# Patient Record
Sex: Female | Born: 1940
Health system: Southern US, Community
[De-identification: ages and names within clinical notes are randomized; demographics above are authoritative.]

## PROBLEM LIST (undated history)

## (undated) DIAGNOSIS — M545 Low back pain, unspecified: Secondary | ICD-10-CM

## (undated) DIAGNOSIS — Z5189 Encounter for other specified aftercare: Secondary | ICD-10-CM

## (undated) DIAGNOSIS — H269 Unspecified cataract: Secondary | ICD-10-CM

## (undated) DIAGNOSIS — T8859XA Other complications of anesthesia, initial encounter: Secondary | ICD-10-CM

## (undated) DIAGNOSIS — I493 Ventricular premature depolarization: Secondary | ICD-10-CM

## (undated) DIAGNOSIS — C801 Malignant (primary) neoplasm, unspecified: Secondary | ICD-10-CM

## (undated) DIAGNOSIS — H409 Unspecified glaucoma: Secondary | ICD-10-CM

## (undated) DIAGNOSIS — T4145XA Adverse effect of unspecified anesthetic, initial encounter: Secondary | ICD-10-CM

## (undated) DIAGNOSIS — K5730 Diverticulosis of large intestine without perforation or abscess without bleeding: Secondary | ICD-10-CM

## (undated) DIAGNOSIS — N816 Rectocele: Secondary | ICD-10-CM

## (undated) DIAGNOSIS — J189 Pneumonia, unspecified organism: Secondary | ICD-10-CM

## (undated) DIAGNOSIS — M8580 Other specified disorders of bone density and structure, unspecified site: Secondary | ICD-10-CM

## (undated) DIAGNOSIS — K5790 Diverticulosis of intestine, part unspecified, without perforation or abscess without bleeding: Secondary | ICD-10-CM

## (undated) DIAGNOSIS — M4306 Spondylolysis, lumbar region: Secondary | ICD-10-CM

## (undated) DIAGNOSIS — R51 Headache: Secondary | ICD-10-CM

## (undated) DIAGNOSIS — Z974 Presence of external hearing-aid: Secondary | ICD-10-CM

## (undated) DIAGNOSIS — F32A Depression, unspecified: Secondary | ICD-10-CM

## (undated) DIAGNOSIS — M1990 Unspecified osteoarthritis, unspecified site: Secondary | ICD-10-CM

## (undated) DIAGNOSIS — F329 Major depressive disorder, single episode, unspecified: Secondary | ICD-10-CM

## (undated) DIAGNOSIS — D126 Benign neoplasm of colon, unspecified: Secondary | ICD-10-CM

## (undated) DIAGNOSIS — R011 Cardiac murmur, unspecified: Secondary | ICD-10-CM

## (undated) DIAGNOSIS — T7840XA Allergy, unspecified, initial encounter: Secondary | ICD-10-CM

## (undated) DIAGNOSIS — G4730 Sleep apnea, unspecified: Secondary | ICD-10-CM

## (undated) DIAGNOSIS — K635 Polyp of colon: Secondary | ICD-10-CM

## (undated) DIAGNOSIS — I272 Pulmonary hypertension, unspecified: Secondary | ICD-10-CM

## (undated) DIAGNOSIS — R519 Headache, unspecified: Secondary | ICD-10-CM

## (undated) HISTORY — PX: TOTAL HIP ARTHROPLASTY: SHX124

## (undated) HISTORY — PX: TONSILLECTOMY: SUR1361

## (undated) HISTORY — DX: Encounter for other specified aftercare: Z51.89

## (undated) HISTORY — PX: CATARACT EXTRACTION, BILATERAL: SHX1313

## (undated) HISTORY — PX: ABDOMINAL HERNIA REPAIR: SHX539

## (undated) HISTORY — PX: JOINT REPLACEMENT: SHX530

## (undated) HISTORY — DX: Cardiac murmur, unspecified: R01.1

## (undated) HISTORY — DX: Sleep apnea, unspecified: G47.30

## (undated) HISTORY — PX: ABDOMINAL HYSTERECTOMY: SHX81

## (undated) HISTORY — DX: Unspecified osteoarthritis, unspecified site: M19.90

## (undated) HISTORY — DX: Unspecified cataract: H26.9

## (undated) HISTORY — DX: Benign neoplasm of colon, unspecified: D12.6

## (undated) HISTORY — DX: Diverticulosis of large intestine without perforation or abscess without bleeding: K57.30

## (undated) HISTORY — PX: PARTIAL KNEE ARTHROPLASTY: SHX2174

## (undated) HISTORY — DX: Spondylolysis, lumbar region: M43.06

## (undated) HISTORY — DX: Allergy, unspecified, initial encounter: T78.40XA

## (undated) HISTORY — DX: Major depressive disorder, single episode, unspecified: F32.9

## (undated) HISTORY — PX: COLONOSCOPY: SHX174

## (undated) HISTORY — DX: Rectocele: N81.6

## (undated) HISTORY — PX: LUMBAR FUSION: SHX111

## (undated) HISTORY — DX: Pulmonary hypertension, unspecified: I27.20

## (undated) HISTORY — DX: Other specified disorders of bone density and structure, unspecified site: M85.80

## (undated) HISTORY — PX: OTHER SURGICAL HISTORY: SHX169

## (undated) HISTORY — DX: Depression, unspecified: F32.A

## (undated) HISTORY — DX: Low back pain, unspecified: M54.50

## (undated) HISTORY — DX: Low back pain: M54.5

## (undated) HISTORY — PX: EYE MUSCLE SURGERY: SHX370

## (undated) HISTORY — DX: Unspecified glaucoma: H40.9

## (undated) HISTORY — DX: Polyp of colon: K63.5

## (undated) HISTORY — DX: Diverticulosis of intestine, part unspecified, without perforation or abscess without bleeding: K57.90

## (undated) HISTORY — PX: POLYPECTOMY: SHX149

---

## 1971-10-01 DIAGNOSIS — T7840XA Allergy, unspecified, initial encounter: Secondary | ICD-10-CM

## 1971-10-01 HISTORY — DX: Allergy, unspecified, initial encounter: T78.40XA

## 1974-09-30 HISTORY — PX: TUBAL LIGATION: SHX77

## 1996-09-30 DIAGNOSIS — H269 Unspecified cataract: Secondary | ICD-10-CM

## 1996-09-30 HISTORY — DX: Unspecified cataract: H26.9

## 1999-05-31 ENCOUNTER — Other Ambulatory Visit: Admission: RE | Admit: 1999-05-31 | Discharge: 1999-05-31 | Payer: Self-pay | Admitting: Gastroenterology

## 1999-05-31 ENCOUNTER — Encounter (INDEPENDENT_AMBULATORY_CARE_PROVIDER_SITE_OTHER): Payer: Self-pay

## 1999-09-11 ENCOUNTER — Other Ambulatory Visit: Admission: RE | Admit: 1999-09-11 | Discharge: 1999-09-11 | Payer: Self-pay | Admitting: *Deleted

## 2000-09-16 ENCOUNTER — Other Ambulatory Visit: Admission: RE | Admit: 2000-09-16 | Discharge: 2000-09-16 | Payer: Self-pay | Admitting: *Deleted

## 2000-10-28 ENCOUNTER — Encounter: Payer: Self-pay | Admitting: *Deleted

## 2000-10-28 ENCOUNTER — Encounter: Admission: RE | Admit: 2000-10-28 | Discharge: 2000-10-28 | Payer: Self-pay | Admitting: *Deleted

## 2000-11-18 ENCOUNTER — Other Ambulatory Visit: Admission: RE | Admit: 2000-11-18 | Discharge: 2000-11-18 | Payer: Self-pay | Admitting: *Deleted

## 2000-11-18 ENCOUNTER — Encounter (INDEPENDENT_AMBULATORY_CARE_PROVIDER_SITE_OTHER): Payer: Self-pay | Admitting: Specialist

## 2001-11-03 ENCOUNTER — Other Ambulatory Visit: Admission: RE | Admit: 2001-11-03 | Discharge: 2001-11-03 | Payer: Self-pay | Admitting: *Deleted

## 2001-12-23 ENCOUNTER — Encounter: Admission: RE | Admit: 2001-12-23 | Discharge: 2002-01-22 | Payer: Self-pay | Admitting: Internal Medicine

## 2002-11-10 ENCOUNTER — Other Ambulatory Visit: Admission: RE | Admit: 2002-11-10 | Discharge: 2002-11-10 | Payer: Self-pay | Admitting: *Deleted

## 2003-12-14 ENCOUNTER — Other Ambulatory Visit: Admission: RE | Admit: 2003-12-14 | Discharge: 2003-12-14 | Payer: Self-pay | Admitting: *Deleted

## 2004-01-24 ENCOUNTER — Encounter: Payer: Self-pay | Admitting: Internal Medicine

## 2004-10-02 ENCOUNTER — Ambulatory Visit: Payer: Self-pay | Admitting: Internal Medicine

## 2004-10-09 ENCOUNTER — Ambulatory Visit: Payer: Self-pay | Admitting: Internal Medicine

## 2005-01-28 ENCOUNTER — Ambulatory Visit: Payer: Self-pay | Admitting: Internal Medicine

## 2005-06-06 ENCOUNTER — Ambulatory Visit: Payer: Self-pay | Admitting: Internal Medicine

## 2005-07-04 ENCOUNTER — Ambulatory Visit: Payer: Self-pay | Admitting: Internal Medicine

## 2005-09-30 HISTORY — PX: COLON SURGERY: SHX602

## 2005-10-02 ENCOUNTER — Ambulatory Visit: Payer: Self-pay | Admitting: Internal Medicine

## 2005-10-07 ENCOUNTER — Ambulatory Visit: Payer: Self-pay | Admitting: Internal Medicine

## 2006-01-24 ENCOUNTER — Ambulatory Visit: Payer: Self-pay | Admitting: Internal Medicine

## 2006-01-28 ENCOUNTER — Encounter: Admission: RE | Admit: 2006-01-28 | Discharge: 2006-01-28 | Payer: Self-pay | Admitting: Internal Medicine

## 2006-03-26 ENCOUNTER — Ambulatory Visit: Payer: Self-pay | Admitting: Internal Medicine

## 2006-04-16 ENCOUNTER — Encounter: Admission: RE | Admit: 2006-04-16 | Discharge: 2006-04-16 | Payer: Self-pay | Admitting: Neurosurgery

## 2006-04-19 ENCOUNTER — Emergency Department (HOSPITAL_COMMUNITY): Admission: EM | Admit: 2006-04-19 | Discharge: 2006-04-19 | Payer: Self-pay | Admitting: Emergency Medicine

## 2006-04-22 ENCOUNTER — Encounter (INDEPENDENT_AMBULATORY_CARE_PROVIDER_SITE_OTHER): Payer: Self-pay | Admitting: *Deleted

## 2006-04-22 ENCOUNTER — Inpatient Hospital Stay (HOSPITAL_COMMUNITY): Admission: RE | Admit: 2006-04-22 | Discharge: 2006-05-03 | Payer: Self-pay | Admitting: *Deleted

## 2006-04-23 ENCOUNTER — Encounter (INDEPENDENT_AMBULATORY_CARE_PROVIDER_SITE_OTHER): Payer: Self-pay | Admitting: *Deleted

## 2006-05-08 ENCOUNTER — Ambulatory Visit: Payer: Self-pay | Admitting: Internal Medicine

## 2006-07-28 ENCOUNTER — Inpatient Hospital Stay (HOSPITAL_COMMUNITY): Admission: RE | Admit: 2006-07-28 | Discharge: 2006-08-04 | Payer: Self-pay | Admitting: *Deleted

## 2006-08-12 ENCOUNTER — Ambulatory Visit: Payer: Self-pay | Admitting: Internal Medicine

## 2006-09-01 ENCOUNTER — Ambulatory Visit: Payer: Self-pay | Admitting: Internal Medicine

## 2006-09-01 ENCOUNTER — Encounter: Admission: RE | Admit: 2006-09-01 | Discharge: 2006-09-01 | Payer: Self-pay | Admitting: Internal Medicine

## 2006-09-02 ENCOUNTER — Encounter: Admission: RE | Admit: 2006-09-02 | Discharge: 2006-09-02 | Payer: Self-pay | Admitting: Internal Medicine

## 2006-09-10 ENCOUNTER — Ambulatory Visit: Payer: Self-pay | Admitting: Internal Medicine

## 2006-09-16 ENCOUNTER — Ambulatory Visit: Payer: Self-pay | Admitting: Internal Medicine

## 2006-09-26 ENCOUNTER — Ambulatory Visit: Payer: Self-pay | Admitting: Internal Medicine

## 2006-10-02 ENCOUNTER — Ambulatory Visit: Payer: Self-pay | Admitting: Internal Medicine

## 2006-10-17 ENCOUNTER — Ambulatory Visit: Payer: Self-pay | Admitting: Internal Medicine

## 2006-11-20 ENCOUNTER — Ambulatory Visit: Payer: Self-pay | Admitting: Internal Medicine

## 2007-02-09 ENCOUNTER — Encounter
Admission: RE | Admit: 2007-02-09 | Discharge: 2007-05-10 | Payer: Self-pay | Admitting: Physical Medicine & Rehabilitation

## 2007-02-10 ENCOUNTER — Ambulatory Visit: Payer: Self-pay | Admitting: Physical Medicine & Rehabilitation

## 2007-02-11 ENCOUNTER — Ambulatory Visit: Payer: Self-pay | Admitting: Internal Medicine

## 2007-02-11 LAB — CONVERTED CEMR LAB
ALT: 22 units/L (ref 0–40)
AST: 20 units/L (ref 0–37)
Albumin: 3.9 g/dL (ref 3.5–5.2)
Alkaline Phosphatase: 74 units/L (ref 39–117)
Basophils Relative: 0.7 % (ref 0.0–1.0)
Bilirubin, Direct: 0.1 mg/dL (ref 0.0–0.3)
CO2: 32 meq/L (ref 19–32)
Calcium: 8.9 mg/dL (ref 8.4–10.5)
Eosinophils Relative: 5 % (ref 0.0–5.0)
GFR calc Af Amer: 107 mL/min
HCT: 38.3 % (ref 36.0–46.0)
LDL Cholesterol: 82 mg/dL (ref 0–99)
Lymphocytes Relative: 38.2 % (ref 12.0–46.0)
MCHC: 34.7 g/dL (ref 30.0–36.0)
Neutro Abs: 2.1 10*3/uL (ref 1.4–7.7)
Platelets: 153 10*3/uL (ref 150–400)
Potassium: 3.5 meq/L (ref 3.5–5.1)
TSH: 1.96 microintl units/mL (ref 0.35–5.50)
Total Bilirubin: 1 mg/dL (ref 0.3–1.2)
Total CHOL/HDL Ratio: 2.5

## 2007-02-26 DIAGNOSIS — M8580 Other specified disorders of bone density and structure, unspecified site: Secondary | ICD-10-CM | POA: Insufficient documentation

## 2007-03-02 ENCOUNTER — Ambulatory Visit: Payer: Self-pay | Admitting: Internal Medicine

## 2007-03-23 ENCOUNTER — Ambulatory Visit: Payer: Self-pay | Admitting: Physical Medicine & Rehabilitation

## 2007-04-16 ENCOUNTER — Encounter: Payer: Self-pay | Admitting: Internal Medicine

## 2007-05-08 ENCOUNTER — Encounter
Admission: RE | Admit: 2007-05-08 | Discharge: 2007-08-06 | Payer: Self-pay | Admitting: Physical Medicine & Rehabilitation

## 2007-05-11 ENCOUNTER — Ambulatory Visit: Payer: Self-pay | Admitting: Physical Medicine & Rehabilitation

## 2007-05-21 ENCOUNTER — Ambulatory Visit: Payer: Self-pay | Admitting: Internal Medicine

## 2007-05-21 DIAGNOSIS — J309 Allergic rhinitis, unspecified: Secondary | ICD-10-CM | POA: Insufficient documentation

## 2007-06-25 ENCOUNTER — Ambulatory Visit: Payer: Self-pay | Admitting: Physical Medicine & Rehabilitation

## 2007-07-22 ENCOUNTER — Ambulatory Visit: Payer: Self-pay | Admitting: Internal Medicine

## 2007-07-22 DIAGNOSIS — G4700 Insomnia, unspecified: Secondary | ICD-10-CM | POA: Insufficient documentation

## 2007-07-27 ENCOUNTER — Ambulatory Visit: Payer: Self-pay | Admitting: Physical Medicine & Rehabilitation

## 2007-09-01 ENCOUNTER — Telehealth: Payer: Self-pay | Admitting: Internal Medicine

## 2007-09-09 ENCOUNTER — Telehealth: Payer: Self-pay | Admitting: Internal Medicine

## 2007-10-01 HISTORY — PX: BACK SURGERY: SHX140

## 2007-10-08 ENCOUNTER — Ambulatory Visit: Payer: Self-pay | Admitting: Internal Medicine

## 2007-11-03 ENCOUNTER — Encounter: Payer: Self-pay | Admitting: Internal Medicine

## 2007-11-12 ENCOUNTER — Ambulatory Visit: Payer: Self-pay | Admitting: Internal Medicine

## 2007-12-07 ENCOUNTER — Telehealth: Payer: Self-pay | Admitting: Internal Medicine

## 2007-12-16 ENCOUNTER — Ambulatory Visit: Payer: Self-pay | Admitting: Internal Medicine

## 2007-12-21 ENCOUNTER — Encounter: Payer: Self-pay | Admitting: Internal Medicine

## 2008-01-18 ENCOUNTER — Ambulatory Visit: Payer: Self-pay | Admitting: Internal Medicine

## 2008-01-22 ENCOUNTER — Encounter: Admission: RE | Admit: 2008-01-22 | Discharge: 2008-01-22 | Payer: Self-pay | Admitting: Neurosurgery

## 2008-01-25 ENCOUNTER — Telehealth: Payer: Self-pay | Admitting: *Deleted

## 2008-01-25 DIAGNOSIS — M48061 Spinal stenosis, lumbar region without neurogenic claudication: Secondary | ICD-10-CM | POA: Insufficient documentation

## 2008-02-16 ENCOUNTER — Inpatient Hospital Stay (HOSPITAL_COMMUNITY): Admission: RE | Admit: 2008-02-16 | Discharge: 2008-02-17 | Payer: Self-pay | Admitting: Neurosurgery

## 2008-02-16 HISTORY — PX: SPINE SURGERY: SHX786

## 2008-02-19 ENCOUNTER — Telehealth: Payer: Self-pay | Admitting: Internal Medicine

## 2008-02-24 ENCOUNTER — Encounter: Payer: Self-pay | Admitting: Internal Medicine

## 2008-03-10 ENCOUNTER — Ambulatory Visit: Payer: Self-pay | Admitting: Internal Medicine

## 2008-03-16 ENCOUNTER — Encounter: Admission: RE | Admit: 2008-03-16 | Discharge: 2008-03-16 | Payer: Self-pay | Admitting: Neurosurgery

## 2008-03-16 ENCOUNTER — Encounter: Payer: Self-pay | Admitting: Internal Medicine

## 2008-04-21 ENCOUNTER — Ambulatory Visit: Payer: Self-pay | Admitting: Internal Medicine

## 2008-04-21 DIAGNOSIS — K5909 Other constipation: Secondary | ICD-10-CM | POA: Insufficient documentation

## 2008-05-04 ENCOUNTER — Encounter: Payer: Self-pay | Admitting: Internal Medicine

## 2008-05-17 ENCOUNTER — Encounter: Payer: Self-pay | Admitting: Internal Medicine

## 2008-05-17 ENCOUNTER — Encounter: Admission: RE | Admit: 2008-05-17 | Discharge: 2008-05-17 | Payer: Self-pay | Admitting: Neurosurgery

## 2008-06-13 ENCOUNTER — Encounter: Admission: RE | Admit: 2008-06-13 | Discharge: 2008-06-13 | Payer: Self-pay | Admitting: Orthopedic Surgery

## 2008-06-16 ENCOUNTER — Ambulatory Visit: Payer: Self-pay | Admitting: Internal Medicine

## 2008-06-16 LAB — CONVERTED CEMR LAB
ALT: 41 units/L — ABNORMAL HIGH (ref 0–35)
Albumin: 4.1 g/dL (ref 3.5–5.2)
Basophils Relative: 0.1 % (ref 0.0–3.0)
Bilirubin Urine: NEGATIVE
Blood in Urine, dipstick: NEGATIVE
Calcium: 9.6 mg/dL (ref 8.4–10.5)
Cholesterol: 149 mg/dL (ref 0–200)
Creatinine, Ser: 0.7 mg/dL (ref 0.4–1.2)
Eosinophils Absolute: 0.1 10*3/uL (ref 0.0–0.7)
Eosinophils Relative: 2.1 % (ref 0.0–5.0)
GFR calc non Af Amer: 88 mL/min
Glucose, Bld: 76 mg/dL (ref 70–99)
HCT: 41.3 % (ref 36.0–46.0)
Hemoglobin: 13.7 g/dL (ref 12.0–15.0)
Ketones, urine, test strip: NEGATIVE
LDL Cholesterol: 59 mg/dL (ref 0–99)
MCV: 89.5 fL (ref 78.0–100.0)
Nitrite: NEGATIVE
RDW: 14.4 % (ref 11.5–14.6)
Total Bilirubin: 1 mg/dL (ref 0.3–1.2)
Triglycerides: 93 mg/dL (ref 0–149)
Urobilinogen, UA: 0.2
WBC: 5.1 10*3/uL (ref 4.5–10.5)
pH: 6.5

## 2008-06-24 ENCOUNTER — Ambulatory Visit: Payer: Self-pay | Admitting: Internal Medicine

## 2008-06-24 DIAGNOSIS — Z96641 Presence of right artificial hip joint: Secondary | ICD-10-CM | POA: Insufficient documentation

## 2008-07-22 ENCOUNTER — Ambulatory Visit: Payer: Self-pay | Admitting: Internal Medicine

## 2008-08-15 DIAGNOSIS — Z5189 Encounter for other specified aftercare: Secondary | ICD-10-CM

## 2008-08-15 HISTORY — DX: Encounter for other specified aftercare: Z51.89

## 2008-08-17 ENCOUNTER — Telehealth: Payer: Self-pay | Admitting: Internal Medicine

## 2008-08-18 ENCOUNTER — Encounter: Payer: Self-pay | Admitting: Internal Medicine

## 2008-09-05 ENCOUNTER — Telehealth: Payer: Self-pay | Admitting: Internal Medicine

## 2008-09-13 ENCOUNTER — Ambulatory Visit: Payer: Self-pay | Admitting: Internal Medicine

## 2008-10-21 ENCOUNTER — Ambulatory Visit: Payer: Self-pay | Admitting: Internal Medicine

## 2008-11-09 ENCOUNTER — Telehealth: Payer: Self-pay | Admitting: Internal Medicine

## 2008-12-27 ENCOUNTER — Telehealth: Payer: Self-pay | Admitting: Internal Medicine

## 2009-01-13 DIAGNOSIS — D126 Benign neoplasm of colon, unspecified: Secondary | ICD-10-CM | POA: Insufficient documentation

## 2009-01-13 DIAGNOSIS — K5730 Diverticulosis of large intestine without perforation or abscess without bleeding: Secondary | ICD-10-CM

## 2009-01-13 HISTORY — DX: Diverticulosis of large intestine without perforation or abscess without bleeding: K57.30

## 2009-01-16 ENCOUNTER — Ambulatory Visit: Payer: Self-pay | Admitting: Internal Medicine

## 2009-01-17 ENCOUNTER — Encounter (INDEPENDENT_AMBULATORY_CARE_PROVIDER_SITE_OTHER): Payer: Self-pay | Admitting: *Deleted

## 2009-01-18 ENCOUNTER — Ambulatory Visit: Payer: Self-pay | Admitting: Internal Medicine

## 2009-01-23 ENCOUNTER — Ambulatory Visit: Payer: Self-pay | Admitting: Internal Medicine

## 2009-01-23 LAB — CONVERTED CEMR LAB
ALT: 17 units/L (ref 0–35)
AST: 17 units/L (ref 0–37)
Alkaline Phosphatase: 65 units/L (ref 39–117)
BUN: 14 mg/dL (ref 6–23)
Bilirubin, Direct: 0.1 mg/dL (ref 0.0–0.3)
Chloride: 107 meq/L (ref 96–112)
Creatinine, Ser: 0.7 mg/dL (ref 0.4–1.2)
Eosinophils Absolute: 0.1 10*3/uL (ref 0.0–0.7)
Eosinophils Relative: 3.1 % (ref 0.0–5.0)
GFR calc non Af Amer: 88.15 mL/min (ref >60)
HCT: 39.9 % (ref 36.0–46.0)
Hemoglobin: 13.9 g/dL (ref 12.0–15.0)
Lymphocytes Relative: 42.4 % (ref 12.0–46.0)
Monocytes Relative: 6.9 % (ref 3.0–12.0)
Neutrophils Relative %: 44.3 % (ref 43.0–77.0)
Platelets: 132 10*3/uL — ABNORMAL LOW (ref 150.0–400.0)
Potassium: 4.4 meq/L (ref 3.5–5.1)
RDW: 14.4 % (ref 11.5–14.6)
Sodium: 144 meq/L (ref 135–145)
Total Bilirubin: 1.1 mg/dL (ref 0.3–1.2)
Triglycerides: 78 mg/dL (ref 0.0–149.0)
WBC: 3.9 10*3/uL — ABNORMAL LOW (ref 4.5–10.5)

## 2009-01-26 ENCOUNTER — Encounter: Payer: Self-pay | Admitting: Internal Medicine

## 2009-01-26 ENCOUNTER — Ambulatory Visit: Payer: Self-pay | Admitting: Internal Medicine

## 2009-01-26 LAB — HM COLONOSCOPY

## 2009-01-28 ENCOUNTER — Encounter: Payer: Self-pay | Admitting: Internal Medicine

## 2009-03-20 ENCOUNTER — Ambulatory Visit: Payer: Self-pay | Admitting: Internal Medicine

## 2009-03-20 LAB — CONVERTED CEMR LAB
CO2: 30 meq/L (ref 19–32)
Glucose, Bld: 79 mg/dL (ref 70–99)
Potassium: 4.3 meq/L (ref 3.5–5.1)
Sodium: 139 meq/L (ref 135–145)

## 2009-04-02 ENCOUNTER — Encounter: Payer: Self-pay | Admitting: Internal Medicine

## 2009-04-03 ENCOUNTER — Ambulatory Visit: Admission: AD | Admit: 2009-04-03 | Discharge: 2009-04-03 | Payer: Self-pay | Admitting: Ophthalmology

## 2009-04-04 ENCOUNTER — Telehealth: Payer: Self-pay | Admitting: Internal Medicine

## 2009-04-05 ENCOUNTER — Ambulatory Visit (HOSPITAL_COMMUNITY): Admission: RE | Admit: 2009-04-05 | Discharge: 2009-04-05 | Payer: Self-pay | Admitting: Ophthalmology

## 2009-04-05 ENCOUNTER — Telehealth (INDEPENDENT_AMBULATORY_CARE_PROVIDER_SITE_OTHER): Payer: Self-pay | Admitting: *Deleted

## 2009-04-05 ENCOUNTER — Encounter: Payer: Self-pay | Admitting: Internal Medicine

## 2009-04-06 ENCOUNTER — Telehealth: Payer: Self-pay | Admitting: Internal Medicine

## 2009-05-26 ENCOUNTER — Encounter: Payer: Self-pay | Admitting: Internal Medicine

## 2009-06-26 ENCOUNTER — Ambulatory Visit: Payer: Self-pay | Admitting: Internal Medicine

## 2009-09-11 ENCOUNTER — Ambulatory Visit: Payer: Self-pay | Admitting: Internal Medicine

## 2009-12-11 ENCOUNTER — Ambulatory Visit: Payer: Self-pay | Admitting: Internal Medicine

## 2009-12-11 DIAGNOSIS — Z96652 Presence of left artificial knee joint: Secondary | ICD-10-CM | POA: Insufficient documentation

## 2009-12-11 DIAGNOSIS — H5000 Unspecified esotropia: Secondary | ICD-10-CM | POA: Insufficient documentation

## 2009-12-19 ENCOUNTER — Encounter: Payer: Self-pay | Admitting: Internal Medicine

## 2009-12-20 ENCOUNTER — Encounter: Payer: Self-pay | Admitting: Internal Medicine

## 2009-12-25 ENCOUNTER — Encounter: Payer: Self-pay | Admitting: Internal Medicine

## 2010-01-03 ENCOUNTER — Encounter: Payer: Self-pay | Admitting: Internal Medicine

## 2010-03-12 ENCOUNTER — Ambulatory Visit: Payer: Self-pay | Admitting: Internal Medicine

## 2010-05-18 ENCOUNTER — Encounter: Payer: Self-pay | Admitting: Internal Medicine

## 2010-05-28 ENCOUNTER — Encounter: Payer: Self-pay | Admitting: Internal Medicine

## 2010-08-01 ENCOUNTER — Ambulatory Visit: Payer: Self-pay | Admitting: Internal Medicine

## 2010-08-01 LAB — CONVERTED CEMR LAB
Albumin: 4 g/dL (ref 3.5–5.2)
Bilirubin, Direct: 0.1 mg/dL (ref 0.0–0.3)
CO2: 31 meq/L (ref 19–32)
Calcium: 9.1 mg/dL (ref 8.4–10.5)
Chloride: 105 meq/L (ref 96–112)
Cholesterol: 157 mg/dL (ref 0–200)
Creatinine, Ser: 0.7 mg/dL (ref 0.4–1.2)
HDL: 69.1 mg/dL (ref >39.00)
LDL Cholesterol: 75 mg/dL (ref 0–99)
Sodium: 142 meq/L (ref 135–145)
Total CHOL/HDL Ratio: 2
VLDL: 13 mg/dL (ref 0.0–40.0)

## 2010-08-29 ENCOUNTER — Telehealth: Payer: Self-pay | Admitting: Internal Medicine

## 2010-10-06 ENCOUNTER — Encounter: Payer: Self-pay | Admitting: Internal Medicine

## 2010-10-22 ENCOUNTER — Encounter: Payer: Self-pay | Admitting: Interventional Radiology

## 2010-10-22 ENCOUNTER — Other Ambulatory Visit: Payer: Self-pay | Admitting: Dermatology

## 2010-11-01 NOTE — Letter (Signed)
Summary: [facility]  [facility]   Imported By: Maryln Gottron 12/22/2009 15:43:50  _____________________________________________________________________  External Attachment:    Type:   Image     Comment:   External Document

## 2010-11-01 NOTE — Medication Information (Signed)
Summary: Coverage Approval for Zolpidem  Coverage Approval for Zolpidem   Imported By: Maryln Gottron 03/20/2010 15:28:11  _____________________________________________________________________  External Attachment:    Type:   Image     Comment:   External Document

## 2010-11-01 NOTE — Letter (Signed)
Summary: Letter from patient with concerns about surgery   Letter from patient with concerns about surgery   Imported By: Maryln Gottron 05/28/2010 13:20:04  _____________________________________________________________________  External Attachment:    Type:   Image     Comment:   External Document

## 2010-11-01 NOTE — Miscellaneous (Signed)
Summary: [facility]  [place] [facility]   Imported By: Maryln Gottron 01/16/2010 12:27:54  _____________________________________________________________________  External Attachment:    Type:   Image     Comment:   External Document

## 2010-11-01 NOTE — Letter (Signed)
Summary: MEDICARE QUESTIONNAIRE  MEDICARE QUESTIONNAIRE   Imported By: Georgian Co 08/01/2010 12:07:17  _____________________________________________________________________  External Attachment:    Type:   Image     Comment:   External Document

## 2010-11-01 NOTE — Letter (Signed)
Summary: [facility]  [facility]   Imported By: Maryln Gottron 01/08/2010 09:11:38  _____________________________________________________________________  External Attachment:    Type:   Image     Comment:   External Document

## 2010-11-01 NOTE — Medication Information (Signed)
Summary: PA and Approval for Zolpidem  PA and Approval for Zolpidem   Imported By: Maryln Gottron 10/16/2010 09:15:07  _____________________________________________________________________  External Attachment:    Type:   Image     Comment:   External Document

## 2010-11-01 NOTE — Assessment & Plan Note (Signed)
Summary: pt will come in fasting/njr---PT [facility] (BMP) // RS/pt rescd fro...   Vital Signs:  Patient profile:   70 year old female Height:      62.5 inches Weight:      140 pounds BMI:     25.29 Temp:     98.2 degrees F oral Pulse rate:   72 / minute Resp:     14 per minute BP sitting:   110 / 70  (left arm)  Vitals Entered By: Willy Eddy, LPN (August 01, 2010 10:15 AM) CC: annual visit for disease management-fasting this am- will make appointment with dr Henderson Cloud for pap, Hypertension Management Is Patient Diabetic? No   Primary Care Provider:  Peri Jefferson  CC:  annual visit for disease management-fasting this am- will make appointment with dr Henderson Cloud for pap and Hypertension Management.  History of Present Illness: medicare welness visit  and follow up for HTN and lipds and chronic pain  I have personally reviewed the Medicare Annual Wellness questionnaire and have noted 1.   The patient's medical and social history 2.   Their use of alcohol, tobacco or illicit drugs 3.   Their current medications and supplements 4.   The patient's functional ability including ADL's, fall risks, home safety risks and hearing or visual             impairment. 5.   Diet and physical activities 6.   Evidence for depression or mood disorders The patients weight, height, BMI and visual acuity have been recorded in the chart I have made referrals, counseling and provided education to the patient based review of the above and I have provided the pt with a written personalized care plan for preventive services.    Hypertension History:      She denies headache, chest pain, palpitations, dyspnea with exertion, orthopnea, PND, peripheral edema, visual symptoms, neurologic problems, syncope, and side effects from treatment.        Positive major cardiovascular risk factors include female age 26 years old or older, hyperlipidemia, and hypertension.  Negative major cardiovascular risk factors  include non-tobacco-user status.     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) 6)  Constipation  (ICD-564.00) 7)  Diverticulitis of Colon  (ICD-562.11) 8)  Colonic Polyps  (ICD-211.3) 9)  Diverticulosis, Colon  (ICD-562.10) 10)  Preventive Health Care  (ICD-V70.0) 11)  Hip Pain, Right, Chronic  (ICD-719.45) 12)  Constipation, Drug Induced  (ICD-564.09) 13)  Spinal Stenosis, Lumbar  (ICD-724.02) 14)  Acute Bronchitis  (ICD-466.0) 15)  Insomnia, Chronic  (ICD-307.42) 16)  Family History Breast Cancer 1st Degree Relative <50  (ICD-V16.3) 17)  Family History Diabetes 1st Degree Relative  (ICD-V18.0) 18)  Allergic Rhinitis  (ICD-477.9) 19)  Spondylolisthesis  (ICD-756.12) 20)  Low Back Pain  (ICD-724.2) 21)  Osteoporosis  (ICD-733.00) 22)  Hypertension  (ICD-401.9) 23)  Headache  (ICD-784.0)  Current Problems (verified): 1)  Esotropia, 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) 2)  Loc Osteoarthros Not Spec Prim/sec Lower Leg  (ICD-715.36) 3)  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) 4)  Tiredness  (ICD-780.79) 5)  Hypercholesterolemia, Mild  (ICD-272.0) 6)  Constipation  (ICD-564.00) 7)  Diverticulitis of  Colon  (ICD-562.11) 8)  Colonic Polyps  (ICD-211.3) 9)  Diverticulosis, Colon  (ICD-562.10) 10)  Preventive Health Care  (ICD-V70.0) 11)  Hip Pain, Right, Chronic  (ICD-719.45) 12)  Constipation, Drug Induced  (ICD-564.09) 13)  Spinal Stenosis, Lumbar  (ICD-724.02) 14)  Acute Bronchitis  (ICD-466.0) 15)  Insomnia, Chronic  (ICD-307.42) 16)  Family History Breast Cancer 1st Degree Relative <50  (ICD-V16.3) 17)  Family History Diabetes 1st Degree Relative   (ICD-V18.0) 18)  Allergic Rhinitis  (ICD-477.9) 19)  Spondylolisthesis  (ICD-756.12) 20)  Low Back Pain  (ICD-724.2) 21)  Osteoporosis  (ICD-733.00) 22)  Hypertension  (ICD-401.9) 23)  Headache  (ICD-784.0)  Medications Prior to Update: 1)  Cymbalta 60 Mg Cpep (Duloxetine Hcl) .... Take 1 Capsule By Mouth Once A Day 2)  Flonase 50 Mcg/act Susp (Fluticasone Propionate) .... Spray 1 Spray Into Both Nostrils Once A Day 3)  Sm Calcium/vitamin D 500-200 Mg-Unit Tabs (Calcium Carbonate-Vitamin D) .... Take 1 Tablet By Mouth Three Times A Day 4)  Vivelle 0.05 Mg/24hr Pttw (Estradiol) .... Apply 1 To Skin Twice A Week 5)  Zanaflex 2 Mg Caps (Tizanidine Hcl) .Marland Kitchen.. 1 At Bedtime As Needed 6)  Lyrica 75 Mg  Caps (Pregabalin) .... By Mouth Bid 7)  Optivar 0.05 %  Soln (Azelastine Hcl) .Marland Kitchen.. 1 Drop in Both Eyes Two Times A Day 8)  Miralax  Powd (Polyethylene Glycol 3350) .Marland KitchenMarland KitchenMarland Kitchen 17 Gr Daily As Needed 9)  Dulcolax 5 Mg Tbec (Bisacodyl) .Marland Kitchen.. 1-4 Tablet By Mouth Two Times A Day 10)  Align  Caps (Misc Intestinal Flora Regulat) .Marland Kitchen.. 1 By Mouth Once Daily 11)  Senokot 8.6 Mg Tabs (Sennosides) .... 2 By Mouth As Needed Every Week or Two 12)  Multivitamins   Tabs (Multiple Vitamin) .... 2 By Mouth Two Times A Day 13)  Viactiv 500-500-40 Mg-Unt-Mcg Chew (Calcium-Vitamin D-Vitamin K) .... 1000mg  Once Daily 14)  Omega 3 1000mg  .... 2 By Mouth Once Daily 15)  Vitamin D 1000 Unit Tabs (Cholecalciferol) .Marland Kitchen.. 1 By Mouth Once Daily 16)  Preservision/lutein  Caps (Multiple Vitamins-Minerals) .Marland Kitchen.. 1 By Mouth Once Daily 17)  Zolpidem Tartrate 5 Mg Tabs (Zolpidem Tartrate) .... One By Mouth Q Hs As Needed 18)  Excedrin Extra Strength 250-250-65 Mg Tabs (Aspirin-Acetaminophen-Caffeine) .... 2 As Needed 19)  Mobic 15 Mg Tabs (Meloxicam) .Marland Kitchen.. 1 Once Daily  Current Medications (verified): 1)  Cymbalta 60 Mg Cpep (Duloxetine Hcl) .... Take 1 Capsule By Mouth Once A Day 2)  Flonase 50 Mcg/act Susp (Fluticasone Propionate) ....  Spray 1 Spray Into Both Nostrils Once A Day 3)  Sm Calcium/vitamin D 500-200 Mg-Unit Tabs (Calcium Carbonate-Vitamin D) .... Take 1 Tablet By Mouth Three Times A Day 4)  Vivelle 0.05 Mg/24hr Pttw (Estradiol) .... Apply 1 To Skin Twice A Week 5)  Zanaflex 2 Mg Caps (Tizanidine Hcl) .Marland Kitchen.. 1 At Bedtime As Needed 6)  Lyrica 75 Mg  Caps (Pregabalin) .... By Mouth Bid 7)  Optivar 0.05 %  Soln (Azelastine Hcl) .Marland Kitchen.. 1 Drop in Both Eyes Two Times A Day 8)  Miralax  Powd (Polyethylene Glycol 3350) .Marland KitchenMarland KitchenMarland Kitchen 17 Gr Daily As Needed 9)  Dulcolax 5 Mg Tbec (Bisacodyl) .Marland Kitchen.. 1-4 Tablet By Mouth Two Times A Day 10)  Align  Caps (Misc Intestinal Flora Regulat) .Marland Kitchen.. 1 By Mouth Once Daily 11)  Senokot 8.6 Mg Tabs (Sennosides) .... 2 By Mouth As Needed Every Week or Two 12)  Multivitamins   Tabs (Multiple Vitamin) .... 2 By Mouth Two Times  A Day 13)  Omega 3 1000mg  .... 2 By Mouth Once Daily 14)  Vitamin D 1000 Unit Tabs (Cholecalciferol) .Marland Kitchen.. 1 By Mouth Once Daily 15)  Preservision/lutein  Caps (Multiple Vitamins-Minerals) .Marland Kitchen.. 1 By Mouth Once Daily 16)  Zolpidem Tartrate 5 Mg Tabs (Zolpidem Tartrate) .... One By Mouth Q Hs As Needed 17)  Excedrin Extra Strength 250-250-65 Mg Tabs (Aspirin-Acetaminophen-Caffeine) .... 2 As Needed 18)  Ferrex 150 150 Mg Caps (Polysaccharide Iron Complex) .Marland Kitchen.. 1 Once Daily 19)  Aspirin 325 Mg Tabs (Aspirin)  Allergies (verified): No Known Drug Allergies  Directives (verified): 1)  Power of Health Care Att Husband   Past History:  Family History: Last updated: 01/18/2009 alzheimers Family History Diabetes 1st degree relative Family History Breast cancer 1st degree relative <50 Family History of Heart Disease: father No FH of Colon Cancer:  Social History: Last updated: 01/18/2009 Occupation: Radio producer Married Never Smoked Alcohol Use - yes wine 1 glass Daily Caffeine Use 1-2 sodas a day some coffee Illicit Drug Use - no  Risk Factors: Smoking Status: never  (08/01/2010)  Past medical, surgical, family and social histories (including risk factors) reviewed, and no changes noted (except as noted below).  Past Medical History: Reviewed history from 01/18/2009 and no changes required. Headache Hypertension Osteoporosis constipation Low back pain lumbar spondylolisthesis with likely facet plus discogenic pain hip resurfacing at [facility] 2009 Arthritis Chronic Headaches Depression Diverticulosis  Past Surgical History: Reviewed history from 01/18/2009 and no changes required. 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 alzheimers Family History Diabetes 1st degree relative Family History Breast cancer 1st degree relative <50 Family History of Heart Disease: father No FH of Colon Cancer:  Social History: Reviewed history from 01/18/2009 and no changes required. Occupation: Radio producer Married Never Smoked Alcohol Use - yes wine 1 glass Daily Caffeine Use 1-2 sodas a day some coffee Illicit Drug Use - no  Review of Systems  The patient denies anorexia, fever, weight loss, weight gain, vision loss, decreased hearing, hoarseness, chest pain, syncope, dyspnea on exertion, peripheral edema, prolonged cough, headaches, hemoptysis, abdominal pain, melena, hematochezia, severe indigestion/heartburn, hematuria, incontinence, genital sores, muscle weakness, suspicious skin lesions, transient blindness, difficulty walking, depression, unusual weight change, abnormal bleeding, enlarged lymph nodes, angioedema, and breast masses.         Flu Vaccine Consent Questions     Do you have a history of severe allergic reactions to this vaccine? no    Any prior history of allergic reactions to egg and/or gelatin?  no    Do you have a sensitivity to the preservative Thimersol? no    Do you have a past history of Guillan-Barre Syndrome? no    Do you currently have an acute febrile illness? no    Have you ever had a severe reaction to latex? no    Vaccine information given and explained to patient? yes    Are you currently pregnant? no    Lot Number:[id number]   Exp Date:03/30/2011   Site Given  Left Deltoid IM   Physical Exam  General:  Well developed, well nourished, no acute distress. Head:  Normocephalic and atraumatic. Eyes:  no icterus.pupils reactive to light.   Ears:  R ear normal and L ear normal.   Nose:  no external deformity and no nasal discharge.   Neck:  Supple; no masses or thyromegaly. Lungs:  normal respiratory effort and no wheezes.   Heart:  normal rate and regular rhythm.   Abdomen:  soft and non-tender.   Msk:  normal ROM.   Extremities:  No clubbing, cyanosis, edema, or deformity noted with normal full range of motion of all joints.   Neurologic:  alert & oriented X3 and cranial nerves II-XII intact.     Impression & Recommendations:  Problem # 1:  PREVENTIVE HEALTH CARE (ICD-V70.0)  The pt was asked about all immunizations, health maint. services that are appropriate to their age and was given guidance on diet exercize  and weight management  Colonoscopy: Location:  [facility].   (01/26/2009) Td Booster: Tdap (08/01/2010)   Flu Vax: Fluvax 3+ (08/01/2010)   Pneumovax: Historical (12/30/2005) Chol: 167 (01/23/2009)   HDL: 66.30 (01/23/2009)   LDL: 85 (01/23/2009)   TG: 78.0 (01/23/2009) TSH: 2.21 (03/20/2009)   Next Colonoscopy due:: 01/2014 (01/26/2009)  Discussed using sunscreen, use of alcohol, drug use, self breast exam, routine dental care, routine eye care, schedule for GYN exam, routine physical exam, seat belts, multiple vitamins, osteoporosis prevention, adequate calcium intake in diet, recommendations for immunizations, mammograms and Pap  smears.  Discussed exercise and checking cholesterol.  Discussed gun safety, safe sex, and contraception.  Orders: Medicare -1st Annual Wellness Visit [phone])  Problem # 2:  HYPERCHOLESTEROLEMIA, MILD (ICD-272.0) monitering Orders: TLB-Lipid Panel (80061-LIPID)  Labs Reviewed: SGOT: 17 (01/23/2009)   SGPT: 17 (01/23/2009)  10 Yr Risk Heart Disease: 3 % Prior 10 Yr Risk Heart Disease: 5 % (03/12/2010)   HDL:66.30 (01/23/2009), 71.5 (06/16/2008)  LDL:85 (01/23/2009), 59 (06/16/2008)  Chol:167 (01/23/2009), 149 (06/16/2008)  Trig:78.0 (01/23/2009), 93 (06/16/2008)  Problem # 3:  SPINAL STENOSIS, LUMBAR (ICD-724.02) pain controll adequate, exercize  Problem # 4:  HYPERTENSION (ICD-401.9) good pblood pressure control BP today: 110/70 Prior BP: 120/70 (03/12/2010)  10 Yr Risk Heart Disease: 3 % Prior 10 Yr Risk Heart Disease: 5 % (03/12/2010)  Labs Reviewed: K+: 4.3 (03/20/2009) Creat: : 0.7 (03/20/2009)   Chol: 167 (01/23/2009)   HDL: 66.30 (01/23/2009)   LDL: 85 (01/23/2009)   TG: 78.0 (01/23/2009)  Complete Medication List: 1)  Cymbalta 60 Mg Cpep (Duloxetine hcl) .... Take 1 capsule by mouth once a day 2)  Flonase 50 Mcg/act Susp (Fluticasone propionate) .... Spray 1 spray into both nostrils once a day 3)  Sm Calcium/vitamin D 500-200 Mg-unit Tabs (Calcium carbonate-vitamin d) .... Take 1 tablet by mouth three times a day 4)  Vivelle 0.05 Mg/24hr Pttw (Estradiol) .... Apply 1 to skin twice a week 5)  Zanaflex 2 Mg Caps (Tizanidine hcl) .Marland Kitchen.. 1 at bedtime as needed 6)  Lyrica 75 Mg Caps (Pregabalin) .... By mouth bid 7)  Optivar 0.05 % Soln (Azelastine hcl) .Marland Kitchen.. 1 drop in both eyes two times a day 8)  Miralax Powd (Polyethylene glycol 3350) .Marland KitchenMarland KitchenMarland Kitchen 17 gr daily as needed 9)  Dulcolax 5 Mg Tbec (Bisacodyl) .Marland Kitchen.. 1-4 tablet by mouth two times a day 10)  Align Caps (Misc intestinal flora regulat) .Marland Kitchen.. 1 by mouth once daily 11)  Senokot 8.6 Mg Tabs (Sennosides) .... 2 by mouth as needed  every week or two 12)  Multivitamins Tabs (Multiple vitamin) .... 2 by mouth two times a day 13)  Omega 3 1000mg   .... 2 by mouth once daily 14)  Vitamin D 1000 Unit Tabs (Cholecalciferol) .Marland Kitchen.. 1 by mouth once daily 15)  Preservision/lutein Caps (Multiple vitamins-minerals) .Marland Kitchen.. 1 by  mouth once daily 16)  Zolpidem Tartrate 5 Mg Tabs (Zolpidem tartrate) .... One by mouth q hs as needed 17)  Excedrin Extra Strength 250-250-65 Mg Tabs (Aspirin-acetaminophen-caffeine) .... 2 as needed 18)  Ferrex 150 150 Mg Caps (Polysaccharide iron complex) .Marland Kitchen.. 1 once daily 19)  Aspirin 325 Mg Tabs (Aspirin)  Other Orders: Flu Vaccine 20yrs + MEDICARE PATIENTS (Z6109) Administration Flu vaccine - MCR (G0008) Tdap => 9yrs IM (60454) Admin 1st Vaccine (09811) TLB-Hepatic/Liver Function Pnl (80076-HEPATIC) TLB-CMP (Comprehensive Metabolic Pnl) (80053-COMP)  Hypertension Assessment/Plan:      The patient's hypertensive risk group is category B: At least one risk factor (excluding diabetes) with no target organ damage.  Her calculated 10 year risk of coronary heart disease is 3 %.  Today's blood pressure is 110/70.  Her blood pressure goal is < 140/90.  Patient Instructions: 1)  Please schedule a follow-up appointment in 4 months. 2)  I have provided you with a copy of your personalized plan for preventive services. Please take the time to review along with your updated medication list.   Orders Added: 1)  Flu Vaccine 51yrs + MEDICARE PATIENTS [Q2039] 2)  Administration Flu vaccine - MCR [G0008] 3)  Tdap => 35yrs IM [90715] 4)  Admin 1st Vaccine [90471] 5)  TLB-Lipid Panel [80061-LIPID] 6)  TLB-Hepatic/Liver Function Pnl [80076-HEPATIC] 7)  TLB-CMP (Comprehensive Metabolic Pnl) [80053-COMP] 8)  Medicare -1st Annual Wellness Visit [G0438] 9)  Est. Patient Level III [91478]   Immunizations Administered:  Tetanus Vaccine:    Vaccine Type: Tdap    Site: right deltoid    Mfr: GlaxoSmithKline    Dose: 0.5  ml    Route: IM    Given by: Willy Eddy, LPN    Exp. Date: 07/19/2012    Lot #: [id number]    VIS given: 08/17/08 version given August 01, 2010.   Immunizations Administered:  Tetanus Vaccine:    Vaccine Type: Tdap    Site: right deltoid    Mfr: GlaxoSmithKline    Dose: 0.5 ml    Route: IM    Given by: Willy Eddy, LPN    Exp. Date: 07/19/2012    Lot #: [id number]    VIS given: 08/17/08 version given August 01, 2010.  Prevention & Chronic Care Immunizations   Influenza vaccine: Fluvax 3+  (08/01/2010)    Tetanus booster: 08/01/2010: Tdap    Pneumococcal vaccine: Historical  (12/30/2005)   Pneumococcal vaccine deferral: Not indicated  (08/01/2010)    H. zoster vaccine: 10/08/2007: Zostavax  Colorectal Screening   Hemoccult: Not documented    Colonoscopy: Location:  [facility].    (01/26/2009)   Colonoscopy due: 01/2014  Other Screening   Pap smear: Not documented    Mammogram: Not documented    DXA bone density scan: Not documented   Smoking status: never  (08/01/2010)  Lipids   Total Cholesterol: 167  (01/23/2009)   Lipid panel action/deferral: Lipid Panel ordered   LDL: 85  (01/23/2009)   LDL Direct: Not documented   HDL: 66.30  (01/23/2009)   Triglycerides: 78.0  (01/23/2009)    SGOT (AST): 17  (01/23/2009)   BMP action: Ordered   SGPT (ALT): 17  (01/23/2009) CMP ordered    Alkaline phosphatase: 65  (01/23/2009)   Total bilirubin: 1.1  (01/23/2009)    Lipid flowsheet reviewed?: Yes   Progress toward LDL goal: At goal  Hypertension   Last Blood Pressure: 110 / 70  (08/01/2010)   Serum creatinine:  0.7  (03/20/2009)   BMP action: Ordered   Serum potassium 4.3  (03/20/2009) CMP ordered     Hypertension flowsheet reviewed?: Yes   Progress toward BP goal: At goal  Self-Management Support :    Patient will work on the following items until the next clinic visit to reach self-care goals:     Medications and  monitoring: take my medicines every day  (08/01/2010)     Eating: eat more vegetables  (08/01/2010)     Activity: take a 30 minute walk every day  (08/01/2010)    Hypertension self-management support: BP self-monitoring log  (08/01/2010)    Lipid self-management support: Lipid monitoring log  (08/01/2010)    Appended Document: Orders Update    Clinical Lists Changes  Orders: Added new Test order of TLB-BMP (Basic Metabolic Panel-BMET) (80048-METABOL) - Signed Added new Service order of Venipuncture (04540) - Signed Added new Service order of Specimen Handling (98119) - Signed

## 2010-11-01 NOTE — Letter (Signed)
Summary: [facility]  [facility]   Imported By: Maryln Gottron 01/03/2010 10:09:05  _____________________________________________________________________  External Attachment:    Type:   Image     Comment:   External Document

## 2010-11-01 NOTE — Assessment & Plan Note (Signed)
Summary: 3 month rov/njr   Vital Signs:  Patient profile:   70 year old female Height:      62.5 inches Weight:      146 pounds BMI:     26.37 Temp:     98.2 degrees F oral Pulse rate:   72 / minute Resp:     14 per minute BP sitting:   140 / 80  (left arm)  Vitals Entered By: Willy Eddy, LPN (December 11, 2009 1:33 PM) CC: roa, Hypertension Management   Primary Care Provider:  Peri Jefferson  CC:  roa and Hypertension Management.  History of Present Illness: Follow up foe back pain  has eye surgery for esotropia ( botox first) the pt also has knee resurfacing planned  Hypertension History:      She denies headache, chest pain, palpitations, dyspnea with exertion, orthopnea, PND, peripheral edema, visual symptoms, neurologic problems, syncope, and side effects from treatment.        Positive major cardiovascular risk factors include female age 19 years old or older, hyperlipidemia, and hypertension.  Negative major cardiovascular risk factors include non-tobacco-user status.     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) 2)  Tiredness  (ICD-780.79) 3)  Hypercholesterolemia, Mild  (ICD-272.0) 4)  Constipation  (ICD-564.00) 5)  Diverticulitis of Colon  (ICD-562.11) 6)  Colonic Polyps  (ICD-211.3) 7)  Diverticulosis, Colon  (ICD-562.10) 8)  Preventive Health Care  (ICD-V70.0) 9)  Hip Pain, Right, Chronic  (ICD-719.45) 10)  Constipation, Drug Induced  (ICD-564.09) 11)  Spinal Stenosis, Lumbar  (ICD-724.02) 12)  Acute Bronchitis  (ICD-466.0) 13)  Insomnia, Chronic  (ICD-307.42) 14)  Family History Breast Cancer 1st Degree Relative <50  (ICD-V16.3) 15)  Family History Diabetes 1st Degree Relative  (ICD-V18.0) 16)  Allergic Rhinitis  (ICD-477.9) 17)  Spondylolisthesis  (ICD-756.12) 18)  Low Back Pain  (ICD-724.2) 19)  Osteoporosis  (ICD-733.00) 20)   Hypertension  (ICD-401.9) 21)  Headache  (ICD-784.0)  Current Problems (verified): 1)  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) 2)  Tiredness  (ICD-780.79) 3)  Hypercholesterolemia, Mild  (ICD-272.0) 4)  Constipation  (ICD-564.00) 5)  Diverticulitis of Colon  (ICD-562.11) 6)  Colonic Polyps  (ICD-211.3) 7)  Diverticulosis, Colon  (ICD-562.10) 8)  Preventive Health Care  (ICD-V70.0) 9)  Hip Pain, Right, Chronic  (ICD-719.45) 10)  Constipation, Drug Induced  (ICD-564.09) 11)  Spinal Stenosis, Lumbar  (ICD-724.02) 12)  Acute Bronchitis  (ICD-466.0) 13)  Insomnia, Chronic  (ICD-307.42) 14)  Family History Breast Cancer 1st Degree Relative <50  (ICD-V16.3) 15)  Family History Diabetes 1st Degree Relative  (ICD-V18.0) 16)  Allergic Rhinitis  (ICD-477.9) 17)  Spondylolisthesis  (ICD-756.12) 18)  Low Back Pain  (ICD-724.2) 19)  Osteoporosis  (ICD-733.00) 20)  Hypertension  (ICD-401.9) 21)  Headache  (ICD-784.0)  Medications Prior to Update: 1)  Cymbalta 60 Mg Cpep (Duloxetine Hcl) .... Take 1 Capsule By Mouth Once A Day 2)  Flonase 50 Mcg/act Susp (Fluticasone Propionate) .... Spray 1 Spray Into Both Nostrils Once A Day 3)  Sm Calcium/vitamin D 500-200 Mg-Unit Tabs (Calcium Carbonate-Vitamin D) .... Take 1 Tablet By Mouth Three Times A Day 4)  Vivelle 0.05 Mg/24hr Pttw (Estradiol) .... Apply 1 To Skin Twice A Week 5)  Zanaflex 2 Mg Caps (Tizanidine Hcl) .Marland Kitchen.. 1 At Bedtime As Needed 6)  Lyrica 75 Mg  Caps (Pregabalin) .... By Mouth Bid 7)  Optivar  0.05 %  Soln (Azelastine Hcl) .Marland Kitchen.. 1 Drop in Both Eyes Two Times A Day 8)  Miralax  Powd (Polyethylene Glycol 3350) .Marland KitchenMarland KitchenMarland Kitchen 17 Gr Daily As Needed 9)  Dulcolax 5 Mg Tbec (Bisacodyl) .Marland Kitchen.. 1-4 Tablet By Mouth Two Times A Day 10)  Align  Caps (Misc Intestinal Flora Regulat) .Marland Kitchen.. 1 By Mouth Once Daily 11)  Senokot 8.6 Mg Tabs (Sennosides) .... 2 By Mouth As Needed Every Week or Two 12)  Multivitamins   Tabs (Multiple Vitamin) .... 2 By Mouth Two Times A Day 13)   Viactiv 500-500-40 Mg-Unt-Mcg Chew (Calcium-Vitamin D-Vitamin K) .... 1000mg  Once Daily 14)  Omega 3 1000mg  .... 2 By Mouth Once Daily 15)  Vitamin D 1000 Unit Tabs (Cholecalciferol) .Marland Kitchen.. 1 By Mouth Once Daily 16)  Preservision/lutein  Caps (Multiple Vitamins-Minerals) .Marland Kitchen.. 1 By Mouth Once Daily 17)  Zolpidem Tartrate 5 Mg Tabs (Zolpidem Tartrate) .... One By Mouth Q Hs As Needed 18)  Excedrin Extra Strength 250-250-65 Mg Tabs (Aspirin-Acetaminophen-Caffeine) .... 2 As Needed  Current Medications (verified): 1)  Cymbalta 60 Mg Cpep (Duloxetine Hcl) .... Take 1 Capsule By Mouth Once A Day 2)  Flonase 50 Mcg/act Susp (Fluticasone Propionate) .... Spray 1 Spray Into Both Nostrils Once A Day 3)  Sm Calcium/vitamin D 500-200 Mg-Unit Tabs (Calcium Carbonate-Vitamin D) .... Take 1 Tablet By Mouth Three Times A Day 4)  Vivelle 0.05 Mg/24hr Pttw (Estradiol) .... Apply 1 To Skin Twice A Week 5)  Zanaflex 2 Mg Caps (Tizanidine Hcl) .Marland Kitchen.. 1 At Bedtime As Needed 6)  Lyrica 75 Mg  Caps (Pregabalin) .... By Mouth Bid 7)  Optivar 0.05 %  Soln (Azelastine Hcl) .Marland Kitchen.. 1 Drop in Both Eyes Two Times A Day 8)  Miralax  Powd (Polyethylene Glycol 3350) .Marland KitchenMarland KitchenMarland Kitchen 17 Gr Daily As Needed 9)  Dulcolax 5 Mg Tbec (Bisacodyl) .Marland Kitchen.. 1-4 Tablet By Mouth Two Times A Day 10)  Align  Caps (Misc Intestinal Flora Regulat) .Marland Kitchen.. 1 By Mouth Once Daily 11)  Senokot 8.6 Mg Tabs (Sennosides) .... 2 By Mouth As Needed Every Week or Two 12)  Multivitamins   Tabs (Multiple Vitamin) .... 2 By Mouth Two Times A Day 13)  Viactiv 500-500-40 Mg-Unt-Mcg Chew (Calcium-Vitamin D-Vitamin K) .... 1000mg  Once Daily 14)  Omega 3 1000mg  .... 2 By Mouth Once Daily 15)  Vitamin D 1000 Unit Tabs (Cholecalciferol) .Marland Kitchen.. 1 By Mouth Once Daily 16)  Preservision/lutein  Caps (Multiple Vitamins-Minerals) .Marland Kitchen.. 1 By Mouth Once Daily 17)  Zolpidem Tartrate 5 Mg Tabs (Zolpidem Tartrate) .... One By Mouth Q Hs As Needed 18)  Excedrin Extra Strength 250-250-65 Mg Tabs  (Aspirin-Acetaminophen-Caffeine) .... 2 As Needed  Allergies (verified): No Known Drug Allergies  Directives: 1)  Power of Health Care Att Husband   Past History:  Family History: Last updated: 01/18/2009 alzheimers Family History Diabetes 1st degree relative Family History Breast cancer 1st degree relative <50 Family History of Heart Disease: father No FH of Colon Cancer:  Social History: Last updated: 01/18/2009 Occupation: Radio producer Married Never Smoked Alcohol Use - yes wine 1 glass Daily Caffeine Use 1-2 sodas a day some coffee Illicit Drug Use - no  Risk Factors: Smoking Status: never (12/11/2009)  Past medical, surgical, family and social histories (including risk factors) reviewed, and no changes noted (except as noted below).  Past Medical History: Reviewed history from 01/18/2009 and no changes required. Headache Hypertension Osteoporosis constipation Low back pain lumbar spondylolisthesis with likely facet plus discogenic pain hip resurfacing at [facility]  2009 Arthritis Chronic Headaches Depression Diverticulosis  Past Surgical History: Reviewed history from 01/18/2009 and no changes required. 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 alzheimers Family History Diabetes 1st degree relative Family History Breast cancer 1st degree relative <50 Family History of Heart Disease: father No FH of Colon Cancer:  Social History: Reviewed history from 01/18/2009 and no changes required. Occupation: Radio producer Married Never Smoked Alcohol Use - yes wine 1 glass Daily Caffeine Use 1-2 sodas a day some coffee Illicit Drug Use - no  Review of Systems  The patient denies anorexia, fever, weight loss, weight  gain, vision loss, decreased hearing, hoarseness, chest pain, syncope, dyspnea on exertion, peripheral edema, prolonged cough, headaches, hemoptysis, abdominal pain, melena, hematochezia, severe indigestion/heartburn, hematuria, incontinence, genital sores, muscle weakness, suspicious skin lesions, transient blindness, difficulty walking, depression, unusual weight change, abnormal bleeding, enlarged lymph nodes, angioedema, and breast masses.    Physical Exam  General:  Well developed, well nourished, no acute distress. Head:  Normocephalic and atraumatic. Eyes:  no icterus. Ears:  R ear normal and L ear normal.   Nose:  no external deformity and no nasal discharge.   Mouth:  No deformity or lesions, dentition normal. Neck:  Supple; no masses or thyromegaly. Lungs:  normal respiratory effort and no wheezes.   Heart:  normal rate and regular rhythm.   Abdomen:  soft and non-tender.   Msk:  normal ROM.   Neurologic:  alert & oriented X3 and cranial nerves II-XII intact.     Impression & Recommendations:  Problem # 1:  HIP PAIN, RIGHT, CHRONIC (ICD-719.45) s/p THR Her updated medication list for this problem includes:    Zanaflex 2 Mg Caps (Tizanidine hcl) .Marland Kitchen... 1 at bedtime as needed    Excedrin Extra Strength 250-250-65 Mg Tabs (Aspirin-acetaminophen-caffeine) .Marland Kitchen... 2 as needed  Discussed use of medications, application of heat or cold, and exercises.   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 Take 1 capsule by mouth once a day 2)  Flonase 50 Mcg/act Susp (Fluticasone propionate) .... Spray 1 spray into both nostrils once a day 3)  Sm Calcium/vitamin D 500-200 Mg-unit Tabs (Calcium carbonate-vitamin d) .... Take 1 tablet by mouth three times a day 4)  Vivelle 0.05 Mg/24hr Pttw (Estradiol) .... Apply 1 to skin twice a week 5)  Zanaflex 2 Mg Caps (Tizanidine hcl) .Marland Kitchen.. 1 at bedtime as needed 6)  Lyrica 75 Mg Caps (Pregabalin) .... By mouth bid 7)  Optivar 0.05 % Soln (Azelastine hcl) .Marland Kitchen.. 1 drop in both eyes two times a day 8)  Miralax Powd (Polyethylene glycol 3350) .Marland KitchenMarland KitchenMarland Kitchen 17 gr daily as needed 9)  Dulcolax 5 Mg Tbec (Bisacodyl) .Marland Kitchen.. 1-4 tablet by  mouth two times a day 10)  Align Caps (Misc intestinal flora regulat) .Marland Kitchen.. 1 by mouth once daily 11)  Senokot 8.6 Mg Tabs (Sennosides) .... 2 by mouth as needed every week or two 12)  Multivitamins Tabs (Multiple vitamin) .... 2 by mouth two times a day 13)  Viactiv 500-500-40 Mg-unt-mcg Chew (Calcium-vitamin d-vitamin k) .... 1000mg  once daily 14)  Omega 3 1000mg   .... 2 by mouth once daily 15)  Vitamin D 1000 Unit Tabs (Cholecalciferol) .Marland Kitchen.. 1 by mouth once daily 16)  Preservision/lutein Caps (Multiple vitamins-minerals) .Marland Kitchen.. 1 by mouth once daily 17)  Zolpidem Tartrate 5 Mg Tabs (Zolpidem tartrate) .... One by mouth q hs as needed 18)  Excedrin Extra Strength 250-250-65 Mg Tabs (Aspirin-acetaminophen-caffeine) .... 2 as needed  Other Orders: Prescription Created Electronically [id number])  Hypertension Assessment/Plan:      The patient's hypertensive risk group is category B: At least one risk factor (excluding diabetes) with no target organ damage.  Her calculated 10 year risk of coronary heart disease is 7 %.  Today's blood pressure is 140/80.  Her blood pressure goal is <  140/90.  Patient Instructions: 1)  Please schedule a follow-up appointment in 3 months. Prescriptions: ZOLPIDEM TARTRATE 5 MG TABS (ZOLPIDEM TARTRATE) one by mouth q HS as needed  #30 x 3   Entered by:   Willy Eddy, LPN   Authorized by:   Stacie Glaze MD   Signed by:   Willy Eddy, LPN on 60/45/4098   Method used:   Print then Give to Patient   RxID:   [id number]

## 2010-11-01 NOTE — Progress Notes (Signed)
Summary: lab results  Phone Note Call from Patient Call back at Home Phone [phone]   Caller: Patient Call For: Stacie Glaze MD Summary of Call: Pt would like lab results Initial call taken by: Heron Sabins,  August 29, 2010 9:16 AM  Follow-up for Phone Call        pt informed and copy out front Follow-up by: Willy Eddy, LPN,  August 29, 2010 9:38 AM

## 2010-11-01 NOTE — Assessment & Plan Note (Signed)
Summary: 3 MONTH FUP//CCM   Vital Signs:  Patient profile:   70 year old female Height:      62.5 inches Weight:      142 pounds BMI:     25.65 Temp:     98.2 degrees F oral Pulse rate:   72 / minute Resp:     14 per minute BP sitting:   120 / 70  (left arm)  Vitals Entered By: Willy Eddy, LPN (March 12, 2010 2:06 PM) CC: roa, Hypertension Management   Primary Care Provider:  Peri Jefferson  CC:  roa and Hypertension Management.  History of Present Illness: weight is down  and back pain is well pt is generally  reveiwed pain control wiht the lyrica and refill as needed  Hypertension History:      She denies headache, chest pain, palpitations, dyspnea with exertion, orthopnea, PND, peripheral edema, visual symptoms, neurologic problems, syncope, and side effects from treatment.        Positive major cardiovascular risk factors include female age 31 years old or older, hyperlipidemia, and hypertension.  Negative major cardiovascular risk factors include non-tobacco-user status.     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) 4)  Tiredness  (ICD-780.79) 5)  Hypercholesterolemia, Mild  (ICD-272.0) 6)  Constipation  (ICD-564.00) 7)  Diverticulitis of Colon  (ICD-562.11) 8)  Colonic Polyps  (ICD-211.3) 9)  Diverticulosis, Colon  (ICD-562.10) 10)  Preventive Health Care  (ICD-V70.0) 11)  Hip Pain, Right, Chronic  (ICD-719.45) 12)  Constipation, Drug Induced  (ICD-564.09) 13)  Spinal Stenosis, Lumbar  (ICD-724.02) 14)  Acute Bronchitis  (ICD-466.0) 15)  Insomnia, Chronic  (ICD-307.42) 16)  Family History Breast Cancer 1st Degree Relative <50  (ICD-V16.3) 17)  Family History Diabetes 1st Degree Relative  (ICD-V18.0) 18)  Allergic Rhinitis  (ICD-477.9) 19)   Spondylolisthesis  (ICD-756.12) 20)  Low Back Pain  (ICD-724.2) 21)  Osteoporosis  (ICD-733.00) 22)  Hypertension  (ICD-401.9) 23)  Headache  (ICD-784.0)  Medications Prior to Update: 1)  Cymbalta 60 Mg Cpep (Duloxetine Hcl) .... Take 1 Capsule By Mouth Once A Day 2)  Flonase 50 Mcg/act Susp (Fluticasone Propionate) .... Spray 1 Spray Into Both Nostrils Once A Day 3)  Sm Calcium/vitamin D 500-200 Mg-Unit Tabs (Calcium Carbonate-Vitamin D) .... Take 1 Tablet By Mouth Three Times A Day 4)  Vivelle 0.05 Mg/24hr Pttw (Estradiol) .... Apply 1 To Skin Twice A Week 5)  Zanaflex 2 Mg Caps (Tizanidine Hcl) .Marland Kitchen.. 1 At Bedtime As Needed 6)  Lyrica 75 Mg  Caps (Pregabalin) .... By Mouth Bid 7)  Optivar 0.05 %  Soln (Azelastine Hcl) .Marland Kitchen.. 1 Drop in Both Eyes Two Times A Day 8)  Miralax  Powd (Polyethylene Glycol 3350) .Marland KitchenMarland KitchenMarland Kitchen 17 Gr Daily As Needed 9)  Dulcolax 5 Mg Tbec (Bisacodyl) .Marland Kitchen.. 1-4 Tablet By Mouth Two Times A Day 10)  Align  Caps (Misc Intestinal Flora Regulat) .Marland Kitchen.. 1 By Mouth Once Daily 11)  Senokot 8.6 Mg Tabs (Sennosides) .... 2 By Mouth As Needed Every Week or Two 12)  Multivitamins   Tabs (Multiple Vitamin) .... 2 By Mouth Two Times A Day 13)  Viactiv 500-500-40 Mg-Unt-Mcg Chew (Calcium-Vitamin D-Vitamin K) .... 1000mg  Once Daily 14)  Omega 3 1000mg  .... 2 By Mouth Once Daily 15)  Vitamin  D 1000 Unit Tabs (Cholecalciferol) .Marland Kitchen.. 1 By Mouth Once Daily 16)  Preservision/lutein  Caps (Multiple Vitamins-Minerals) .Marland Kitchen.. 1 By Mouth Once Daily 17)  Zolpidem Tartrate 5 Mg Tabs (Zolpidem Tartrate) .... One By Mouth Q Hs As Needed 18)  Excedrin Extra Strength 250-250-65 Mg Tabs (Aspirin-Acetaminophen-Caffeine) .... 2 As Needed  Current Medications (verified): 1)  Cymbalta 60 Mg Cpep (Duloxetine Hcl) .... Take 1 Capsule By Mouth Once A Day 2)  Flonase 50 Mcg/act Susp (Fluticasone Propionate) .... Spray 1 Spray Into Both Nostrils Once A Day 3)  Sm Calcium/vitamin D 500-200 Mg-Unit Tabs (Calcium  Carbonate-Vitamin D) .... Take 1 Tablet By Mouth Three Times A Day 4)  Vivelle 0.05 Mg/24hr Pttw (Estradiol) .... Apply 1 To Skin Twice A Week 5)  Zanaflex 2 Mg Caps (Tizanidine Hcl) .Marland Kitchen.. 1 At Bedtime As Needed 6)  Lyrica 75 Mg  Caps (Pregabalin) .... By Mouth Bid 7)  Optivar 0.05 %  Soln (Azelastine Hcl) .Marland Kitchen.. 1 Drop in Both Eyes Two Times A Day 8)  Miralax  Powd (Polyethylene Glycol 3350) .Marland KitchenMarland KitchenMarland Kitchen 17 Gr Daily As Needed 9)  Dulcolax 5 Mg Tbec (Bisacodyl) .Marland Kitchen.. 1-4 Tablet By Mouth Two Times A Day 10)  Align  Caps (Misc Intestinal Flora Regulat) .Marland Kitchen.. 1 By Mouth Once Daily 11)  Senokot 8.6 Mg Tabs (Sennosides) .... 2 By Mouth As Needed Every Week or Two 12)  Multivitamins   Tabs (Multiple Vitamin) .... 2 By Mouth Two Times A Day 13)  Viactiv 500-500-40 Mg-Unt-Mcg Chew (Calcium-Vitamin D-Vitamin K) .... 1000mg  Once Daily 14)  Omega 3 1000mg  .... 2 By Mouth Once Daily 15)  Vitamin D 1000 Unit Tabs (Cholecalciferol) .Marland Kitchen.. 1 By Mouth Once Daily 16)  Preservision/lutein  Caps (Multiple Vitamins-Minerals) .Marland Kitchen.. 1 By Mouth Once Daily 17)  Zolpidem Tartrate 5 Mg Tabs (Zolpidem Tartrate) .... One By Mouth Q Hs As Needed 18)  Excedrin Extra Strength 250-250-65 Mg Tabs (Aspirin-Acetaminophen-Caffeine) .... 2 As Needed 19)  Mobic 15 Mg Tabs (Meloxicam) .Marland Kitchen.. 1 Once Daily  Allergies (verified): No Known Drug Allergies  Directives: 1)  Power of Health Care Att Husband   Past History:  Family History: Last updated: 01/18/2009 alzheimers Family History Diabetes 1st degree relative Family History Breast cancer 1st degree relative <50 Family History of Heart Disease: father No FH of Colon Cancer:  Social History: Last updated: 01/18/2009 Occupation: Radio producer Married Never Smoked Alcohol Use - yes wine 1 glass Daily Caffeine Use 1-2 sodas a day some coffee Illicit Drug Use - no  Risk Factors: Smoking Status: never (03/12/2010)  Past medical, surgical, family and social histories (including  risk factors) reviewed, and no changes noted (except as noted below).  Past Medical History: Reviewed history from 01/18/2009 and no changes required. Headache Hypertension Osteoporosis constipation Low back pain lumbar spondylolisthesis with likely facet plus discogenic pain hip resurfacing at [facility] 2009 Arthritis Chronic Headaches Depression Diverticulosis  Past Surgical History: Reviewed history from 01/18/2009 and no changes required. 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 alzheimers Family History Diabetes 1st degree relative Family History Breast cancer 1st degree relative <50 Family History of Heart Disease: father No FH of Colon Cancer:  Social History: Reviewed history from 01/18/2009 and no changes required. Occupation: Radio producer Married Never Smoked Alcohol Use - yes wine 1 glass Daily Caffeine Use 1-2 sodas a day  some coffee Illicit Drug Use - no  Review of Systems  The patient denies anorexia, fever, weight loss, weight gain, vision loss, decreased hearing, hoarseness, chest pain, syncope, dyspnea on exertion, peripheral edema, prolonged cough, headaches, hemoptysis, abdominal pain, melena, hematochezia, severe indigestion/heartburn, hematuria, incontinence, genital sores, muscle weakness, suspicious skin lesions, transient blindness, difficulty walking, depression, unusual weight change, abnormal bleeding, enlarged lymph nodes, angioedema, and breast masses.    Physical Exam  General:  Well developed, well nourished, no acute distress. Head:  Normocephalic and atraumatic. Eyes:  no icterus. Ears:  R ear normal and L ear normal.   Nose:  no external deformity and no nasal discharge.   Mouth:  No deformity or  lesions, dentition normal. Neck:  Supple; no masses or thyromegaly. Lungs:  normal respiratory effort and no wheezes.   Heart:  normal rate and regular rhythm.   Abdomen:  soft and non-tender.   Msk:  normal ROM.   Extremities:  No clubbing, cyanosis, edema, or deformity noted with normal full range of motion of all joints.   Neurologic:  alert & oriented X3 and cranial nerves II-XII intact.     Impression & Recommendations:  Problem # 1:  HYPERTENSION (ICD-401.9)  BP today: 120/70 Prior BP: 140/80 (12/11/2009)  10 Yr Risk Heart Disease: 5 % Prior 10 Yr Risk Heart Disease: 7 % (09/11/2009)  Labs Reviewed: K+: 4.3 (03/20/2009) Creat: : 0.7 (03/20/2009)   Chol: 167 (01/23/2009)   HDL: 66.30 (01/23/2009)   LDL: 85 (01/23/2009)   TG: 78.0 (01/23/2009)  Problem # 2:  OSTEOPOROSIS (ICD-733.00)  Discussed medication use, applications of heat or ice, and exercises.   Problem # 3:  HEADACHE (ICD-784.0)  Her updated medication list for this problem includes:    Excedrin Extra Strength 250-250-65 Mg Tabs (Aspirin-acetaminophen-caffeine) .Marland Kitchen... 2 as needed    Mobic 15 Mg Tabs (Meloxicam) .Marland Kitchen... 1 once daily  Headache diary reviewed.  Problem # 4:  LOW BACK PAIN (ICD-724.2) Assessment: Unchanged  Her updated medication list for this problem includes:    Zanaflex 2 Mg Caps (Tizanidine hcl) .Marland Kitchen... 1 at bedtime as needed    Excedrin Extra Strength 250-250-65 Mg Tabs (Aspirin-acetaminophen-caffeine) .Marland Kitchen... 2 as needed    Mobic 15 Mg Tabs (Meloxicam) .Marland Kitchen... 1 once daily  Discussed use of moist heat or ice, modified activities, medications, and stretching/strengthening exercises. Back care instructions given. To be seen in 2 weeks if no improvement; sooner if worsening of symptoms.   Problem # 5:  HIP PAIN, RIGHT, CHRONIC (ICD-719.45)  Her updated medication list for this problem includes:    Zanaflex 2 Mg Caps (Tizanidine hcl) .Marland Kitchen... 1 at bedtime as needed    Excedrin Extra Strength  250-250-65 Mg Tabs (Aspirin-acetaminophen-caffeine) .Marland Kitchen... 2 as needed    Mobic 15 Mg Tabs (Meloxicam) .Marland Kitchen... 1 once daily  Discussed use of medications, application of heat or cold, and exercises.   Complete Medication List: 1)  Cymbalta 60 Mg Cpep (Duloxetine hcl) .... Take 1 capsule by mouth once a day 2)  Flonase 50 Mcg/act Susp (Fluticasone propionate) .... Spray 1 spray into both nostrils once a day 3)  Sm Calcium/vitamin D 500-200 Mg-unit Tabs (Calcium carbonate-vitamin d) .... Take 1 tablet by mouth three times a day 4)  Vivelle 0.05 Mg/24hr Pttw (Estradiol) .... Apply 1 to skin twice a week 5)  Zanaflex 2 Mg Caps (Tizanidine hcl) .Marland Kitchen.. 1 at bedtime as needed 6)  Lyrica 75 Mg Caps (Pregabalin) .... By mouth bid 7)  Optivar  0.05 % Soln (Azelastine hcl) .Marland Kitchen.. 1 drop in both eyes two times a day 8)  Miralax Powd (Polyethylene glycol 3350) .Marland KitchenMarland KitchenMarland Kitchen 17 gr daily as needed 9)  Dulcolax 5 Mg Tbec (Bisacodyl) .Marland Kitchen.. 1-4 tablet by mouth two times a day 10)  Align Caps (Misc intestinal flora regulat) .Marland Kitchen.. 1 by mouth once daily 11)  Senokot 8.6 Mg Tabs (Sennosides) .... 2 by mouth as needed every week or two 12)  Multivitamins Tabs (Multiple vitamin) .... 2 by mouth two times a day 13)  Viactiv 500-500-40 Mg-unt-mcg Chew (Calcium-vitamin d-vitamin k) .... 1000mg  once daily 14)  Omega 3 1000mg   .... 2 by mouth once daily 15)  Vitamin D 1000 Unit Tabs (Cholecalciferol) .Marland Kitchen.. 1 by mouth once daily 16)  Preservision/lutein Caps (Multiple vitamins-minerals) .Marland Kitchen.. 1 by mouth once daily 17)  Zolpidem Tartrate 5 Mg Tabs (Zolpidem tartrate) .... One by mouth q hs as needed 18)  Excedrin Extra Strength 250-250-65 Mg Tabs (Aspirin-acetaminophen-caffeine) .... 2 as needed 19)  Mobic 15 Mg Tabs (Meloxicam) .Marland Kitchen.. 1 once daily  Hypertension Assessment/Plan:      The patient's hypertensive risk group is category B: At least one risk factor (excluding diabetes) with no target organ damage.  Her calculated 10 year risk of  coronary heart disease is 5 %.  Today's blood pressure is 120/70.  Her blood pressure goal is < 140/90.  Patient Instructions: 1)  first annual medicare wellness exam 2)  30 min spot 3)  Please schedule a follow-up appointment in 4 months. Prescriptions: LYRICA 75 MG  CAPS (PREGABALIN) by mouth bid  #180 x 3   Entered and Authorized by:   Stacie Glaze MD   Signed by:   Stacie Glaze MD on 03/12/2010   Method used:   Print then Give to Patient   RxID:   [id number]

## 2010-11-28 ENCOUNTER — Encounter: Payer: Self-pay | Admitting: Internal Medicine

## 2010-11-29 ENCOUNTER — Encounter: Payer: Self-pay | Admitting: Internal Medicine

## 2010-11-29 ENCOUNTER — Ambulatory Visit (INDEPENDENT_AMBULATORY_CARE_PROVIDER_SITE_OTHER): Payer: Medicare Other | Admitting: Internal Medicine

## 2010-11-29 VITALS — BP 140/80 | HR 60 | Temp 98.5°F | Resp 14 | Ht 62.0 in | Wt 142.0 lb

## 2010-11-29 DIAGNOSIS — E7800 Pure hypercholesterolemia, unspecified: Secondary | ICD-10-CM

## 2010-11-29 DIAGNOSIS — H5000 Unspecified esotropia: Secondary | ICD-10-CM

## 2010-11-29 DIAGNOSIS — M545 Low back pain, unspecified: Secondary | ICD-10-CM

## 2010-11-29 DIAGNOSIS — K5909 Other constipation: Secondary | ICD-10-CM

## 2010-11-29 DIAGNOSIS — R51 Headache: Secondary | ICD-10-CM

## 2010-11-29 NOTE — Assessment & Plan Note (Signed)
No current problem .... Will keep on list and a chronic issue

## 2010-11-29 NOTE — Assessment & Plan Note (Signed)
He is at her current goals for her cholesterol given her risk factor stratification.

## 2010-11-29 NOTE — Assessment & Plan Note (Signed)
Had surgery on August 1 for the esotropia.... There is some resumption of the symptoms and futher surgery is considered

## 2010-11-29 NOTE — Assessment & Plan Note (Signed)
On the lyrica as well with stable control of pain

## 2010-11-29 NOTE — Assessment & Plan Note (Signed)
He is regulating her drug-induced constipation with MiraLax.

## 2010-11-29 NOTE — Progress Notes (Signed)
Subjective:    Patient ID: Tanya Leon, female    DOB: Mar 08, 1941, 70 y.o.   MRN: [id number]  HPI   patient is a 70 year old white female who presents for follow up of her hyperlipidemia her hypertension chronic constipation there has been medication induced in the past as well as a history of chronic low back pain.  Her low back pain currently is being controlled by a combination of Lyrica and Cymbalta. She is off all narcotic medications at this time she has a history of hyperlipidemia  her hyperlipidemia has been treated by combination of omega 3  Supplements and diet.   She denies any current chest pain shortness of breath she has fair control of her back pain she currently has no headaches. Review of Systems  Constitutional: Negative for activity change, appetite change and fatigue.  HENT: Negative for ear pain, congestion, neck pain, postnasal drip and sinus pressure.   Eyes: Positive for visual disturbance. Negative for redness.  Respiratory: Negative for cough, shortness of breath and wheezing.   Gastrointestinal: Negative for abdominal pain and abdominal distention.  Genitourinary: Negative for dysuria, frequency and menstrual problem.  Musculoskeletal: Negative for myalgias, joint swelling and arthralgias.  Skin: Negative for rash and wound.  Neurological: Negative for dizziness, weakness and headaches.  Hematological: Negative for adenopathy. Does not bruise/bleed easily.  Psychiatric/Behavioral: Negative for sleep disturbance and decreased concentration.   Past Medical History  Diagnosis Date  . Headache   . Hypertension   . Osteoporosis   . Constipation   . Low back pain   . Lumbar spondylolysis   . Arthritis   . Headache   . Depression   . Diverticulosis    Past Surgical History  Procedure Date  . Tonsillectomly   . Abdominal hysterectomy   . Perforated deverticular abscess/laporotomy and colostomy   . Take down of colostomy   . Back fu sion   . Joint  replacement     reports that she has never smoked. She does not have any smokeless tobacco history on file. She reports that she drinks alcohol. She reports that she does not use illicit drugs. family history includes Alzheimer's disease in an unspecified family member; Cancer in her mother; and Heart disease in her father.        Objective:   Physical Exam  Constitutional: She is oriented to person, place, and time. She appears well-developed and well-nourished. No distress.  HENT:  Head: Normocephalic and atraumatic.  Right Ear: External ear normal.  Left Ear: External ear normal.  Nose: Nose normal.  Mouth/Throat: Oropharynx is clear and moist.  Eyes: Right eye exhibits no discharge. Left eye exhibits no discharge. No scleral icterus.        She has marked esotropia of the right eye  Neck: Normal range of motion. Neck supple. No JVD present. No tracheal deviation present. No thyromegaly present.  Cardiovascular: Normal rate, regular rhythm, normal heart sounds and intact distal pulses.   No murmur heard. Pulmonary/Chest: Effort normal and breath sounds normal. She has no wheezes. She exhibits no tenderness.  Abdominal: Soft. Bowel sounds are normal.  Musculoskeletal: Normal range of motion. She exhibits no edema and no tenderness.  Lymphadenopathy:    She has no cervical adenopathy.  Neurological: She is alert and oriented to person, place, and time. She has normal reflexes. No cranial nerve deficit.  Skin: Skin is warm and dry. She is not diaphoretic.  Psychiatric: She has a normal mood and affect. Her behavior  is normal.          Assessment & Plan:

## 2010-12-14 ENCOUNTER — Ambulatory Visit (INDEPENDENT_AMBULATORY_CARE_PROVIDER_SITE_OTHER): Payer: Medicare Other | Admitting: Internal Medicine

## 2010-12-14 ENCOUNTER — Encounter: Payer: Self-pay | Admitting: Internal Medicine

## 2010-12-14 VITALS — BP 140/84 | HR 68 | Temp 98.2°F | Resp 14 | Ht 62.5 in | Wt 139.0 lb

## 2010-12-14 DIAGNOSIS — I10 Essential (primary) hypertension: Secondary | ICD-10-CM

## 2010-12-14 NOTE — Assessment & Plan Note (Addendum)
Not on current medications No swelling of lower extremities  patient is reluctant to add additional medication to her regimen and her blood pressures at home have been running in the 1:30 range systolic therefore we will start with a hypertensive diet by doing sodium limitation We will give her a copy of the hypertensive diet follow she can identify things that might result in a lower blood pressure if this is successful she can avoid medications

## 2010-12-14 NOTE — Patient Instructions (Signed)
Sodium-Controlled Diet Sodium is a mineral. It is found in many foods. Sodium may be found naturally or added during the making of a food. The most common form of sodium is salt, which is made up of sodium and chloride. Reducing your sodium intake involves changing your eating habits. The following guidelines will help you reduce the sodium in your diet:  Stop using the salt shaker.   Use salt sparingly in cooking and baking.   Substitute with sodium-free seasonings and spices.   Do not use a salt substitute (potassium chloride) without your caregiver's permission.   Include a variety of fresh, unprocessed foods in your diet.   Limit the use of processed and convenience foods that are high in sodium.  USE THE FOLLOWING FOODS SPARINGLY: Breads/Starches  Commercial bread stuffing, commercial pancake or waffle mixes, coating mixes. Waffles. Croutons. Prepared (boxed or frozen) potato, rice, or noodle mixes that contain salt or sodium. Salted French fries or hash browns. Salted popcorn, breads, crackers, chips, or snack foods.  Vegetables  Vegetables canned with salt or prepared in cream, butter, or cheese sauces. Sauerkraut. Tomato or vegetable juices canned with salt.   Fresh vegetables are allowed if rinsed thoroughly.  Fruit  Fruit is okay to eat.  Meat and Meat Substitutes  Salted or smoked meats, such as bacon or Canadian bacon, chipped or corned beef, hot dogs, salt pork, luncheon meats, pastrami, ham, or sausage. Canned or smoked fish, poultry, or meat. Processed cheese or cheese spreads, blue or Roquefort cheese. Battered or frozen fish products. Prepared spaghetti sauce. Baked beans. Reuben sandwiches. Salted nuts. Caviar.  Milk  Limit buttermilk to 1 cup per week.  Soups and Combination Foods  Bouillon cubes, canned or dried soups, broth, consomm. Convenience (frozen or packaged) dinners with more than 600 milligrams (mg) sodium. Pot pies, pizza, Asian food, fast food  cheeseburgers, and specialty sandwiches.  Desserts and Sweets  Regular (salted) desserts, pie, commercial fruit snack pies, commercial snack cakes, canned puddings.   Eat desserts and sweets in moderation.  Fats and Oils  Gravy mixes or canned gravy. No more than 1 to 2 tablespoons of salad dressing. Chip dips.   Eat fats and oils in moderation.  Beverages  See those listed under the vegetables and milk groups.  Condiments/Miscellaneous  Ketchup, mustard, meat sauces, salsa, regular (salted) and lite soy sauce or mustard. Dill pickles, olives, meat tenderizer. Prepared horseradish or pickle relish. Dutch-processed cocoa. Baking powder or baking soda used medicinally. Worcestershire sauce. "Light" salt. Salt substitute, unless approved by your caregiver.  Document Released: 03/08/2002 Document Re-Released: 12/11/2009 [employer] Patient Information 2011 [employer]. 

## 2011-01-06 LAB — CREATININE, SERUM
Creatinine, Ser: 0.75 mg/dL (ref 0.4–1.2)
GFR calc non Af Amer: >60 mL/min (ref >60)

## 2011-01-06 LAB — BUN: BUN: 10 mg/dL (ref 6–23)

## 2011-02-12 NOTE — Procedures (Signed)
NAMEJESSEE, NEWNAM                ACCOUNT NO.:  [id number]   MEDICAL RECORD NO.:  [id number]          PATIENT TYPE:  REC   LOCATION:  [facility]                          FACILITY:  [facility]   PHYSICIAN:  Erick Colace, M.D.DATE OF BIRTH:  05/06/1941   DATE OF PROCEDURE:  DATE OF DISCHARGE:                               OPERATIVE REPORT   Patient here today with back pain duties spondylolisthesis.  She is on  OxyContin 20 mg q.a.m. pain levels are 2/10.  She is working full-time.   ACUPUNCTURE TREATMENT #6:  Needles placed:  Bilateral BL 21, BL 29 as  well as right BL no now and how she low  23, 25, and BL-27.  Right GB  30, right BL 54, right GB 26, right GB 25 and right BL 52, 4 Hz  stimulation x30 minutes.  Return next week for repeat procedure.  Will  reduce OxyContin to 10 mg q.a.m. and monitor pain levels.      Erick Colace, M.D.  Electronically Signed     AEK/MEDQ  D:  03/30/2007 08:41:56  T:  03/30/2007 09:29:37  Job:  [id number]   cc:   Stacie Glaze, MD  [address] [place]  [place] [address]   Caralyn Guile. Ethelene Hal, M.D.  Fax: [phone]

## 2011-02-12 NOTE — Assessment & Plan Note (Signed)
The patient returns today with lumbar spondylolisthesis with right-sided  low back and buttock pain responding well to treatment as of last visit.  However, it has been about five weeks since the last treatment and she  notes some increasing pain.  She has had some response with Durabac but  Ultram ER seems to be working better.  She is back on this 200 mg a day.   PHYSICAL EXAMINATION:  GENERAL:  No acute distress.  Mood and affect  appropriate.  Pain levels are 2-3 out of 10 currently.  BACK:  Mild tenderness to palpation right lower paraspinals.   No new problems.   IMPRESSION:  Lumbar spondylolisthesis right-sided low back and buttock  pain.   PLAN:  1. Continue with Ultram ER.  2. Acupuncture treatment today.      Erick Colace, M.D.  Electronically Signed     AEK/MedQ  D:  06/29/2007 11:20:32  T:  06/29/2007 12:08:04  Job #:  [id number]   cc:   Stacie Glaze, MD  [address] [place]  [place] [address]

## 2011-02-12 NOTE — Procedures (Signed)
NAMEPRINCE, OLIVIER                ACCOUNT NO.:  [id number]   MEDICAL RECORD NO.:  [id number]          PATIENT TYPE:  REC   LOCATION:  [facility]                          FACILITY:  [facility]   PHYSICIAN:  Erick Colace, M.D.DATE OF BIRTH:  [birth year]   DATE OF PROCEDURE:  03/12/2007  DATE OF DISCHARGE:                               OPERATIVE REPORT   A 70 year old female with lumbar spondylolisthesis with facet and  discogenic pain here for her fifth acupuncture treatment.   PROCEDURE:  The needle was placed bilaterally at DL-21 and WU-13 as well  as right KG-40, BL-25, BL-27, right GB-30, right BL-54, right BL-53,  right GB-26, and right BL-50.  Electrical stimulation 4 Hz x30 minutes.  The patient tolerated the procedure well.  Will repeat treatment next  week.  I asked her to stop her nighttime Oxy-Contin.      Erick Colace, M.D.  Electronically Signed     AEK/MEDQ  D:  03/12/2007 14:50:39  T:  03/12/2007 10:27:25  Job:  [id number]   cc:   Sherilyn Cooter A. Pool, M.D.  Fax: [phone]   Richard D. Ethelene Hal, M.D.  Fax: [phone]   Ollen Gross, M.D.  Fax: [phone]

## 2011-02-12 NOTE — Procedures (Signed)
Tanya Leon, Tanya Leon                ACCOUNT NO.:  [id number]   MEDICAL RECORD NO.:  [id number]          PATIENT TYPE:  REC   LOCATION:  TPC                          FACILITY:  [facility]   PHYSICIAN:  Erick Colace, M.D.DATE OF BIRTH:  [birth year]   DATE OF PROCEDURE:  05/11/2007  DATE OF DISCHARGE:                               OPERATIVE REPORT   PROCEDURE:  Acupuncture treatment, number 12.   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   DESCRIPTION OF PROCEDURE:  Needles placed bilaterally at BL21, BL27, as  well as right BL23, BL25, right BL54, right DB30, right DB25, and right  DB26.  4 HZ stimulation x30 minutes.  The patient tolerated the  procedure well.  See her back in two weeks after which we should be able  to spread out treatments every 3 or maybe 4 weeks.      Erick Colace, M.D.  Electronically Signed     AEK/MEDQ  D:  05/11/2007 16:27:12  T:  05/12/2007 16:42:20  Job:  [id number]

## 2011-02-12 NOTE — Group Therapy Note (Signed)
REASON FOR CONSULTATION:  Chronic right-sided low back and hip pain.   HISTORY:  This is a 70 year old female with a 15-year-old history of low  back pain with no apparent trauma.  She has had worsening gradually over  the course of time.  Has had no lower extremity weakness.  She has been  able to work but has required increasing dosages of analgesic  medications.  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  She was evaluated by neurosurgery and felt that she  would be an appropriate surgical candidate given her stenosis and  degenerative spondylolisthesis but patient elected to continue  conservative care.  She is seeing Dr. Lequita Halt for her knee pain as  needed has done well with viscosupplementation.   CURRENT MEDICATIONS:  OxyContin 20 mg q.12 hours.   PAST SURGICAL HISTORY:  1. Tonsillectomy in 1947.  2. She has had hysterectomy.  3. 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  4. Take down of colostomy in July 28, 2006.   SOCIAL HISTORY:  Married.  Works as a IT trainer.  She drinks 2 to 4 glasses of  wine per week.   FAMILY HISTORY:  Heart disease, diabetes, high blood pressure.   EXAMINATION:  Blood pressure 147/68, pulse 62, respirations 18, O2 sat  97% on room air.  GENERAL:  No acute distress.  Mood and affect are appropriate.  Gait is  normal.  Her upper extremity strength is 5/5 in the deltoid, biceps, triceps,  grip, hip flexion, knee extension and ankle dorsiflexion.  Her back has tenderness to palpation of the lumbosacral junction.  She  has pain with both flexion and extension but has good range.  She has  good strength in the lower extremities and normal range of motion.  Sensation is normal to pinprick, proprioception and light touch in  bilateral upper and lower extremities.   IMPRESSION:  Lumbar spondylolisthesis with likely facet plus discogenic  pain.   PLAN:  Discussed trial of  acupuncture which would consist of 6 visits  twice a week time 3 weeks and if successful in reducing pain level we  will be able to spread out treatments.  We may need ongoing treatments  at a less frequent basis, i.e., monthly.   We will do an abbreviated treatment today to assess her comfort with  acupuncture and schedule the trial from there.   ADDENDUM  Needle is placed bilaterally at BL23, BL52 and at GBS and Alecia Lemming 4 eased  in between BL23 and BL52 bilaterally for 2 HZ x20 minutes.  The patient  tolerated procedure well.      Erick Colace, M.D.  Electronically Signed     AEK/MedQ  D:  02/10/2007 18:22:46  T:  02/11/2007 07:34:59  Job #:  [id number]   cc:   Sherilyn Cooter A. Pool, M.D.  Fax: [phone]

## 2011-02-12 NOTE — Procedures (Signed)
Tanya Leon, Tanya Leon                ACCOUNT NO.:  [id number]   MEDICAL RECORD NO.:  [id number]          PATIENT TYPE:  REC   LOCATION:  TPC                          FACILITY:  [facility]   PHYSICIAN:  Erick Colace, M.D.DATE OF BIRTH:  1941/02/13   DATE OF PROCEDURE:  04/21/2007  DATE OF DISCHARGE:                               OPERATIVE REPORT   [name] follows up today after I last saw her 1 week ago.  She has  been weaned off her OxyContin.  This will be her 10th acupuncture  treatment today.  She is maintained on Ultram ER 100 mg daily, was given  samples last week, would like a prescription for this.  No side effects  from the medication.  Has on a couple occasions taken hydrocodone one  tablet per day.  Generally speaking, doing much better in the morning  with stiffness.   PROCEDURE:  Needles placed bilaterally at BL-21, 23, 25 as well a right  BL-27, right BL-54, right GB-30, right GB-25, right GB-26, electrical  Stim 4 hertz x30 minutes.  The patient tolerated procedure well.  I will  see her back in 1 week and then following that visit will spread out  visits every 2 weeks.      Erick Colace, M.D.  Electronically Signed     AEK/MEDQ  D:  04/21/2007 09:24:41  T:  04/21/2007 12:24:11  Job:  [id number]   cc:   Caralyn Guile. Ethelene Hal, M.D.  Fax: [phone]

## 2011-02-12 NOTE — Procedures (Signed)
Tanya Leon, CROCK                ACCOUNT NO.:  [id number]   MEDICAL RECORD NO.:  [id number]          PATIENT TYPE:  REC   LOCATION:  TPC                          FACILITY:  [facility]   PHYSICIAN:  Erick Colace, M.D.DATE OF BIRTH:  [birth year]   DATE OF PROCEDURE:  05/25/2007  DATE OF DISCHARGE:                               OPERATIVE REPORT   This is acupuncture treatment #13.   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   Needles placed bilateral BL-21, BL-27 as well as right BL-23, BL-25,  right BL-54, right GB-30, right GB-25, right GB-26 4 hertz stimulation  30 minutes.  Given that she is doing well will spread out treatments to  about a month; in fact, it will be about 5 weeks next visit based on her  being out of town.      Erick Colace, M.D.  Electronically Signed     AEK/MEDQ  D:  05/25/2007 10:14:52  T:  05/25/2007 15:18:26  Job:  [id number]   cc:   Stacie Glaze, MD  [address] [place]  [place] [address]

## 2011-02-12 NOTE — Procedures (Signed)
Tanya Leon, Tanya Leon                ACCOUNT NO.:  [id number]   MEDICAL RECORD NO.:  [id number]          PATIENT TYPE:  REC   LOCATION:  TPC                          FACILITY:  [facility]   PHYSICIAN:  Erick Colace, M.D.DATE OF BIRTH:  June 15, 1941   DATE OF PROCEDURE:  02/24/2007  DATE OF DISCHARGE:                               OPERATIVE REPORT   PROCEDURE:  Acupuncture treatment.   For lumbar spondylolisthesis.  Pain level today is 4/10.  The patient  was placed prone on the exam table.  Needles placed bilaterally at BL21,  BL23, BL25, BL27, 4 hertz x30 minutes.  The patient tolerated the  procedure well.  Will expand treatment next visit adding GB30  bilaterally.  Post procedure instructions given.      Erick Colace, M.D.  Electronically Signed     AEK/MEDQ  D:  02/24/2007 16:17:55  T:  02/24/2007 19:35:44  Job:  [id number]

## 2011-02-12 NOTE — Procedures (Signed)
NAMEMAJORIE, SANTEE                ACCOUNT NO.:  [id number]   MEDICAL RECORD NO.:  [id number]          PATIENT TYPE:  REC   LOCATION:  TPC                          FACILITY:  [facility]   PHYSICIAN:  Erick Colace, M.D.DATE OF BIRTH:  August 22, 1941   DATE OF PROCEDURE:  DATE OF DISCHARGE:                               OPERATIVE REPORT   This is acupuncture treatment.  She had her last treatment done five  days ago.  Her pain levels are generally improved.  She has been able to  wean down on her OxyContin.  Her sleep is good.  She is continuing with  physical therapy twice a week, walking first thing in the morning.  She  has mainly morning stiffness and pain that are her main problems, right  flank and buttock pain.   Needles placed bilateral BL-21, BL-27, as well as right BL-23, BL-25,  with additional needles placed right GB-25, GB-26, right BL-54 and right  GB-30.  Electrical stim 4 Hz x30 minutes.  Patient tolerated the  procedure well.  See her back next week.  I have discussed coming off  the OxyContin 10 mg q.a.m., substituting Ultram ER 100 mg daily.  Discussed the serotonin syndrome possibility with concomitant usage of  Cymbalta.  She understands risks and benefits.      Erick Colace, M.D.  Electronically Signed     AEK/MEDQ  D:  04/14/2007 08:58:33  T:  04/14/2007 13:54:44  Job:  [id number]   cc:   Caralyn Guile. Ethelene Hal, M.D.  Fax: [phone]

## 2011-02-12 NOTE — Procedures (Signed)
[name], [name]                ACCOUNT NO.:  [id number]   MEDICAL RECORD NO.:  [id number]           PATIENT TYPE:   LOCATION:                                 FACILITY:   PHYSICIAN:  Erick Colace, M.D.DATE OF BIRTH:  1940-10-26   DATE OF PROCEDURE:  DATE OF DISCHARGE:                               OPERATIVE REPORT   TREATMENT #3:  Needles spaced bilaterally at DL-21 and EA-54, as well as  at right BL-23 and right BL-25 and right BL-27.  Additional needles  placed at right BL-54, right GB-30, right GB-26, right GB-25 and right  GB-30 prime at 2 Hz stimulation times 30 minutes.   The patient tolerated the procedure well.   I will see her back next week for repeat treatment.  Her current pain  level is 2/10.  She is reducing her hydrocodone break-through medicine  and continues on OxyContin 20 mg b.i.d.      Erick Colace, M.D.  Electronically Signed     AEK/MEDQ  D:  02/27/2007 13:19:48  T:  02/27/2007 13:57:20  Job:  [id number]

## 2011-02-12 NOTE — Procedures (Signed)
NAMEPACHIA, STRUM                ACCOUNT NO.:  [id number]   MEDICAL RECORD NO.:  [id number]          PATIENT TYPE:  REC   LOCATION:  TPC                          FACILITY:  [facility]   PHYSICIAN:  Erick Colace, M.D.DATE OF BIRTH:  04-29-1941   DATE OF PROCEDURE:  04/07/2007  DATE OF DISCHARGE:                               OPERATIVE REPORT   03/31/07  Ms. Carbine returns today acupuncture treatment #7.  She has been  reduced in terms of her OxyContin dosage 10 mg q.a.m.; however, her pain  level is now up to around 5/10 on average rather than 2/10.   Needles placed bilateral BL21, BL23, right BL25 and BL27.  Right GB30,  right GB26, right BL54 and right BL53, electrical stimulation 2 hours  x30 minutes.  We will add initial acupuncture treatment this week since  I will see her next week, Tuesday, and I will continue on the current  pain medications.  Have added auricular points on the right side, Satn-  Men as well as thalamus on the right.      Erick Colace, M.D.  Electronically Signed     AEK/MEDQ  D:  04/07/2007 12:16:58  T:  04/07/2007 15:01:42  Job:  [id number]

## 2011-02-12 NOTE — Procedures (Signed)
NAMEIMARA, Tanya Leon                ACCOUNT NO.:  [id number]   MEDICAL RECORD NO.:  [id number]          PATIENT TYPE:  REC   LOCATION:  TPC                          FACILITY:  [facility]   PHYSICIAN:  Erick Colace, M.D.DATE OF BIRTH:  10-06-1940   DATE OF PROCEDURE:  DATE OF DISCHARGE:                               OPERATIVE REPORT   HISTORY OF PRESENT ILLNESS:  This is a 70 year old female with lumbar  spinal listhesis with facet and discogenic pain.  She is here for her  fourth acupuncture treatment.   PROCEDURE:  Needle placed bilaterally at BL 21 and BL 29, right BL 23,  BL 25 and BL 27.  Right GB30, right BL 54, right BL 53, right GB26,  right BL50.  Electrical stimulation:  4 hertz times 30 minutes.   COMPLICATIONS:  The patient tolerated the procedure well.   PLAN:  Return in 2 to 3 days for repeat treatment.      Erick Colace, M.D.  Electronically Signed     AEK/MEDQ  D:  03/03/2007 13:36:35  T:  03/03/2007 14:03:15  Job:  [id number]   cc:   Sherilyn Cooter A. Pool, M.D.  Fax: [phone]   Matt Alucio

## 2011-02-12 NOTE — Procedures (Signed)
NAMEANVITA, HIRATA                ACCOUNT NO.:  [id number]   MEDICAL RECORD NO.:  [id number]          PATIENT TYPE:  REC   LOCATION:  TPC                          FACILITY:  [facility]   PHYSICIAN:  Erick Colace, M.D.DATE OF BIRTH:  01-18-1941   DATE OF PROCEDURE:  03/23/2007  DATE OF DISCHARGE:                               OPERATIVE REPORT   This is acupuncture treatment #6.  She has been responding to treatment,  able to stop her oxycodone at night, still on it in the morning.   Needles placed bilateral BL21 and BL29 as well as right BL23, BL25,  BL27, right GB25, GB26, GB30, as well as BL54 and BL53.  Electrical  stimulation 4 Hz x30 minutes.  Patient tolerated the procedure well.  Repeat treatment next week.  May be able to reduce the OxyContin to 10  mg in the morning after next visit.      Erick Colace, M.D.  Electronically Signed     AEK/MEDQ  D:  03/23/2007 12:31:21  T:  03/23/2007 15:34:37  Job:  [id number]   cc:   Stacie Glaze, MD  [address] [place]  [place] [address]   Caralyn Guile. Ethelene Hal, M.D.  Fax: [phone]   Ollen Gross, M.D.  Fax: [phone]

## 2011-02-12 NOTE — Procedures (Signed)
Tanya Leon, Tanya Leon                ACCOUNT NO.:  [id number]   MEDICAL RECORD NO.:  [id number]          PATIENT TYPE:  REC   LOCATION:  TPC                          FACILITY:  [facility]   PHYSICIAN:  Erick Colace, M.D.DATE OF BIRTH:  [birth year]   DATE OF PROCEDURE:  03/16/2007  DATE OF DISCHARGE:                               OPERATIVE REPORT   The patient returns today.  She has been able to stop her OxyContin  dosage at night.  She is sleeping well.  She is taking OxyContin 20 mg  q.a.m.  Her pain levels are still at a 1-2/10.  She is able to work full-  time.   Needles placed bilateral BL21, BL29 as well as right BL23, BL25, and  BL27.  The right GB 30, right BL 54.  Right GB26, right GB25, and right  BL52.  Electrical stimulation 4 Hz x30 minutes.  The patient tolerated  the procedure well.  Return next week for repeat procedure.  May  consider going down to 10 mg OxyContin at that time.      Erick Colace, M.D.  Electronically Signed     AEK/MEDQ  D:  03/16/2007 13:52:00  T:  03/16/2007 17:05:28  Job:  [id number]   cc:   Stacie Glaze, MD  [address] [place]  [place] [address]   Caralyn Guile. Ethelene Hal, M.D.  Fax: [phone]

## 2011-02-12 NOTE — Procedures (Signed)
NAMEDASHONNA, Tanya Leon                ACCOUNT NO.:  [id number]   MEDICAL RECORD NO.:  [id number]           PATIENT TYPE:   LOCATION:                                 FACILITY:   PHYSICIAN:  Erick Colace, M.D.DATE OF BIRTH:  [birth year]   DATE OF PROCEDURE:  DATE OF DISCHARGE:                               OPERATIVE REPORT   TREATMENT:  DATE 04/10/07  Acupuncture treatment #8.   INDICATIONS:  Lumbar spinal listhesis.  Has been narcotic analgesic  dependent for several years.  She is down to 10 mg OxyContin CR q.a.m.  Her pain is now down to 3-4/10.  This is down from the level it was two  days ago at 5-6/10.   Treatment today consists of points placed at bilateral BL-21, BL-27 as  well as right BL-25 and BL-23 and points placed at right GB-26, right GB-  30 and GB-30 prime as well as BL-54.  Each stim was at 4 Hz x30 minutes.  Patient tolerated the procedure well.  Return next week for repeat  treatment.  Continue on OxyContin 10 mg daily and then maybe able to  just stop it.      Erick Colace, M.D.  Electronically Signed     AEK/MEDQ  D:  04/10/2007 14:10:18  T:  04/11/2007 12:49:16  Job:  [id number]

## 2011-02-12 NOTE — Procedures (Signed)
NAME:  Tanya Leon, Tanya Leon                ACCOUNT NO.:  [id number]   MEDICAL RECORD NO.:  [id number]           PATIENT TYPE:   LOCATION:                                 FACILITY:   PHYSICIAN:  Erick Colace, M.D.   DATE OF BIRTH:   DATE OF PROCEDURE:  06/29/2007  DATE OF DISCHARGE:                               OPERATIVE REPORT   A needle was placed, bilateral BL21, BL27, as well as BL23, BL25 and  along the paraspinal muscles as well as GB30, GB25 adjacent to the right-  sided needles.   Electrical stimulation to 4 Hz for 30 minutes.  The patient tolerated  the procedure well.  Return in one month.      Erick Colace, M.D.  Electronically Signed     AEK/MEDQ  D:  06/29/2007 11:21:19  T:  06/29/2007 12:25:55  Job:  [id number]

## 2011-02-12 NOTE — Op Note (Signed)
Tanya Leon, Tanya Leon                ACCOUNT NO.:  [id number]   MEDICAL RECORD NO.:  [id number]          PATIENT TYPE:  INP   LOCATION:  3033                         FACILITY:  [facility]   PHYSICIAN:  Sherilyn Cooter A. Pool, M.D.    DATE OF BIRTH:  02-08-1941   DATE OF PROCEDURE:  02/16/2008  DATE OF DISCHARGE:                               OPERATIVE REPORT   PREOPERATIVE DIAGNOSIS:  Grade 1 L4-L5 degenerative spondylolisthesis  with stenosis.   POSTOPERATIVE DIAGNOSIS:  Grade 1 L4-L5 degenerative spondylolisthesis  with stenosis.   PROCEDURE:  1. L4-L5 decompressive laminectomy and L4 and L5 foraminotomies,      bilateral, more than would be required for simple interbody fusion      alone.  2. L4-L5 posterior lumbar fusion utilizing Tangent interbody allograft      wedge, Telamon interbody PEEK cage, and local autografting.  3. L4-L5 posterolateral arthrodesis utilizing nonsegmental pedicle      screw fixation and local autografting.   SURGEON:  Kathaleen Maser. Pool, MD   ASSISTANT:  Reinaldo Meeker, MD   ANESTHESIA:  General endotracheal.   INDICATIONS FOR PROCEDURE:  Ms. Christenberry is a 70 year old female with  history of back and bilateral lower extremity pain failing conservative  management.  Workup demonstrates evidence of a progressive grade 1 L4-L5  degenerative spondylolisthesis with severe stenosis.  The patient  presents for decompression and fusion in hopes of improving her  symptoms.   OPERATIVE NOTE:  The patient was taken to the operating room and placed  on the operating table in supine position.  After adequate level of  anesthesia was achieved, the patient was placed in prone onto the Wilson  frame.  Appropriately padded the patient's lumbar regions, prepped and  draped sterilely.  A 10 blade was used to make a curvilinear skin  incision, overlying the L3, L4, and L5 levels.  The skin was entered  sharply in the midline.  Subperiosteal dissection was then performed  exposing the  lamina and facet joints of L3, L4, and L5 as well as  transverse processes of L4 and L5.  Deep self-retaining retractor was  placed.  Intraoperative x-rays taken and level was confirmed.  Decompressive laminectomy was then performed using Leksell rongeurs,  Kerrison rongeurs, and high-speed drill to remove the entire lamina of  L4, inferior facets of L4 bilaterally, and superior facets of L5 lamina  bilaterally as well as the superior aspects of the lamina of L5.  Ligamentum flavum was then elevated and resected in usual fashion using  Kerrison rongeur.  Wide decompressive foraminotomies were then performed  along the course of exiting L4 and L5 nerve roots bilaterally.  Epidural  venous plexus was coagulated and cut.  Starting first on the patient's  left side, thecal sac and nerve root were gently mobilized and tracked  towards the midline.  Disk space was then incised with 15 blade in a  rectangular fashion to widen the disk space.  Clean-out was achieved  using pituitary rongeurs, upwardly-angled pituitary rongeurs, and  Epstein curettes.  Procedure was then repeated on the contralateral  side.  Disk space was then sequentially dilated up to 9 mm with 9 mm  distractor left on the patient's right side.  Thecal sac and nerve root  were once again protected on the left side.  Disk space was then reamed  and cut with 8 mm tangent instruments.  Soft tissue was then removed  from the interspace.  An 8 x 26 mm Tangent wedge was then impacted into  place and recessed roughly 1 mm from the posterior cortical margin of  the L4 body.  Distractor was removed from the patient's right side.  Thecal sac and nerve root were once again protected on the right side.  Disk space was then reamed and then cut with 8 mm Tangent instrument.  Soft tissues were then removed from the interspace.  Morselized  autograft was then packed in the interspace and mixed with Progenix  putty.  A 8 x 22 mm Telamon cage  packed with morselized autograft and  Progenix putty was then impacted into place and recessed roughly 2 mm at  the posterior cortical margin of L4.  Pedicles of L4 and L5 were  identified using surface landmarks and intraoperative fluoroscopy.  Superficial bone around the pedicle was then removed using high-speed  drill.  Each pedicle was then probed using pedicle awl.  Each pedicle  awl tract was then tapped with 5.25 mm screw tapper.  Each screw tap  hole was probed and found to be solid within bone.  A 6.75 x 45 mm  radius screws were placed bilaterally at L4 and L5.  Transverse  processes of L4 and L5 were then decorticated using high-speed drill.  Morselized autograft was packed posterolaterally for later fusion.  Short segment titanium rods were placed over the screw heads at L4 and  L5.  The locking caps were placed over the screw heads.  The locking  caps were then engaged with a construct under compression.  Final images  revealed good position of bone graft at all appropriate level with  normal alignment of the spine.  Wounds were then irrigated with  antibiotic solution.  Gelfoam was placed topically for hemostasis and  found to be good.  A mini Hemovac drain was left in the epidural space.  The wounds were then closed in layers with Vicryl suture.  Steri-Strips  and sterile dressings were applied.  There were no complications.  The  patient tolerated the procedure well and she returned to recovery room  postoperatively.           ______________________________  Kathaleen Maser Pool, M.D.     HAP/MEDQ  D:  02/16/2008  T:  02/17/2008  Job:  [id number]

## 2011-02-12 NOTE — Procedures (Signed)
NAMEEMBER, HENRIKSON                ACCOUNT NO.:  [id number]   MEDICAL RECORD NO.:  [id number]          PATIENT TYPE:  REC   LOCATION:  TPC                          FACILITY:  [facility]   PHYSICIAN:  Erick Colace, M.D.DATE OF BIRTH:  Aug 30, 1941   DATE OF PROCEDURE:  04/28/2007  DATE OF DISCHARGE:                               OPERATIVE REPORT   PROCEDURE:  Acupuncture treatment #11.   Patient is off OxyContin.  She is maintained on Ultram ER 100 mg a day,  although she thinks that she may need to go up on this a little bit.  No  side effects from medications.   Procedure:  A needle was placed bilaterally at BL-21, BL-27 as well as  right BL-23, BL-25, right BL-54, right GB-30, right GB-25, right GB-26,  electrical stimulation 4 hertz x30 minutes.  The patient tolerated  procedure well.  I will see her back in 2 weeks at 2 week intervals for  the next 2 visits and we may extend to 1 month.      Erick Colace, M.D.  Electronically Signed     AEK/MEDQ  D:  04/28/2007 09:02:28  T:  04/28/2007 15:20:45  Job:  [id number]   cc:   Caralyn Guile. Ethelene Hal, M.D.  Fax: [phone]

## 2011-02-12 NOTE — Procedures (Signed)
Tanya Leon, Tanya Leon                ACCOUNT NO.:  [id number]   MEDICAL RECORD NO.:  [id number]          PATIENT TYPE:  REC   LOCATION:  TPC                          FACILITY:  [facility]   PHYSICIAN:  Erick Colace, M.D.DATE OF BIRTH:  1941-08-14   DATE OF PROCEDURE:  07/28/2007  DATE OF DISCHARGE:                               OPERATIVE REPORT   PROCEDURE:  Acupuncture treatment.   Needles placed bilateral BL-21, BL-27, right BL-23, BL-25, paraspinal  muscles adjacent x2 as well as GB-30 and GB-25 on the right side at 4 Hz  x30 minutes. The patient will return in one month.  This appears to be  the optimal spacing of her treatments.      Erick Colace, M.D.  Electronically Signed     AEK/MEDQ  D:  07/29/2007 13:24:59  T:  07/29/2007 23:18:22  Job:  [id number]   cc:   Karyl Kinnier  Fax: [phone]   Stacie Glaze, MD  [address] [employer]  [place] [address]

## 2011-02-15 NOTE — Op Note (Signed)
Tanya Leon, Tanya Leon                ACCOUNT NO.:  [id number]   MEDICAL RECORD NO.:  [id number]          PATIENT TYPE:  INP   LOCATION:  9399                          FACILITY:  [facility]   PHYSICIAN:  Pershing Cox, M.D.DATE OF BIRTH:  Jul 23, 1941   DATE OF PROCEDURE:  04/22/2006  DATE OF DISCHARGE:                                 OPERATIVE REPORT   PREOPERATIVE DIAGNOSIS:  Urinary retention and pelvic mass.   POSTOPERATIVE DIAGNOSIS:  Ruptured diverticular abscess.   PROCEDURE:  Exploratory laparotomy, supracervical hysterectomy, bilateral  salpingo-oophorectomy.   SURGEON:  1.  Dr. Carey Bullocks with Dr. Cherly Hensen as assistant.  2.  Gertie Gowda colectomy, Dr. Colin Benton with Dr. Shon Hale as assistant.   ANESTHESIA:  General endotracheal.   INDICATIONS FOR PROCEDURE:  Tanya Leon is a 70 year old female who is  followed in my office for gynecologic care.  She had a GYN examination about  2 weeks ago at which time her uterine fundus was felt to be the same size  but her cervix was way high behind the symphysis.  She was warned of the  possibility of urinary retention and sure enough presented to the emergency  room on Saturday of this week with urinary retention.  Foley catheter was  placed and 1400 mL of clear urine were evacuated.  The patient had no signs  of urinary tract infection.  The Foley catheter was left in and she was seen  in my office yesterday.  Ultrasound was performed to look at the uterus  which was small with a normal stripe but there was an 8 x 8 x 7 cm mass  filling the cul-de-sac.  The ovaries could not be delineated.  Because of  the patient's pelvic mass and her urinary retention, the decision was made  to proceed with operative intervention today.  The patient had been without  solid food for 24 hours.  Over the evening last night, she took in a liter  of NuLytely and half of mag citrate with no return whatsoever.  She  attempted to perform some enema evacuation this morning  with limited  success.   OPERATIVE FINDINGS:  Exploratory laparotomy showed a normal size uterus,  normal ovaries and normal fallopian tubes.  There was no ascites.  There  were no peritoneal implants.  There was a large mass filling the cul-de-sac  and adherent to the back wall of the uterus.  Sharp dissection was used to  try to separate this mass from the posterior wall of the uterus and  mucopurulent material was immediately encountered.  Culture was obtained as  well as gram stain.  General surgery was contacted and Dr. Colin Benton came to the  operating room to evaluate the patient and then to perform a Hartmann  colectomy.  During the evaluation of the patient, it became apparent that in  order to completely evaluate her it would be beneficial to remove the  fallopian tubes and ovaries.  And that is why that procedure was performed.  Also once a decision was made to perform a colostomy with hopes of  anastomosis later, the  decision was made to perform a supracervical  hysterectomy so that there would be better access to the distal rectum.   PROCEDURE:  Tanya Leon was brought to the operating room with an IV in  place.  In the holding area, she had received a gram of Ancef.  On the OR  table.  She received ampicillin and gentamicin and then once the  diverticular abscess was noted she received 900 of clindamycin.  PAS  stockings were placed on her lower extremities and these extended to the  thighs.   The patient was brought into the operating room and positioned on the  operating room table so that her neck was in a comfortable position.  A  pillow was placed beneath her legs.  She received IV sedation and then  general endotracheal anesthesia without difficulty.  She was placed in the  modified frog-leg position and Betadine was used to prep the anterior  abdominal wall, perineum and vagina.  Foley catheter had been removed and  was replaced at this time sterilely.  The patient was  draped for sterile  midline incision.  Marcaine 0.25%was injected into the subumbilical incision  and an 8 cm incision was made, carried down through the subcutaneous tissues  until the fascia was encountered.  The fascia was opened with cautery and  the peritoneum was tented after the rectus muscles had been separated in the  midline.  The peritoneum was opened and there was no evidence of ascites.  Peritoneal incision was extended and 500 mL of warm saline were instilled in  the peritoneal cavity, agitated and then retrieved for washings.  The  peritoneal incision was extended to the dome of the bladder.  Upper  abdominal exploration was performed with no abnormal findings.  Balfour  retractor was placed and the pelvis was inspected.  The uterus was pushed  far anteriorly by a large pelvic mass.  Both fallopian tubes and ovaries  were totally normal.  Using sharp dissection.  The uterus was carefully  separated from this posterior mass and a large amount of yellow mucopurulent  material was immediately apparent.  This was collected for culture and Gram  stain and volume of fluid was aspirated.  Once enough had been decompressed  so that there was no more spillage, the warm laps were placed along the  pelvis and towels were used to cover the incision and eventually Dr. Colin Benton  came to help evaluate the situation.  Later the operation, the decision was  made to perform a supracervical hysterectomy to help facilitate and the  anticipated colostomy closure later on.   The IP ligaments were lifted.  The ureter was palpated beneath the ligament  and a right angle was used to pierce the tissue beneath the  infundibulopelvic.  The ligament was clamped, cut, suture free tie ligated.  The broad ligament beneath the fallopian tube was incised and brought down  to the level of the uterus where it was transected from the uterus.  The  fundus being held with long Kelly clamps.  Later in the  procedure, a supracervical hysterectomy was performed.  Round  ligaments were suture ligated and transected with cautery.  The round  ligaments were incised and peritoneum over the lower uterine segment was  separated and bladder was lifted off the lower uterine segment.  Uterine  arteries were skeletonized, doubly clamped and suture ligated on each side  of the specimen.  We tried to limit the remaining cervix to approximately  3  cm and therefore a Heaney clamp was placed on each side and suture ligature  was used along the cut surface of the cardinal ligament.  Hemostasis was  excellent.  A malleable retractor was placed behind the uterus and the  cervix was transected.  Cautery was used to maintain hemostasis and four 0  Vicryl sutures were used to close the remaining cervix.  Hemostasis was  excellent.   For the remainder of the patient's operative procedure including closure and  maturation of the colostomy as well as in the sigmoid resection, please see  the dictation by Dr. Colin Benton.      Pershing Cox, M.D.  Electronically Signed     MAJ/MEDQ  D:  04/22/2006  T:  04/23/2006  Job:  [id number]   cc:   Gita Kudo, M.D.  [address], [address]  [place]  [place] [address]   Alfonse Ras, MD  [phone], [address]  [place]  [place] [address]   Maxie Better, M.D.  Fax: [phone]

## 2011-02-15 NOTE — Discharge Summary (Signed)
NAMEGENEVER, HENTGES                ACCOUNT NO.:  [id number]   MEDICAL RECORD NO.:  [id number]          PATIENT TYPE:  INP   LOCATION:  1511                         [facility]ACILITY:  [facility]   PHYSICIAN:  Alfonse Ras, MD   DATE OF BIRTH:  1941-06-15   DATE OF ADMISSION:  04/22/2006  DATE OF DISCHARGE:  05/03/2006                                 DISCHARGE SUMMARY   ADMITTING DIAGNOSIS:  Perforated diverticulitis.   DISCHARGE DIAGNOSIS:  Perforated diverticulitis.   HOSPITAL COURSE:  Patient was admitted by Dr. Gildardo Griffes with urinary  retention and a pelvic mass.  An abdominal exploration and was called for  intraoperative consultation.  Found the patient to have a perforated colon  into a very large pelvic abscess.  I performed a Hartmann procedure which  included a colostomy.  Patient was transferred then from [facility] to  [facility].  NG tube remained in place because of a significant  ileus for approximately nine days postoperatively.  NG tube was removed at  that point after she had been on TNA.  She began having stool of her  colostomy and was started on clears.  Slowly her diet was advanced which she  was tolerating well and she was ready for discharge home on postoperative  day #11.  Follow-up is with me in two weeks.   MEDICATIONS:  Vicodin for pain and her other preoperative medications.   DISPOSITION:  Discharge to home.   CONDITION ON DISCHARGE:  Good and improved.   FINAL DIAGNOSES:  Perforated diverticulitis.      Alfonse Ras, MD  Electronically Signed     KRE/MEDQ  D:  05/14/2006  T:  05/14/2006  Job:  [id number]   cc:   Pershing Cox, M.D.  Fax: [phone]

## 2011-02-15 NOTE — Discharge Summary (Signed)
NAMEMIKAILA, Tanya Leon                ACCOUNT NO.:  [id number]   MEDICAL RECORD NO.:  [id number]          PATIENT TYPE:  INP   LOCATION:  1609                         FACILITY:  [facility]   PHYSICIAN:  Alfonse Ras, MD   DATE OF BIRTH:  Oct 21, 1940   DATE OF ADMISSION:  07/28/2006  DATE OF DISCHARGE:  08/04/2006                               DISCHARGE SUMMARY   ADMISSION DIAGNOSIS:  Status post colostomy, status post Hartmann  procedure.   DISCHARGE DIAGNOSIS:  Status post colostomy, status post Hartmann  procedure.   DISPOSITION:  Good and improved.  Discharge to home.   MEDICATIONS:  Vicodin and preoperative home medications.   HOSPITAL COURSE:  Patient was admitted after home bowel prep, and  underwent reversal of a Hartmann colostomy.  She did well and had a  persistent ileus for a number of days.  However, by postoperative day  number 4, she was started on clear liquids, and was tolerating those  well.  By postoperative day number 5, she had a midline wound infection  which was opened by Dr. Abbey Chatters, and started on twice daily dressing  changes.  By postoperative day number 7, she was tolerating her dressing  changes, and was ready for discharge home.  Follow up is with me in 2  weeks.   DISPOSITION:  Discharge to home.      Alfonse Ras, MD  Electronically Signed     KRE/MEDQ  D:  08/04/2006  T:  08/04/2006  Job:  [id number]

## 2011-02-15 NOTE — H&P (Signed)
NAMETMYA, WIGINGTON                ACCOUNT NO.:  [id number]   MEDICAL RECORD NO.:  [id number]          PATIENT TYPE:  INP   LOCATION:  1609                         FACILITY:  [facility]   PHYSICIAN:  Alfonse Ras, MD   DATE OF BIRTH:  Jun 24, 1941   DATE OF ADMISSION:  07/28/2006  DATE OF DISCHARGE:  08/04/2006                              HISTORY & PHYSICAL   HISTORY OF PRESENT ILLNESS:  The patient is a pleasant 70 year old white  female who was found to have perforated diverticulitis about 3-4 months  prior to this. She underwent a Hartman resection and now presents for a  laparoscopic assisted takedown of her colostomy.  The patient underwent  home bowel prep and now presents for reversal.   PAST MEDICAL HISTORY:  Significant only for chronic back pain and the  above-mentioned surgery.   MEDICATIONS:  OxyContin, Vicodin, multivitamin, Omega 3.   SOCIAL HISTORY:  Negative for tobacco or alcohol use.   PHYSICAL EXAM:  GENERAL:  She is an age-appropriate white female in no  distress.  HEENT:  Exam is benign.  Normocephalic, atraumatic. Pupils  equal round and reactive to light and accommodation.  LUNGS:  Clear to auscultation and percussion x2.  NECK:  Supple and soft without thyromegaly or cervical adenopathy.  HEART:  Regular rate and rhythm without murmurs, rubs or gallops.  ABDOMEN:  Shows a colostomy in the left lower quadrant.  Otherwise is  soft with no evidence of hernia.  EXTREMITIES:  Show no clubbing, cyanosis or edema.   IMPRESSION:  Status post Hartmann resection with colostomy.   PLAN:  Laparoscopic takedown of her colostomy.      Alfonse Ras, MD  Electronically Signed     KRE/MEDQ  D:  10/07/2006  T:  10/07/2006  Job:  [id number]

## 2011-02-15 NOTE — H&P (Signed)
NAME:  Tanya Leon, Tanya Leon                    ACCOUNT NO.:  0   MEDICAL RECORD NO.:  [id number]           PATIENT TYPE:   LOCATION:                                 FACILITY:   PHYSICIAN:  Pershing Cox, M.D.DATE OF BIRTH:  [birth year]   DATE OF ADMISSION:  04/22/2006  DATE OF DISCHARGE:                                HISTORY & PHYSICAL   REASON FOR ADMISSION:  Acute urinary retention and pelvic mass.   HISTORY OF PRESENT ILLNESS:  The patient is a 70 year old, married,  Caucasian female who is G2, P2.  She was seen in my office for annual  examination on April 08, 2006.  At that time, her cervix was very high behind  the symphysis making it difficult to perform a Pap smear.  The uterus was  markedly retroverted.  At that time, I did not think that there was a  significant increase in the size of the uterus and we had difficulty  evaluating her cervix the year before.  In 2005 and 2006, she had pelvic  ultrasound.  She was warned of the possibility of urinary retention and was  to call if she had any increased symptoms.  This Saturday, April 19, 2006,  she presented to [facility] Emergency Room with complaints of abdominal  pain, constipation and inability to empty her bladder x24 hours.  She was  evaluated at [facility] and Foley catheter was placed with over  1000 mL of urine retrieved by Foley catheter.  A leg bag was connected and  she was sent to me today for followup.  She had laboratory data, while at  [facility], showing a hemoglobin of 10.  She also had a chest x-ray  showing mild cardiomegaly.  In my office on July 23, pelvic sonogram was  done which shows an 8 x 8 cm, smooth mass located in the cul-de-sac.  There  is debris in the inner surface of the mass, but there is no abnormal blood  flow.  The uterus is stretched across the top of this mass.  Again, the  cervix is high behind the symphysis and the uterus is markedly retroverted.  There is no cul-de-sac  nodularity.   The patient and her husband were seen following the completion of the  ultrasound and recommendation was made that we proceed emergently with  hysterectomy.  Fortunately, the patient had not had any solid food to eat  today so that we could begin a bowel prep.  The risks of the procedure were  outlined in detail after defining the entire procedure including the  possibility of lymph node dissection and omentectomy.   ALLERGIES:  No known drug allergies.   MEDICATIONS:  1.  Fem-Hart full dose one p.o. daily.  2.  OxyContin 20 mg two p.o. daily.  3.  Hydrocodone/acetaminophen 5/400 p.r.n. breakthrough pain.  4.  Effexor XR 75 mg three tablets p.o. daily.  5.  Celebrex 200 mg daily.  6.  Norvasc 2.5 mg daily.  7.  Zanaflex 2 mg daily.   PAST  MEDICAL HISTORY:  1.  The patient has cervical joint disease.  She is being evaluated for the      possibility of disc surgery in the near future.  She is followed by Dr.      Bradly Chris who is helping with pain management.  2.  Mitral valve prolapse.  3.  History of vascular headaches.  4.  History of osteopenia.  5.  History of colon polyps.   PAST SURGICAL HISTORY:  1.  Tonsillectomy.  2.  Bilateral tubal ligation.  3.  Cataract surgery.   FAMILY HISTORY:  The patient's mother had breast cancer, congestive heart  failure, MI and colon cancer.  The patient's father has Alzheimer's disease  and a pacemaker.  Her brother has hypertension and diverticulitis and a  sister has seizures.   SOCIAL HISTORY:  The patient is married.  She works as a IT trainer.  She gets her  exercise by walking 20-40 minutes every day.  She supplements her calcium.  She has two children.   REVIEW OF SYSTEMS:  The patient's review of systems is generally negative.  GASTROINTESTINAL:  She has had problems with constipation since beginning  narcotic pain medication.  She finds that she has constipation and sometimes  this is followed by diarrhea.   GENITOURINARY:  She has had urinary retention  for the last 4 days.  MUSCULOSKELETAL:  Back pain associated with her  cervical disc disease and is in the process of being treated with narcotic  pain medication.  She is hoping to avoid surgery.   PHYSICAL EXAMINATION:  VITAL SIGNS:  Weight 145 pounds, height 61.94 inches.  Body surface area 1.67 sq m, body mass index 26.57 kg/sq m.  Pulse 74, BP  110/70.  GENERAL:  The patient is a well-developed, well-nourished, Caucasian female  in no acute distress.  HEENT:  Normocephalic, anicteric.  EOMI.  PERRLA.  NECK:  Supple.  Thyroid is normal to palpation.  There are no carotid  bruits.  BACK:  No CVA or spinal tenderness.  LUNGS:  Breath sounds are normal.  CHEST:  The chest has a normal shape.  BREASTS:  No palpable masses.  CARDIAC:  Regular rate and rhythm without murmur.  ABDOMEN:  Soft, nontender, no guarding, no rebound, no hepatosplenomegaly.  PELVIC:  Normal external examination.  On speculum examination, the cervix  cannot be visualized.  It can be palpated high behind the symphysis pubis.  There is a retroverted uterine mass and this is smooth on rectovaginal  examination.  The vaginal walls are normal in appearance.  Sonogram shows an  8 x 7 x 8 cm complex mass behind the uterus.  There are small uterine  myomas.  The endometrial strip is normal in appearance.   LABORATORY DATA AND X-RAY FINDINGS:  Laboratory data obtained from the  patient's recent visit to the emergency room at [facility] shows a  hemoglobin of 10.9, platelet count 430.  The remainder of the hematology is  normal.  Sodium 133, potassium 3.5, chloride 103, BUN 5, glucose 99.  Urinalysis normal.   The patient's chest x-ray shows cardiomegaly with bibasilar atelectasis or  scarring.   ASSESSMENT:  1.  Acute urinary retention.  2.  Pelvic mass.  3.  Uterine myomas.  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      Pershing Cox, M.D.  Electronically Signed     MAJ/MEDQ  D:  04/21/2006  T:  04/21/2006  Job:  [id number]

## 2011-02-15 NOTE — Op Note (Signed)
NAMETALIBAH, COLASURDO                ACCOUNT NO.:  [id number]   MEDICAL RECORD NO.:  [id number]         PATIENT TYPE:  LINP   LOCATION:                               [facility]AC[facility]   PHYSICIAN:  Pershing Cox, M.D.DATE OF BIRTH:  01/05/1941   DATE OF PROCEDURE:  DATE OF DISCHARGE:  05/03/2006                               OPERATIVE REPORT   AMENDMENT:  After reviewing the patient's chart, a request was made from  the Quality Assurance Committee that I would amend my operative note to  discuss the details of the patient's abdominal closure.  The details of  this case are as follows.  The patient was explored by me at [facility] when a perforated diverticular abscess was discovered.  Dr.  Colin Benton was called to the operating room and addressed this problem.  Because she had to leave the operating room, Dr. Maryagnes Amos came to help  complete the case and did the abdominal closure and maturation of the  colostomy.  I was his assistant for this case.  This dictation is being  done on October 20, 2006.  The details of the abdominal wall closure and  colostomy maturation cannot possibly be remembered over this period of  time.  The patient later had a colostomy revision which was approached  by laparoscopy but then addressed through a midline incision.  This  operative note does not give any details of the specific suture that had  been placed previously by Dr. Maryagnes Amos.      Pershing Cox, M.D.     MAJ/MEDQ  D:  10/20/2006  T:  10/20/2006  Job:  [id number]   cc:   Zenaida Niece, M.D.  Fax: [phone]   Gita Kudo, M.D.  [address], [address]  [place]  [place] [address]

## 2011-02-15 NOTE — Op Note (Signed)
Tanya Leon, Tanya Leon                ACCOUNT NO.:  [id number]   MEDICAL RECORD NO.:  [id number]          PATIENT TYPE:  INP   LOCATION:  1609                         FACILITY:  [facility]   PHYSICIAN:  Alfonse Ras, MD   DATE OF BIRTH:  1941/07/05   DATE OF PROCEDURE:  07/28/2006  DATE OF DISCHARGE:                               OPERATIVE REPORT   PREOPERATIVE DIAGNOSIS:  Colostomy, status post Hartmann procedure.   POSTOPERATIVE DIAGNOSIS:  Colostomy, status post Hartmann procedure.   PROCEDURE:  Laparoscopic-assisted tak[place] of colostomy.   SURGEON:  Alfonse Ras, M.D.   ASSISTANT:  Lebron Conners, M.D.   ANESTHESIA:  General.   DESCRIPTION:  The patient was taken to the operating room and placed in  the supine position. After adequate general anesthesia was induced using  endotracheal tube, the abdomen was prepped and draped in the normal  sterile fashion. Using an 11 mm trocar in the right upper quadrant,  pneumoperitoneum was obtained under direct vision with the OptiVu.  Pneumoperitoneum was then obtained.  Under direct vision, additional 5  mm trocars were placed in the abdomen.  A number of omental adhesions  were taken down from the anterior abdominal wall.  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  The rectal  stump was identified with the Prolene sutures.  However, there were a  significant amount of adhesions of small bowel to this.  For that  reason, I opted to make a lower vertical midline incision and dissect  down to the fascia and into the peritoneum.  These adhesions were taken  down to the rectal wall.  On doing so, a small hole was made along the  staple line, and the rectum was then transected about 3 cm distal to  that with a TA blue load 60 mm stapling device.   I then turned my attention to the colostomy.  The colostomy was  mobilized from the anterior abdominal wall.  An elliptical incision was  made around the  previously closed colostomy, and the colostomy was  mobilized off the fascia and delivered back into the abdomen.  It was  transected using a TA-60 stapling device as well.  It was laid down and  stapled in a side-to-side fashion next to the rectum.  Small  enterotomies were made, and a GIA stapler was fired.  This gave an  adequate anastomosis.  The defect was then closed again with a TA-60 mm  stapling device.  The staple line was reinforced with Tisseel.  The  abdomen was copiously irrigated.  The colostomy fascial defect was  closed with interrupted #1 Novofils, and the lower abdominal wall wound  was closed with running #1 Novofil.  Skin incisions were closed with  staples.  The patient tolerated the procedure well and went to the PACU  in good condition.     Alfonse Ras, MD  Electronically Signed    KRE/MEDQ  D:  07/28/2006  T:  07/29/2006  Job:  [id number]

## 2011-02-15 NOTE — Op Note (Signed)
Tanya Leon, Tanya Leon                ACCOUNT NO.:  [id number]   MEDICAL RECORD NO.:  [id number]          PATIENT TYPE:  INP   LOCATION:  0159                         FACILITY:  [facility]   PHYSICIAN:  Alfonse Ras, MD   DATE OF BIRTH:  07-13-1941   DATE OF PROCEDURE:  04/22/2006  DATE OF DISCHARGE:                                 OPERATIVE REPORT   PREOPERATIVE DIAGNOSIS:  Questionable ovarian mass.   POSTOPERATIVE DIAGNOSIS:  Perforated diverticular abscess.   OPERATION PERFORMED:  Exploratory laparotomy and sigmoid colectomy with end  colostomy, placement of drains.   SURGEON:  Alfonse Ras, MD   ASSISTANT:  Gita Kudo, M.D.   BRIEF HISTORY:  I was called into the operating room by Pershing Cox,  M.D. and asked to do an exploration for questionable ovarian mass.  The  patient was found to have a large abscess in the pelvis.   ANESTHESIA:   DESCRIPTION OF PROCEDURE:  On entering the operating room, Dr. Carey Bullocks had  significant exposure. I extended this superiorly and noticed a very large  abscess just posterior to the uterus and involving the distal sigmoid colon.  The patient had a significant amount of stool in the proximal colon and  significant diverticulosis of the sigmoid colon.  Using finger fracture  technique, the abscess cavity was opened.  There was a significant amount of  purulence that was suctioned out and cultured.  It had a very thick peel and  involved about 12 to 14 inches of the sigmoid colon.  I transected the  sigmoid colon using a GIA stapling device and took down the mesentery using  Kelly clamps.  The distal rectosigmoid junction was identified and  transected with a TA stapling device.  It was tagged with Prolene sutures.  There was a small rent in the mucosa and this was oversewn with 2-0 silk  ligatures.  The entire mesentery of the sigmoid colon that had been  transected was taken down with Kelly clamps and ligated with 2-0 silk  ligatures.  Specimen was sent for pathologic evaluation.  The left fallopian  tube and ovary were extensively involved within the abscess cavity as well.  Dr. Carey Bullocks removed those and will dictate that in a separate operative  report.  The proximal descending colon was then brought out through a  cruciate incision in the left anterior rectus fascia and through the skin  with colostomy maturation.  The abdomen was copiously irrigated.  Dr.  Carey Bullocks and Dr. Maryagnes Amos finished the hysterectomy and that will also be  dictated in a separate note.  The closure should be dictated by Dr. Carey Bullocks.      Alfonse Ras, MD  Electronically Signed    KRE/MEDQ  D:  04/22/2006  T:  04/23/2006  Job:  [id number]

## 2011-03-01 ENCOUNTER — Other Ambulatory Visit (INDEPENDENT_AMBULATORY_CARE_PROVIDER_SITE_OTHER): Payer: Medicare Other

## 2011-03-01 DIAGNOSIS — E785 Hyperlipidemia, unspecified: Secondary | ICD-10-CM

## 2011-03-01 LAB — BASIC METABOLIC PANEL
BUN: 16 mg/dL (ref 6–23)
Calcium: 9.2 mg/dL (ref 8.4–10.5)
Creatinine, Ser: 0.8 mg/dL (ref 0.4–1.2)
GFR: 78.49 mL/min (ref >60.00)
Glucose, Bld: 87 mg/dL (ref 70–99)

## 2011-03-01 LAB — HEPATIC FUNCTION PANEL
ALT: 12 U/L (ref 0–35)
AST: 18 U/L (ref 0–37)
Bilirubin, Direct: 0.2 mg/dL (ref 0.0–0.3)
Total Bilirubin: 0.8 mg/dL (ref 0.3–1.2)

## 2011-03-05 ENCOUNTER — Other Ambulatory Visit: Payer: Self-pay | Admitting: *Deleted

## 2011-03-05 MED ORDER — PREGABALIN 75 MG PO CAPS: 75.0000 mg | ORAL_CAPSULE | Freq: Two times a day (BID) | ORAL | Status: DC

## 2011-03-05 MED ORDER — ZOLPIDEM TARTRATE 5 MG PO TABS: 5.0000 mg | ORAL_TABLET | Freq: Every evening | ORAL | Status: DC | PRN

## 2011-03-08 ENCOUNTER — Other Ambulatory Visit: Payer: Self-pay | Admitting: Surgery

## 2011-03-08 ENCOUNTER — Telehealth: Payer: Self-pay | Admitting: Internal Medicine

## 2011-03-08 ENCOUNTER — Ambulatory Visit: Payer: Medicare Other | Admitting: Internal Medicine

## 2011-03-08 DIAGNOSIS — K469 Unspecified abdominal hernia without obstruction or gangrene: Secondary | ICD-10-CM

## 2011-03-08 NOTE — Telephone Encounter (Signed)
Pt called and is req to get copy of labs from last Friday, sent Edie at Dr Tillman Abide office fax# [phone]. Need this sent asap.

## 2011-03-08 NOTE — Telephone Encounter (Signed)
faxed

## 2011-03-11 ENCOUNTER — Encounter: Payer: Self-pay | Admitting: Internal Medicine

## 2011-03-11 ENCOUNTER — Ambulatory Visit (INDEPENDENT_AMBULATORY_CARE_PROVIDER_SITE_OTHER): Payer: Medicare Other | Admitting: Internal Medicine

## 2011-03-11 VITALS — BP 110/70 | HR 72 | Temp 98.1°F | Resp 14 | Ht 62.5 in | Wt 136.0 lb

## 2011-03-11 DIAGNOSIS — M545 Low back pain, unspecified: Secondary | ICD-10-CM

## 2011-03-11 DIAGNOSIS — I10 Essential (primary) hypertension: Secondary | ICD-10-CM

## 2011-03-11 DIAGNOSIS — M48061 Spinal stenosis, lumbar region without neurogenic claudication: Secondary | ICD-10-CM

## 2011-03-11 DIAGNOSIS — E7800 Pure hypercholesterolemia, unspecified: Secondary | ICD-10-CM

## 2011-03-11 NOTE — Progress Notes (Signed)
Subjective:    Patient ID: Tanya Leon, female    DOB: [birth year], 70 y.o.   MRN: [id number]  HPI The pts eye disorder has not improved. Great blood pressure. The pt had labs prior that we reviewed 4 total cholesterol liver function and blood count as well as basic metabolic panel Patient exercises on a regular basis and this kept her weight and excellent range   Review of Systems  Constitutional: Negative for activity change, appetite change and fatigue.  HENT: Negative for ear pain, congestion, neck pain, postnasal drip and sinus pressure.   Eyes: Positive for visual disturbance. Negative for redness.  Respiratory: Negative for cough, shortness of breath and wheezing.   Gastrointestinal: Negative for abdominal pain and abdominal distention.  Genitourinary: Negative for dysuria, frequency and menstrual problem.  Musculoskeletal: Negative for myalgias, joint swelling and arthralgias.  Skin: Negative for rash and wound.  Neurological: Negative for dizziness, weakness and headaches.  Hematological: Negative for adenopathy. Does not bruise/bleed easily.  Psychiatric/Behavioral: Negative for sleep disturbance and decreased concentration.   Past Medical History  Diagnosis Date  . Headache   . Hypertension   . Osteoporosis   . Constipation   . Low back pain   . Lumbar spondylolysis   . Arthritis   . Headache   . Depression   . Diverticulosis    Past Surgical History  Procedure Date  . Tonsillectomly   . Abdominal hysterectomy   . Perforated deverticular abscess/laporotomy and colostomy   . Take down of colostomy   . Back fu sion   . Joint replacement     reports that she has never smoked. She does not have any smokeless tobacco history on file. She reports that she drinks alcohol. She reports that she does not use illicit drugs. family history includes Alzheimer's disease in an unspecified family member; Cancer in her mother; and Heart disease in her father. No Known  Allergies     Objective:   Physical Exam  Constitutional: She is oriented to person, place, and time. She appears well-developed and well-nourished. No distress.  HENT:  Head: Normocephalic and atraumatic.  Right Ear: External ear normal.  Left Ear: External ear normal.  Nose: Nose normal.  Mouth/Throat: Oropharynx is clear and moist.  Eyes: Conjunctivae are normal.        esotropia  Neck: Normal range of motion. Neck supple. No JVD present. No tracheal deviation present. No thyromegaly present.  Cardiovascular: Normal rate, regular rhythm, normal heart sounds and intact distal pulses.   No murmur heard. Pulmonary/Chest: Effort normal and breath sounds normal. She has no wheezes. She exhibits no tenderness.  Abdominal: Soft. Bowel sounds are normal.  Musculoskeletal: Normal range of motion. She exhibits no edema and no tenderness.  Lymphadenopathy:    She has no cervical adenopathy.  Neurological: She is alert and oriented to person, place, and time. She has normal reflexes. No cranial nerve deficit.  Skin: Skin is warm and dry. She is not diaphoretic.  Psychiatric: She has a normal mood and affect. Her behavior is normal.          Assessment & Plan:  Patient's blood pressure is in excellent control.  She has kept her weight in check and this is seen both in her blood pressure as well as her cholesterol which is excellent these are the best cholesterol readings we have obtained in her monitoring history and she is at goal on all parts of her cholesterol.  Renal function is excellent and  sleep is better we asked her to continue with her exercise protocol is working well Renal function is excellent

## 2011-03-12 ENCOUNTER — Ambulatory Visit
Admission: RE | Admit: 2011-03-12 | Discharge: 2011-03-12 | Disposition: A | Discharge status: Home / Self Care | Payer: Medicare Other | Source: Ambulatory Visit | Attending: Surgery | Admitting: Surgery

## 2011-03-12 DIAGNOSIS — K469 Unspecified abdominal hernia without obstruction or gangrene: Secondary | ICD-10-CM

## 2011-03-12 MED ORDER — IOHEXOL 300 MG/ML  SOLN
100.0000 mL | Freq: Once | INTRAMUSCULAR | Status: AC | PRN
  Administered 2011-03-12: 100 mL via INTRAVENOUS

## 2011-03-20 ENCOUNTER — Other Ambulatory Visit: Payer: Self-pay | Admitting: *Deleted

## 2011-03-20 MED ORDER — FLUTICASONE PROPIONATE 50 MCG/ACT NA SUSP: 2.0000 | Freq: Every day | NASAL | Status: DC

## 2011-03-27 ENCOUNTER — Telehealth (INDEPENDENT_AMBULATORY_CARE_PROVIDER_SITE_OTHER): Payer: Self-pay | Admitting: Surgery

## 2011-03-27 NOTE — Telephone Encounter (Signed)
I spoke with the patient today about her CT scan report. To me, it looks like she has a fairly diffuse lower midline hernia. I do not believe she would be a candidate for a laparoscopic hernia repair. I told her that I would have some of my associates review her scan to see what type of repair they might recommend. I told her my PL week or so before he could followup on that. She is not particularly anxious for surgery, and she is asymptomatic.

## 2011-03-29 ENCOUNTER — Telehealth: Payer: Self-pay | Admitting: Internal Medicine

## 2011-04-23 ENCOUNTER — Ambulatory Visit (INDEPENDENT_AMBULATORY_CARE_PROVIDER_SITE_OTHER): Payer: Medicare Other | Admitting: General Surgery

## 2011-04-23 ENCOUNTER — Encounter (INDEPENDENT_AMBULATORY_CARE_PROVIDER_SITE_OTHER): Payer: Self-pay | Admitting: General Surgery

## 2011-04-23 VITALS — BP 110/76 | HR 68 | Temp 98.8°F | Resp 18 | Ht 62.0 in | Wt 133.0 lb

## 2011-04-23 DIAGNOSIS — K432 Incisional hernia without obstruction or gangrene: Secondary | ICD-10-CM

## 2011-04-23 NOTE — Progress Notes (Signed)
Subjective:     Patient ID: Tanya Leon, female   DOB: 1940-12-11, 70 y.o.   MRN: [id number]  HPI  Patient returns to see me for another opinion about repair of her ventral hernia. I have reviewed Dr. Cathie Leon notes and I have discussed her surgical problem with him.. I have reviewed Dr. Darryll Leon notes. I've reviewed her recent CT scan.  She states she has had this hernia for 5 years ever since she recovered from her colostomy closure. She remains very active with you in the hand and aerobic exercise. She feels a bulge and she sometimes has to push it back in but she has not had any severe pain. She has certainly not had any episodes of incarceration.   The CT scan shows significant separation of the medial borders of the rectus muscles on both sides. There is no other pathology seen on the CT scan. She will need a component separation technique to repair this. Certainly will at least need to release the external bike on the right side and possibly on the left. The presence of a colostomy closure on the left side will complicate the repair somewhat, however.Her current her health is very good. She has had numerous orthopedic procedures on her lumbar spine, right hip, and both knees. 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 She has had no other abdominal surgery other than the two-stage colon resection for perforated diverticulitis.Review of Systems Past Medical History  Diagnosis Date  . Headache   . Hypertension   . Osteoporosis   . Constipation   . Low back pain   . Lumbar spondylolysis   . Arthritis   . Headache   . Depression   . Diverticulosis     Current outpatient prescriptions:aspirin-acetaminophen-caffeine (EXCEDRIN EXTRA STRENGTH) 250-250-65 MG per tablet, Take 1 tablet by mouth every 6 (six) hours as needed.  , Disp: , Rfl: ;  azelastine (OPTIVAR) 0.05 % ophthalmic solution, 1 drop 2 (two) times daily.  , Disp: , Rfl: ;  B Complex-C-Folic Acid (MULTIVITAMIN, STRESS  FORMULA) tablet, Take 1 tablet by mouth daily.  , Disp: , Rfl:  bisacodyl (BISACODYL) 5 MG EC tablet, Take 5 mg by mouth daily as needed.  , Disp: , Rfl: ;  calcium-vitamin D (OSCAL WITH D 500-200) 500-200 MG-UNIT per tablet, Take 1 tablet by mouth 3 (three) times daily.  , Disp: , Rfl: ;  Cholecalciferol (VITAMIN D) 1000 UNITS capsule, Take 1,000 Units by mouth daily.  , Disp: , Rfl: ;  DULoxetine (CYMBALTA) 60 MG capsule, Take 60 mg by mouth daily.  , Disp: , Rfl:  estradiol (VIVELLE-DOT) 0.05 MG/24HR, Place 1 patch onto the skin. 2 times a week , Disp: , Rfl: ;  fluorometholone (FML) 0.1 % ophthalmic suspension, Place 1 drop into both eyes daily. , Disp: , Rfl: ;  fluticasone (FLONASE) 50 MCG/ACT nasal spray, Place 2 sprays into the nose daily., Disp: 16 g, Rfl: 11;  Multiple Vitamins-Minerals (PRESERVISION/LUTEIN) CAPS, Take by mouth daily.  , Disp: , Rfl:  Olopatadine HCl (PATADAY) 0.2 % SOLN, Apply to eye daily.  , Disp: , Rfl: ;  omega-3 acid ethyl esters (LOVAZA) 1 G capsule, Take 2 g by mouth daily.  , Disp: , Rfl: ;  polyethylene glycol (MIRALAX) powder, Take 17 g by mouth daily. , Disp: , Rfl: ;  pregabalin (LYRICA) 75 MG capsule, Take 1 capsule (75 mg total) by mouth 2 (two) times daily., Disp: 180 capsule, Rfl: 1;  Probiotic  Product (ALIGN) 4 MG CAPS, Take by mouth daily.  , Disp: , Rfl:  sennosides-docusate sodium (SENOKOT-S) 8.6-50 MG tablet, Take 1 tablet by mouth as needed.  , Disp: , Rfl: ;  tiZANidine (ZANAFLEX) 2 MG tablet, Take 2 mg by mouth every 6 (six) hours as needed.  , Disp: , Rfl: ;  zolpidem (AMBIEN) 5 MG tablet, Take 1 tablet (5 mg total) by mouth at bedtime as needed., Disp: 90 tablet, Rfl: 1  No Known Allergies  Tanya Leon does not currently have medications on file.  Family History  Problem Relation Age of Onset  . Alzheimer's disease    . Heart disease Father   . Cancer Mother    History  Substance Use Topics  . Smoking status: Never Smoker   . Smokeless tobacco:  Not on file  . Alcohol Use: Yes       Objective:   Physical Exam Patient looks well. The she is thin and appears fit. Her husband is with her.  Neck no adenopathy no mass  Lungs clear to auscultation her chest wall tenderness  Heart regular rate and rhythm no murmur radial femoral pulse are palpable  Soft and nontender. Examined supine and standing. Lower midline incision well healed. Left lower quadrant colostomy incision well healed. When she lies down I can feel fairly significant separation of the fascial defect, certainly greater than 6 cm. When standing she has what feels like  2 bulges 1 on the left side and one on the right side, but I do not feel a fascial bridge in between. I don't feel a hernia at the colostomy site.    Assessment:     Ventral incisional hernia, lower midline, with significant separation of the rectus muscles. This will require a component separation technique.   Plan:     E had a lengthy discussion about the indications and details of this surgery. I described the technique to the M. Tanya Leon pictures of Haldol muscles would be relaxed and brought together and held the mesh would be placed.They're aware that she may need for 7 days in the hospital to recover from this. She will need to 6 weeks away from the gym afterwards.  I discussed indication and details of surgery with her. Risks and complications have been outlined, including but not limited to bleeding, infection, injury to adjacent organs such as the intestine or bladder with major reconstructive surgery and compromise of the repair, recurrence of the hernia a 10-15% probability, cardiac pulmonary and thromboembolic problems. She seems to understand these issues well. At this time all her questions were answered. She is in l agreement with this plan.We will schedule her convenience.

## 2011-04-23 NOTE — Patient Instructions (Signed)
To repair of your ventral hernia, we will need to do an open repair with what is called a components separation technique. We have discussed the procedure and its risks in detail today. We will schedule this surgery at [facility] in the near future. I will have one of my partners help me with the surgery. You are  advised to take MiraLax 17 g twice a day for 3 days prior to the surgery. Please discontinue the use of aspirin and a omega-3 fatty acids for 5 days prior to the surgery.

## 2011-05-21 ENCOUNTER — Other Ambulatory Visit (INDEPENDENT_AMBULATORY_CARE_PROVIDER_SITE_OTHER): Payer: Self-pay | Admitting: General Surgery

## 2011-05-21 ENCOUNTER — Ambulatory Visit (HOSPITAL_COMMUNITY)
Admission: RE | Admit: 2011-05-21 | Discharge: 2011-05-21 | Disposition: A | Discharge status: Home / Self Care | Payer: Medicare Other | Source: Ambulatory Visit | Attending: General Surgery | Admitting: General Surgery

## 2011-05-21 ENCOUNTER — Encounter (HOSPITAL_COMMUNITY): Payer: Medicare Other

## 2011-05-21 DIAGNOSIS — Z01818 Encounter for other preprocedural examination: Secondary | ICD-10-CM

## 2011-05-21 DIAGNOSIS — Z01812 Encounter for preprocedural laboratory examination: Secondary | ICD-10-CM | POA: Insufficient documentation

## 2011-05-21 DIAGNOSIS — I517 Cardiomegaly: Secondary | ICD-10-CM | POA: Insufficient documentation

## 2011-05-21 DIAGNOSIS — Z01810 Encounter for preprocedural cardiovascular examination: Secondary | ICD-10-CM | POA: Insufficient documentation

## 2011-05-21 DIAGNOSIS — K469 Unspecified abdominal hernia without obstruction or gangrene: Secondary | ICD-10-CM | POA: Insufficient documentation

## 2011-05-21 LAB — COMPREHENSIVE METABOLIC PANEL
ALT: 13 U/L (ref 0–35)
Albumin: 4 g/dL (ref 3.5–5.2)
Alkaline Phosphatase: 67 U/L (ref 39–117)
Chloride: 101 mEq/L (ref 96–112)
Glucose, Bld: 69 mg/dL — ABNORMAL LOW (ref 70–99)
Potassium: 4.5 mEq/L (ref 3.5–5.1)
Sodium: 138 mEq/L (ref 135–145)
Total Bilirubin: 0.4 mg/dL (ref 0.3–1.2)
Total Protein: 6.9 g/dL (ref 6.0–8.3)

## 2011-05-21 LAB — DIFFERENTIAL
Basophils Absolute: 0 10*3/uL (ref 0.0–0.1)
Basophils Relative: 1 % (ref 0–1)
Monocytes Absolute: 0.4 10*3/uL (ref 0.1–1.0)
Neutro Abs: 3.1 10*3/uL (ref 1.7–7.7)
Neutrophils Relative %: 53 % (ref 43–77)

## 2011-05-21 LAB — URINALYSIS, ROUTINE W REFLEX MICROSCOPIC
Bilirubin Urine: NEGATIVE
Leukocytes, UA: NEGATIVE
Protein, ur: NEGATIVE mg/dL
Specific Gravity, Urine: 1.024 (ref 1.005–1.030)

## 2011-05-21 LAB — SURGICAL PCR SCREEN: MRSA, PCR: NEGATIVE

## 2011-05-21 LAB — CBC
MCHC: 33.2 g/dL (ref 30.0–36.0)
RDW: 13.9 % (ref 11.5–15.5)

## 2011-05-21 NOTE — Telephone Encounter (Signed)
In error

## 2011-05-28 ENCOUNTER — Inpatient Hospital Stay (HOSPITAL_COMMUNITY): Payer: Medicare Other

## 2011-05-28 ENCOUNTER — Inpatient Hospital Stay (HOSPITAL_COMMUNITY)
Admission: RE | Admit: 2011-05-28 | Discharge: 2011-05-31 | DRG: 355 | Disposition: A | Discharge status: Home / Self Care | Payer: Medicare Other | Source: Ambulatory Visit | Attending: General Surgery | Admitting: General Surgery

## 2011-05-28 DIAGNOSIS — T48205A Adverse effect of unspecified drugs acting on muscles, initial encounter: Secondary | ICD-10-CM | POA: Diagnosis not present

## 2011-05-28 DIAGNOSIS — T50995A Adverse effect of other drugs, medicaments and biological substances, initial encounter: Secondary | ICD-10-CM | POA: Diagnosis not present

## 2011-05-28 DIAGNOSIS — K432 Incisional hernia without obstruction or gangrene: Secondary | ICD-10-CM

## 2011-05-28 DIAGNOSIS — Z01812 Encounter for preprocedural laboratory examination: Secondary | ICD-10-CM

## 2011-05-28 HISTORY — PX: HERNIA REPAIR: SHX51

## 2011-05-28 HISTORY — PX: OTHER SURGICAL HISTORY: SHX169

## 2011-05-28 LAB — BASIC METABOLIC PANEL
BUN: 8 mg/dL (ref 6–23)
Calcium: 8.5 mg/dL (ref 8.4–10.5)
Creatinine, Ser: 0.64 mg/dL (ref 0.50–1.10)
GFR calc Af Amer: >60 mL/min (ref >60)
GFR calc non Af Amer: >60 mL/min (ref >60)

## 2011-05-28 LAB — CBC
HCT: 36.4 % (ref 36.0–46.0)
MCH: 31.5 pg (ref 26.0–34.0)
MCHC: 34.3 g/dL (ref 30.0–36.0)
MCV: 91.7 fL (ref 78.0–100.0)
Platelets: 145 10*3/uL — ABNORMAL LOW (ref 150–400)
RDW: 13.7 % (ref 11.5–15.5)

## 2011-05-28 LAB — CARDIAC PANEL(CRET KIN+CKTOT+MB+TROPI): Troponin I: <0.3 ng/mL (ref <0.30)

## 2011-05-31 ENCOUNTER — Telehealth (INDEPENDENT_AMBULATORY_CARE_PROVIDER_SITE_OTHER): Payer: Self-pay | Admitting: General Surgery

## 2011-06-02 NOTE — Op Note (Signed)
Tanya Leon, Tanya Leon                ACCOUNT NO.:  [id number]  MEDICAL RECORD NO.:  [id number]  LOCATION:  1234                         FACILITY:  [facility]  PHYSICIAN:  Angelia Mould. Derrell Lolling, M.D.DATE OF BIRTH:  24-Feb-1941  DATE OF PROCEDURE:  05/28/2011 DATE OF DISCHARGE:                              OPERATIVE REPORT   PREOPERATIVE DIAGNOSIS:  Ventral incisional hernia.  POSTOPERATIVE DIAGNOSIS:  Ventral incisional hernia.  OPERATION PERFORMED:  Open repair of ventral incisional hernia with mesh.  SURGEON:  Angelia Mould. Derrell Lolling, M.D.  FIRST ASSISTANT:  Maisie Fus A. Cornett, M.D.  OPERATIVE INDICATIONS:  This is a healthy 70 year old Caucasian female who has had a history of emergency surgery for perforated diverticulitis in 2007.  That required a colostomy and subsequently she had a laparoscopic takedown of her colostomy.  She has done well other than having developed a hernia in her lower midline.  On physical exam, this is a large bulge between the umbilicus and the suprapubic area.  There is a bulge about the size of a grapefruit, and the separation of the fascial edge is greater than 6 cm probably larger.  CT scan shows significant separation of the medial borders of the rectus muscles bilaterally.  There is no hernia in the region of the colostomy site in the left lower quadrant.  She desires to have this repaired before it gets larger and before she gets older, and she was brought to the operating room electively.  OPERATIVE FINDINGS:  With some undermining of the subcutaneous fat, I was able to approximate the fascia without performing a components separation technique.  I was able to do an inlay mesh reinforcement with Physiomesh.  The size of the mesh once it was trimmed down, was probably about 23 cm vertically by about 16 cm transversely.  I was able to take the mesh repair all the way down to the symphysis pubis, all the way under and beyond the colostomy site of the left  side, and up the midline approximately 5 cm above the umbilicus.  There were some adhesions to the intraabdominal wall of omentum but really not much.  The small bowel and large bowel felt normal to palpation.  OPERATIVE TECHNIQUE:  Following the induction of general endotracheal anesthesia, a Foley catheter was placed.  Intravenous antibiotics were given.  The abdomen was prepped and draped in a sterile fashion.  The patient was identified as the correct patient and correct procedure. The midline incision was made excising the old scar.  Ultimately, I had to go around the umbilicus and above it about 4 or 5 cm, because the fascia was very thinned out in that area.  I dissected all the way down to the soft tissue and found that we could enter the peritoneal cavity, and once I was able to look inside and get a finger in it, I found that there was very little adhesions.  We opened up the incision from top to bottom.  We debrided some adherent omentum.  We found that the fascia was quite strong from the symphysis pubis up around about 4 cm to 5 cm above the symphysis pubis, so we did not have to divide  the fascia all the way down to the symphysis pubis.  We found that the fascia was very thinned out under and slightly above the umbilicus, so we carried the incision above that until we got a strong fascia above.  We undermined the subcutaneous tissue bilaterally until we were back about 5 or 6 cm in all directions, and on the left side, we were beyond the colostomy site.  The fascia there appeared intact.  We chose to repair the hernia with an inlay technique using Physiomesh. We brought a 25 x 20 cm piece of Physiomesh to the operative field and trimmed it down slightly as we implanted it.  We tried to get 5 or 6 cm underlay in all directions and we were able to achieve that.  We started at the symphysis pubis and very carefully started the suture line there, to avoid injury to the  bladder.  We used interrupted mattress sutures of #1 Novafil and these were placed sequentially, until we had circumferentially implanted and fixed the mesh under the fascia.  Dr. Luisa Hart and I  felt all the way around and there were no defects or holes in the repair.  We irrigated the subcutaneous tissue.  We closed the fascia in the midline  with interrupted sutures of #1 Novafil.  We took 20 cc of Exparel and mixed it with 40 cc of saline.  We injected all 60 cc into the muscle fascia and the subcutaneous tissue.  We placed two 19-French Blake drains in the wound and brought them out through separate stab incisions in the subcostal area of the right and left.  The drains were sutured in place with nylon sutures and connected to suction bulbs.  Subcutaneous tissue was closed with interrupted sutures of 3-0 Vicryl and the skin closed with skin staples.  Clean bandages and a Velcro binder were placed.  The patient tolerated the procedure well and was taken to recovery room in stable condition.  Estimated blood loss was about 100 cc or less.  Complications none.  Sponge, needles, and instrument counts were correct.     Angelia Mould. Derrell Lolling, M.D.     HMI/MEDQ  D:  05/28/2011  T:  05/28/2011  Job:  [id number]  cc:   Stacie Glaze, MD [address] [place] [address]  Electronically Signed by Claud Kelp M.D. on 06/02/2011 11:40:34 AM

## 2011-06-06 ENCOUNTER — Ambulatory Visit (INDEPENDENT_AMBULATORY_CARE_PROVIDER_SITE_OTHER): Payer: Medicare Other | Admitting: General Surgery

## 2011-06-06 ENCOUNTER — Encounter (INDEPENDENT_AMBULATORY_CARE_PROVIDER_SITE_OTHER): Payer: Self-pay | Admitting: General Surgery

## 2011-06-06 VITALS — BP 108/78 | HR 70

## 2011-06-06 DIAGNOSIS — Z9889 Other specified postprocedural states: Secondary | ICD-10-CM

## 2011-06-06 NOTE — Progress Notes (Signed)
Subjective:     Patient ID: Tanya Leon, female   DOB: 12/28/1940, 70 y.o.   MRN: [id number]  HPI This patient underwent open repair of a ventral hernia with inlay mesh approximately 9 days ago. She was discharged on postop day #3. She has been doing very well. Not much pain just a little bit of discomfort on the left side. Eating well. Normal bowel function. Voiding uneventfully. No fever or chills.  Drain #2 has stopped draining and that is on the left side. Drain #1 is still draining out of 10 cc per day  Review of Systems     Objective:   Physical Exam Patient looks well. Her husband is with her. Abdomen is soft flat minimally tender. The midline incision is healing uneventfully. Staples were removed and Steri-Strips are applied. The left-sided drain was removed. I left the right-sided drain in place. This was redressed.    Assessment:     Status post open repair of ventral incisional hernia with inlay mesh. We did not have to do a component separation technique as we were able to approximate the fascia in the midline with Physiomesh underneath.  No apparent immediate postop complications.    Plan:     Continue drain and wound care as outlined.  No sports or heavy lifting for 5 weeks.  Take a walk every day.  Return to see me in one week, hopefully we can remove the remaining drain at that time.

## 2011-06-06 NOTE — Patient Instructions (Signed)
Everything basically looks good today. Continue to keep a record of the drainage from the remaining drain. Take a walk every day. No heavy lifting. I will see you in one week and we will consider removing the other drain at that time.

## 2011-06-12 ENCOUNTER — Ambulatory Visit (INDEPENDENT_AMBULATORY_CARE_PROVIDER_SITE_OTHER): Payer: Medicare Other | Admitting: General Surgery

## 2011-06-12 ENCOUNTER — Encounter (INDEPENDENT_AMBULATORY_CARE_PROVIDER_SITE_OTHER): Payer: Self-pay | Admitting: General Surgery

## 2011-06-12 VITALS — BP 120/78 | HR 72 | Temp 97.6°F | Ht 62.5 in | Wt 134.6 lb

## 2011-06-12 DIAGNOSIS — Z9889 Other specified postprocedural states: Secondary | ICD-10-CM

## 2011-06-12 NOTE — Patient Instructions (Signed)
Everything appears to be healing normally. We removed the last drain today. You may start taking showers tomorrow. No sports or heavy lifting for 5-6 weeks from the date of surgery. Return to see me in the office in one month.

## 2011-06-12 NOTE — Progress Notes (Signed)
Subjective:     Patient ID: Tanya Leon, female   DOB: [birth year], 70 y.o.   MRN: [id number]  HPI The patient is doing well following her ventral hernia repair. Minimal pain. Normal appetite. Normal gastrointestinal function. No fevers. The drainage is down to less than 10 cc per day.  Review of Systems     Objective:   Physical Exam The patient looks very good. Her husband is with her. Abdomen is soft and flat. All the wounds are healing normally without any signs of any infection. I removed the right-sided Jackson-Pratt drain and redressed the wound.    Assessment:     Status post recent open repair of ventral hernia with inlay mesh. The  Patient is recovering without apparent complications.    Plan:     Okay to shower starting tomorrow.  No sports or heavy lifting for 5-6 weeks from the date of surgery.  Return to see me in one month.

## 2011-06-12 NOTE — Discharge Summary (Signed)
NAMESALOMA, CADENA                ACCOUNT NO.:  [id number]  MEDICAL RECORD NO.:  [id number]  LOCATION:  1234                         FACILITY:  [facility]  PHYSICIAN:  Angelia Mould. Derrell Lolling, M.D.DATE OF BIRTH:  January 15, 1941  DATE OF ADMISSION:  05/28/2011 DATE OF DISCHARGE:  05/31/2011                              DISCHARGE SUMMARY   FINAL DIAGNOSES: 1. Ventral incisional hernia. 2. History of perforated diverticulitis.  OPERATIONS PERFORMED:  Open repair of ventral incisional hernia with inlay mesh, date May 27, 2001.  HISTORY:  This is a healthy 70 year old Caucasian female with a history of emergency surgery for perforated diverticulitis in 2007.  This required a colostomy and subsequently, she had a laparoscopic takedown of her colostomy.  She did well from this other than having developed hernia in the lower midline.  On physical exam, she has a large bulge between the umbilicus and the suprapubic area, which is about the size of a grapefruit.  The separation of the fascia is greater than 6 cm, possibly more.  CT scan showed significant separation of the medial border of the rectus muscle.  The colostomy site looked fine without any hernia.  She was evaluated as an outpatient.  It was felt that this would require an open repair, possibly a component separation technique. She wanted to go ahead and have this done now because of concerns that it would be more difficult when she was older and possibly larger.  HOSPITAL COURSE:  On the day of admission, the patient was taken to the operating room and underwent open repair of her ventral incisional hernia.  We had to make incision from about 4- cm above the symphysis pubis to about 4-cm above the umbilicus to incorporate all of the fascial defect.  We did not have to do a component separation technique. We were able to inlay mesh and bring the fascia together in the midline over the top of the mesh without much tension.   The surgery  was relatively uneventful.  The patient required hospitalization for about 3 postop days.  She progressed in her diet and activities fairly well and was ready for discharge on August 31.  At that time, she was breathing well, ambulating well, tolerating a diet and had a small bowel movement.  Her abdomen was soft.  The wound looked clean.  She was discharged with the drains in place with instructions to return to see me in the office in 1 week in hopes of getting the drains removed at that time.  DISCHARGE MEDICATIONS:  Include her usual medications, which include 1. Refresh Plus artificial tears. 2. Vivelle-Dot transderm. 3. Viactiv 500 mg daily. 4. Pataday 0.2% both eyes daily. 5. Lutein 20 mg daily. 6. Excedrin tension headache p.r.n. 7. MiraLAX daily. 8. Bisacodyl 5 mg two tablets daily. 9. Align 4 mg daily. 10.Omega-3 1 g daily. 11.Multivitamins daily. 12.Vitamin D one cap daily. 13.Vitamin C 500 mg daily. 14.Fluocinonide topical 0.05% to scalp. 15.Fluticasone nasal spray as needed. 16.Fluorometholone ophthalmic 0.1% both eyes daily. 17.Zanaflex 2 mg daily as needed. 18.Ambien as needed. 19.Cymbalta 60 mg daily. 20.Lyrica 75 mg twice daily. 21.She is given a prescription for Vicodin for pain.  We discussed her diet and activities.  I will see her back in the office in 1 week.     Angelia Mould. Derrell Lolling, M.D.     HMI/MEDQ  D:  06/12/2011  T:  06/12/2011  Job:  [id number]  cc:   Stacie Glaze, MD [address] [place] [address]  Electronically Signed by Claud Kelp M.D. on 06/12/2011 04:57:56 PM

## 2011-06-26 LAB — BASIC METABOLIC PANEL
CO2: 30
Chloride: 105
GFR calc non Af Amer: >60
Glucose, Bld: 69 — ABNORMAL LOW
Potassium: 4.8
Sodium: 139

## 2011-06-26 LAB — DIFFERENTIAL
Basophils Absolute: 0
Eosinophils Absolute: 0.1
Eosinophils Relative: 1
Lymphocytes Relative: 28
Monocytes Absolute: 0.5

## 2011-06-26 LAB — CBC
HCT: 38.8
Hemoglobin: 13.2
MCV: 91.9
RDW: 13.1

## 2011-07-11 ENCOUNTER — Ambulatory Visit (INDEPENDENT_AMBULATORY_CARE_PROVIDER_SITE_OTHER): Payer: Medicare Other | Admitting: General Surgery

## 2011-07-11 ENCOUNTER — Encounter (INDEPENDENT_AMBULATORY_CARE_PROVIDER_SITE_OTHER): Payer: Self-pay | Admitting: General Surgery

## 2011-07-11 VITALS — BP 118/86 | HR 64 | Temp 96.9°F | Resp 20 | Ht 62.5 in | Wt 133.0 lb

## 2011-07-11 DIAGNOSIS — Z9889 Other specified postprocedural states: Secondary | ICD-10-CM

## 2011-07-11 NOTE — Progress Notes (Signed)
Subjective:     Patient ID: Tanya Leon, female   DOB: 06/08/1941, 70 y.o.   MRN: [id number]  HPI Ms. Petion is doing well. She has no pain or wound problems. She was to resume exercise activities.  Review of Systems     Objective:   Physical Exam Ms. Angelo is here with her husband. She is in good spirits and is in no distress.  Abdomen examined standing and supine. Incision is well-healed in the lower midline. The hernia repair appeared solid and secure. The colostomy closure site in the left  quadrant also the appears solid. No sign of any fluid collections.    Assessment:     Status post open repair ventral incisional hernia with mesh. Uneventful recovery without complications.    Plan:     Okay to resume all normal physical activities without restriction. Stretching exercises were advised.  Return to  see me p.r.n.

## 2011-07-11 NOTE — Patient Instructions (Signed)
You have recovered uneventfully from your hernia surgery. You may resume physical activities including yoga, step aerobics, , and line dancing. Return to see me if there are further problems.

## 2011-08-13 ENCOUNTER — Ambulatory Visit (INDEPENDENT_AMBULATORY_CARE_PROVIDER_SITE_OTHER): Payer: Medicare Other | Admitting: Internal Medicine

## 2011-08-13 ENCOUNTER — Encounter: Payer: Self-pay | Admitting: Internal Medicine

## 2011-08-13 DIAGNOSIS — Z0000 Encounter for general adult medical examination without abnormal findings: Secondary | ICD-10-CM

## 2011-08-13 DIAGNOSIS — E785 Hyperlipidemia, unspecified: Secondary | ICD-10-CM

## 2011-08-13 DIAGNOSIS — T887XXA Unspecified adverse effect of drug or medicament, initial encounter: Secondary | ICD-10-CM

## 2011-08-13 DIAGNOSIS — R928 Other abnormal and inconclusive findings on diagnostic imaging of breast: Secondary | ICD-10-CM

## 2011-08-13 DIAGNOSIS — G4700 Insomnia, unspecified: Secondary | ICD-10-CM

## 2011-08-13 DIAGNOSIS — M810 Age-related osteoporosis without current pathological fracture: Secondary | ICD-10-CM

## 2011-08-13 LAB — CBC WITH DIFFERENTIAL/PLATELET
Basophils Relative: 0.6 % (ref 0.0–3.0)
Eosinophils Absolute: 0.6 10*3/uL (ref 0.0–0.7)
Eosinophils Relative: 11.6 % — ABNORMAL HIGH (ref 0.0–5.0)
HCT: 40.7 % (ref 36.0–46.0)
Lymphs Abs: 1.8 10*3/uL (ref 0.7–4.0)
MCHC: 33.7 g/dL (ref 30.0–36.0)
MCV: 91.6 fl (ref 78.0–100.0)
Monocytes Absolute: 0.3 10*3/uL (ref 0.1–1.0)
Neutrophils Relative %: 45 % (ref 43.0–77.0)
RBC: 4.45 Mil/uL (ref 3.87–5.11)
WBC: 4.9 10*3/uL (ref 4.5–10.5)

## 2011-08-13 LAB — POCT URINALYSIS DIPSTICK
Bilirubin, UA: NEGATIVE
Blood, UA: NEGATIVE
Ketones, UA: NEGATIVE
Protein, UA: NEGATIVE
Spec Grav, UA: 1.025
pH, UA: 7

## 2011-08-13 NOTE — Progress Notes (Signed)
Subjective:    Patient ID: Tanya Leon, female    DOB: 1941/02/14, 70 y.o.   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  Blood pressure appears well-controlled today monitoring blood work will be ordered for her renal stone liver lipid and CBC differential for her chronic problems as well as completion of the Medicare wellness questionaire Review of Systems  Constitutional: Negative for activity change, appetite change and fatigue.  HENT: Negative for ear pain, congestion, neck pain, postnasal drip and sinus pressure.   Eyes: Negative for redness and visual disturbance.  Respiratory: Negative for cough, shortness of breath and wheezing.   Gastrointestinal: Negative for abdominal pain and abdominal distention.  Genitourinary: Negative for dysuria, frequency and menstrual problem.  Musculoskeletal: Negative for myalgias, joint swelling and arthralgias.  Skin: Negative for rash and wound.  Neurological: Negative for dizziness, weakness and headaches.  Hematological: Negative for adenopathy. Does not bruise/bleed easily.  Psychiatric/Behavioral: Negative for sleep disturbance and decreased concentration.   Past Medical History  Diagnosis Date  . Osteoporosis   . Constipation   . Low back pain   . Lumbar spondylolysis   . Arthritis   . Depression   . Diverticulosis    Past Surgical History  Procedure Date  . Tonsillectomly   . Abdominal hysterectomy   . Perforated deverticular abscess/laporotomy and colostomy   . Take down of colostomy   . Back fu sion   . Joint replacement   . Hernia repair 05/28/11    reports that she has never smoked. She does not have any smokeless tobacco history on file. She reports that she drinks alcohol. She reports that she does not use illicit  drugs. family history includes Alzheimer's disease in an unspecified family member; Cancer in her mother; and Heart disease in her father. No Known Allergies     Objective:   Physical Exam  Nursing note and vitals reviewed. Constitutional: She is oriented to person, place, and time. She appears well-developed and well-nourished. No distress.  HENT:  Head: Normocephalic and atraumatic.  Right Ear: External ear normal.  Left Ear: External ear normal.  Nose: Nose normal.  Mouth/Throat: Oropharynx is clear and moist.  Eyes: Conjunctivae and EOM are normal. Pupils are equal, round, and reactive to light.  Neck: Normal range of motion. Neck supple. No JVD present. No tracheal deviation present. No thyromegaly present.  Cardiovascular: Normal rate, regular rhythm, normal heart sounds and intact distal pulses.   No murmur heard. Pulmonary/Chest: Effort normal and breath sounds normal. She has no wheezes. She exhibits no tenderness.  Abdominal: Soft. Bowel sounds are normal.  Musculoskeletal: Normal range of motion. She exhibits no edema and no tenderness.  Lymphadenopathy:    She has no cervical adenopathy.  Neurological: She is alert and oriented to person, place, and time. She has normal reflexes. No cranial nerve deficit.  Skin: Skin is warm and dry. She is not diaphoretic.  Psychiatric: She has a normal mood and affect. Her behavior is normal.          Assessment & Plan:  Blood pressure stable Patient's chronic back pain is treated with Lyrica and Cymbalta Patient takes a line for irritable bowel And uses Zanaflex when necessary for muscle spasms and Ambien one half tablet for insomnia Subjective:    Tanya Dell  Leon is a 70 y.o. female who presents for Medicare Annual/Subsequent preventive examination.  Preventive Screening-Counseling & Management  Tobacco History  Smoking status  . Never Smoker   Smokeless tobacco  . Not on file     Problems Prior to Visit 1.    Current Problems (verified) Patient Active Problem List  Diagnoses  . COLONIC POLYPS  . HYPERCHOLESTEROLEMIA, MILD  . INSOMNIA, CHRONIC  . ESOTROPIA, RIGHT EYE  . HYPERTENSION  . CEREBRAL ANEURYSM  . ALLERGIC RHINITIS  . DIVERTICULOSIS, COLON  . CONSTIPATION, DRUG INDUCED  . 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 HIP PAIN, RIGHT, CHRONIC  . SPINAL STENOSIS, LUMBAR  . LOW BACK PAIN  . OSTEOPOROSIS  . SPONDYLOLISTHESIS  . HEADACHE    Medications Prior to Visit Current Outpatient Prescriptions on File Prior to Visit  Medication Sig Dispense Refill  . aspirin-acetaminophen-caffeine (EXCEDRIN EXTRA STRENGTH) 250-250-65 MG per tablet Take 1 tablet by mouth every 6 (six) hours as needed.        Marland Kitchen azelastine (OPTIVAR) 0.05 % ophthalmic solution 1 drop 2 (two) times daily.        . B Complex-C-Folic Acid (MULTIVITAMIN, STRESS FORMULA) tablet Take 1 tablet by mouth daily.        . bisacodyl (BISACODYL) 5 MG EC tablet Take 5 mg by mouth daily as needed.        . calcium-vitamin D (OSCAL WITH D 500-200) 500-200 MG-UNIT per tablet Take 1 tablet by mouth 3 (three) times daily.        . Cholecalciferol (VITAMIN D) 1000 UNITS capsule Take 1,000 Units by mouth daily.        . Ciclopirox 1 % shampoo       . DULoxetine (CYMBALTA) 60 MG capsule Take 60 mg by mouth daily.        Marland Kitchen estradiol (VIVELLE-DOT) 0.05 MG/24HR Place 1 patch onto the skin. 2 times a week       . fluorometholone (FML) 0.1 % ophthalmic suspension Place 1 drop into both eyes daily.       . fluticasone (FLONASE) 50 MCG/ACT nasal spray Place 2 sprays into the nose daily.  16 g  11  . ketoconazole (NIZORAL) 2 % shampoo       . Multiple Vitamins-Minerals (PRESERVISION/LUTEIN) CAPS Take by mouth daily.        . Olopatadine HCl (PATADAY) 0.2 % SOLN Apply to eye daily.        Marland Kitchen omega-3 acid ethyl esters (LOVAZA) 1 G capsule Take 2 g by mouth daily.        . polyethylene glycol (MIRALAX) powder Take 17 g by mouth daily.        . pregabalin (LYRICA) 75 MG capsule Take 1 capsule (75 mg total) by mouth 2 (two) times daily.  180 capsule  1  . Probiotic Product (ALIGN) 4 MG CAPS Take by mouth daily.        . RESTASIS 0.05 % ophthalmic emulsion       . sennosides-docusate sodium (SENOKOT-S) 8.6-50 MG tablet Take 1 tablet by mouth as needed.        . Sulfacetamide Sodium, Acne, 10 % LOTN       . tiZANidine (ZANAFLEX) 2 MG tablet Take 2 mg by mouth every 6 (six) hours as needed.        . zolpidem (AMBIEN) 5 MG tablet Take 1 tablet (5 mg total) by mouth at bedtime as needed.  90 tablet  1  Current Medications (verified) Current Outpatient Prescriptions  Medication Sig Dispense Refill  . aspirin-acetaminophen-caffeine (EXCEDRIN EXTRA STRENGTH) 250-250-65 MG per tablet Take 1 tablet by mouth every 6 (six) hours as needed.        Marland Kitchen azelastine (OPTIVAR) 0.05 % ophthalmic solution 1 drop 2 (two) times daily.        . B Complex-C-Folic Acid (MULTIVITAMIN, STRESS FORMULA) tablet Take 1 tablet by mouth daily.        . bisacodyl (BISACODYL) 5 MG EC tablet Take 5 mg by mouth daily as needed.        . calcium-vitamin D (OSCAL WITH D 500-200) 500-200 MG-UNIT per tablet Take 1 tablet by mouth 3 (three) times daily.        . Cholecalciferol (VITAMIN D) 1000 UNITS capsule Take 1,000 Units by mouth daily.        . Ciclopirox 1 % shampoo       . DULoxetine (CYMBALTA) 60 MG capsule Take 60 mg by mouth daily.        Marland Kitchen estradiol (VIVELLE-DOT) 0.05 MG/24HR Place 1 patch onto the skin. 2 times a week       . fluorometholone (FML) 0.1 % ophthalmic suspension Place 1 drop into both eyes daily.       . fluticasone (FLONASE) 50 MCG/ACT nasal spray Place 2 sprays into the nose daily.  16 g  11  . ketoconazole (NIZORAL) 2 % shampoo       . Multiple Vitamins-Minerals (PRESERVISION/LUTEIN) CAPS Take by mouth daily.        . Olopatadine HCl (PATADAY) 0.2 % SOLN Apply to eye daily.        Marland Kitchen omega-3 acid ethyl esters (LOVAZA) 1 G capsule Take 2 g by  mouth daily.        . polyethylene glycol (MIRALAX) powder Take 17 g by mouth daily.       . pregabalin (LYRICA) 75 MG capsule Take 1 capsule (75 mg total) by mouth 2 (two) times daily.  180 capsule  1  . Probiotic Product (ALIGN) 4 MG CAPS Take by mouth daily.        . RESTASIS 0.05 % ophthalmic emulsion       . sennosides-docusate sodium (SENOKOT-S) 8.6-50 MG tablet Take 1 tablet by mouth as needed.        . Sulfacetamide Sodium, Acne, 10 % LOTN       . tiZANidine (ZANAFLEX) 2 MG tablet Take 2 mg by mouth every 6 (six) hours as needed.        . zolpidem (AMBIEN) 5 MG tablet Take 1 tablet (5 mg total) by mouth at bedtime as needed.  90 tablet  1     Allergies (verified) Review of patient's allergies indicates no known allergies.   PAST HISTORY  Family History Family History  Problem Relation Age of Onset  . Alzheimer's disease    . Heart disease Father   . Cancer Mother     Social History History  Substance Use Topics  . Smoking status: Never Smoker   . Smokeless tobacco: Not on file  . Alcohol Use: Yes     Are there smokers in your home (other than you)? No  Risk Factors Current exercise habits: Gym/ health club routine includes cardio, treadmill and yoga.  Dietary issues discussed: no    Cardiac risk factors: advanced age (older than 35 for men, 59 for women).  Depression Screen (Note: if answer to either of the following is "Yes", a  more complete depression screening is indicated)   Over the past two weeks, have you felt down, depressed or hopeless? No  Over the past two weeks, have you felt little interest or pleasure in doing things? No  Have you lost interest or pleasure in daily life? No  Do you often feel hopeless? No  Do you cry easily over simple problems? No  Activities of Daily Living In your present state of health, do you have any difficulty performing the following activities?:  Driving? No Managing money?  No Feeding yourself? No Getting from bed  to chair? No Climbing a flight of stairs? No Preparing food and eating?: No Bathing or showering? No Getting dressed: No Getting to the toilet? No Using the toilet:No Moving around from place to place: No In the past year have you fallen or had a near fall?:yes, two falls These were in the school of [employer] due to visual problems   Are you sexually active?  No  Do you have more than one partner?  No  Hearing Difficulties: No Do you often ask people to speak up or repeat themselves? No Do you experience ringing or noises in your ears? No Do you have difficulty understanding soft or whispered voices? No   Do you feel that you have a problem with memory? No  Do you often misplace items? No  Do you feel safe at home?  yes  Cognitive Testing  Alert? Yes  Normal Appearance?Yes  Oriented to person? Yes  Place? Yes   Time? Yes  Recall of three objects?  Yes  Can perform simple calculations? No  Displays appropriate judgment?Yes  Can read the correct time from a watch face?Yes   Advanced Directives have been discussed with the patient? Yes  List the Names of Other Physician/Practitioners you currently use: 1.    Indicate any recent Medical Services you may have received from other than [facility] providers in the past year (date may be approximate).  Immunization History  Administered Date(s) Administered  . H1N1 09/13/2008  . Influenza Whole 06/24/2008, 06/26/2009, 08/01/2010  . Pneumococcal Polysaccharide 12/30/2005  . Td 08/01/2010  . Zoster 10/08/2007    Screening Tests Health Maintenance  Topic Date Due  . Influenza Vaccine  07/01/2011  . Colonoscopy  01/27/2019  . Tetanus/tdap  08/01/2020  . Pneumococcal Polysaccharide Vaccine Age 69 And Over  Completed  . Zostavax  Completed    All answers were reviewed with the patient and necessary referrals were made:  Carrie Mew   08/13/2011   History reviewed: allergies, current medications, past family history, past  medical history, past social history, past surgical history and problem list  Review of Systems A comprehensive review of systems was negative.    Objective:     Vision by Snellen chart: right eye:un able to assess:seeing opthamology for esotropia and tracking issues and focus problems Body mass index is 24.14 kg/(m^2). BP 130/78  Pulse 68  Temp 98.2 F (36.8 C)  Resp 16  Ht 5\' 2"  (1.575 m)  Wt 132 lb (59.875 kg)  BMI 24.14 kg/m2  BP 130/78  Pulse 68  Temp 98.2 F (36.8 C)  Resp 16  Ht 5\' 2"  (1.575 m)  Wt 132 lb (59.875 kg)  BMI 24.14 kg/m2  General Appearance:    Alert, cooperative, no distress, appears stated age  Head:    Normocephalic, without obvious abnormality, atraumatic  Eyes:    PERRL, conjunctiva/corneas clear, EOM's intact, fundi    benign, both eyes  Ears:    Normal TM's and external ear canals, both ears  Nose:   Nares normal, septum midline, mucosa normal, no drainage    or sinus tenderness  Throat:   Lips, mucosa, and tongue normal; teeth and gums normal  Neck:   Supple, symmetrical, trachea midline, no adenopathy;    thyroid:  no enlargement/tenderness/nodules; no carotid   bruit or JVD  Back:     Symmetric, no curvature, ROM normal, no CVA tenderness  Lungs:     Clear to auscultation bilaterally, respirations unlabored  Chest Wall:    No tenderness or deformity   Heart:    Regular rate and rhythm, S1 and S2 normal, no murmur, rub   or gallop  Breast Exam:    No tenderness, masses, or nipple abnormality  Abdomen:     Soft, non-tender, bowel sounds active all four quadrants,    no masses, no organomegaly  Genitalia:    Normal female without lesion, discharge or tenderness  Rectal:    Normal tone, normal prostate, no masses or tenderness;   guaiac negative stool  Extremities:   Extremities normal, atraumatic, no cyanosis or edema  Pulses:   2+ and symmetric all extremities  Skin:   Skin color, texture, turgor normal, no rashes or lesions  Lymph nodes:    Cervical, supraclavicular, and axillary nodes normal  Neurologic:   CNII-XII intact, normal strength, sensation and reflexes    throughout       Assessment:      This is a routine physical examination for this healthy  Female. Reviewed all health maintenance protocols including mammography colonoscopy bone density and reviewed appropriate screening labs. Her immunization history was reviewed as well as her current medications and allergies refills of her chronic medications were given and the plan for yearly health maintenance was discussed all orders and referrals were made as appropriate.      Plan:     During the course of the visit the patient was educated and counseled about appropriate screening and preventive services including:    Influenza vaccine  Screening mammography  Bone densitometry screening  Diet review for nutrition referral? Yes ____  Not Indicated ___x_   Patient Instructions (the written plan) was given to the patient.  Medicare Attestation I have personally reviewed: The patient's medical and social history Their use of alcohol, tobacco or illicit drugs Their current medications and supplements The patient's functional ability including ADLs,fall risks, home safety risks, cognitive, and hearing and visual impairment Diet and physical activities Evidence for depression or mood disorders  The patient's weight, height, BMI, and visual acuity have been recorded in the chart.  I have made referrals, counseling, and provided education to the patient based on review of the above and I have provided the patient with a written personalized care plan for preventive services.     Carrie Mew   08/13/2011

## 2011-08-14 LAB — HEPATIC FUNCTION PANEL
AST: 20 U/L (ref 0–37)
Albumin: 4.2 g/dL (ref 3.5–5.2)
Total Protein: 6.8 g/dL (ref 6.0–8.3)

## 2011-08-14 LAB — LIPID PANEL
Cholesterol: 170 mg/dL (ref 0–200)
HDL: 83.1 mg/dL (ref >39.00)
VLDL: 9.2 mg/dL (ref 0.0–40.0)

## 2011-08-14 LAB — BASIC METABOLIC PANEL
BUN: 19 mg/dL (ref 6–23)
CO2: 31 mEq/L (ref 19–32)
Calcium: 9.2 mg/dL (ref 8.4–10.5)
GFR: 87.5 mL/min (ref >60.00)
Glucose, Bld: 65 mg/dL — ABNORMAL LOW (ref 70–99)

## 2011-08-15 ENCOUNTER — Encounter: Payer: Self-pay | Admitting: Internal Medicine

## 2011-08-23 ENCOUNTER — Encounter: Payer: Self-pay | Admitting: Internal Medicine

## 2011-09-09 ENCOUNTER — Other Ambulatory Visit: Payer: Self-pay | Admitting: *Deleted

## 2011-09-09 MED ORDER — PREGABALIN 75 MG PO CAPS: 75.0000 mg | ORAL_CAPSULE | Freq: Two times a day (BID) | ORAL | Status: DC

## 2011-09-09 MED ORDER — ZOLPIDEM TARTRATE 5 MG SL SUBL: 1.0000 | SUBLINGUAL_TABLET | Freq: Every day | SUBLINGUAL | Status: DC

## 2011-09-26 NOTE — Progress Notes (Signed)
  Subjective:    Patient ID: Tanya Leon, female    DOB: 02/28/1941, 70 y.o.   MRN: [id number]  HPI  Elevation of blood pressure due to pain  Review of Systems  Constitutional: Negative for activity change, appetite change and fatigue.  HENT: Negative for ear pain, congestion, neck pain, postnasal drip and sinus pressure.   Eyes: Negative for redness and visual disturbance.  Respiratory: Negative for cough, shortness of breath and wheezing.   Gastrointestinal: Negative for abdominal pain and abdominal distention.  Genitourinary: Negative for dysuria, frequency and menstrual problem.  Musculoskeletal: Negative for myalgias, joint swelling and arthralgias.  Skin: Negative for rash and wound.  Neurological: Negative for dizziness, weakness and headaches.  Hematological: Negative for adenopathy. Does not bruise/bleed easily.  Psychiatric/Behavioral: Negative for sleep disturbance and decreased concentration.       Objective:   Physical Exam  Constitutional: She is oriented to person, place, and time. She appears well-developed and well-nourished. No distress.  HENT:  Head: Normocephalic and atraumatic.  Right Ear: External ear normal.  Left Ear: External ear normal.  Nose: Nose normal.  Mouth/Throat: Oropharynx is clear and moist.  Eyes: Conjunctivae and EOM are normal. Pupils are equal, round, and reactive to light.  Neck: Normal range of motion. Neck supple. No JVD present. No tracheal deviation present. No thyromegaly present.  Cardiovascular: Normal rate, regular rhythm, normal heart sounds and intact distal pulses.   No murmur heard. Pulmonary/Chest: Effort normal and breath sounds normal. She has no wheezes. She exhibits no tenderness.  Abdominal: Soft. Bowel sounds are normal.  Musculoskeletal: Normal range of motion. She exhibits no edema and no tenderness.  Lymphadenopathy:    She has no cervical adenopathy.  Neurological: She is alert and oriented to person, place, and  time. She has normal reflexes. No cranial nerve deficit.  Skin: Skin is warm and dry. She is not diaphoretic.  Psychiatric: She has a normal mood and affect. Her behavior is normal.          Assessment & Plan:  Monitor blood pressure If she is in pain at the time of which was being monitored and he suggested that she wait 10-15 minutes recheck her blood pressure Not change her medication at this time and if the blood pressure persists we'll consider adding a beta blocker

## 2011-10-16 DIAGNOSIS — Z96649 Presence of unspecified artificial hip joint: Secondary | ICD-10-CM | POA: Diagnosis not present

## 2011-10-16 DIAGNOSIS — Z96659 Presence of unspecified artificial knee joint: Secondary | ICD-10-CM | POA: Diagnosis not present

## 2011-10-16 DIAGNOSIS — Z79899 Other long term (current) drug therapy: Secondary | ICD-10-CM | POA: Diagnosis not present

## 2011-10-16 DIAGNOSIS — Z9889 Other specified postprocedural states: Secondary | ICD-10-CM | POA: Diagnosis not present

## 2011-10-16 DIAGNOSIS — Z8719 Personal history of other diseases of the digestive system: Secondary | ICD-10-CM | POA: Diagnosis not present

## 2011-10-16 DIAGNOSIS — Z9049 Acquired absence of other specified parts of digestive tract: Secondary | ICD-10-CM | POA: Diagnosis not present

## 2011-10-16 DIAGNOSIS — Z961 Presence of intraocular lens: Secondary | ICD-10-CM | POA: Diagnosis not present

## 2011-10-16 DIAGNOSIS — Z981 Arthrodesis status: Secondary | ICD-10-CM | POA: Diagnosis not present

## 2011-10-16 DIAGNOSIS — M549 Dorsalgia, unspecified: Secondary | ICD-10-CM | POA: Diagnosis not present

## 2011-10-16 DIAGNOSIS — H5000 Unspecified esotropia: Secondary | ICD-10-CM | POA: Diagnosis not present

## 2011-10-24 DIAGNOSIS — L821 Other seborrheic keratosis: Secondary | ICD-10-CM | POA: Diagnosis not present

## 2011-10-24 DIAGNOSIS — D239 Other benign neoplasm of skin, unspecified: Secondary | ICD-10-CM | POA: Diagnosis not present

## 2011-10-24 DIAGNOSIS — L219 Seborrheic dermatitis, unspecified: Secondary | ICD-10-CM | POA: Diagnosis not present

## 2011-10-24 DIAGNOSIS — Z85828 Personal history of other malignant neoplasm of skin: Secondary | ICD-10-CM | POA: Diagnosis not present

## 2011-10-24 DIAGNOSIS — L719 Rosacea, unspecified: Secondary | ICD-10-CM | POA: Diagnosis not present

## 2011-11-26 DIAGNOSIS — H353190 Nonexudative age-related macular degeneration, unspecified eye, stage unspecified: Secondary | ICD-10-CM | POA: Diagnosis not present

## 2011-11-26 DIAGNOSIS — H35369 Drusen (degenerative) of macula, unspecified eye: Secondary | ICD-10-CM | POA: Diagnosis not present

## 2011-11-26 DIAGNOSIS — H35419 Lattice degeneration of retina, unspecified eye: Secondary | ICD-10-CM | POA: Diagnosis not present

## 2011-12-11 ENCOUNTER — Ambulatory Visit: Payer: Medicare Other | Admitting: Internal Medicine

## 2011-12-11 DIAGNOSIS — M1610 Unilateral primary osteoarthritis, unspecified hip: Secondary | ICD-10-CM | POA: Diagnosis not present

## 2011-12-11 DIAGNOSIS — Z96649 Presence of unspecified artificial hip joint: Secondary | ICD-10-CM | POA: Diagnosis not present

## 2011-12-11 DIAGNOSIS — Z471 Aftercare following joint replacement surgery: Secondary | ICD-10-CM | POA: Diagnosis not present

## 2011-12-11 DIAGNOSIS — Z96659 Presence of unspecified artificial knee joint: Secondary | ICD-10-CM | POA: Diagnosis not present

## 2011-12-11 DIAGNOSIS — M25469 Effusion, unspecified knee: Secondary | ICD-10-CM | POA: Diagnosis not present

## 2011-12-11 DIAGNOSIS — M169 Osteoarthritis of hip, unspecified: Secondary | ICD-10-CM | POA: Diagnosis not present

## 2011-12-11 DIAGNOSIS — M1710 Unilateral primary osteoarthritis, unspecified knee: Secondary | ICD-10-CM | POA: Diagnosis not present

## 2011-12-13 ENCOUNTER — Ambulatory Visit (INDEPENDENT_AMBULATORY_CARE_PROVIDER_SITE_OTHER): Payer: Medicare Other | Admitting: Internal Medicine

## 2011-12-13 ENCOUNTER — Encounter: Payer: Self-pay | Admitting: Internal Medicine

## 2011-12-13 VITALS — BP 126/78 | HR 72 | Temp 98.1°F | Resp 16 | Ht 62.0 in | Wt 135.0 lb

## 2011-12-13 DIAGNOSIS — M549 Dorsalgia, unspecified: Secondary | ICD-10-CM

## 2011-12-13 DIAGNOSIS — Z96651 Presence of right artificial knee joint: Secondary | ICD-10-CM | POA: Insufficient documentation

## 2011-12-13 DIAGNOSIS — Z96659 Presence of unspecified artificial knee joint: Secondary | ICD-10-CM | POA: Diagnosis not present

## 2011-12-13 DIAGNOSIS — I10 Essential (primary) hypertension: Secondary | ICD-10-CM | POA: Diagnosis not present

## 2011-12-13 MED ORDER — TIZANIDINE HCL 2 MG PO TABS: 2.0000 mg | ORAL_TABLET | Freq: Four times a day (QID) | ORAL | Status: DC | PRN

## 2011-12-13 NOTE — Progress Notes (Signed)
  Subjective:    Patient ID: Tanya Leon, female    DOB: [birth year], 71 y.o.   MRN: [id number]  HPI This is a 71 year old white female who presents for followup of multiple medical problems which are all stable.  She recently underwent surgery for these atrophic BX of right thigh and has had successful surgery and has appropriate tracking of her eyes at this time.  She has a history of elevation of HDL cholesterol. History of back pain which is now well-controlled and moderate osteoarthritis Blood pressure is well controlled   Review of Systems  Constitutional: Negative for activity change, appetite change and fatigue.  HENT: Negative for ear pain, congestion, neck pain, postnasal drip and sinus pressure.   Eyes: Negative for redness and visual disturbance.  Respiratory: Negative for cough, shortness of breath and wheezing.   Gastrointestinal: Negative for abdominal pain and abdominal distention.  Genitourinary: Negative for dysuria, frequency and menstrual problem.  Musculoskeletal: Negative for myalgias, joint swelling and arthralgias.  Skin: Negative for rash and wound.  Neurological: Negative for dizziness, weakness and headaches.  Hematological: Negative for adenopathy. Does not bruise/bleed easily.  Psychiatric/Behavioral: Negative for sleep disturbance and decreased concentration.       Objective:   Physical Exam  Constitutional: She is oriented to person, place, and time. She appears well-developed and well-nourished. No distress.  HENT:  Head: Normocephalic and atraumatic.  Right Ear: External ear normal.  Left Ear: External ear normal.  Nose: Nose normal.  Mouth/Throat: Oropharynx is clear and moist.  Eyes: Conjunctivae and EOM are normal. Pupils are equal, round, and reactive to light.  Neck: Normal range of motion. Neck supple. No JVD present. No tracheal deviation present. No thyromegaly present.  Cardiovascular: Normal rate, regular rhythm, normal heart sounds and  intact distal pulses.   No murmur heard. Pulmonary/Chest: Effort normal and breath sounds normal. She has no wheezes. She exhibits no tenderness.  Abdominal: Soft. Bowel sounds are normal.  Musculoskeletal: Normal range of motion. She exhibits no edema and no tenderness.  Lymphadenopathy:    She has no cervical adenopathy.  Neurological: She is alert and oriented to person, place, and time. She has normal reflexes. No cranial nerve deficit.  Skin: Skin is warm and dry. She is not diaphoretic.  Psychiatric: She has a normal mood and affect. Her behavior is normal.          Assessment & Plan:  Back spasms  Will refill the zanaflex Repair of vision successful Knee replacements stable alergies stable

## 2011-12-13 NOTE — Patient Instructions (Signed)
The patient is instructed to continue all medications as prescribed. Schedule followup with check out clerk upon leaving the clinic  

## 2011-12-16 DIAGNOSIS — H0019 Chalazion unspecified eye, unspecified eyelid: Secondary | ICD-10-CM | POA: Diagnosis not present

## 2011-12-16 DIAGNOSIS — H01009 Unspecified blepharitis unspecified eye, unspecified eyelid: Secondary | ICD-10-CM | POA: Diagnosis not present

## 2011-12-16 DIAGNOSIS — H04129 Dry eye syndrome of unspecified lacrimal gland: Secondary | ICD-10-CM | POA: Diagnosis not present

## 2011-12-16 DIAGNOSIS — H1045 Other chronic allergic conjunctivitis: Secondary | ICD-10-CM | POA: Diagnosis not present

## 2011-12-20 ENCOUNTER — Other Ambulatory Visit: Payer: Self-pay | Admitting: *Deleted

## 2011-12-20 MED ORDER — DULOXETINE HCL 60 MG PO CPEP: 60.0000 mg | ORAL_CAPSULE | Freq: Every day | ORAL | Status: DC

## 2011-12-24 ENCOUNTER — Other Ambulatory Visit: Payer: Self-pay | Admitting: *Deleted

## 2011-12-24 DIAGNOSIS — M1710 Unilateral primary osteoarthritis, unspecified knee: Secondary | ICD-10-CM | POA: Diagnosis not present

## 2011-12-24 DIAGNOSIS — M25569 Pain in unspecified knee: Secondary | ICD-10-CM | POA: Diagnosis not present

## 2012-01-07 ENCOUNTER — Other Ambulatory Visit: Payer: Self-pay | Admitting: Internal Medicine

## 2012-01-07 MED ORDER — FLUTICASONE PROPIONATE 50 MCG/ACT NA SUSP: 2.0000 | Freq: Every day | NASAL | Status: DC

## 2012-01-07 NOTE — Telephone Encounter (Signed)
Pt need generic flonase #3 with refills call into [facility] [place] [phone]

## 2012-01-07 NOTE — Telephone Encounter (Signed)
done

## 2012-01-07 NOTE — Telephone Encounter (Signed)
Done

## 2012-02-06 DIAGNOSIS — H02829 Cysts of unspecified eye, unspecified eyelid: Secondary | ICD-10-CM | POA: Diagnosis not present

## 2012-03-04 DIAGNOSIS — H01009 Unspecified blepharitis unspecified eye, unspecified eyelid: Secondary | ICD-10-CM | POA: Diagnosis not present

## 2012-03-04 DIAGNOSIS — H00029 Hordeolum internum unspecified eye, unspecified eyelid: Secondary | ICD-10-CM | POA: Diagnosis not present

## 2012-03-20 ENCOUNTER — Other Ambulatory Visit: Payer: Self-pay | Admitting: *Deleted

## 2012-03-20 MED ORDER — FLUTICASONE PROPIONATE 50 MCG/ACT NA SUSP: 2.0000 | Freq: Every day | NASAL | Status: DC

## 2012-03-20 MED ORDER — ZOLPIDEM TARTRATE 5 MG PO TABS: 5.0000 mg | ORAL_TABLET | Freq: Every evening | ORAL | Status: DC | PRN

## 2012-03-20 MED ORDER — DULOXETINE HCL 60 MG PO CPEP: 60.0000 mg | ORAL_CAPSULE | Freq: Every day | ORAL | Status: DC

## 2012-03-23 ENCOUNTER — Other Ambulatory Visit: Payer: Self-pay | Admitting: *Deleted

## 2012-03-23 MED ORDER — PREGABALIN 75 MG PO CAPS: 75.0000 mg | ORAL_CAPSULE | Freq: Two times a day (BID) | ORAL | Status: DC

## 2012-03-23 MED ORDER — ZOLPIDEM TARTRATE 5 MG SL SUBL: 1.0000 | SUBLINGUAL_TABLET | Freq: Every day | SUBLINGUAL | Status: DC

## 2012-03-23 MED ORDER — FLUTICASONE PROPIONATE 50 MCG/ACT NA SUSP: 2.0000 | Freq: Every day | NASAL | Status: DC

## 2012-03-23 MED ORDER — DULOXETINE HCL 60 MG PO CPEP: 60.0000 mg | ORAL_CAPSULE | Freq: Every day | ORAL | Status: DC

## 2012-03-24 ENCOUNTER — Other Ambulatory Visit: Payer: Self-pay | Admitting: *Deleted

## 2012-03-24 MED ORDER — FLUTICASONE PROPIONATE 50 MCG/ACT NA SUSP: 2.0000 | Freq: Every day | NASAL | Status: DC

## 2012-03-24 MED ORDER — DULOXETINE HCL 60 MG PO CPEP: 60.0000 mg | ORAL_CAPSULE | Freq: Every day | ORAL | Status: DC

## 2012-04-13 ENCOUNTER — Encounter: Payer: Self-pay | Admitting: Internal Medicine

## 2012-04-13 ENCOUNTER — Ambulatory Visit (INDEPENDENT_AMBULATORY_CARE_PROVIDER_SITE_OTHER): Payer: Medicare Other | Admitting: Internal Medicine

## 2012-04-13 VITALS — BP 120/70 | HR 72 | Temp 98.6°F | Resp 16 | Ht 62.0 in | Wt 134.0 lb

## 2012-04-13 DIAGNOSIS — K5900 Constipation, unspecified: Secondary | ICD-10-CM | POA: Diagnosis not present

## 2012-04-13 LAB — BASIC METABOLIC PANEL
BUN: 20 mg/dL (ref 6–23)
CO2: 28 mEq/L (ref 19–32)
Chloride: 100 mEq/L (ref 96–112)
Creatinine, Ser: 0.8 mg/dL (ref 0.4–1.2)
Glucose, Bld: 85 mg/dL (ref 70–99)
Potassium: 4.3 mEq/L (ref 3.5–5.1)

## 2012-04-13 NOTE — Progress Notes (Signed)
Subjective:    Patient ID: Tanya Leon, female    DOB: [birth year], 71 y.o.   MRN: [id number]  HPI    Review of Systems  Constitutional: Negative for activity change, appetite change and fatigue.  HENT: Negative for ear pain, congestion, neck pain, postnasal drip and sinus pressure.   Eyes: Negative for redness and visual disturbance.  Respiratory: Negative for cough, shortness of breath and wheezing.   Gastrointestinal: Negative for abdominal pain and abdominal distention.  Genitourinary: Negative for dysuria, frequency and menstrual problem.  Musculoskeletal: Negative for myalgias, joint swelling and arthralgias.  Skin: Negative for rash and wound.  Neurological: Negative for dizziness, weakness and headaches.  Hematological: Negative for adenopathy. Does not bruise/bleed easily.  Psychiatric/Behavioral: Negative for disturbed wake/sleep cycle and decreased concentration.  . Past Medical History  Diagnosis Date  . Osteoporosis   . Constipation   . Low back pain   . Lumbar spondylolysis   . Arthritis   . Depression   . Diverticulosis     History   Social History  . Marital Status: Married    Spouse Name: N/A    Number of Children: N/A  . Years of Education: N/A   Occupational History  . cpa    Social History Main Topics  . Smoking status: Never Smoker   . Smokeless tobacco: Not on file  . Alcohol Use: Yes  . Drug Use: No  . Sexually Active: Yes   Other Topics Concern  . Not on file   Social History Narrative  . No narrative on file    Past Surgical History  Procedure Date  . Tonsillectomly   . Abdominal hysterectomy   . Perforated deverticular abscess/laporotomy and colostomy   . Take down of colostomy   . Back fu sion   . Joint replacement   . Hernia repair 05/28/11    Family History  Problem Relation Age of Onset  . Alzheimer's disease    . Heart disease Father   . Cancer Mother     No Known Allergies  Current Outpatient Prescriptions on  File Prior to Visit  Medication Sig Dispense Refill  . aspirin-acetaminophen-caffeine (EXCEDRIN EXTRA STRENGTH) 250-250-65 MG per tablet Take 1 tablet by mouth every 6 (six) hours as needed.        Marland Kitchen azelastine (OPTIVAR) 0.05 % ophthalmic solution 1 drop 2 (two) times daily.        . B Complex-C-Folic Acid (MULTIVITAMIN, STRESS FORMULA) tablet Take 1 tablet by mouth daily.        . bisacodyl (BISACODYL) 5 MG EC tablet Take 5 mg by mouth daily as needed.        . calcium-vitamin D (OSCAL WITH D 500-200) 500-200 MG-UNIT per tablet Take 1 tablet by mouth 3 (three) times daily.        . Cholecalciferol (VITAMIN D) 1000 UNITS capsule Take 1,000 Units by mouth daily.        . Ciclopirox 1 % shampoo       . DULoxetine (CYMBALTA) 60 MG capsule Take 1 capsule (60 mg total) by mouth daily.  90 capsule  3  . estradiol (VIVELLE-DOT) 0.05 MG/24HR Place 1 patch onto the skin. 2 times a week       . fluorometholone (FML) 0.1 % ophthalmic suspension Place 1 drop into both eyes daily.       . fluticasone (FLONASE) 50 MCG/ACT nasal spray Place 2 sprays into the nose daily.  48 g  3  . ketoconazole (  NIZORAL) 2 % shampoo       . Multiple Vitamins-Minerals (PRESERVISION/LUTEIN) CAPS Take by mouth daily.        Marland Kitchen omega-3 acid ethyl esters (LOVAZA) 1 G capsule Take 2 g by mouth daily.        . polyethylene glycol (MIRALAX) powder Take 17 g by mouth daily.       . pregabalin (LYRICA) 75 MG capsule Take 1 capsule (75 mg total) by mouth 2 (two) times daily.  180 capsule  1  . Probiotic Product (ALIGN) 4 MG CAPS Take by mouth daily.        . RESTASIS 0.05 % ophthalmic emulsion       . sennosides-docusate sodium (SENOKOT-S) 8.6-50 MG tablet Take 1 tablet by mouth as needed.        . Sulfacetamide Sodium, Acne, 10 % LOTN       . tiZANidine (ZANAFLEX) 2 MG tablet Take 1 tablet (2 mg total) by mouth every 6 (six) hours as needed.  60 tablet  3  . zolpidem (AMBIEN) 5 MG tablet Take 1 tablet (5 mg total) by mouth at bedtime  as needed.  90 tablet  1  . DISCONTD: Zolpidem Tartrate 5 MG SUBL Place 1 tablet (5 mg total) under the tongue daily.  90 tablet  1    BP 120/70  Pulse 72  Temp 98.6 F (37 C)  Resp 16  Ht 5\' 2"  (1.575 m)  Wt 134 lb (60.782 kg)  BMI 24.51 kg/m2        Objective:   Physical Exam  Constitutional: She is oriented to person, place, and time. She appears well-developed and well-nourished. No distress.  HENT:  Head: Normocephalic and atraumatic.  Right Ear: External ear normal.  Left Ear: External ear normal.  Nose: Nose normal.  Mouth/Throat: Oropharynx is clear and moist.  Eyes: Conjunctivae and EOM are normal. Pupils are equal, round, and reactive to light.  Neck: Normal range of motion. Neck supple. No JVD present. No tracheal deviation present. No thyromegaly present.  Cardiovascular: Normal rate, regular rhythm, normal heart sounds and intact distal pulses.   No murmur heard. Pulmonary/Chest: Effort normal and breath sounds normal. She has no wheezes. She exhibits no tenderness.  Abdominal: Soft. Bowel sounds are normal.  Musculoskeletal: Normal range of motion. She exhibits no edema and no tenderness.  Lymphadenopathy:    She has no cervical adenopathy.  Neurological: She is alert and oriented to person, place, and time. She has normal reflexes. No cranial nerve deficit.  Skin: Skin is warm and dry. She is not diaphoretic.  Psychiatric: She has a normal mood and affect. Her behavior is normal.          Assessment & Plan:

## 2012-04-13 NOTE — Patient Instructions (Addendum)
The patient is instructed to continue all medications as prescribed. Schedule followup with check out clerk upon leaving the clinic  

## 2012-06-09 DIAGNOSIS — H5000 Unspecified esotropia: Secondary | ICD-10-CM | POA: Diagnosis not present

## 2012-06-25 DIAGNOSIS — H353190 Nonexudative age-related macular degeneration, unspecified eye, stage unspecified: Secondary | ICD-10-CM | POA: Diagnosis not present

## 2012-10-07 DIAGNOSIS — Z1231 Encounter for screening mammogram for malignant neoplasm of breast: Secondary | ICD-10-CM | POA: Diagnosis not present

## 2012-10-12 ENCOUNTER — Other Ambulatory Visit: Payer: Self-pay | Admitting: Internal Medicine

## 2012-10-12 DIAGNOSIS — M549 Dorsalgia, unspecified: Secondary | ICD-10-CM

## 2012-10-12 DIAGNOSIS — Z96651 Presence of right artificial knee joint: Secondary | ICD-10-CM

## 2012-10-12 DIAGNOSIS — I10 Essential (primary) hypertension: Secondary | ICD-10-CM

## 2012-10-12 MED ORDER — PREGABALIN 75 MG PO CAPS: 75.0000 mg | ORAL_CAPSULE | Freq: Two times a day (BID) | ORAL | Status: DC

## 2012-10-12 MED ORDER — ZOLPIDEM TARTRATE 5 MG PO TABS: 5.0000 mg | ORAL_TABLET | Freq: Every evening | ORAL | Status: DC | PRN

## 2012-10-12 MED ORDER — DULOXETINE HCL 60 MG PO CPEP: 60.0000 mg | ORAL_CAPSULE | Freq: Every day | ORAL | Status: DC

## 2012-10-12 MED ORDER — FLUTICASONE PROPIONATE 50 MCG/ACT NA SUSP: 2.0000 | Freq: Every day | NASAL | Status: DC

## 2012-10-12 MED ORDER — TIZANIDINE HCL 2 MG PO TABS: 2.0000 mg | ORAL_TABLET | Freq: Four times a day (QID) | ORAL | Status: DC | PRN

## 2012-10-12 NOTE — Telephone Encounter (Signed)
done

## 2012-10-12 NOTE — Telephone Encounter (Signed)
Pt needs new rxs zolpidem 5 mg,cymbalta 60mg  ,tizanidine 2 mg #90 each and fluticasone #3 and lyrica 75 mg #180 fax to [facility] order pharm [phone]

## 2012-10-14 DIAGNOSIS — H538 Other visual disturbances: Secondary | ICD-10-CM | POA: Diagnosis not present

## 2012-10-14 DIAGNOSIS — H04129 Dry eye syndrome of unspecified lacrimal gland: Secondary | ICD-10-CM | POA: Diagnosis not present

## 2012-10-14 DIAGNOSIS — H40019 Open angle with borderline findings, low risk, unspecified eye: Secondary | ICD-10-CM | POA: Diagnosis not present

## 2012-10-15 ENCOUNTER — Encounter: Payer: Medicare Other | Admitting: Internal Medicine

## 2012-10-20 ENCOUNTER — Encounter: Payer: Self-pay | Admitting: Internal Medicine

## 2012-11-24 DIAGNOSIS — H4420 Degenerative myopia, unspecified eye: Secondary | ICD-10-CM | POA: Diagnosis not present

## 2012-11-24 DIAGNOSIS — H35379 Puckering of macula, unspecified eye: Secondary | ICD-10-CM | POA: Diagnosis not present

## 2012-11-24 DIAGNOSIS — H353190 Nonexudative age-related macular degeneration, unspecified eye, stage unspecified: Secondary | ICD-10-CM | POA: Diagnosis not present

## 2012-11-24 DIAGNOSIS — H35419 Lattice degeneration of retina, unspecified eye: Secondary | ICD-10-CM | POA: Diagnosis not present

## 2012-12-16 DIAGNOSIS — Z09 Encounter for follow-up examination after completed treatment for conditions other than malignant neoplasm: Secondary | ICD-10-CM | POA: Diagnosis not present

## 2012-12-16 DIAGNOSIS — M169 Osteoarthritis of hip, unspecified: Secondary | ICD-10-CM | POA: Diagnosis not present

## 2012-12-16 DIAGNOSIS — M47817 Spondylosis without myelopathy or radiculopathy, lumbosacral region: Secondary | ICD-10-CM | POA: Diagnosis not present

## 2012-12-16 DIAGNOSIS — Z96659 Presence of unspecified artificial knee joint: Secondary | ICD-10-CM | POA: Diagnosis not present

## 2012-12-16 DIAGNOSIS — M25469 Effusion, unspecified knee: Secondary | ICD-10-CM | POA: Diagnosis not present

## 2012-12-16 DIAGNOSIS — Z471 Aftercare following joint replacement surgery: Secondary | ICD-10-CM | POA: Diagnosis not present

## 2012-12-16 DIAGNOSIS — Z96649 Presence of unspecified artificial hip joint: Secondary | ICD-10-CM | POA: Diagnosis not present

## 2012-12-16 DIAGNOSIS — IMO0002 Reserved for concepts with insufficient information to code with codable children: Secondary | ICD-10-CM | POA: Diagnosis not present

## 2012-12-16 DIAGNOSIS — Z9889 Other specified postprocedural states: Secondary | ICD-10-CM | POA: Diagnosis not present

## 2012-12-16 DIAGNOSIS — M1610 Unilateral primary osteoarthritis, unspecified hip: Secondary | ICD-10-CM | POA: Diagnosis not present

## 2012-12-29 ENCOUNTER — Other Ambulatory Visit: Payer: Self-pay | Admitting: Obstetrics and Gynecology

## 2012-12-29 DIAGNOSIS — Z124 Encounter for screening for malignant neoplasm of cervix: Secondary | ICD-10-CM | POA: Diagnosis not present

## 2013-01-18 ENCOUNTER — Encounter: Payer: Self-pay | Admitting: Internal Medicine

## 2013-01-18 ENCOUNTER — Ambulatory Visit (INDEPENDENT_AMBULATORY_CARE_PROVIDER_SITE_OTHER): Payer: Medicare Other | Admitting: Internal Medicine

## 2013-01-18 VITALS — BP 124/80 | HR 72 | Temp 98.2°F | Resp 16 | Ht 62.0 in | Wt 136.0 lb

## 2013-01-18 DIAGNOSIS — T887XXA Unspecified adverse effect of drug or medicament, initial encounter: Secondary | ICD-10-CM

## 2013-01-18 DIAGNOSIS — M48061 Spinal stenosis, lumbar region without neurogenic claudication: Secondary | ICD-10-CM

## 2013-01-18 DIAGNOSIS — K5909 Other constipation: Secondary | ICD-10-CM | POA: Diagnosis not present

## 2013-01-18 DIAGNOSIS — K5900 Constipation, unspecified: Secondary | ICD-10-CM

## 2013-01-18 DIAGNOSIS — E785 Hyperlipidemia, unspecified: Secondary | ICD-10-CM | POA: Diagnosis not present

## 2013-01-18 DIAGNOSIS — Z0000 Encounter for general adult medical examination without abnormal findings: Secondary | ICD-10-CM

## 2013-01-18 DIAGNOSIS — G4700 Insomnia, unspecified: Secondary | ICD-10-CM

## 2013-01-18 DIAGNOSIS — K5904 Chronic idiopathic constipation: Secondary | ICD-10-CM

## 2013-01-18 DIAGNOSIS — J309 Allergic rhinitis, unspecified: Secondary | ICD-10-CM | POA: Diagnosis not present

## 2013-01-18 LAB — CBC WITH DIFFERENTIAL/PLATELET
Basophils Relative: 0.8 % (ref 0.0–3.0)
Eosinophils Absolute: 0.1 10*3/uL (ref 0.0–0.7)
Eosinophils Relative: 1.3 % (ref 0.0–5.0)
HCT: 41.8 % (ref 36.0–46.0)
Lymphs Abs: 1.6 10*3/uL (ref 0.7–4.0)
MCHC: 33.2 g/dL (ref 30.0–36.0)
MCV: 91.8 fl (ref 78.0–100.0)
Monocytes Absolute: 0.4 10*3/uL (ref 0.1–1.0)
Platelets: 142 10*3/uL — ABNORMAL LOW (ref 150.0–400.0)
RBC: 4.55 Mil/uL (ref 3.87–5.11)
WBC: 4.6 10*3/uL (ref 4.5–10.5)

## 2013-01-18 LAB — BASIC METABOLIC PANEL
BUN: 14 mg/dL (ref 6–23)
CO2: 30 mEq/L (ref 19–32)
Calcium: 8.8 mg/dL (ref 8.4–10.5)
GFR: 71.6 mL/min (ref >60.00)
Glucose, Bld: 84 mg/dL (ref 70–99)
Sodium: 139 mEq/L (ref 135–145)

## 2013-01-18 LAB — POCT URINALYSIS DIPSTICK
Bilirubin, UA: NEGATIVE
Blood, UA: NEGATIVE
Ketones, UA: NEGATIVE
pH, UA: 7

## 2013-01-18 LAB — HEPATIC FUNCTION PANEL
Albumin: 4.1 g/dL (ref 3.5–5.2)
Alkaline Phosphatase: 53 U/L (ref 39–117)
Total Protein: 6.4 g/dL (ref 6.0–8.3)

## 2013-01-18 LAB — LIPID PANEL
Cholesterol: 154 mg/dL (ref 0–200)
HDL: 70.2 mg/dL (ref >39.00)

## 2013-01-18 MED ORDER — LINACLOTIDE 145 MCG PO CAPS: 145.0000 ug | ORAL_CAPSULE | Freq: Every day | ORAL | Status: DC

## 2013-01-18 NOTE — Patient Instructions (Signed)
The patient is instructed to continue all medications as prescribed. Schedule followup with check out clerk upon leaving the clinic  

## 2013-01-18 NOTE — Progress Notes (Signed)
Subjective:    Patient ID: ANALYSE ANGST, female    DOB: Aug 07, 1941, 72 y.o.   MRN: [id number]  HPI Patient is a 72 year old female who presents for her Medicare wellness examination and followup of chronic problems primarily followed for her drug-induced constipation related to her chronic back pain in the use of pain medications.  She is stable on her current medication regimen but she states that she is having to take additional MiraLax to achieve normal bowel movements   Review of Systems  Constitutional: Negative for activity change, appetite change and fatigue.  HENT: Negative for ear pain, congestion, neck pain, postnasal drip and sinus pressure.   Eyes: Negative for redness and visual disturbance.  Respiratory: Negative for cough, shortness of breath and wheezing.   Gastrointestinal: Positive for constipation and abdominal distention. Negative for abdominal pain.  Genitourinary: Negative for dysuria, frequency and menstrual problem.  Musculoskeletal: Negative for myalgias, joint swelling and arthralgias.  Skin: Negative for rash and wound.  Neurological: Negative for dizziness, weakness and headaches.  Hematological: Negative for adenopathy. Does not bruise/bleed easily.  Psychiatric/Behavioral: Negative for sleep disturbance and decreased concentration.   Past Medical History  Diagnosis Date  . Osteoporosis   . Constipation   . Low back pain   . Lumbar spondylolysis   . Arthritis   . Depression   . Diverticulosis     History   Social History  . Marital Status: Married    Spouse Name: N/A    Number of Children: N/A  . Years of Education: N/A   Occupational History  . cpa    Social History Main Topics  . Smoking status: Never Smoker   . Smokeless tobacco: Not on file  . Alcohol Use: Yes  . Drug Use: No  . Sexually Active: Yes   Other Topics Concern  . Not on file   Social History Narrative  . No narrative on file    Past Surgical History  Procedure  Laterality Date  . Tonsillectomly    . Abdominal hysterectomy    . Perforated deverticular abscess/laporotomy and colostomy    . Take down of colostomy    . Back fu sion    . Joint replacement    . Hernia repair  05/28/11    Family History  Problem Relation Age of Onset  . Alzheimer's disease    . Heart disease Father   . Cancer Mother     No Known Allergies  Current Outpatient Prescriptions on File Prior to Visit  Medication Sig Dispense Refill  . aspirin-acetaminophen-caffeine (EXCEDRIN EXTRA STRENGTH) 250-250-65 MG per tablet Take 1 tablet by mouth every 6 (six) hours as needed.        . B Complex-C-Folic Acid (MULTIVITAMIN, STRESS FORMULA) tablet Take 1 tablet by mouth daily.        . bisacodyl (BISACODYL) 5 MG EC tablet Take 5 mg by mouth daily as needed.        . calcium-vitamin D (OSCAL WITH D 500-200) 500-200 MG-UNIT per tablet Take 1 tablet by mouth 3 (three) times daily.        . Cholecalciferol (VITAMIN D) 1000 UNITS capsule Take 1,000 Units by mouth daily.        . Ciclopirox 1 % shampoo       . DULoxetine (CYMBALTA) 60 MG capsule Take 1 capsule (60 mg total) by mouth daily.  90 capsule  3  . estradiol (VIVELLE-DOT) 0.05 MG/24HR Place 1 patch onto the skin. 2 times a  week       . fluorometholone (FML) 0.1 % ophthalmic suspension Place 1 drop into both eyes daily.       . fluticasone (FLONASE) 50 MCG/ACT nasal spray Place 2 sprays into the nose daily.  48 g  3  . ketoconazole (NIZORAL) 2 % shampoo       . Multiple Vitamins-Minerals (PRESERVISION/LUTEIN) CAPS Take by mouth daily.        Marland Kitchen omega-3 acid ethyl esters (LOVAZA) 1 G capsule Take 2 g by mouth daily.        . polyethylene glycol (MIRALAX) powder Take 17 g by mouth daily.       . pregabalin (LYRICA) 75 MG capsule Take 1 capsule (75 mg total) by mouth 2 (two) times daily.  180 capsule  1  . Probiotic Product (ALIGN) 4 MG CAPS Take by mouth daily.        . RESTASIS 0.05 % ophthalmic emulsion       .  sennosides-docusate sodium (SENOKOT-S) 8.6-50 MG tablet Take 2 tablets by mouth as needed.       . Sulfacetamide Sodium, Acne, 10 % LOTN       . tiZANidine (ZANAFLEX) 2 MG tablet Take 1 tablet (2 mg total) by mouth every 6 (six) hours as needed.  180 tablet  3  . zolpidem (AMBIEN) 5 MG tablet Take 1 tablet (5 mg total) by mouth at bedtime as needed.  90 tablet  1   No current facility-administered medications on file prior to visit.    BP 124/80  Pulse 72  Temp(Src) 98.2 F (36.8 C)  Resp 16  Ht 5\' 2"  (1.575 m)  Wt 136 lb (61.689 kg)  BMI 24.87 kg/m2        Objective:   Physical Exam  Nursing note and vitals reviewed. Constitutional: She is oriented to person, place, and time. She appears well-developed and well-nourished. No distress.  HENT:  Head: Normocephalic and atraumatic.  Right Ear: External ear normal.  Left Ear: External ear normal.  Nose: Nose normal.  Mouth/Throat: Oropharynx is clear and moist.  Eyes: Conjunctivae and EOM are normal. Pupils are equal, round, and reactive to light.  Neck: Normal range of motion. Neck supple. No JVD present. No tracheal deviation present. No thyromegaly present.  Cardiovascular: Normal rate, regular rhythm and intact distal pulses.   Murmur heard. Pulmonary/Chest: Effort normal and breath sounds normal. She has no wheezes. She exhibits no tenderness.  Abdominal: Soft. Bowel sounds are normal. She exhibits no distension and no mass. There is tenderness. There is no rebound and no guarding.  Musculoskeletal: Normal range of motion. She exhibits no edema and no tenderness.  Lymphadenopathy:    She has no cervical adenopathy.  Neurological: She is alert and oriented to person, place, and time. She has normal reflexes. No cranial nerve deficit.  Skin: Skin is warm and dry. She is not diaphoretic.  Psychiatric: She has a normal mood and affect. Her behavior is normal.          Assessment & Plan:  We discussed a trial of Linzess  145 MCG And discontinue the MiraLax Chronic insomnia stable Back pain is stable Subjective:    RAISA DITTO is a 72 y.o. female who presents for Medicare Annual/Subsequent preventive examination.  Preventive Screening-Counseling & Management  Tobacco History  Smoking status  . Never Smoker   Smokeless tobacco  . Not on file     Problems Prior to Visit 1.   Current  Problems (verified) Patient Active Problem List  Diagnosis  . COLONIC POLYPS  . INSOMNIA, CHRONIC  . ALLERGIC RHINITIS  . DIVERTICULOSIS, COLON  . CONSTIPATION, DRUG INDUCED  . Status post left partial knee replacement  . Status post right hip replacement  . SPINAL STENOSIS, LUMBAR  . OSTEOPOROSIS  . Status post right partial knee replacement    Medications Prior to Visit Current Outpatient Prescriptions on File Prior to Visit  Medication Sig Dispense Refill  . aspirin-acetaminophen-caffeine (EXCEDRIN EXTRA STRENGTH) 250-250-65 MG per tablet Take 1 tablet by mouth every 6 (six) hours as needed.        . B Complex-C-Folic Acid (MULTIVITAMIN, STRESS FORMULA) tablet Take 1 tablet by mouth daily.        . bisacodyl (BISACODYL) 5 MG EC tablet Take 5 mg by mouth daily as needed.        . calcium-vitamin D (OSCAL WITH D 500-200) 500-200 MG-UNIT per tablet Take 1 tablet by mouth 3 (three) times daily.        . Cholecalciferol (VITAMIN D) 1000 UNITS capsule Take 1,000 Units by mouth daily.        . Ciclopirox 1 % shampoo       . DULoxetine (CYMBALTA) 60 MG capsule Take 1 capsule (60 mg total) by mouth daily.  90 capsule  3  . estradiol (VIVELLE-DOT) 0.05 MG/24HR Place 1 patch onto the skin. 2 times a week       . fluorometholone (FML) 0.1 % ophthalmic suspension Place 1 drop into both eyes daily.       . fluticasone (FLONASE) 50 MCG/ACT nasal spray Place 2 sprays into the nose daily.  48 g  3  . ketoconazole (NIZORAL) 2 % shampoo       . Multiple Vitamins-Minerals (PRESERVISION/LUTEIN) CAPS Take by mouth daily.         Marland Kitchen omega-3 acid ethyl esters (LOVAZA) 1 G capsule Take 2 g by mouth daily.        . polyethylene glycol (MIRALAX) powder Take 17 g by mouth daily.       . pregabalin (LYRICA) 75 MG capsule Take 1 capsule (75 mg total) by mouth 2 (two) times daily.  180 capsule  1  . Probiotic Product (ALIGN) 4 MG CAPS Take by mouth daily.        . RESTASIS 0.05 % ophthalmic emulsion       . sennosides-docusate sodium (SENOKOT-S) 8.6-50 MG tablet Take 2 tablets by mouth as needed.       . Sulfacetamide Sodium, Acne, 10 % LOTN       . tiZANidine (ZANAFLEX) 2 MG tablet Take 1 tablet (2 mg total) by mouth every 6 (six) hours as needed.  180 tablet  3  . zolpidem (AMBIEN) 5 MG tablet Take 1 tablet (5 mg total) by mouth at bedtime as needed.  90 tablet  1   No current facility-administered medications on file prior to visit.    Current Medications (verified) Current Outpatient Prescriptions  Medication Sig Dispense Refill  . aspirin-acetaminophen-caffeine (EXCEDRIN EXTRA STRENGTH) 250-250-65 MG per tablet Take 1 tablet by mouth every 6 (six) hours as needed.        . B Complex-C-Folic Acid (MULTIVITAMIN, STRESS FORMULA) tablet Take 1 tablet by mouth daily.        . bisacodyl (BISACODYL) 5 MG EC tablet Take 5 mg by mouth daily as needed.        . calcium-vitamin D (OSCAL WITH D 500-200) 500-200 MG-UNIT  per tablet Take 1 tablet by mouth 3 (three) times daily.        . Cholecalciferol (VITAMIN D) 1000 UNITS capsule Take 1,000 Units by mouth daily.        . Ciclopirox 1 % shampoo       . DULoxetine (CYMBALTA) 60 MG capsule Take 1 capsule (60 mg total) by mouth daily.  90 capsule  3  . estradiol (VIVELLE-DOT) 0.05 MG/24HR Place 1 patch onto the skin. 2 times a week       . fluorometholone (FML) 0.1 % ophthalmic suspension Place 1 drop into both eyes daily.       . fluticasone (FLONASE) 50 MCG/ACT nasal spray Place 2 sprays into the nose daily.  48 g  3  . ketoconazole (NIZORAL) 2 % shampoo       . Multiple  Vitamins-Minerals (PRESERVISION/LUTEIN) CAPS Take by mouth daily.        . Olopatadine HCl (PATADAY) 0.2 % SOLN Apply to eye.      . omega-3 acid ethyl esters (LOVAZA) 1 G capsule Take 2 g by mouth daily.        . polyethylene glycol (MIRALAX) powder Take 17 g by mouth daily.       . pregabalin (LYRICA) 75 MG capsule Take 1 capsule (75 mg total) by mouth 2 (two) times daily.  180 capsule  1  . Probiotic Product (ALIGN) 4 MG CAPS Take by mouth daily.        . RESTASIS 0.05 % ophthalmic emulsion       . sennosides-docusate sodium (SENOKOT-S) 8.6-50 MG tablet Take 2 tablets by mouth as needed.       . Sulfacetamide Sodium, Acne, 10 % LOTN       . tiZANidine (ZANAFLEX) 2 MG tablet Take 1 tablet (2 mg total) by mouth every 6 (six) hours as needed.  180 tablet  3  . zolpidem (AMBIEN) 5 MG tablet Take 1 tablet (5 mg total) by mouth at bedtime as needed.  90 tablet  1   No current facility-administered medications for this visit.     Allergies (verified) Review of patient's allergies indicates no known allergies.   PAST HISTORY  Family History Family History  Problem Relation Age of Onset  . Alzheimer's disease    . Heart disease Father   . Cancer Mother     Social History History  Substance Use Topics  . Smoking status: Never Smoker   . Smokeless tobacco: Not on file  . Alcohol Use: Yes     Are there smokers in your home (other than you)? No  Risk Factors Current exercise habits: The patient does not participate in regular exercise at present.  Dietary issues discussed: stable   Cardiac risk factors: advanced age (older than 36 for men, 76 for women), hypertension and sedentary lifestyle.  Depression Screen (Note: if answer to either of the following is "Yes", a more complete depression screening is indicated)   Over the past two weeks, have you felt down, depressed or hopeless? No  Over the past two weeks, have you felt little interest or pleasure in doing things? No  Have  you lost interest or pleasure in daily life? No  Do you often feel hopeless? No  Do you cry easily over simple problems? No  Activities of Daily Living In your present state of health, do you have any difficulty performing the following activities?:  Driving? No Managing money?  No Feeding yourself? no Getting from  bed to chair? No Climbing a flight of stairs? No Preparing food and eating?: No Bathing or showering? No Getting dressed: No Getting to the toilet? No Using the toilet:No Moving around from place to place: No In the past year have you fallen or had a near fall?:No   Are you sexually active?  Yes  Do you have more than one partner?  No  Hearing Difficulties: No Do you often ask people to speak up or repeat themselves? No Do you experience ringing or noises in your ears? No Do you have difficulty understanding soft or whispered voices? No   Do you feel that you have a problem with memory? No  Do you often misplace items? No  Do you feel safe at home?  No  Cognitive Testing  Alert? Yes  Normal Appearance?Yes  Oriented to person? Yes  Place? Yes   Time? Yes  Recall of three objects?  Yes  Can perform simple calculations? Yes  Displays appropriate judgment?Yes  Can read the correct time from a watch face?Yes   Advanced Directives have been discussed with the patient? Yes  List the Names of Other Physician/Practitioners you currently use: 1.    Indicate any recent Medical Services you may have received from other than [facility] providers in the past year (date may be approximate).  Immunization History  Administered Date(s) Administered  . H1N1 09/13/2008  . Influenza Whole 06/24/2008, 06/26/2009, 08/01/2010  . Pneumococcal Polysaccharide 12/30/2005  . Td 08/01/2010  . Zoster 10/08/2007    Screening Tests Health Maintenance  Topic Date Due  . Influenza Vaccine  05/31/2013  . Colonoscopy  01/27/2019  . Tetanus/tdap  08/01/2020  . Pneumococcal  Polysaccharide Vaccine Age 13 And Over  Completed  . Zostavax  Completed    All answers were reviewed with the patient and necessary referrals were made:  Carrie Mew, MD   01/18/2013   History reviewed: allergies, current medications, past family history, past medical history, past social history, past surgical history and problem list  Review of Systems Pertinent items are noted in HPI.    Objective:     Vision by Snellen chart: right eye:20/20, left eye:20/20  Body mass index is 24.87 kg/(m^2). BP 124/80  Pulse 72  Temp(Src) 98.2 F (36.8 C)  Resp 16  Ht 5\' 2"  (1.575 m)  Wt 136 lb (61.689 kg)  BMI 24.87 kg/m2  BP 124/80  Pulse 72  Temp(Src) 98.2 F (36.8 C)  Resp 16  Ht 5\' 2"  (1.575 m)  Wt 136 lb (61.689 kg)  BMI 24.87 kg/m2  General Appearance:    Alert, cooperative, no distress, appears stated age  Head:    Normocephalic, without obvious abnormality, atraumatic  Eyes:    PERRL, conjunctiva/corneas clear, EOM's intact, fundi    benign, both eyes  Ears:    Normal TM's and external ear canals, both ears  Nose:   Nares normal, septum midline, mucosa normal, no drainage    or sinus tenderness  Throat:   Lips, mucosa, and tongue normal; teeth and gums normal  Neck:   Supple, symmetrical, trachea midline, no adenopathy;    thyroid:  no enlargement/tenderness/nodules; no carotid   bruit or JVD  Back:     Symmetric, no curvature, ROM normal, no CVA tenderness  Lungs:     Clear to auscultation bilaterally, respirations unlabored  Chest Wall:    No tenderness or deformity   Heart:    Regular rate and rhythm, S1 and S2 normal, no murmur,  rub   or gallop  Breast Exam:    No tenderness, masses, or nipple abnormality  Abdomen:     Soft, non-tender, bowel sounds active all four quadrants,    no masses, no organomegaly  Genitalia:    Normal female without lesion, discharge or tenderness  Rectal:    Normal tone, normal prostate, no masses or tenderness;   guaiac  negative stool  Extremities:   Extremities normal, atraumatic, no cyanosis or edema  Pulses:   2+ and symmetric all extremities  Skin:   Skin color, texture, turgor normal, no rashes or lesions  Lymph nodes:   Cervical, supraclavicular, and axillary nodes normal  Neurologic:   CNII-XII intact, normal strength, sensation and reflexes    throughout       Assessment:       This is a routine physical examination for this healthy  Female. Reviewed all health maintenance protocols including mammography colonoscopy bone density and reviewed appropriate screening labs. Her immunization history was reviewed as well as her current medications and allergies refills of her chronic medications were given and the plan for yearly health maintenance was discussed all orders and referrals were made as appropriate.      Plan:     During the course of the visit the patient was educated and counseled about appropriate screening and preventive services including:    Influenza vaccine  Screening mammography  Colorectal cancer screening  Diet review for nutrition referral? Yes ____  Not Indicated __x__   Patient Instructions (the written plan) was given to the patient.  Medicare Attestation I have personally reviewed: The patient's medical and social history Their use of alcohol, tobacco or illicit drugs Their current medications and supplements The patient's functional ability including ADLs,fall risks, home safety risks, cognitive, and hearing and visual impairment Diet and physical activities Evidence for depression or mood disorders  The patient's weight, height, BMI, and visual acuity have been recorded in the chart.  I have made referrals, counseling, and provided education to the patient based on review of the above and I have provided the patient with a written personalized care plan for preventive services.     Carrie Mew, MD   01/18/2013

## 2013-01-22 ENCOUNTER — Encounter: Payer: Self-pay | Admitting: Internal Medicine

## 2013-02-09 DIAGNOSIS — S9030XA Contusion of unspecified foot, initial encounter: Secondary | ICD-10-CM | POA: Diagnosis not present

## 2013-02-09 DIAGNOSIS — M25579 Pain in unspecified ankle and joints of unspecified foot: Secondary | ICD-10-CM | POA: Diagnosis not present

## 2013-04-05 ENCOUNTER — Telehealth: Payer: Self-pay | Admitting: Internal Medicine

## 2013-04-05 MED ORDER — LINACLOTIDE 290 MCG PO CAPS: 1.0000 | ORAL_CAPSULE | Freq: Every day | ORAL | Status: DC

## 2013-04-05 NOTE — Telephone Encounter (Signed)
290 sent in

## 2013-04-05 NOTE — Telephone Encounter (Signed)
Pt was ordered Linzess for constipation. Pt was prescribed 1/day but it is requiring 1 pills per day to get results. Please call script with increased [facility] [place].

## 2013-04-22 DIAGNOSIS — H40019 Open angle with borderline findings, low risk, unspecified eye: Secondary | ICD-10-CM | POA: Diagnosis not present

## 2013-04-22 DIAGNOSIS — H04129 Dry eye syndrome of unspecified lacrimal gland: Secondary | ICD-10-CM | POA: Diagnosis not present

## 2013-04-22 DIAGNOSIS — H538 Other visual disturbances: Secondary | ICD-10-CM | POA: Diagnosis not present

## 2013-04-26 ENCOUNTER — Encounter: Payer: Self-pay | Admitting: Internal Medicine

## 2013-04-26 ENCOUNTER — Ambulatory Visit (INDEPENDENT_AMBULATORY_CARE_PROVIDER_SITE_OTHER): Payer: Medicare Other | Admitting: Internal Medicine

## 2013-04-26 ENCOUNTER — Telehealth: Payer: Self-pay | Admitting: Internal Medicine

## 2013-04-26 ENCOUNTER — Other Ambulatory Visit: Payer: Self-pay | Admitting: *Deleted

## 2013-04-26 ENCOUNTER — Ambulatory Visit (INDEPENDENT_AMBULATORY_CARE_PROVIDER_SITE_OTHER)
Admission: RE | Admit: 2013-04-26 | Discharge: 2013-04-26 | Disposition: A | Discharge status: Home / Self Care | Payer: Medicare Other | Source: Ambulatory Visit | Attending: Internal Medicine | Admitting: Internal Medicine

## 2013-04-26 VITALS — BP 140/84 | HR 76 | Temp 98.2°F | Resp 16 | Ht 62.0 in | Wt 136.0 lb

## 2013-04-26 DIAGNOSIS — S79929A Unspecified injury of unspecified thigh, initial encounter: Secondary | ICD-10-CM | POA: Diagnosis not present

## 2013-04-26 DIAGNOSIS — S199XXA Unspecified injury of neck, initial encounter: Secondary | ICD-10-CM

## 2013-04-26 DIAGNOSIS — M542 Cervicalgia: Secondary | ICD-10-CM | POA: Diagnosis not present

## 2013-04-26 DIAGNOSIS — W109XXA Fall (on) (from) unspecified stairs and steps, initial encounter: Secondary | ICD-10-CM

## 2013-04-26 DIAGNOSIS — W108XXA Fall (on) (from) other stairs and steps, initial encounter: Secondary | ICD-10-CM

## 2013-04-26 DIAGNOSIS — IMO0002 Reserved for concepts with insufficient information to code with codable children: Secondary | ICD-10-CM

## 2013-04-26 DIAGNOSIS — S0993XA Unspecified injury of face, initial encounter: Secondary | ICD-10-CM

## 2013-04-26 DIAGNOSIS — S79919A Unspecified injury of unspecified hip, initial encounter: Secondary | ICD-10-CM | POA: Diagnosis not present

## 2013-04-26 DIAGNOSIS — S79919D Unspecified injury of unspecified hip, subsequent encounter: Secondary | ICD-10-CM

## 2013-04-26 DIAGNOSIS — M25559 Pain in unspecified hip: Secondary | ICD-10-CM | POA: Diagnosis not present

## 2013-04-26 NOTE — Telephone Encounter (Signed)
Patient Information:  Caller Name: Mack Guise  Phone: [phone]  Patient: Tanya Leon, Tanya Leon  Gender: Female  DOB: 1941-04-07  Age: 72 Years  PCP: Darryll Capers (Adults only)  Office Follow Up:  Does the office need to follow up with this patient?: No  Instructions For The Office: N/A  RN Note:  Larey Seat approximely 5 feet after missing step stricking hip and head on concrete.  No lossof consciousness or amnesia.  No vomiting.  Small abrasion on scalp noted at time of injury. Mild neck stiffness present.  Advised to call back if additional symptoms develop; may try intermittent heat to neck.      Symptoms  Reason For Call & Symptoms: Larey Seat  approximately 5 feet after missing step on 04/24/13 striking posterior head on concrete; landed on hip and hit head with neck tilted back.  Worked out with Systems analyst this morning.  No current headache.  Reviewed Health History In EMR: Yes  Reviewed Medications In EMR: Yes  Reviewed Allergies In EMR: Yes  Reviewed Surgeries / Procedures: Yes  Date of Onset of Symptoms: 04/24/2013  Treatments Tried: OTC pain medications  Treatments Tried Worked: Yes  Guideline(s) Used:  Head Injury  Disposition Per Guideline:   Home Care  Reason For Disposition Reached:   Minor head injury  Advice Given:  N/A  RN Overrode Recommendation:  Make Appointment  RN recommended office visit due to neck stiffness.  Appointment Scheduled:  04/26/2013 15:15:00 Appointment Scheduled Provider:  Darryll Capers (Adults only)

## 2013-04-26 NOTE — Patient Instructions (Signed)
May used to tizanidine at night for the stiffness proceed to get plain films now

## 2013-04-26 NOTE — Progress Notes (Signed)
  Subjective:    Patient ID: Tanya Leon, female    DOB: Mar 23, 1941, 72 y.o.   MRN: [id number]  HPI Fall backwards off the steps approximately 6 feet initially  Hit buttocks and then head No loss of consciousness Mental status changes The headaches post injury No nausea Probable whiplash-like injury with cervical stiffness Some progression and stiffness over the 48-hour period   Review of Systems  Constitutional: Negative for activity change, appetite change and fatigue.  HENT: Negative for ear pain, congestion, neck pain, postnasal drip and sinus pressure.   Eyes: Negative for redness and visual disturbance.  Respiratory: Negative for cough, shortness of breath and wheezing.   Gastrointestinal: Negative for abdominal pain and abdominal distention.  Genitourinary: Negative for dysuria, frequency and menstrual problem.  Musculoskeletal: Positive for myalgias, back pain and joint swelling. Negative for arthralgias.  Skin: Negative for rash and wound.  Neurological: Negative for dizziness, weakness and headaches.  Hematological: Negative for adenopathy. Does not bruise/bleed easily.  Psychiatric/Behavioral: Negative for sleep disturbance and decreased concentration.       Objective:   Physical Exam  Cardiovascular: Normal rate and regular rhythm.   Pulmonary/Chest: Effort normal and breath sounds normal.  Abdominal: Soft. Bowel sounds are normal.  Musculoskeletal: She exhibits edema and tenderness.  Neck tenderness          Assessment & Plan:  Alternate ice and heat to the side of the neck and we will prescribe a muscle relaxant.  I would recommend plain cervical films of the neck today And films of the  hip right

## 2013-04-26 NOTE — Telephone Encounter (Signed)
Ov this afternoon

## 2013-04-29 ENCOUNTER — Encounter: Payer: Self-pay | Admitting: Internal Medicine

## 2013-04-30 ENCOUNTER — Other Ambulatory Visit: Payer: Self-pay | Admitting: *Deleted

## 2013-04-30 MED ORDER — PREGABALIN 75 MG PO CAPS: 75.0000 mg | ORAL_CAPSULE | Freq: Two times a day (BID) | ORAL | Status: DC

## 2013-05-17 ENCOUNTER — Encounter: Payer: Self-pay | Admitting: Internal Medicine

## 2013-05-17 ENCOUNTER — Ambulatory Visit (INDEPENDENT_AMBULATORY_CARE_PROVIDER_SITE_OTHER): Payer: Medicare Other | Admitting: Internal Medicine

## 2013-05-17 DIAGNOSIS — T50901S Poisoning by unspecified drugs, medicaments and biological substances, accidental (unintentional), sequela: Secondary | ICD-10-CM

## 2013-05-17 DIAGNOSIS — I10 Essential (primary) hypertension: Secondary | ICD-10-CM | POA: Diagnosis not present

## 2013-05-17 DIAGNOSIS — Y33XXXS Other specified events, undetermined intent, sequela: Secondary | ICD-10-CM

## 2013-05-17 NOTE — Progress Notes (Signed)
  Subjective:    Patient ID: Tanya Leon, female    DOB: 12/09/1940, 72 y.o.   MRN: [id number]  HPI Chronic constipation   Review of Systems  Constitutional: Negative for activity change, appetite change and fatigue.  HENT: Negative for ear pain, congestion, neck pain, postnasal drip and sinus pressure.   Eyes: Negative for redness and visual disturbance.  Respiratory: Negative for cough, shortness of breath and wheezing.   Gastrointestinal: Negative for abdominal pain and abdominal distention.  Genitourinary: Negative for dysuria, frequency and menstrual problem.  Musculoskeletal: Negative for myalgias, joint swelling and arthralgias.  Skin: Negative for rash and wound.  Neurological: Negative for dizziness, weakness and headaches.  Hematological: Negative for adenopathy. Does not bruise/bleed easily.  Psychiatric/Behavioral: Negative for sleep disturbance and decreased concentration.       Objective:   Physical Exam  Nursing note and vitals reviewed. Constitutional: She is oriented to person, place, and time. She appears well-developed and well-nourished.  HENT:  Head: Normocephalic and atraumatic.  Eyes: Conjunctivae are normal. Pupils are equal, round, and reactive to light.  Neck: Normal range of motion. Neck supple.  Cardiovascular: Normal rate and regular rhythm.   Pulmonary/Chest: Effort normal. She has rales.  Abdominal: Soft. Bowel sounds are normal.  Neurological: She is alert and oriented to person, place, and time.          Assessment & Plan:  Stable  Bowels with the new medications and glycerine suppositorys Still needs occasional laxitive  Possible increase of linzess to 290 plus occasional 145

## 2013-06-25 ENCOUNTER — Encounter: Payer: Self-pay | Admitting: Internal Medicine

## 2013-06-28 ENCOUNTER — Other Ambulatory Visit: Payer: Self-pay | Admitting: *Deleted

## 2013-06-28 MED ORDER — LINACLOTIDE 145 MCG PO CAPS: 145.0000 ug | ORAL_CAPSULE | Freq: Every day | ORAL | Status: DC

## 2013-06-28 MED ORDER — LINACLOTIDE 290 MCG PO CAPS: 290.0000 ug | ORAL_CAPSULE | Freq: Every day | ORAL | Status: DC

## 2013-06-30 DIAGNOSIS — H40019 Open angle with borderline findings, low risk, unspecified eye: Secondary | ICD-10-CM | POA: Diagnosis not present

## 2013-06-30 DIAGNOSIS — Z961 Presence of intraocular lens: Secondary | ICD-10-CM | POA: Diagnosis not present

## 2013-06-30 DIAGNOSIS — H353190 Nonexudative age-related macular degeneration, unspecified eye, stage unspecified: Secondary | ICD-10-CM | POA: Diagnosis not present

## 2013-06-30 DIAGNOSIS — H04129 Dry eye syndrome of unspecified lacrimal gland: Secondary | ICD-10-CM | POA: Diagnosis not present

## 2013-07-02 ENCOUNTER — Telehealth: Payer: Self-pay | Admitting: Internal Medicine

## 2013-07-02 MED ORDER — LINACLOTIDE 290 MCG PO CAPS: 290.0000 ug | ORAL_CAPSULE | Freq: Every day | ORAL | Status: DC

## 2013-07-02 MED ORDER — LINACLOTIDE 145 MCG PO CAPS: 145.0000 ug | ORAL_CAPSULE | Freq: Every day | ORAL | Status: DC

## 2013-07-02 NOTE — Telephone Encounter (Signed)
[facility] wants a 90 day refill of Linaclotide (LINZESS) 145 MCG CAPS instead of the already prescribed 30 day refill. Pt is at the pharmacy now.  Please advise.  Thanks a bunch  [employer]

## 2013-07-02 NOTE — Telephone Encounter (Signed)
Sent in

## 2013-07-06 DIAGNOSIS — Z23 Encounter for immunization: Secondary | ICD-10-CM | POA: Diagnosis not present

## 2013-08-09 DIAGNOSIS — Z961 Presence of intraocular lens: Secondary | ICD-10-CM | POA: Diagnosis not present

## 2013-08-09 DIAGNOSIS — H18419 Arcus senilis, unspecified eye: Secondary | ICD-10-CM | POA: Diagnosis not present

## 2013-08-09 DIAGNOSIS — H3530 Unspecified macular degeneration: Secondary | ICD-10-CM | POA: Diagnosis not present

## 2013-09-01 ENCOUNTER — Other Ambulatory Visit: Payer: Self-pay | Admitting: Internal Medicine

## 2013-11-09 DIAGNOSIS — D485 Neoplasm of uncertain behavior of skin: Secondary | ICD-10-CM | POA: Diagnosis not present

## 2013-11-10 DIAGNOSIS — L821 Other seborrheic keratosis: Secondary | ICD-10-CM | POA: Diagnosis not present

## 2013-11-23 DIAGNOSIS — H35369 Drusen (degenerative) of macula, unspecified eye: Secondary | ICD-10-CM | POA: Diagnosis not present

## 2013-11-23 DIAGNOSIS — H35419 Lattice degeneration of retina, unspecified eye: Secondary | ICD-10-CM | POA: Diagnosis not present

## 2013-11-23 DIAGNOSIS — H35379 Puckering of macula, unspecified eye: Secondary | ICD-10-CM | POA: Diagnosis not present

## 2013-11-23 DIAGNOSIS — H33109 Unspecified retinoschisis, unspecified eye: Secondary | ICD-10-CM | POA: Diagnosis not present

## 2013-12-07 DIAGNOSIS — Z1231 Encounter for screening mammogram for malignant neoplasm of breast: Secondary | ICD-10-CM | POA: Diagnosis not present

## 2013-12-07 DIAGNOSIS — Z803 Family history of malignant neoplasm of breast: Secondary | ICD-10-CM | POA: Diagnosis not present

## 2013-12-07 DIAGNOSIS — M949 Disorder of cartilage, unspecified: Secondary | ICD-10-CM | POA: Diagnosis not present

## 2013-12-07 DIAGNOSIS — M899 Disorder of bone, unspecified: Secondary | ICD-10-CM | POA: Diagnosis not present

## 2013-12-13 ENCOUNTER — Encounter: Payer: Self-pay | Admitting: Internal Medicine

## 2013-12-13 MED ORDER — FLUTICASONE PROPIONATE 50 MCG/ACT NA SUSP: NASAL | Status: DC

## 2013-12-13 MED ORDER — PREGABALIN 75 MG PO CAPS: 75.0000 mg | ORAL_CAPSULE | Freq: Two times a day (BID) | ORAL | Status: DC

## 2013-12-13 MED ORDER — DULOXETINE HCL 60 MG PO CPEP: 60.0000 mg | ORAL_CAPSULE | Freq: Every day | ORAL | Status: DC

## 2013-12-13 NOTE — Telephone Encounter (Signed)
Pt following up on med request.  New pharm: [facility]. See above

## 2014-01-06 ENCOUNTER — Encounter: Payer: Self-pay | Admitting: Internal Medicine

## 2014-01-10 DIAGNOSIS — L219 Seborrheic dermatitis, unspecified: Secondary | ICD-10-CM | POA: Diagnosis not present

## 2014-01-10 DIAGNOSIS — D239 Other benign neoplasm of skin, unspecified: Secondary | ICD-10-CM | POA: Diagnosis not present

## 2014-01-10 DIAGNOSIS — Z85828 Personal history of other malignant neoplasm of skin: Secondary | ICD-10-CM | POA: Diagnosis not present

## 2014-01-10 DIAGNOSIS — L719 Rosacea, unspecified: Secondary | ICD-10-CM | POA: Diagnosis not present

## 2014-01-10 DIAGNOSIS — L821 Other seborrheic keratosis: Secondary | ICD-10-CM | POA: Diagnosis not present

## 2014-01-10 DIAGNOSIS — Z411 Encounter for cosmetic surgery: Secondary | ICD-10-CM | POA: Diagnosis not present

## 2014-01-19 DIAGNOSIS — N760 Acute vaginitis: Secondary | ICD-10-CM | POA: Diagnosis not present

## 2014-01-19 DIAGNOSIS — N951 Menopausal and female climacteric states: Secondary | ICD-10-CM | POA: Diagnosis not present

## 2014-01-24 ENCOUNTER — Ambulatory Visit (INDEPENDENT_AMBULATORY_CARE_PROVIDER_SITE_OTHER): Payer: Medicare Other | Admitting: Internal Medicine

## 2014-01-24 ENCOUNTER — Encounter: Payer: Self-pay | Admitting: Internal Medicine

## 2014-01-24 VITALS — BP 120/82 | HR 52 | Temp 97.9°F | Ht 62.5 in | Wt 137.0 lb

## 2014-01-24 DIAGNOSIS — M48061 Spinal stenosis, lumbar region without neurogenic claudication: Secondary | ICD-10-CM

## 2014-01-24 DIAGNOSIS — Z0000 Encounter for general adult medical examination without abnormal findings: Secondary | ICD-10-CM | POA: Diagnosis not present

## 2014-01-24 DIAGNOSIS — T887XXA Unspecified adverse effect of drug or medicament, initial encounter: Secondary | ICD-10-CM | POA: Diagnosis not present

## 2014-01-24 DIAGNOSIS — K5909 Other constipation: Secondary | ICD-10-CM

## 2014-01-24 DIAGNOSIS — M810 Age-related osteoporosis without current pathological fracture: Secondary | ICD-10-CM

## 2014-01-24 DIAGNOSIS — J309 Allergic rhinitis, unspecified: Secondary | ICD-10-CM

## 2014-01-24 DIAGNOSIS — K5730 Diverticulosis of large intestine without perforation or abscess without bleeding: Secondary | ICD-10-CM | POA: Diagnosis not present

## 2014-01-24 DIAGNOSIS — E785 Hyperlipidemia, unspecified: Secondary | ICD-10-CM

## 2014-01-24 LAB — CBC WITH DIFFERENTIAL/PLATELET
BASOS ABS: 0.1 10*3/uL (ref 0.0–0.1)
Basophils Relative: 1.2 % (ref 0.0–3.0)
Eosinophils Absolute: 0.2 10*3/uL (ref 0.0–0.7)
Eosinophils Relative: 3.3 % (ref 0.0–5.0)
HCT: 41.3 % (ref 36.0–46.0)
HEMOGLOBIN: 13.6 g/dL (ref 12.0–15.0)
LYMPHS PCT: 37.5 % (ref 12.0–46.0)
Lymphs Abs: 1.8 10*3/uL (ref 0.7–4.0)
MCHC: 33.1 g/dL (ref 30.0–36.0)
MCV: 92.3 fl (ref 78.0–100.0)
MONOS PCT: 9.5 % (ref 3.0–12.0)
Monocytes Absolute: 0.4 10*3/uL (ref 0.1–1.0)
NEUTROS ABS: 2.3 10*3/uL (ref 1.4–7.7)
Neutrophils Relative %: 48.5 % (ref 43.0–77.0)
Platelets: 136 10*3/uL — ABNORMAL LOW (ref 150.0–400.0)
RBC: 4.47 Mil/uL (ref 3.87–5.11)
RDW: 14.2 % (ref 11.5–14.6)
WBC: 4.7 10*3/uL (ref 4.5–10.5)

## 2014-01-24 LAB — POCT URINALYSIS DIPSTICK
BILIRUBIN UA: NEGATIVE
GLUCOSE UA: NEGATIVE
Ketones, UA: NEGATIVE
LEUKOCYTES UA: NEGATIVE
NITRITE UA: NEGATIVE
Protein, UA: NEGATIVE
RBC UA: NEGATIVE
Spec Grav, UA: 1.015
Urobilinogen, UA: 0.2
pH, UA: 8

## 2014-01-24 LAB — BASIC METABOLIC PANEL
BUN: 18 mg/dL (ref 6–23)
CALCIUM: 9.1 mg/dL (ref 8.4–10.5)
CHLORIDE: 101 meq/L (ref 96–112)
CO2: 29 meq/L (ref 19–32)
Creatinine, Ser: 0.7 mg/dL (ref 0.4–1.2)
GFR: 91.41 mL/min (ref >60.00)
Glucose, Bld: 70 mg/dL (ref 70–99)
POTASSIUM: 4.3 meq/L (ref 3.5–5.1)
SODIUM: 138 meq/L (ref 135–145)

## 2014-01-24 LAB — LIPID PANEL
CHOL/HDL RATIO: 2
Cholesterol: 156 mg/dL (ref 0–200)
HDL: 82.7 mg/dL (ref >39.00)
LDL Cholesterol: 67 mg/dL (ref 0–99)
Triglycerides: 34 mg/dL (ref 0.0–149.0)
VLDL: 6.8 mg/dL (ref 0.0–40.0)

## 2014-01-24 LAB — HEPATIC FUNCTION PANEL
ALK PHOS: 50 U/L (ref 39–117)
ALT: 14 U/L (ref 0–35)
AST: 16 U/L (ref 0–37)
Albumin: 4.1 g/dL (ref 3.5–5.2)
BILIRUBIN DIRECT: 0.1 mg/dL (ref 0.0–0.3)
TOTAL PROTEIN: 6.3 g/dL (ref 6.0–8.3)
Total Bilirubin: 1 mg/dL (ref 0.3–1.2)

## 2014-01-24 LAB — CALCIUM: CALCIUM: 9.1 mg/dL (ref 8.4–10.5)

## 2014-01-24 LAB — TSH: TSH: 2.5 u[IU]/mL (ref 0.35–5.50)

## 2014-01-24 NOTE — Progress Notes (Signed)
Subjective:    Patient ID: Tanya Leon, female    DOB: July 03, 1941, 73 y.o.   MRN: [id number]  HPI  Prevent for annual wellness exam with labs today chronic conditions of  neuropathic pain and drugs to control pain with drug induced constipation. Also noted allergies. Mild lipids and osteoporosis  And reviewed bone density for [facility].   Review of Systems  Constitutional: Negative for activity change, appetite change and fatigue.  HENT: Negative for congestion, ear pain, postnasal drip and sinus pressure.   Eyes: Negative for redness and visual disturbance.  Respiratory: Negative for cough, shortness of breath and wheezing.   Gastrointestinal: Positive for constipation. Negative for abdominal pain and abdominal distention.  Genitourinary: Negative for dysuria, frequency and menstrual problem.  Musculoskeletal: Positive for back pain. Negative for arthralgias, joint swelling, myalgias and neck pain.  Skin: Negative for rash and wound.  Neurological: Negative for dizziness, weakness and headaches.  Hematological: Negative for adenopathy. Does not bruise/bleed easily.  Psychiatric/Behavioral: Negative for sleep disturbance and decreased concentration.   Past Medical History  Diagnosis Date  . Osteoporosis   . Constipation   . Low back pain   . Lumbar spondylolysis   . Arthritis   . Depression   . Diverticulosis     History   Social History  . Marital Status: Married    Spouse Name: N/A    Number of Children: N/A  . Years of Education: N/A   Occupational History  . cpa    Social History Main Topics  . Smoking status: Never Smoker   . Smokeless tobacco: Not on file  . Alcohol Use: Yes  . Drug Use: No  . Sexual Activity: Yes   Other Topics Concern  . Not on file   Social History Narrative  . No narrative on file    Past Surgical History  Procedure Laterality Date  . Tonsillectomly    . Abdominal hysterectomy    . Perforated deverticular abscess/laporotomy and  colostomy    . Take down of colostomy    . Back fu sion    . Joint replacement    . Hernia repair  05/28/11    Family History  Problem Relation Age of Onset  . Alzheimer's disease    . Heart disease Father   . Cancer Mother     No Known Allergies  Current Outpatient Prescriptions on File Prior to Visit  Medication Sig Dispense Refill  . aspirin-acetaminophen-caffeine (EXCEDRIN EXTRA STRENGTH) 250-250-65 MG per tablet Take 2 tablets by mouth every 6 (six) hours as needed.       . bisacodyl (BISACODYL) 5 MG EC tablet Take 10 mg by mouth 2 (two) times daily as needed.       . calcium-vitamin D (OSCAL WITH D 500-200) 500-200 MG-UNIT per tablet Take 1 tablet by mouth 3 (three) times daily.        . Cholecalciferol (VITAMIN D) 1000 UNITS capsule Take 2,000 Units by mouth daily.       . Ciclopirox 1 % shampoo       . DULoxetine (CYMBALTA) 60 MG capsule Take 1 capsule (60 mg total) by mouth daily.  90 capsule  1  . estradiol (VIVELLE-DOT) 0.05 MG/24HR Place 1 patch onto the skin. 2 times a week       . fluticasone (FLONASE) 50 MCG/ACT nasal spray Use 2 sprays in each  nostril daily  48 g  3  . ketoconazole (NIZORAL) 2 % shampoo       .  Linaclotide 290 MCG CAPS Take 1 capsule by mouth daily.  30 capsule  11  . Multiple Vitamins-Minerals (PRESERVISION/LUTEIN) CAPS Take by mouth daily.        . Olopatadine HCl (PATADAY) 0.2 % SOLN Apply to eye.      . omega-3 acid ethyl esters (LOVAZA) 1 G capsule Take 2 g by mouth daily.        . pregabalin (LYRICA) 75 MG capsule Take 1 capsule (75 mg total) by mouth 2 (two) times daily.  180 capsule  1  . Probiotic Product (ALIGN) 4 MG CAPS Take by mouth daily.        . RESTASIS 0.05 % ophthalmic emulsion       . sennosides-docusate sodium (SENOKOT-S) 8.6-50 MG tablet Take 2 tablets by mouth as needed.       Marland Kitchen tiZANidine (ZANAFLEX) 2 MG tablet Take 1 tablet (2 mg total) by mouth every 6 (six) hours as needed.  180 tablet  3  . zolpidem (AMBIEN) 5 MG tablet  Take 1 tablet (5 mg total) by mouth at bedtime as needed.  90 tablet  1   No current facility-administered medications on file prior to visit.    BP 120/82  Pulse 52  Temp(Src) 97.9 F (36.6 C) (Oral)  Ht 5' 2.5" (1.588 m)  Wt 137 lb (62.143 kg)  BMI 24.64 kg/m2       Objective:   Physical Exam  Constitutional: She appears well-developed and well-nourished.  HENT:  Head: Normocephalic and atraumatic.  Eyes: Conjunctivae are normal. Pupils are equal, round, and reactive to light.  Neck: Neck supple.  Cardiovascular: Normal rate and regular rhythm.   Pulmonary/Chest: Effort normal and breath sounds normal.  Abdominal: Soft.  Bowel sound decreased  Musculoskeletal: Normal range of motion.          Assessment & Plan:  Stable pain management Control of drug-related constipation with medications Osteoporosis risk noted on bone density this year. Measuring of calcium and vitamin D with action plan discussed with patient History of mild to moderate hyperlipidemia monitoring today with appropriate intervention if LDL is greater than 100 Subjective:    Tanya Leon is a 73 y.o. female who presents for Medicare Annual/Subsequent preventive examination.  Preventive Screening-Counseling & Management  Tobacco History  Smoking status  . Never Smoker   Smokeless tobacco  . Not on file     Problems Prior to Visit 1.   Current Problems (verified) Patient Active Problem List   Diagnosis Date Noted  . Status post right partial knee replacement 12/13/2011  . 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 COLONIC POLYPS 01/13/2009  . DIVERTICULOSIS, COLON 01/13/2009  . 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 CONSTIPATION, DRUG INDUCED 04/21/2008  . SPINAL STENOSIS, LUMBAR 01/25/2008  . INSOMNIA, CHRONIC 07/22/2007  . ALLERGIC RHINITIS 05/21/2007  . OSTEOPOROSIS 02/26/2007    Medications Prior to Visit Current Outpatient Prescriptions on File Prior  to Visit  Medication Sig Dispense Refill  . aspirin-acetaminophen-caffeine (EXCEDRIN EXTRA STRENGTH) 250-250-65 MG per tablet Take 2 tablets by mouth every 6 (six) hours as needed.       . bisacodyl (BISACODYL) 5 MG EC tablet Take 10 mg by mouth 2 (two) times daily as needed.       . calcium-vitamin D (OSCAL WITH D 500-200) 500-200 MG-UNIT per tablet Take 1 tablet by mouth 3 (three) times daily.        . Cholecalciferol (VITAMIN D) 1000 UNITS capsule Take  2,000 Units by mouth daily.       . Ciclopirox 1 % shampoo       . DULoxetine (CYMBALTA) 60 MG capsule Take 1 capsule (60 mg total) by mouth daily.  90 capsule  1  . estradiol (VIVELLE-DOT) 0.05 MG/24HR Place 1 patch onto the skin. 2 times a week       . fluticasone (FLONASE) 50 MCG/ACT nasal spray Use 2 sprays in each  nostril daily  48 g  3  . ketoconazole (NIZORAL) 2 % shampoo       . Linaclotide 290 MCG CAPS Take 1 capsule by mouth daily.  30 capsule  11  . Multiple Vitamins-Minerals (PRESERVISION/LUTEIN) CAPS Take by mouth daily.        . Olopatadine HCl (PATADAY) 0.2 % SOLN Apply to eye.      . omega-3 acid ethyl esters (LOVAZA) 1 G capsule Take 2 g by mouth daily.        . pregabalin (LYRICA) 75 MG capsule Take 1 capsule (75 mg total) by mouth 2 (two) times daily.  180 capsule  1  . Probiotic Product (ALIGN) 4 MG CAPS Take by mouth daily.        . RESTASIS 0.05 % ophthalmic emulsion       . sennosides-docusate sodium (SENOKOT-S) 8.6-50 MG tablet Take 2 tablets by mouth as needed.       Marland Kitchen tiZANidine (ZANAFLEX) 2 MG tablet Take 1 tablet (2 mg total) by mouth every 6 (six) hours as needed.  180 tablet  3  . zolpidem (AMBIEN) 5 MG tablet Take 1 tablet (5 mg total) by mouth at bedtime as needed.  90 tablet  1   No current facility-administered medications on file prior to visit.    Current Medications (verified) Current Outpatient Prescriptions  Medication Sig Dispense Refill  . aspirin-acetaminophen-caffeine (EXCEDRIN EXTRA STRENGTH)  250-250-65 MG per tablet Take 2 tablets by mouth every 6 (six) hours as needed.       . bisacodyl (BISACODYL) 5 MG EC tablet Take 10 mg by mouth 2 (two) times daily as needed.       . calcium-vitamin D (OSCAL WITH D 500-200) 500-200 MG-UNIT per tablet Take 1 tablet by mouth 3 (three) times daily.        . Cholecalciferol (VITAMIN D) 1000 UNITS capsule Take 2,000 Units by mouth daily.       . Ciclopirox 1 % shampoo       . DULoxetine (CYMBALTA) 60 MG capsule Take 1 capsule (60 mg total) by mouth daily.  90 capsule  1  . estradiol (VIVELLE-DOT) 0.05 MG/24HR Place 1 patch onto the skin. 2 times a week       . fluticasone (FLONASE) 50 MCG/ACT nasal spray Use 2 sprays in each  nostril daily  48 g  3  . ketoconazole (NIZORAL) 2 % shampoo       . Linaclotide 290 MCG CAPS Take 1 capsule by mouth daily.  30 capsule  11  . Multiple Vitamins-Minerals (PRESERVISION/LUTEIN) CAPS Take by mouth daily.        . naproxen sodium (ALEVE) 220 MG tablet Take 220 mg by mouth 2 (two) times daily with a meal.      . Olopatadine HCl (PATADAY) 0.2 % SOLN Apply to eye.      . omega-3 acid ethyl esters (LOVAZA) 1 G capsule Take 2 g by mouth daily.        . pregabalin (LYRICA) 75 MG capsule Take  1 capsule (75 mg total) by mouth 2 (two) times daily.  180 capsule  1  . Probiotic Product (ALIGN) 4 MG CAPS Take by mouth daily.        . RESTASIS 0.05 % ophthalmic emulsion       . sennosides-docusate sodium (SENOKOT-S) 8.6-50 MG tablet Take 2 tablets by mouth as needed.       Marland Kitchen tiZANidine (ZANAFLEX) 2 MG tablet Take 1 tablet (2 mg total) by mouth every 6 (six) hours as needed.  180 tablet  3  . zolpidem (AMBIEN) 5 MG tablet Take 1 tablet (5 mg total) by mouth at bedtime as needed.  90 tablet  1   No current facility-administered medications for this visit.     Allergies (verified) Review of patient's allergies indicates no known allergies.   PAST HISTORY  Family History Family History  Problem Relation Age of Onset  .  Alzheimer's disease    . Heart disease Father   . Cancer Mother     Social History History  Substance Use Topics  . Smoking status: Never Smoker   . Smokeless tobacco: Not on file  . Alcohol Use: Yes     Are there smokers in your home (other than you)? No  Risk Factors Current exercise habits: Gym/ health club routine includes cardio, treadmill and walking on track .  Dietary issues discussed: none   Cardiac risk factors: advanced age (older than 70 for men, 73 for women).  Depression Screen (Note: if answer to either of the following is "Yes", a more complete depression screening is indicated)   Over the past two weeks, have you felt down, depressed or hopeless? No  Over the past two weeks, have you felt little interest or pleasure in doing things? No  Have you lost interest or pleasure in daily life? No  Do you often feel hopeless? No  Do you cry easily over simple problems? No  Activities of Daily Living In your present state of health, do you have any difficulty performing the following activities?:  Driving? No Managing money?  No Feeding yourself? No Getting from bed to chair? No Climbing a flight of stairs? No Preparing food and eating?: No Bathing or showering? No Getting dressed: No Getting to the toilet? No Using the toilet:No Moving around from place to place: No In the past year have you fallen or had a near fall?:No   Are you sexually active?  Yes  Do you have more than one partner?  No  Hearing Difficulties: No Do you often ask people to speak up or repeat themselves? No Do you experience ringing or noises in your ears? No Do you have difficulty understanding soft or whispered voices? No   Do you feel that you have a problem with memory? No  Do you often misplace items? No  Do you feel safe at home?  No  Cognitive Testing  Alert? Yes  Normal Appearance?Yes  Oriented to person? Yes  Place? Yes   Time? Yes  Recall of three objects?  Yes  Can  perform simple calculations? Yes  Displays appropriate judgment?Yes  Can read the correct time from a watch face?Yes   Advanced Directives have been discussed with the patient? Yes  List the Names of Other Physician/Practitioners you currently use: 1.    Indicate any recent Medical Services you may have received from other than [facility] providers in the past year (date may be approximate).  Immunization History  Administered Date(s) Administered  .  H1N1 09/13/2008  . Influenza Whole 06/24/2008, 06/26/2009, 08/01/2010  . Influenza, Seasonal, Injecte, Preservative Fre 07/06/2013  . Pneumococcal Polysaccharide-23 12/30/2005  . Td 08/01/2010  . Zoster 10/08/2007    Screening Tests Health Maintenance  Topic Date Due  . Influenza Vaccine  04/30/2014  . Mammogram  10/07/2014  . Colonoscopy  01/27/2019  . Tetanus/tdap  08/01/2020  . Pneumococcal Polysaccharide Vaccine Age 9 And Over  Completed  . Zostavax  Completed    All answers were reviewed with the patient and necessary referrals were made:  Georgetta Haber, MD   01/24/2014   History reviewed: allergies, current medications, past family history, past medical history, past social history, past surgical history and problem list  Review of Systems Pertinent items are noted in HPI.    Objective:     Vision by Snellen chart: right eye:20/20, left eye:20/20  Body mass index is 24.64 kg/(m^2). BP 120/82  Pulse 52  Temp(Src) 97.9 F (36.6 C) (Oral)  Ht 5' 2.5" (1.588 m)  Wt 137 lb (62.143 kg)  BMI 24.64 kg/m2 Exam per problem focused section     Assessment:      This is a routine wellness  examination for this patient . I reviewed all health maintenance protocols including mammography, colonoscopy, bone density Needed referrals were placed. Age and diagnosis  appropriate screening labs were ordered. Her immunization history was reviewed and appropriate vaccinations were ordered. Her current medications and allergies  were reviewed and needed refills of her chronic medications were ordered. The plan for yearly health maintenance was discussed all orders and referrals were made as appropriate.      Plan:     During the course of the visit the patient was educated and counseled about appropriate screening and preventive services including:    Influenza vaccine  Bone densitometry screening  Colorectal cancer screening  Diet review for nutrition referral? Yes ____  Not Indicated ____   Patient Instructions (the written plan) was given to the patient.  Medicare Attestation I have personally reviewed: The patient's medical and social history Their use of alcohol, tobacco or illicit drugs Their current medications and supplements The patient's functional ability including ADLs,fall risks, home safety risks, cognitive, and hearing and visual impairment Diet and physical activities Evidence for depression or mood disorders  The patient's weight, height, BMI, and visual acuity have been recorded in the chart.  I have made referrals, counseling, and provided education to the patient based on review of the above and I have provided the patient with a written personalized care plan for preventive services.     Georgetta Haber, MD   01/24/2014

## 2014-01-24 NOTE — Patient Instructions (Signed)
The patient is instructed to continue all medications as prescribed. Schedule followup with check out clerk upon leaving the clinic  

## 2014-01-25 LAB — VITAMIN D 25 HYDROXY (VIT D DEFICIENCY, FRACTURES): VIT D 25 HYDROXY: 62 ng/mL (ref 30–89)

## 2014-03-02 ENCOUNTER — Ambulatory Visit: Payer: Medicare Other | Admitting: Internal Medicine

## 2014-03-15 ENCOUNTER — Telehealth: Payer: Self-pay | Admitting: Internal Medicine

## 2014-03-15 NOTE — Telephone Encounter (Signed)
lmovm to c/b and schedule an appt with Dr Yong Channel in September

## 2014-03-22 NOTE — Telephone Encounter (Signed)
Pt has been scheduled with [facility]

## 2014-04-19 DIAGNOSIS — H40019 Open angle with borderline findings, low risk, unspecified eye: Secondary | ICD-10-CM | POA: Diagnosis not present

## 2014-04-21 DIAGNOSIS — H353190 Nonexudative age-related macular degeneration, unspecified eye, stage unspecified: Secondary | ICD-10-CM | POA: Diagnosis not present

## 2014-04-21 DIAGNOSIS — H4011X0 Primary open-angle glaucoma, stage unspecified: Secondary | ICD-10-CM | POA: Diagnosis not present

## 2014-04-21 DIAGNOSIS — H409 Unspecified glaucoma: Secondary | ICD-10-CM | POA: Diagnosis not present

## 2014-05-03 ENCOUNTER — Ambulatory Visit (INDEPENDENT_AMBULATORY_CARE_PROVIDER_SITE_OTHER): Payer: Medicare Other | Admitting: Internal Medicine

## 2014-05-03 ENCOUNTER — Encounter: Payer: Self-pay | Admitting: Internal Medicine

## 2014-05-03 VITALS — BP 130/60 | HR 68 | Ht 61.0 in | Wt 137.0 lb

## 2014-05-03 DIAGNOSIS — R1031 Right lower quadrant pain: Secondary | ICD-10-CM | POA: Diagnosis not present

## 2014-05-03 DIAGNOSIS — Z86010 Personal history of colonic polyps: Secondary | ICD-10-CM

## 2014-05-03 DIAGNOSIS — K5900 Constipation, unspecified: Secondary | ICD-10-CM | POA: Diagnosis not present

## 2014-05-03 NOTE — Patient Instructions (Signed)

## 2014-05-03 NOTE — Progress Notes (Signed)
HISTORY OF PRESENT ILLNESS:  Tanya Leon is a 73 y.o. female with past medical history as listed below who presents today regarding surveillance colonoscopy as well as multiple questions and abdominal complaints. Previous patient of Dr. Lyla Son. Prior history of perforated diverticulitis requiring surgical resection. I performed her last colonoscopy in April 2010. She was found to have postoperative changes, sigmoid diverticulosis, and a diminutive cecal adenoma which was removed. Routine followup in 5 years recommended. Recall letter sent and received. Patient presents today with a list of typed out "concerns". First, she is questions regarding perforation risk in elderly patients and those having had prior segmental colectomy. Next, reports 3 episodes of sharp stabbing right lower quadrant discomfort in the past 3 months. The discomfort can last up to a day with residual soreness. Seems to be relieved with activity and positional change. No fevers. No problems for at least a month. Next, questions regarding rectocele. Deferred to her gynecologist. Finally questions regarding bowel habits. She uses MiraLax daily. She describes soft daily bowel movements.  REVIEW OF SYSTEMS:  All non-GI ROS negative except for arthritis, heart murmur  Past Medical History  Diagnosis Date  . Osteoporosis   . Constipation   . Low back pain   . Lumbar spondylolysis   . Arthritis   . Depression   . Diverticulosis   . Colon polyps     adenomatous  . Heart murmur     benign    Past Surgical History  Procedure Laterality Date  . Tonsillectomly    . Abdominal hysterectomy      with ovaries  . Perforated deverticular abscess/laporotomy and colostomy    . Take down of colostomy    . Lumbar fusion      L4-L5  . Abdominal hernia repair Right     incisional  . Incisional ab  05/28/11  . Partial knee arthroplasty Right   . Eye muscle surgery    . Partial knee arthroplasty Left   . Stribismus eye surgery  Right     Social History Tanya Leon  reports that she has never smoked. She has never used smokeless tobacco. She reports that she drinks alcohol. She reports that she does not use illicit drugs.  family history includes Alzheimer's disease in her father; Breast cancer in her mother; Colon polyps in her mother; Diabetes in her mother; Heart attack in her mother; Heart disease in her father; Heart disease (age of onset: 3) in her mother; Prostate cancer in her brother.  No Known Allergies     PHYSICAL EXAMINATION: Vital signs: BP 130/60  Pulse 68  Ht 5\' 1"  (1.549 m)  Wt 137 lb (62.143 kg)  BMI 25.90 kg/m2  Constitutional: generally well-appearing, no acute distress Psychiatric: alert and oriented x3, cooperative Eyes: extraocular movements intact, anicteric, conjunctiva pink Mouth: oral pharynx moist, no lesions Neck: supple no lymphadenopathy Cardiovascular: heart regular rate and rhythm, no murmur Lungs: clear to auscultation bilaterally Abdomen: soft, nontender, nondistended, no obvious ascites, no peritoneal signs, normal bowel sounds, no organomegaly. Multiple prior surgical incisions well-healed Rectal: Deferred until colonoscopy Extremities: no lower extremity edema bilaterally Skin: no lesions on visible extremities Neuro: No focal deficits.   ASSESSMENT:  #1. History of adenomatous colon polyps. Due for surveillance #2. Fleeting vague right lower quadrant discomfort of uncertain cause. No issues for one month. Observe #3. History of perforated diverticulitis status post sigmoid colectomy #4. Chronic constipation  PLAN:  #1. Surveillance colonoscopy.The nature of the procedure, as well  as the risks, benefits, and alternatives were carefully and thoroughly reviewed with the patient. Ample time for discussion and questions allowed. The patient understood, was satisfied, and agreed to proceed. Movi prep prescribed. The patient instructed on its use #2. Observation  regarding history of right lower quadrant discomfort. Since no worrisome features and no problems for greater than one month, observe. Return if needed to PCP or GI #3. Continue MiraLax to maintain regular bowel habits

## 2014-05-12 ENCOUNTER — Telehealth: Payer: Self-pay | Admitting: Internal Medicine

## 2014-05-12 DIAGNOSIS — H4011X0 Primary open-angle glaucoma, stage unspecified: Secondary | ICD-10-CM | POA: Diagnosis not present

## 2014-05-12 DIAGNOSIS — H409 Unspecified glaucoma: Secondary | ICD-10-CM | POA: Diagnosis not present

## 2014-05-12 HISTORY — DX: Unspecified glaucoma: H40.9

## 2014-05-12 HISTORY — PX: GLAUCOMA SURGERY: SHX656

## 2014-05-12 NOTE — Telephone Encounter (Signed)
[place] [place] is requesting re-fill on DULoxetine (CYMBALTA) 60 MG capsule

## 2014-05-12 NOTE — Telephone Encounter (Signed)
Also add pregabalin (LYRICA) 75 MG capsule

## 2014-05-16 ENCOUNTER — Encounter: Payer: Medicare Other | Admitting: Internal Medicine

## 2014-05-16 NOTE — Telephone Encounter (Signed)
Received a 2nd re-fill request for the below medications.

## 2014-05-16 NOTE — Telephone Encounter (Signed)
Is this ok to refill?  

## 2014-05-16 NOTE — Telephone Encounter (Signed)
Pt scheduled with Dr. Yong Channel on 06/01/14

## 2014-05-16 NOTE — Telephone Encounter (Signed)
Yes

## 2014-05-17 ENCOUNTER — Telehealth: Payer: Self-pay

## 2014-05-17 MED ORDER — DULOXETINE HCL 60 MG PO CPEP: 60.0000 mg | ORAL_CAPSULE | Freq: Every day | ORAL | Status: DC

## 2014-05-17 MED ORDER — PREGABALIN 75 MG PO CAPS: 75.0000 mg | ORAL_CAPSULE | Freq: Two times a day (BID) | ORAL | Status: DC

## 2014-05-17 NOTE — Telephone Encounter (Signed)
Medications called in to [facility] home delivery

## 2014-05-17 NOTE — Telephone Encounter (Signed)
error 

## 2014-05-26 ENCOUNTER — Other Ambulatory Visit: Payer: Self-pay | Admitting: Dermatology

## 2014-05-26 DIAGNOSIS — Z411 Encounter for cosmetic surgery: Secondary | ICD-10-CM | POA: Diagnosis not present

## 2014-05-26 DIAGNOSIS — C44319 Basal cell carcinoma of skin of other parts of face: Secondary | ICD-10-CM | POA: Diagnosis not present

## 2014-05-26 DIAGNOSIS — D485 Neoplasm of uncertain behavior of skin: Secondary | ICD-10-CM | POA: Diagnosis not present

## 2014-05-27 ENCOUNTER — Ambulatory Visit (AMBULATORY_SURGERY_CENTER): Payer: Medicare Other | Admitting: Internal Medicine

## 2014-05-27 ENCOUNTER — Encounter: Payer: Self-pay | Admitting: Internal Medicine

## 2014-05-27 VITALS — BP 124/73 | HR 59 | Temp 97.6°F | Resp 21 | Ht 61.0 in | Wt 131.0 lb

## 2014-05-27 DIAGNOSIS — D126 Benign neoplasm of colon, unspecified: Secondary | ICD-10-CM

## 2014-05-27 DIAGNOSIS — Z860100 Personal history of colon polyps, unspecified: Secondary | ICD-10-CM

## 2014-05-27 DIAGNOSIS — R109 Unspecified abdominal pain: Secondary | ICD-10-CM | POA: Diagnosis not present

## 2014-05-27 MED ORDER — SODIUM CHLORIDE 0.9 % IV SOLN: 500.0000 mL | INTRAVENOUS | Status: DC

## 2014-05-27 NOTE — Progress Notes (Signed)
Procedure ends, to recovery, report given and VSS. 

## 2014-05-27 NOTE — Op Note (Signed)
[place]  [address]. [place], [id number]   COLONOSCOPY PROCEDURE REPORT  PATIENT: Tanya, Leon  MR#: [id number] BIRTHDATE: 1941-05-24 , 73  yrs. old GENDER: Female ENDOSCOPIST: Eustace Quail, MD REFERRED [id number] Program Recall PROCEDURE DATE:  05/27/2014 PROCEDURE:   Colonoscopy with snare polypectomy x 5 First Screening Colonoscopy - Avg.  risk and is 50 yrs.  old or older - No.  Prior Negative Screening - Now for repeat screening. N/A  History of Adenoma - Now for follow-up colonoscopy & has been > or = to 3 yrs.  Yes hx of adenoma.  Has been 3 or more years since last colonoscopy.  Polyps Removed Today? Yes. ASA CLASS:   Class II INDICATIONS:Patient's personal history of adenomatous colon polyps. Multiple prior exams. Last examination April 2010 with diminutive cecal adenoma. MEDICATIONS: MAC sedation, administered by CRNA and propofol (Diprivan) 300mg  IV  DESCRIPTION OF PROCEDURE:   After the risks benefits and alternatives of the procedure were thoroughly explained, informed consent was obtained.  A digital rectal exam revealed no abnormalities of the rectum.   The LB [id number]  endoscope was introduced through the anus and advanced to the cecum, which was identified by both the appendix and ileocecal valve. No adverse events experienced.   The quality of the prep was good, using MoviPrep  The instrument was then slowly withdrawn as the colon was fully examined.      COLON FINDINGS: The mucosa appeared normal in the terminal ileum. Five sessile polyps ranging between 5-36mm in size were found at the cecum, ileocecal valve, in the ascending colon (2), and transverse colon.  A polypectomy was performed with a cold snare.  The resection was complete and the polyp tissue was completely retrieved.   Moderate diverticulosis was noted.   There was evidence of a prior colo-colonic surgical anastomosis in the sigmoid colon.   Retroflexed views revealed internal hemorrhoids. The time to cecum=3 minutes 23 seconds.  Withdrawal time=17 minutes 33 seconds.  The scope was withdrawn and the procedure completed. COMPLICATIONS: There were no complications.  ENDOSCOPIC IMPRESSION: 1.   Normal mucosa in the terminal ileum 2.   Five sessile polyps ranging between 5-67mm in size were found at the cecum, ileocecal valve, in the ascending colon, and transverse colon; polypectomy was performed with a cold snare 3.   Moderate diverticulosis was noted 4.   There was evidence of a prior colo-colonic surgical anastomosis in the sigmoid colon  RECOMMENDATIONS: Repeat Colonoscopy in 3 years.   eSigned:  Eustace Quail, MD 05/27/2014 4:37 PM   cc: The Patient    ; Garret Reddish, MD   PATIENT NAME:  Tanya, Leon MR#: [id number]

## 2014-05-27 NOTE — Patient Instructions (Addendum)
YOU HAD AN ENDOSCOPIC PROCEDURE TODAY AT THE [facility]: Refer to the procedure report that was given to you for any specific questions about what was found during the examination.  If the procedure report does not answer your questions, please call your gastroenterologist to clarify.  If you requested that your care partner not be given the details of your procedure findings, then the procedure report has been included in a sealed envelope for you to review at your convenience later.  YOU SHOULD EXPECT: Some feelings of bloating in the abdomen. Passage of more gas than usual.  Walking can help get rid of the air that was put into your GI tract during the procedure and reduce the bloating. If you had a lower endoscopy (such as a colonoscopy or flexible sigmoidoscopy) you may notice spotting of blood in your stool or on the toilet paper. If you underwent a bowel prep for your procedure, then you may not have a normal bowel movement for a few days.  DIET: Your first meal following the procedure should be a light meal and then it is ok to progress to your normal diet.  A half-sandwich or bowl of soup is an example of a good first meal.  Heavy or fried foods are harder to digest and may make you feel nauseous or bloated.  Likewise meals heavy in dairy and vegetables can cause extra gas to form and this can also increase the bloating.  Drink plenty of fluids but you should avoid alcoholic beverages for 24 hours.  ACTIVITY: Your care partner should take you home directly after the procedure.  You should plan to take it easy, moving slowly for the rest of the day.  You can resume normal activity the day after the procedure however you should NOT DRIVE or use heavy machinery for 24 hours (because of the sedation medicines used during the test).    SYMPTOMS TO REPORT IMMEDIATELY: A gastroenterologist can be reached at any hour.  During normal business hours, 8:30 AM to 5:00 PM Monday through Friday,  call [phone].  After hours and on weekends, please call the GI answering service at [phone] who will take a message and have the physician on call contact you.   Following lower endoscopy (colonoscopy or flexible sigmoidoscopy):  Excessive amounts of blood in the stool  Significant tenderness or worsening of abdominal pains  Swelling of the abdomen that is new, acute  Fever of 100F or higher    FOLLOW UP: If any biopsies were taken you will be contacted by phone or by letter within the next 1-3 weeks.  Call your gastroenterologist if you have not heard about the biopsies in 3 weeks.  Our staff will call the home number listed on your records the next business day following your procedure to check on you and address any questions or concerns that you may have at that time regarding the information given to you following your procedure. This is a courtesy call and so if there is no answer at the home number and we have not heard from you through the emergency physician on call, we will assume that you have returned to your regular daily activities without incident.  SIGNATURES/CONFIDENTIALITY: You and/or your care partner have signed paperwork which will be entered into your electronic medical record.  These signatures attest to the fact that that the information above on your After Visit Summary has been reviewed and is understood.  Full responsibility of the confidentiality   of this discharge information lies with you and/or your care-partner.   Information on polyps and diverticulosis given to you today  Diverticulosis Diverticulosis is the condition that develops when small pouches (diverticula) form in the wall of your colon. Your colon, or large intestine, is where water is absorbed and stool is formed. The pouches form when the inside layer of your colon pushes through weak spots in the outer layers of your colon. CAUSES  No one knows exactly what causes  diverticulosis. RISK FACTORS  Being older than 51. Your risk for this condition increases with age. Diverticulosis is rare in people younger than 40 years. By age 65, almost everyone has it.  Eating a low-fiber diet.  Being frequently constipated.  Being overweight.  Not getting enough exercise.  Smoking.  Taking over-the-counter pain medicines, like aspirin and ibuprofen. SYMPTOMS  Most people with diverticulosis do not have symptoms. DIAGNOSIS  Because diverticulosis often has no symptoms, health care providers often discover the condition during an exam for other colon problems. In many cases, a health care provider will diagnose diverticulosis while using a flexible scope to examine the colon (colonoscopy). TREATMENT  If you have never developed an infection related to diverticulosis, you may not need treatment. If you have had an infection before, treatment may include:  Eating more fruits, vegetables, and grains.  Taking a fiber supplement.  Taking a live bacteria supplement (probiotic).  Taking medicine to relax your colon. HOME CARE INSTRUCTIONS   Drink at least 6-8 glasses of water each day to prevent constipation.  Try not to strain when you have a bowel movement.  Keep all follow-up appointments. If you have had an infection before:  Increase the fiber in your diet as directed by your health care provider or dietitian.  Take a dietary fiber supplement if your health care provider approves.  Only take medicines as directed by your health care provider. SEEK MEDICAL CARE IF:   You have abdominal pain.  You have bloating.  You have cramps.  You have not gone to the bathroom in 3 days. SEEK IMMEDIATE MEDICAL CARE IF:   Your pain gets worse.  Yourbloating becomes very bad.  You have a fever or chills, and your symptoms suddenly get worse.  You begin vomiting.  You have bowel movements that are bloody or black. MAKE SURE YOU:  Understand these  instructions.  Will watch your condition.  Will get help right away if you are not doing well or get worse. Document Released: 06/13/2004 Document Revised: 09/21/2013 Document Reviewed: 08/11/2013 [facility] Patient Information 2015 [place], [place]. This information is not intended to replace advice given to you by your health care provider. Make sure you discuss any questions you have with your health care provider.

## 2014-05-27 NOTE — Progress Notes (Signed)
Called to room to assist during endoscopic procedure.  Patient ID and intended procedure confirmed with present staff. Received instructions for my participation in the procedure from the performing physician.  

## 2014-05-30 ENCOUNTER — Telehealth: Payer: Self-pay | Admitting: *Deleted

## 2014-05-30 DIAGNOSIS — H53419 Scotoma involving central area, unspecified eye: Secondary | ICD-10-CM | POA: Diagnosis not present

## 2014-05-30 DIAGNOSIS — H409 Unspecified glaucoma: Secondary | ICD-10-CM | POA: Diagnosis not present

## 2014-05-30 DIAGNOSIS — H4011X0 Primary open-angle glaucoma, stage unspecified: Secondary | ICD-10-CM | POA: Diagnosis not present

## 2014-05-30 DIAGNOSIS — H27139 Posterior dislocation of lens, unspecified eye: Secondary | ICD-10-CM | POA: Diagnosis not present

## 2014-05-30 NOTE — Telephone Encounter (Signed)
  Follow up Call-  Call back number 05/27/2014  Post procedure Call Back phone  # [phone]  Permission to leave phone message Yes     No answer, left message.

## 2014-05-31 ENCOUNTER — Ambulatory Visit: Payer: Medicare Other | Admitting: Family Medicine

## 2014-06-01 ENCOUNTER — Ambulatory Visit (INDEPENDENT_AMBULATORY_CARE_PROVIDER_SITE_OTHER): Payer: Medicare Other | Admitting: Family Medicine

## 2014-06-01 ENCOUNTER — Encounter: Payer: Self-pay | Admitting: Family Medicine

## 2014-06-01 ENCOUNTER — Other Ambulatory Visit: Payer: Self-pay | Admitting: Family Medicine

## 2014-06-01 VITALS — BP 120/80 | HR 60 | Temp 97.6°F | Wt 134.0 lb

## 2014-06-01 DIAGNOSIS — F329 Major depressive disorder, single episode, unspecified: Secondary | ICD-10-CM | POA: Insufficient documentation

## 2014-06-01 DIAGNOSIS — L718 Other rosacea: Secondary | ICD-10-CM | POA: Insufficient documentation

## 2014-06-01 DIAGNOSIS — J309 Allergic rhinitis, unspecified: Secondary | ICD-10-CM

## 2014-06-01 DIAGNOSIS — Z96649 Presence of unspecified artificial hip joint: Secondary | ICD-10-CM

## 2014-06-01 DIAGNOSIS — F3289 Other specified depressive episodes: Secondary | ICD-10-CM

## 2014-06-01 DIAGNOSIS — H409 Unspecified glaucoma: Secondary | ICD-10-CM | POA: Insufficient documentation

## 2014-06-01 DIAGNOSIS — G4700 Insomnia, unspecified: Secondary | ICD-10-CM

## 2014-06-01 DIAGNOSIS — Z96641 Presence of right artificial hip joint: Secondary | ICD-10-CM

## 2014-06-01 DIAGNOSIS — F32A Depression, unspecified: Secondary | ICD-10-CM | POA: Insufficient documentation

## 2014-06-01 NOTE — Assessment & Plan Note (Signed)
Doing well on Ambien. Continue current medication

## 2014-06-01 NOTE — Assessment & Plan Note (Signed)
Well-controlled on Cymbalta. Not clear on indication for Lyrica. Consider weaning Lyrica next visit. If one year without depressive symptoms or looming symptoms consider trial off Cymbalta

## 2014-06-01 NOTE — Progress Notes (Signed)
Tanya Reddish, MD Phone: [phone]  Subjective:  Patient presents today to establish care with me as their new primary care provider. Patient was formerly a patient of Dr. Arnoldo Morale. Chief complaint-noted.   Insomnia Well controlled on Ambien. No driving difficulties ROS- No SI HI or vivid dreams  Depression States started after a difficult period with multiple surgeries including colostomy bag. Has been on lyrica and cymbalta since that time. Denies current symptoms. She does admit several months ago she had feelings that her depression may return but it did not. ROS-as above no SI or HI  The following were reviewed and entered/updated in epic: Past Medical History  Diagnosis Date  . Osteopenia   . Constipation     opiod induced associated with back pain  . Low back pain   . Lumbar spondylolysis   . Arthritis   . Depression   . Diverticulosis   . Colon polyps     adenomatous  . Heart murmur     benign, dagnosed around age 88,prophylactic antibiotic  . DIVERTICULOSIS, COLON 01/13/2009   Patient Active Problem List   Diagnosis Date Noted  . INSOMNIA, CHRONIC 07/22/2007    Priority: Medium  . 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 COLONIC POLYPS 01/13/2009    Priority: Low  . 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 SPINAL STENOSIS, LUMBAR 01/25/2008    Priority: Low  . ALLERGIC RHINITIS 05/21/2007    Priority: Low  . Osteopenia 02/26/2007    Priority: Low   Past Surgical History  Procedure Laterality Date  . Tonsillectomly    . Abdominal hysterectomy      with ovaries  . Perforated deverticular abscess/laporotomy and colostomy    . Take down of colostomy    . Lumbar fusion      L4-L5  . Abdominal hernia repair Right     incisional  . Incisional ab  05/28/11  . Partial knee arthroplasty Right   . Eye muscle surgery    . Partial knee arthroplasty Left   . Stribismus eye surgery Right   . Glaucoma  surgery Left 05/12/14    Family History  Problem Relation Age of Onset  . Alzheimer's disease Father   . Heart disease Father     mitral valve replaced. unknown reason  . Breast cancer Mother     38  . Prostate cancer Brother   . Colon polyps Mother   . Diabetes Mother   . Heart disease Mother 62    CHF, MI 88  . Heart attack Mother     x 2    Medications- reviewed and updated Current Outpatient Prescriptions  Medication Sig Dispense Refill  . AMBULATORY NON FORMULARY MEDICATION Evening Primerose Oil 1300 mg take 1 by mouth twice a day      . aspirin-acetaminophen-caffeine (EXCEDRIN EXTRA STRENGTH) 250-250-65 MG per tablet Take 2 tablets by mouth every 6 (six) hours as needed.       . Calcium-Magnesium 500-250 MG TABS Take 1 tablet by mouth 2 (two) times daily.      . carboxymethylcellulose (REFRESH PLUS) 0.5 % SOLN Place 1 drop into both eyes as needed.      . Cholecalciferol (VITAMIN D) 1000 UNITS capsule Take 1,000 Units by mouth daily.       . Ciclopirox 1 % shampoo 2 (two) times a week.       . doxycycline (VIBRAMYCIN) 100 MG capsule Take  100 mg by mouth daily.      . DULoxetine (CYMBALTA) 60 MG capsule Take 1 capsule (60 mg total) by mouth daily.  90 capsule  1  . estradiol (VIVELLE-DOT) 0.05 MG/24HR Place 1 patch onto the skin. 2 times a week       . fluticasone (FLONASE) 50 MCG/ACT nasal spray Use 2 sprays in each  nostril daily  48 g  3  . Lutein 20 MG TABS Take 1 tablet by mouth daily.      . Multiple Vitamins-Minerals (MULTIVITAMIN PO) Take 1 tablet by mouth daily.      Marland Kitchen neomycin-polymyxin b-dexamethasone (MAXITROL) 3.5-10000-0.1 OINT Place 1 application into both eyes every 30 (thirty) days. One week per month      . Olopatadine HCl (PATADAY) 0.2 % SOLN Place 1 drop into both eyes daily.       Marland Kitchen omega-3 acid ethyl esters (LOVAZA) 1 G capsule Take 2 g by mouth daily.        . polyethylene glycol powder (GLYCOLAX/MIRALAX) powder Take 17 g by mouth once.      .  pregabalin (LYRICA) 75 MG capsule Take 1 capsule (75 mg total) by mouth 2 (two) times daily.  180 capsule  1  . Probiotic Product (ALIGN) 4 MG CAPS Take by mouth daily.        . RESTASIS 0.05 % ophthalmic emulsion Place 1 drop into both eyes 2 (two) times daily.       Marland Kitchen tiZANidine (ZANAFLEX) 2 MG tablet Take 2 mg by mouth at bedtime.      . vitamin C (ASCORBIC ACID) 500 MG tablet Take 500 mg by mouth daily.      Marland Kitchen azithromycin (AZASITE) 1 % ophthalmic solution Place 1 drop into both eyes every 30 (thirty) days. One week per month      . zolpidem (AMBIEN) 5 MG tablet Take 1 tablet (5 mg total) by mouth at bedtime as needed.  90 tablet  1   No current facility-administered medications for this visit.    Allergies-reviewed and updated No Known Allergies  History   Social History  . Marital Status: Married    Spouse Name: N/A    Number of Children: 2  . Years of Education: N/A   Occupational History  . cpa/retired   .     Social History Main Topics  . Smoking status: Never Smoker   . Smokeless tobacco: Never Used  . Alcohol Use: Yes     Comment: 1 per day  . Drug Use: No  . Sexual Activity: Yes   Other Topics Concern  . None   Social History Narrative   Married 54 years in 2015. 2 kids. 4 grandkids.       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 Previously [employer].     ROS--See HPI   Objective: BP 120/80  Pulse 60  Temp(Src) 97.6 F (36.4 C)  Wt 134 lb (60.782 kg) Gen: NAD, resting comfortably  Assessment/Plan:  Depression Well-controlled on Cymbalta. Not clear on indication for Lyrica. Consider weaning Lyrica next visit. If one year without depressive symptoms or looming symptoms consider trial off Cymbalta  INSOMNIA, CHRONIC Doing well on Ambien. Continue current medication   Due to history of metal on metal hip replacement: Orders Placed This Encounter  Procedures  . Chromium level  . Cobalt

## 2014-06-01 NOTE — Patient Instructions (Addendum)
Wonderful to meet you!   Let's check your blood levels today for your concern of metal in the blood.   Come back in 6 months.   If you go 1 year without feelings of looming depression, we could consider trialing off of the cymbalta. We could also consider coming down on lyrica at some point.   Health Maintenance Due  Topic Date Due  . Influenza Vaccine - 2 or 3 months from now 04/30/2014

## 2014-06-03 ENCOUNTER — Encounter: Payer: Self-pay | Admitting: Internal Medicine

## 2014-06-03 LAB — CHROMIUM LEVEL: CHROMIUM: 1.1 ug/L (ref <=1.2)

## 2014-06-10 LAB — COBALT: Cobalt, Plasma: 1.8 mcg/L (ref <1.9)

## 2014-06-13 ENCOUNTER — Encounter: Payer: Self-pay | Admitting: Family Medicine

## 2014-07-07 ENCOUNTER — Encounter: Payer: Self-pay | Admitting: Family Medicine

## 2014-07-07 ENCOUNTER — Ambulatory Visit (INDEPENDENT_AMBULATORY_CARE_PROVIDER_SITE_OTHER): Payer: Medicare Other | Admitting: Family Medicine

## 2014-07-07 DIAGNOSIS — Z23 Encounter for immunization: Secondary | ICD-10-CM

## 2014-07-07 DIAGNOSIS — C4491 Basal cell carcinoma of skin, unspecified: Secondary | ICD-10-CM | POA: Insufficient documentation

## 2014-07-26 DIAGNOSIS — C44311 Basal cell carcinoma of skin of nose: Secondary | ICD-10-CM | POA: Diagnosis not present

## 2014-08-03 DIAGNOSIS — H4011X2 Primary open-angle glaucoma, moderate stage: Secondary | ICD-10-CM | POA: Diagnosis not present

## 2014-09-01 ENCOUNTER — Encounter: Payer: Self-pay | Admitting: Family Medicine

## 2014-09-01 ENCOUNTER — Telehealth: Payer: Self-pay

## 2014-09-01 ENCOUNTER — Other Ambulatory Visit: Payer: Self-pay

## 2014-09-01 MED ORDER — ZOLPIDEM TARTRATE 5 MG PO TABS: 5.0000 mg | ORAL_TABLET | Freq: Every evening | ORAL | Status: DC | PRN

## 2014-09-01 MED ORDER — TIZANIDINE HCL 2 MG PO TABS: 2.0000 mg | ORAL_TABLET | Freq: Every day | ORAL | Status: DC

## 2014-09-01 NOTE — Telephone Encounter (Signed)
yes

## 2014-09-01 NOTE — Telephone Encounter (Signed)
error 

## 2014-09-05 DIAGNOSIS — H27132 Posterior dislocation of lens, left eye: Secondary | ICD-10-CM | POA: Diagnosis not present

## 2014-09-05 DIAGNOSIS — H3531 Nonexudative age-related macular degeneration: Secondary | ICD-10-CM | POA: Diagnosis not present

## 2014-09-05 DIAGNOSIS — H4011X2 Primary open-angle glaucoma, moderate stage: Secondary | ICD-10-CM | POA: Diagnosis not present

## 2014-09-05 DIAGNOSIS — H35371 Puckering of macula, right eye: Secondary | ICD-10-CM | POA: Diagnosis not present

## 2014-10-19 ENCOUNTER — Encounter: Payer: Self-pay | Admitting: Podiatry

## 2014-10-19 ENCOUNTER — Ambulatory Visit (INDEPENDENT_AMBULATORY_CARE_PROVIDER_SITE_OTHER): Payer: Medicare Other | Admitting: Podiatry

## 2014-10-19 VITALS — BP 145/81 | HR 61 | Resp 18

## 2014-10-19 DIAGNOSIS — L603 Nail dystrophy: Secondary | ICD-10-CM | POA: Diagnosis not present

## 2014-10-19 DIAGNOSIS — B351 Tinea unguium: Secondary | ICD-10-CM

## 2014-10-19 DIAGNOSIS — L600 Ingrowing nail: Secondary | ICD-10-CM | POA: Diagnosis not present

## 2014-10-19 NOTE — Patient Instructions (Addendum)

## 2014-10-19 NOTE — Progress Notes (Signed)
   Subjective:    Patient ID: Tanya Leon, female    DOB: 09/08/1941, 74 y.o.   MRN: [id number]  HPI  74 year old female presents the office today with complaints of painful ingrown toenails to both of her big toes of the left worse in the right. He states that the nail borders have been painful, particularly shoe gear and pressure. She denies any recent drainage or possible the area. She said that his been ongoing for approximately one month. She denies any significant redness or streaking from the area. No other complaints at this time.     Review of Systems  Musculoskeletal:       JOINT PAIN  All other systems reviewed and are negative.      Objective:   Physical Exam AAO x3, NAD DP/PT pulses palpable bilaterally, CRT less than 3 seconds Protective sensation intact with Simms Weinstein monofilament, vibratory sensation intact, Achilles tendon reflex intact Tenderness to palpation overlying the nail borders of bilateral hallux and left greater than right. Evidence of ingrowing on the nail border. There is no surrounding edema, erythema, increased warmth. There is no drainage or purulence expressed from the nail borders. Hallux nails are hypertrophic, dystrophic, discolored. The remaining nails are also discolored. No tenderness to remaining nails. No other areas of discomfort of bilateral lower extremities. No overlying edema, erythema, increase in warmth bilaterally. No open lesions or pre-ulcerative lesions. No interdigital maceration. No pain with calf compression, swelling, warmth, erythema.        Assessment & Plan:  74 year old female with bilateral hallux symptomatic ingrown toenail, no signs of infection at this time. -Treatment options both conservative and surgical were discussed the patient including alternatives, risks, competitions. Discussed with the patient a slant back procedure versus a partial nail avulsion with chemical matricectomy. Risks and complications of  both were discussed the patient at times itches with her proceed with a partial nail avulsion. Patient verbally consented to the procedure. Under sterile conditions a total of 2.5 mL of a one-to-one mixture of 2% lidocaine plain and 0.5% Marcaine plain was infiltrated in a hallux block fashion to the left hallux after the site was properly identified. Once anesthetized, the skin was then prepped in a sterile fashion. A tourniquet was applied. Next the symptomatic border of the left hallux toenail was sharply excised making sure to remove the entire offending nail border. Once the nail border was assured to be removed the area was debrided and the underlying skin was intact. There is no clinical signs of infection at this time. Phenol was then applied under standard conditions and the copious the irrigated. Silvadene was applied followed by dry sterile dressing. After application of the dressing the tourniquet was removed and there is found to be an immediate capillary refill time to the digit. The patient tolerated the procedure well any complications. Post procedure instructions were discussed the patient for which she verbally understood. -Removed portion of the nails was sent to [facility] labs for evaluation of onychomycosis. Discussed Treatments for onychomycosis however will await results of the biopsy before starting treatment. -Follow-up in 1 week or sooner should any problems arise. In the meantime, occur to call the office with any questions, concerns, change in symptoms.

## 2014-10-25 DIAGNOSIS — T8522XA Displacement of intraocular lens, initial encounter: Secondary | ICD-10-CM | POA: Diagnosis not present

## 2014-11-02 ENCOUNTER — Ambulatory Visit (INDEPENDENT_AMBULATORY_CARE_PROVIDER_SITE_OTHER): Payer: Medicare Other | Admitting: Podiatry

## 2014-11-02 VITALS — BP 122/78 | HR 66 | Resp 16

## 2014-11-02 DIAGNOSIS — L600 Ingrowing nail: Secondary | ICD-10-CM | POA: Diagnosis not present

## 2014-11-02 DIAGNOSIS — B351 Tinea unguium: Secondary | ICD-10-CM

## 2014-11-02 DIAGNOSIS — Z9889 Other specified postprocedural states: Secondary | ICD-10-CM

## 2014-11-02 NOTE — Patient Instructions (Signed)

## 2014-11-04 NOTE — Progress Notes (Signed)
Patient ID: Tanya Leon, female   DOB: [birth year], 74 y.o.   MRN: [id number]  Subjective: 74 year old female returns the office today for follow-up evaluation status post left hallux partial nail avulsion. She states that overall she is doing well however she does continue to have some mild discomfort intermittently to the area. She denies any significant redness overlying the area and or any streaking. Denies any drainage or purulence. She is able to wear regular shoe without difficulty. She has discontinued that Epson salt soaks. Denies any systemic complaints as fevers, chills, nausea, vomiting. No other complaints at this time.  Objective: AAO x3, NAD DP/PT pulses palpable bilaterally, CRT less than 3 seconds Protective sensation intact with Simms Weinstein monofilament, vibratory sensation intact, Achilles tendon reflex intact Left hallux nail status post partial nail avulsion which is healing well. There is a small scab along the nail border with a small amount of granulation tissue which is not completely healed. There is no surrounding erythema, ascending cellulitis, fluctuance, crepitus or other clinical signs of infection at this time. There is slight tenderness on palpation overlying the procedure site. No other areas of tenderness to bilateral lower extremities. MMT 5/5, ROM WNL.  No open lesions or pre-ulcerative lesions.  No overlying edema, erythema, increase in warmth to bilateral lower extremities.  No pain with calf compression, swelling, warmth, erythema bilaterally.   Assessment: 74 year old female status post left hallux partial nail avulsion secondary to pain  Plan: -Recommended the patient continue Epson salt soaks twice a day followed by antibiotic ointment and a Band-Aid until the area has completely healed. Can leave the area uncovered at night. Follow-up in 2 weeks if the area has not completely healed or if it remains symptomatic. Encouraged to call sooner if there is  any questions, concerns, change in symptoms.

## 2014-11-07 ENCOUNTER — Telehealth: Payer: Self-pay | Admitting: *Deleted

## 2014-11-07 NOTE — Telephone Encounter (Signed)
I left a message for the patient to call me back.  I need to inform her of culture results.  They were negative for fungus.  Can prescribe Nuvail to reduce thickness per Dr. Milinda Pointer.

## 2014-11-07 NOTE — Telephone Encounter (Signed)
"  I'm returning your call."  Dr. Milinda Pointer wanted to inform you that your culture came back negative for fungus.  He said if you want medication to thin it out he can prescribe Nuvail.  "That's great news.  So is it an oral or a topical or what?"  It is a topical.  "Do you know if it has any side effects?"  No, I am not aware of any side effects.   "Well, I'm scheduled to come in there to see him about this other toenail, I will discuss it then.  Thanks for calling."

## 2014-11-10 ENCOUNTER — Encounter: Payer: Self-pay | Admitting: Podiatry

## 2014-11-15 ENCOUNTER — Encounter: Payer: Self-pay | Admitting: Podiatry

## 2014-11-15 ENCOUNTER — Ambulatory Visit (INDEPENDENT_AMBULATORY_CARE_PROVIDER_SITE_OTHER): Payer: Medicare Other | Admitting: Podiatry

## 2014-11-15 VITALS — BP 117/73 | HR 75 | Resp 18

## 2014-11-15 DIAGNOSIS — L603 Nail dystrophy: Secondary | ICD-10-CM

## 2014-11-15 DIAGNOSIS — L600 Ingrowing nail: Secondary | ICD-10-CM

## 2014-11-15 DIAGNOSIS — B351 Tinea unguium: Secondary | ICD-10-CM | POA: Diagnosis not present

## 2014-11-15 MED ORDER — CEPHALEXIN 500 MG PO CAPS: 500.0000 mg | ORAL_CAPSULE | Freq: Three times a day (TID) | ORAL | Status: DC

## 2014-11-15 NOTE — Patient Instructions (Signed)

## 2014-11-16 NOTE — Addendum Note (Signed)
Addended by: Cranford Mon R on: 11/16/2014 12:14 PM   Modules accepted: Orders

## 2014-11-16 NOTE — Progress Notes (Signed)
Patient ID: Tanya Leon, female   DOB: 01-Apr-1941, 74 y.o.   MRN: [id number]  Subjective: 74 year old female returns the office today requesting nail avulsions with chemical matricectomy to the right hallux both nail borders. She states the nail borders continue to be symptomatic in July to proceed with the procedure at this time. She denies any redness or drainage from the site however the area is tender with palpation and with certain shoes. She also states that remainder of her nails are painful and elongated. She lastly states that she is started to note some increased pain and slight redness overlying the left big toe along the base at the site of the procedure which was performed previously. She states that she's had no problems with this area into the last couple of days. She has discontinue soaking the toe. She denies any drainage or purulence in the area denies any red streaking. She denies any systemic complaints as fevers, chills, nausea, vomiting. No other complaints at this time in no acute changes since last appointment.  Objective: AAO 3, NAD DP/PT pulses palpable, CRT less than 3 seconds Protective sensation intact with Simms Weinstein monofilament, vibratory sensation intact, Achilles tendon reflex intact. There is incurvation with evidence of ingrowing of the toenail on both the medial and lateral nail borders of the right hallux toenail. There is tenderness palpation around his nail borders. There is no surrounding erythema, drainage/purulence, malodor or other clinical signs of infection at this time. On the left hallux there is trace erythema along the proximal lateral aspect of the nail border of the procedure. There is a small scab formation along both the medial and lateral nail borders. There is mild tenderness to palpation over this area. There is no drainage or purulence expressed. No ascending cellulitis. No malodor. No areas of fluctuance or crepitus. Nails are slightly  dystrophic, discolored, hypertrophic, brittle, discolored. There is no surrounding erythema or drainage from the nail sites. There is no significant ingrowing this time. No open lesions or pre-ulcerative lesions are identified. No other areas of tenderness to bilateral lower extremity's. No pain with calf compression, swelling, warmth, erythema.  Assessment: 74 year old female symptomatically ingrown toenail right hallux medial/lateral nail border; mild erythema left hallux; symptomatic onychodystrophy.  Plan: -At this time the patient is requesting partial nail avulsions with chemical matricectomy to the right hallux. Alternatives, risks, competitions were discussed with the patient for which she understands and oblique consents. After the site was properly identified, a total of 2.5 mL mixture of 2% lidocaine plain and 0.5% Marcaine plain was infiltrated into the right hallux under sterile conditions. Additional 1 mL was infiltrated to ensure anesthesia. Once anesthetized, the skin was then prepped in a sterile fashion and a tourniquet was applied. Next both the medial and lateral aspects of the hallux nail border were then sharply excised making sure to remove the entire offending nail border. There is found to be a significant amount ingrowing along the nail borders. There is no purulence or other signs of infection at this time. The underlying skin was intact. Once the nail was a sure to be removed phenol was then applied under standard conditions and then copiously irrigated. Silvadene was applied followed by dry sterile dressing. After application of the dressing the tourniquet was removed and there was found to be an immediate capillary refill time to the digit. Patient tolerated the procedure well without any complications. The nail which was removed was sent to [facility] labs for evaluation of onychomycosis.  -  Left hallux nail procedure site was debrided to remove a small scab. Recommended patient to  continue twice a day Epson salt soaks followed by antibiotic ointment and a Band-Aid. Monitor for any signs or symptoms of worsening infection and directed to call the office should any occur or go to the ER. -Prescribed Keflex. -Nail biopsy results from the left hallux were discussed the patient which did not reveal onychomycosis. At this time she would like the nails for the right side to be sent as well. If it comes back as nail fungus she would proceed with treatment however it is negative she will hold off. -Remaining nail sharply debrided without complications/bleeding. -Follow-up in 1 week or sooner if any problems are to arise. In the meantime, encouraged call the office with any questions, concerns, change in symptoms.

## 2014-11-22 DIAGNOSIS — H35351 Cystoid macular degeneration, right eye: Secondary | ICD-10-CM | POA: Diagnosis not present

## 2014-11-22 DIAGNOSIS — H5000 Unspecified esotropia: Secondary | ICD-10-CM | POA: Diagnosis not present

## 2014-11-22 DIAGNOSIS — H35413 Lattice degeneration of retina, bilateral: Secondary | ICD-10-CM | POA: Diagnosis not present

## 2014-11-23 DIAGNOSIS — H4011X2 Primary open-angle glaucoma, moderate stage: Secondary | ICD-10-CM | POA: Diagnosis not present

## 2014-11-24 DIAGNOSIS — T8522XA Displacement of intraocular lens, initial encounter: Secondary | ICD-10-CM | POA: Diagnosis not present

## 2014-11-29 LAB — HM MAMMOGRAPHY

## 2014-11-30 ENCOUNTER — Ambulatory Visit (INDEPENDENT_AMBULATORY_CARE_PROVIDER_SITE_OTHER): Payer: Medicare Other | Admitting: Podiatry

## 2014-11-30 ENCOUNTER — Ambulatory Visit (INDEPENDENT_AMBULATORY_CARE_PROVIDER_SITE_OTHER): Payer: Medicare Other | Admitting: Family Medicine

## 2014-11-30 ENCOUNTER — Encounter: Payer: Self-pay | Admitting: Podiatry

## 2014-11-30 ENCOUNTER — Encounter: Payer: Self-pay | Admitting: Family Medicine

## 2014-11-30 VITALS — BP 156/73 | HR 70 | Resp 18

## 2014-11-30 VITALS — BP 102/70 | HR 60 | Temp 97.8°F | Wt 137.0 lb

## 2014-11-30 DIAGNOSIS — F329 Major depressive disorder, single episode, unspecified: Secondary | ICD-10-CM

## 2014-11-30 DIAGNOSIS — M48061 Spinal stenosis, lumbar region without neurogenic claudication: Secondary | ICD-10-CM

## 2014-11-30 DIAGNOSIS — F32A Depression, unspecified: Secondary | ICD-10-CM

## 2014-11-30 DIAGNOSIS — M4806 Spinal stenosis, lumbar region: Secondary | ICD-10-CM | POA: Diagnosis not present

## 2014-11-30 DIAGNOSIS — L603 Nail dystrophy: Secondary | ICD-10-CM | POA: Diagnosis not present

## 2014-11-30 DIAGNOSIS — B351 Tinea unguium: Secondary | ICD-10-CM | POA: Diagnosis not present

## 2014-11-30 DIAGNOSIS — M79675 Pain in left toe(s): Secondary | ICD-10-CM

## 2014-11-30 MED ORDER — PREGABALIN 50 MG PO CAPS: 50.0000 mg | ORAL_CAPSULE | Freq: Two times a day (BID) | ORAL | Status: DC

## 2014-11-30 MED ORDER — ZOLPIDEM TARTRATE 5 MG PO TABS: 5.0000 mg | ORAL_TABLET | Freq: Every evening | ORAL | Status: DC | PRN

## 2014-11-30 MED ORDER — DULOXETINE HCL 60 MG PO CPEP: 60.0000 mg | ORAL_CAPSULE | Freq: Every day | ORAL | Status: DC

## 2014-11-30 MED ORDER — FLUTICASONE PROPIONATE 50 MCG/ACT NA SUSP: NASAL | Status: DC

## 2014-11-30 MED ORDER — TIZANIDINE HCL 2 MG PO TABS: 2.0000 mg | ORAL_TABLET | Freq: Every day | ORAL | Status: DC

## 2014-11-30 NOTE — Assessment & Plan Note (Signed)
L4-L5 rod reportedly. Occasional tightness-zanaflex. Also on lyrica for unclear indication. ? Radicular pain in past? We will titrate down to 50mg  BID for 6 months and conside 25mg  at follow up.

## 2014-11-30 NOTE — Patient Instructions (Signed)

## 2014-11-30 NOTE — Progress Notes (Signed)
Garret Reddish, MD Phone: [phone]  Subjective:   Tanya Leon is a 74 y.o. year old very pleasant female patient who presents with the following:  Depression- controlled Chronic pain from ? Spinal stenosis PHQ 2 of 0. Patient states depression has been controlled on cymbalta 60mg . Insomnia portion controlled on ambien. She is also on lyrica for unclear indication. She has had multiple sources of pain such as knee and hip replacement and spinal stenosis. She does have some low back pain ROS- No si/hi. No fecal or urinary incontinence  Past Medical History- depression, insomnia, osteopenia, spinal stenosis, gluacoma, macular degeneration  Medications- reviewed and updated Current Outpatient Prescriptions  Medication Sig Dispense Refill  . AMBULATORY NON FORMULARY MEDICATION Evening Primerose Oil 1300 mg take 1 by mouth twice a day    . aspirin-acetaminophen-caffeine (EXCEDRIN EXTRA STRENGTH) 250-250-65 MG per tablet Take 2 tablets by mouth every 6 (six) hours as needed.     Marland Kitchen azithromycin (AZASITE) 1 % ophthalmic solution Place 1 drop into both eyes every 30 (thirty) days. One week per month    . Calcium-Magnesium 500-250 MG TABS Take 1 tablet by mouth 2 (two) times daily.    . carboxymethylcellulose (REFRESH PLUS) 0.5 % SOLN Place 1 drop into both eyes as needed.    . cephALEXin (KEFLEX) 500 MG capsule Take 1 capsule (500 mg total) by mouth 3 (three) times daily. 30 capsule 2  . Cholecalciferol (VITAMIN D) 1000 UNITS capsule Take 1,000 Units by mouth daily.     . Ciclopirox 1 % shampoo 2 (two) times a week.     . cycloSPORINE (RESTASIS) 0.05 % ophthalmic emulsion Apply to eye.    Marland Kitchen doxycycline (VIBRAMYCIN) 100 MG capsule Take 100 mg by mouth daily.    . DULoxetine (CYMBALTA) 60 MG capsule Take 1 capsule (60 mg total) by mouth daily. 90 capsule 1  . estradiol (VIVELLE-DOT) 0.05 MG/24HR Place 1 patch onto the skin. 2 times a week     . fluocinonide (LIDEX) 0.05 % external solution      . fluticasone (FLONASE) 50 MCG/ACT nasal spray Use 2 sprays in each  nostril daily 48 g 3  . Lutein 20 MG TABS Take 1 tablet by mouth daily.    . Multiple Vitamins-Minerals (MULTIVITAMIN PO) Take 1 tablet by mouth daily.    Marland Kitchen neomycin-polymyxin b-dexamethasone (MAXITROL) 3.5-10000-0.1 OINT Place 1 application into both eyes every 30 (thirty) days. One week per month    . Olopatadine HCl (PATADAY) 0.2 % SOLN Place 1 drop into both eyes daily.     Marland Kitchen omega-3 acid ethyl esters (LOVAZA) 1 G capsule Take 2 g by mouth daily.      . polyethylene glycol powder (GLYCOLAX/MIRALAX) powder Take 17 g by mouth once.    . pregabalin (LYRICA) 75 MG capsule Take 1 capsule (75 mg total) by mouth 2 (two) times daily. 180 capsule 1  . Probiotic Product (ALIGN) 4 MG CAPS Take by mouth daily.      Marland Kitchen tiZANidine (ZANAFLEX) 2 MG tablet Take 1 tablet (2 mg total) by mouth at bedtime. 30 tablet 0  . vitamin C (ASCORBIC ACID) 500 MG tablet Take 500 mg by mouth daily.    Marland Kitchen zolpidem (AMBIEN) 5 MG tablet Take 1 tablet (5 mg total) by mouth at bedtime as needed. 30 tablet 0   Objective: BP 102/70 mmHg  Pulse 60  Temp(Src) 97.8 F (36.6 C) (Oral)  Wt 137 lb (62.143 kg)  SpO2 97% Gen: NAD, resting  comfortably CV: RRR no murmurs rubs or gallops Lungs: CTAB no crackles, wheeze, rhonchi Abdomen: soft/nontender/nondistended/normal bowel sounds. Ext: no edema Skin: warm, dry, slight erythema 4th finger R hand DIP joint with some tenderness to palpation (chronic per patient for months) Neuro: grossly normal, moves all extremities   Assessment/Plan:  Depression History of depression starting after a tough medical time in her life after perforated diverticula in 2007. Never trialed off of medication. Also on lyrica for pain. Given reasonable control, will not titrate down but continue to follow especially to see if pain element was contributing to difficult time as we come off lyrica.    SPINAL STENOSIS, LUMBAR L4-L5  rod reportedly. Occasional tightness-zanaflex. Also on lyrica for unclear indication. ? Radicular pain in past? We will titrate down to 50mg  BID for 6 months and conside 25mg  at follow up.    6 months f/u  Meds ordered this encounter  Medications  . pregabalin (LYRICA) 50 MG capsule    Sig: Take 1 capsule (50 mg total) by mouth 2 (two) times daily.    Dispense:  180 capsule    Refill:  3  . DULoxetine (CYMBALTA) 60 MG capsule    Sig: Take 1 capsule (60 mg total) by mouth daily.    Dispense:  90 capsule    Refill:  3  . tiZANidine (ZANAFLEX) 2 MG tablet    Sig: Take 1 tablet (2 mg total) by mouth at bedtime.    Dispense:  30 tablet    Refill:  5  . zolpidem (AMBIEN) 5 MG tablet    Sig: Take 1 tablet (5 mg total) by mouth at bedtime as needed.    Dispense:  30 tablet    Refill:  5  . fluticasone (FLONASE) 50 MCG/ACT nasal spray    Sig: Use 2 sprays in each  nostril daily    Dispense:  48 g    Refill:  5

## 2014-11-30 NOTE — Progress Notes (Signed)
Pre visit review using our clinic review tool, if applicable. No additional management support is needed unless otherwise documented below in the visit note. 

## 2014-11-30 NOTE — Patient Instructions (Signed)
Let's trial down to lyrica 50mg  twice a day for 6 months.   No other changes  Follow up 6 months for annual wellness visit. Come in fasting and we can update bloodwork.

## 2014-11-30 NOTE — Assessment & Plan Note (Signed)
History of depression starting after a tough medical time in her life after perforated diverticula in 2007. Never trialed off of medication. Also on lyrica for pain. Given reasonable control, will not titrate down but continue to follow especially to see if pain element was contributing to difficult time as we come off lyrica.

## 2014-12-01 NOTE — Progress Notes (Signed)
Patient ID: Tanya Leon, female   DOB: 03-20-1941, 74 y.o.   MRN: [id number]  Subjective: 74 year old female returns the office they with her husband for follow-up evaluation of bilateral hallux partial nail avulsions. She says the right side is doing well she's had no problems. She states the left side she has continued to have some discomfort along the base of the nail as well as some redness localized to the area. She denies any purulence or any drainage. She is continues of the feet twice a day Epson salts currently with antibiotic ointment and a Band-Aid. She denies any red streaking. Denies any systemic complaints as fevers, chills, nausea, vomiting. No other complaints at this time.  Objective: AAO 3, NAD DP/PT pulses are palpable bilaterally, CRT less than 3 seconds Protective sensation intact with Simms Weinstein monofilament, vibratory sensation intact, Achilles tendon reflex intact. The right hallux nail status post medial and lateral partial nail avulsions which is healing well for this timeframe. Small granulation tissue within the proximal nail borders however the remainder of the area has scabbed over. There is no tenderness to palpation overlying the site and there is no swelling erythema, ascending cellulitis. There is no drainage or purulence. No malodor. No areas of fluctuance or crepitus. On the left hallux toenail there is tenderness palpation upon the base of the toenail there is an area of erythema along the proximal nail border. Erythema along the proximal nail border. There is no ascending cellulitis. There is no drainage or purulence expressed. Left hallux toe nails hypertrophic, dystrophic, discolored Remaining nails without pathology. No open lesions or pre-ulcerative lesions are identified elsewhere. No pain with calf compression, swelling, warmth, erythema.  Assessment: 74 year old female with healing right hallux status post partial nail avulsions; continue tenderness  and erythema overlying the left hallux toenail.  Plan: -Conservative and surgical treatment options were discussed the patient including alternatives, risks, complications. -On the right side continue soaking in Epson salt soaks twice a day followed by antibiotic ointment and a Band-Aid until the area has completely healed. Can leave the area uncovered at night. -In regards the left hallux toenail due to the continued discomfort along the base the toenail and the erythema around the toenail recommend total nail excision at this time. Risks and competitions of the procedure were discussed the patient and she understands and verbally consents. After the site was properly identified the left hallux was infiltrated with a total of 3 mL of a one-to-one mixture of 2% lidocaine plain and 0.5% Marcaine plain. Once anesthetized the skin was then prepped in sterile fashion. Next the left hallux toenail was excised in total patient to remove all offending nail borders. There is no purulence identified in the procedure. Once the nail was removed the nail borders were debrided and the underlying skin was intact. The area was irrigated. Silvadene was applied followed by dry sterile dressing. After application the dressing the tourniquet was removed and there was found to be an immediate capillary refill time to the digit. Patient tolerated the procedure well any complications. The toenail was sent to [facility] for evaluation of onychomycosis as well. Post procedure instructions were given discussed the patient for which she understands. Recommended her to get the refill of keflex and continue that for 1 round. Monitor for any signs or symptoms of infection injected call the office immediately or go directly to the emergency room. -Follow-up in one week for nail check or sooner if any problems are to arise. In the meantime,  occurs to call the office with any questions, concerns, change in symptoms.

## 2014-12-05 ENCOUNTER — Encounter: Payer: Self-pay | Admitting: Podiatry

## 2014-12-07 ENCOUNTER — Ambulatory Visit: Payer: Medicare Other | Admitting: Podiatry

## 2014-12-09 ENCOUNTER — Encounter: Payer: Self-pay | Admitting: Podiatry

## 2014-12-09 ENCOUNTER — Ambulatory Visit (INDEPENDENT_AMBULATORY_CARE_PROVIDER_SITE_OTHER): Payer: Medicare Other | Admitting: Podiatry

## 2014-12-09 VITALS — BP 130/80 | HR 56 | Resp 12

## 2014-12-09 DIAGNOSIS — B351 Tinea unguium: Secondary | ICD-10-CM | POA: Diagnosis not present

## 2014-12-09 DIAGNOSIS — Z9889 Other specified postprocedural states: Secondary | ICD-10-CM

## 2014-12-09 MED ORDER — EFINACONAZOLE 10 % EX SOLN: 1.0000 [drp] | Freq: Every day | CUTANEOUS | Status: DC

## 2014-12-09 NOTE — Patient Instructions (Addendum)
Continue soaking in epsom salts twice a day followed by antibiotic ointment and a band-aid. Can leave uncovered at night. Continue this until completely healed.  If the area has not healed in 2 weeks, call the office for follow-up appointment, or sooner if any problems arise.  Monitor for any signs/symptoms of infection. Call the office immediately if any occur or go directly to the emergency room. Call with any questions/concerns.  Onychomycosis/Fungal Toenails  WHAT IS IT? An infection that lies within the keratin of your nail plate that is caused by a fungus.  WHY ME? Fungal infections affect all ages, sexes, races, and creeds.  There may be many factors that predispose you to a fungal infection such as age, coexisting medical conditions such as diabetes, or an autoimmune disease; stress, medications, fatigue, genetics, etc.  Bottom line: fungus thrives in a warm, moist environment and your shoes offer such a location.  IS IT CONTAGIOUS? Theoretically, yes.  You do not want to share shoes, nail clippers or files with someone who has fungal toenails.  Walking around barefoot in the same room or sleeping in the same bed is unlikely to transfer the organism.  It is important to realize, however, that fungus can spread easily from one nail to the next on the same foot.  HOW DO WE TREAT THIS?  There are several ways to treat this condition.  Treatment may depend on many factors such as age, medications, pregnancy, liver and kidney conditions, etc.  It is best to ask your doctor which options are available to you.  1. No treatment.   Unlike many other medical concerns, you can live with this condition.  However for many people this can be a painful condition and may lead to ingrown toenails or a bacterial infection.  It is recommended that you keep the nails cut short to help reduce the amount of fungal nail. 2. Topical treatment.  These range from herbal remedies to prescription strength nail lacquers.   About 40-50% effective, topicals require twice daily application for approximately 9 to 12 months or until an entirely new nail has grown out.  The most effective topicals are medical grade medications available through physicians offices. 3. Oral antifungal medications.  With an 80-90% cure rate, the most common oral medication requires 3 to 4 months of therapy and stays in your system for a year as the new nail grows out.  Oral antifungal medications do require blood work to make sure it is a safe drug for you.  A liver function panel will be performed prior to starting the medication and after the first month of treatment.  It is important to have the blood work performed to avoid any harmful side effects.  In general, this medication safe but blood work is required. 4. Laser Therapy.  This treatment is performed by applying a specialized laser to the affected nail plate.  This therapy is noninvasive, fast, and non-painful.  It is not covered by insurance and is therefore, out of pocket.  The results have been very good with a 80-95% cure rate.  The [facility] is the only practice in the area to offer this therapy. 5. Permanent Nail Avulsion.  Removing the entire nail so that a new nail will not grow back.

## 2014-12-09 NOTE — Progress Notes (Signed)
   Subjective:    Patient ID: Tanya Leon, female    DOB: 03/02/1941, 74 y.o.   MRN: [id number]  HPI  74 year old female presents the office they for follow up evaluation status post left hallux nail excision and right partial nail avulsion. She says the right side is doing well however left side is still somewhat tender. She states that his gotten better on the left side and is not as sensitive is what it was. She doesn't there is a small amount of redness still persisting on the nail border however it is improved. She denies any drainage or purulence bilaterally. Denies any red streaking. Denies any systemic complaints as fevers, chills, nausea, vomiting. No other complaints at this time.  Review of Systems  All other systems reviewed and are negative.      Objective:   Physical Exam AAO x3, NAD DP/PT pulses palpable CRT less than 3 seconds Protective sensation intact with Simms Weinstein monofilament, vibratory sensation intact, Achilles tendon reflex intact. Left hallux status post total nail avulsion. There is mild tenderness of on palpation of the proximal nail border. There is small amount of granulation tissue the proximal nail border. There is a faint rim of erythema on the proximal nail border however it is improved compared to last appointment. There is no drainage or purulence expressed. No ascending cellulitis, fluctuance, crepitus, malodor. Right hallux has been is partial nail avulsion which is healed at this time and this is scab/hyperkeratotic lesion within the nail procedure site. There is no surrounding erythema, ascending cellulitis, fluctuance, crepitus, drainage, malodor. There is no tenderness to palpation overlying the site.  The remaining nails to appear to be hypertrophic, dystrophic, discolored. No tenderness along the remaining nails and there is no surrounding erythema or drainage. No other areas of tenderness to bilateral lower extremities.  No other open lesions or  pre-ulcerative lesions bilaterally No pain with calf compression, swelling, warmth, erythema.     Assessment & Plan:  74 year old female s/p left total hallux nail avulsion, and right partial nail avulsions.  -Treatment options discussed including all alternatives, risks, complications -Continue soaking in epsom salts twice a day followed by antibiotic ointment and a band-aid. Can leave uncovered at night. Continue this until completely healed. If the area has not healed in 2 weeks, call the office for follow-up appointment, or sooner if any problems arise.  -Nail biopsy results were reviewed with the patient on the right foot. Discussed various treatment options. At this time she is also proceed with Jublia. A prescription for this symptom her pharmacy. Discussed how to apply the medication as well as side effects. Directed to stop if any are to occur and call the office.

## 2014-12-19 ENCOUNTER — Encounter: Payer: Self-pay | Admitting: Podiatry

## 2014-12-21 DIAGNOSIS — Z471 Aftercare following joint replacement surgery: Secondary | ICD-10-CM | POA: Diagnosis not present

## 2014-12-21 DIAGNOSIS — Z96653 Presence of artificial knee joint, bilateral: Secondary | ICD-10-CM | POA: Diagnosis not present

## 2014-12-21 DIAGNOSIS — Z96641 Presence of right artificial hip joint: Secondary | ICD-10-CM | POA: Diagnosis not present

## 2014-12-21 DIAGNOSIS — Z09 Encounter for follow-up examination after completed treatment for conditions other than malignant neoplasm: Secondary | ICD-10-CM | POA: Diagnosis not present

## 2014-12-23 DIAGNOSIS — Z803 Family history of malignant neoplasm of breast: Secondary | ICD-10-CM | POA: Diagnosis not present

## 2014-12-23 DIAGNOSIS — Z1231 Encounter for screening mammogram for malignant neoplasm of breast: Secondary | ICD-10-CM | POA: Diagnosis not present

## 2014-12-23 LAB — HM MAMMOGRAPHY: HM Mammogram: NORMAL

## 2014-12-28 ENCOUNTER — Encounter: Payer: Self-pay | Admitting: Family Medicine

## 2014-12-28 ENCOUNTER — Ambulatory Visit (INDEPENDENT_AMBULATORY_CARE_PROVIDER_SITE_OTHER): Payer: Medicare Other | Admitting: Podiatry

## 2014-12-28 ENCOUNTER — Encounter: Payer: Self-pay | Admitting: Podiatry

## 2014-12-28 VITALS — BP 125/75 | HR 55 | Resp 18

## 2014-12-28 DIAGNOSIS — M79676 Pain in unspecified toe(s): Secondary | ICD-10-CM | POA: Diagnosis not present

## 2014-12-28 DIAGNOSIS — Z9889 Other specified postprocedural states: Secondary | ICD-10-CM

## 2014-12-28 DIAGNOSIS — B351 Tinea unguium: Secondary | ICD-10-CM

## 2014-12-28 NOTE — Progress Notes (Signed)
Patient ID: Tanya Leon, female   DOB: 07-05-1941, 74 y.o.   MRN: [id number]  Subjective: 74 year old female returns the office today status post left hallux nail avulsion and right partial nail avulsion. She states that his last appointment there is been significant decrease in redness overlying the left toe and she is doing well. She has no pain to either the digits. He denies any drainage or purulence. She's been soaking the foot once a day Epson salts covering with anabolic ointment and a Band-Aid. Denies any red streaking. She also states that the remainder the nails are painful as they are thickened and elongated. Denies any redness or drainage from the nail sites. Denies any systemic complaints such as fevers, chills, nausea, vomiting. No acute changes since last appointment, and no other complaints at this time.   Objective: AAO x3, NAD DP/PT pulses palpable bilaterally, CRT less than 3 seconds Protective sensation intact with Simms Weinstein monofilament, vibratory sensation intact, Achilles tendon reflex intact Status post left hallux nail avulsion and partial nail avulsion on the right hallux which are healing well. This scab formation overlying the procedure sites. There is a faint rim of erythema along the proximal left hallux nail border although it is significantly improved compared to last appointment. There is no tenderness to palpation overlying the procedure sites. No drainage/purulence. No ascending cellulitis, fluctuance, crepitus, malodor. Remainder the nails are hypertrophic, dystrophic, brittle, elongated, discolored. No areas of pinpoint bony tenderness or pain with vibratory sensation. MMT 5/5, ROM WNL. No edema, erythema, increase in warmth to bilateral lower extremities.  No open lesions or pre-ulcerative lesions.  No pain with calf compression, swelling, warmth, erythema  Assessment:  Plan: -All treatment options discussed with the patient including all alternatives,  risks, complications.  -The procedure sites were debrided. Continue soaking in Epson salt soaks on the left side until healed. It is significantly improved compared to last appointment. If there is any worsening over does not resolve within 2 weeks to call the office. -Nail sharply debrided 8 without complication/bleeding. -Follow-up in 3 months or sooner if any problems are to arise. -Patient encouraged to call the office with any questions, concerns, change in symptoms.

## 2014-12-28 NOTE — Patient Instructions (Signed)

## 2014-12-30 ENCOUNTER — Telehealth: Payer: Self-pay | Admitting: Podiatry

## 2014-12-30 NOTE — Telephone Encounter (Signed)
Tanya Leon- pt called and is upset she asked for you by name - she is wanting to know why she still doesn't have her medication yet- she states that prior auth had to be gotten but it has now been two weeks and she still doesn't have any idea why where it is at. Can someone please call her to update her on wha tis going on. thanks

## 2015-01-02 NOTE — Telephone Encounter (Addendum)
I left patient a message to call me back.  I need to inform her that I have not received anything from her pharmacy regarding prior authorization of Jublia.  I did go ahead and initiate the request via CoverMyMeds.    Patient returned my call.  I informed her that we did not receive anything requesting a prior authorization from the pharmacy.  I did go ahead and initiate the process for authorization.  "Okay, I wasn't aware.  Thank you so much."

## 2015-01-11 DIAGNOSIS — L719 Rosacea, unspecified: Secondary | ICD-10-CM | POA: Diagnosis not present

## 2015-01-11 DIAGNOSIS — L821 Other seborrheic keratosis: Secondary | ICD-10-CM | POA: Diagnosis not present

## 2015-01-11 DIAGNOSIS — Z411 Encounter for cosmetic surgery: Secondary | ICD-10-CM | POA: Diagnosis not present

## 2015-01-11 DIAGNOSIS — L219 Seborrheic dermatitis, unspecified: Secondary | ICD-10-CM | POA: Diagnosis not present

## 2015-01-11 DIAGNOSIS — Z85828 Personal history of other malignant neoplasm of skin: Secondary | ICD-10-CM | POA: Diagnosis not present

## 2015-01-25 ENCOUNTER — Telehealth: Payer: Self-pay | Admitting: *Deleted

## 2015-01-25 NOTE — Telephone Encounter (Signed)
Pt called to speak to Lattie Haw from the [place] office, who was helping with Jublia problem.

## 2015-01-26 DIAGNOSIS — Z961 Presence of intraocular lens: Secondary | ICD-10-CM | POA: Diagnosis not present

## 2015-01-26 DIAGNOSIS — H35372 Puckering of macula, left eye: Secondary | ICD-10-CM | POA: Diagnosis not present

## 2015-01-26 DIAGNOSIS — H4011X2 Primary open-angle glaucoma, moderate stage: Secondary | ICD-10-CM | POA: Diagnosis not present

## 2015-01-26 DIAGNOSIS — H3531 Nonexudative age-related macular degeneration: Secondary | ICD-10-CM | POA: Diagnosis not present

## 2015-01-26 LAB — HM DIABETES EYE EXAM

## 2015-01-26 NOTE — Telephone Encounter (Signed)
"  Are you the one who was working on getting my Jublia authorized?"  Yes, I am.  I tried to submit it again and they told me your insurance had termed.  "What do you mean termed?"  It has terminated.  "It hasn't terminated, I still have it.  I have [facility].  That's what I use for my prescription drugs."  I don't have that information, I had [employer].  "Oh, that's what the problem is."  Can you give me the ID, Group and provider phone number?  "Sure, the ID number is [id number], I don't see a group number and the provider help line is [phone].  I'm so sorry."  Okay, I'll try it again.  "Please let me know about the outcome."  I sure will.

## 2015-01-27 ENCOUNTER — Encounter: Payer: Self-pay | Admitting: Family Medicine

## 2015-01-31 ENCOUNTER — Telehealth: Payer: Self-pay | Admitting: *Deleted

## 2015-01-31 NOTE — Telephone Encounter (Signed)
"  I'm calling to get clarification from authorization request for Jublia that was just entered.  Has she tried and failed either Terbinafine or any other anti-fungal medications?"  He didn't want to prescribe Terbinafine due to health issues and medications that she takes.  She has tried Ciclopirox before.  "When did she start and end this medication and why?"  She started it in 07/2011 and ended in 10/2011.  It was not effective.  She has been using Jublia previously it was covered by her insurance and was effective.  "Okay, thank you, that's all I needed."

## 2015-01-31 NOTE — Telephone Encounter (Signed)
I started authorization process through Cover My Meds for authorization of Jublia from [facility].  Identification number is [id number].  Phone number is [phone].Marland Kitchen

## 2015-02-01 NOTE — Telephone Encounter (Signed)
[employer] denied Jublia coverage.  Patient needs to have tried 2 other anti-fungals for at least 30 days each in order to get it approved such as Sporanox or Lamisil tablets.

## 2015-02-01 NOTE — Telephone Encounter (Signed)
I don't think she wanted to do oral treatment. She can try penlac if she wants. Thanks.

## 2015-02-07 DIAGNOSIS — Z1289 Encounter for screening for malignant neoplasm of other sites: Secondary | ICD-10-CM | POA: Diagnosis not present

## 2015-02-08 ENCOUNTER — Ambulatory Visit: Payer: Medicare Other | Admitting: Podiatry

## 2015-02-13 ENCOUNTER — Ambulatory Visit (INDEPENDENT_AMBULATORY_CARE_PROVIDER_SITE_OTHER): Payer: Medicare Other | Admitting: Podiatry

## 2015-02-13 ENCOUNTER — Encounter: Payer: Self-pay | Admitting: Podiatry

## 2015-02-13 VITALS — BP 138/74 | HR 74 | Resp 18

## 2015-02-13 DIAGNOSIS — Z9889 Other specified postprocedural states: Secondary | ICD-10-CM

## 2015-02-13 DIAGNOSIS — B309 Viral conjunctivitis, unspecified: Secondary | ICD-10-CM | POA: Diagnosis not present

## 2015-02-13 DIAGNOSIS — H1013 Acute atopic conjunctivitis, bilateral: Secondary | ICD-10-CM | POA: Diagnosis not present

## 2015-02-13 DIAGNOSIS — M79676 Pain in unspecified toe(s): Secondary | ICD-10-CM

## 2015-02-13 DIAGNOSIS — B351 Tinea unguium: Secondary | ICD-10-CM | POA: Diagnosis not present

## 2015-02-15 ENCOUNTER — Encounter: Payer: Self-pay | Admitting: Podiatry

## 2015-02-15 NOTE — Progress Notes (Signed)
Patient ID: Tanya Leon, female   DOB: 1941-05-07, 74 y.o.   MRN: [id number]  Subjective: 74 year old female presents the office today for evaluation of bilateral hallux toenails. She states of the left hallux has started to grow out and looks funny and would have it checked. She states of the right toenail hurts somewhat with certain shoes which the very end of the toenail. She denies any redness or drainage on the nail sites and denies any drainage or purulence. She said no history of injury or trauma. She is unable to get Jublia due to insurance reasons. Denies any systemic complaints such as fevers, chills, nausea, vomiting. No acute changes since last appointment, and no other complaints at this time.   Objective: AAO x3, NAD DP/PT pulses palpable bilaterally, CRT less than 3 seconds Protective sensation intact with Simms Weinstein monofilament, vibratory sensation intact, Achilles tendon reflex intact On the left hallux nail that site there is a small amount of nail starting to grow in the proximal nail border. There is hyperkeratotic tissue within the nail bed which was debrided. The right hallux nail is elongated with mild to palpation very distal aspect of the toenail. There is no tenderness on the proximal nail borders. There slight hyperkeratotic tissue within the nail borders. There is no surrounding edema, erythema, increase in warmth. There is no drainage.  Right hallux nails hypertrophic, dystrophic, discolored.   No  other areas of pinpoint bony tenderness or pain with vibratory sensation. MMT 5/5, ROM WNL. No edema, erythema, increase in warmth to bilateral lower extremities.  No open lesions or pre-ulcerative lesions.  No pain with calf compression, swelling, warmth, erythema  Assessment:  74 year old female up evaluation of bilateral hallux nail procedure sites, onychomycosis    Plan: -All treatment options discussed with the patient including all alternatives, risks,  complications.  -Nail procedure sites were debrided of hyperkeratotic tissue. The right hallux toenail is debris without complication/bleeding the patient comfort. After debridement there is resolution of symptoms. Continue to monitor.  -Will appeal the Jublia. A letter was sent to the patient's insurance.  -Follow-up in 3 months, or sooner if needed.  -Patient encouraged to call the office with any questions, concerns, change in symptoms.

## 2015-02-20 ENCOUNTER — Telehealth: Payer: Self-pay | Admitting: *Deleted

## 2015-02-20 NOTE — Telephone Encounter (Signed)
Eldridge Dace called for prior authorization for Jublia.

## 2015-02-21 ENCOUNTER — Telehealth: Payer: Self-pay | Admitting: *Deleted

## 2015-02-21 NOTE — Telephone Encounter (Signed)
"  I'm calling in regards to an appeal that was sent for Jublia.  We need to know if the patient has tried oral Lamisil or Itraconazole?"  No, patient has not tried any of these.  Dr. Jacqualyn Posey does not want to put her on those because of her medical history.  "What conditions does she have?"  She has a history of basal cell carcinoma and colonic polyps.  "Okay, I'll let them know in review."

## 2015-02-21 NOTE — Telephone Encounter (Signed)
I called and left her a message that I was finally able to get prescription authorized.  You should be able to pick it up this afternoon.  Patient's Jublia was authorized from 02/03/2015 to 0503/2017.  Customer ID number is [id number], Event number is [id number].   I faxed the authorization to [place]

## 2015-02-22 ENCOUNTER — Telehealth: Payer: Self-pay | Admitting: Family Medicine

## 2015-02-22 NOTE — Telephone Encounter (Signed)
Quantity Limit Exception denied for zolpidem.  Patient's plan states medication is high risk and plan only allows 90 per 365 days.

## 2015-02-24 NOTE — Telephone Encounter (Signed)
Please inform patient Tanya Leon

## 2015-02-28 NOTE — Telephone Encounter (Signed)
Pt notified and said that fine.

## 2015-03-21 ENCOUNTER — Encounter: Payer: Self-pay | Admitting: *Deleted

## 2015-03-27 ENCOUNTER — Other Ambulatory Visit: Payer: Self-pay

## 2015-03-31 ENCOUNTER — Ambulatory Visit: Payer: Medicare Other | Admitting: Podiatry

## 2015-04-10 ENCOUNTER — Ambulatory Visit (INDEPENDENT_AMBULATORY_CARE_PROVIDER_SITE_OTHER): Payer: Medicare Other | Admitting: Podiatry

## 2015-04-10 DIAGNOSIS — B351 Tinea unguium: Secondary | ICD-10-CM | POA: Diagnosis not present

## 2015-04-10 DIAGNOSIS — M79675 Pain in left toe(s): Secondary | ICD-10-CM | POA: Diagnosis not present

## 2015-04-10 NOTE — Progress Notes (Signed)
Patient ID: Tanya Leon, female   DOB: 30-Aug-1941, 74 y.o.   MRN: [id number]  Subjective: 74 y.o. female returns the office today for painful, elongated, thickened toenails which she cannot trim herself. Denies any redness or drainage around the nails. She also states she had been approved for the Jublia but the cost will still too much for her. She has been applying Vicks to the toenails. Denies any acute changes since last appointment and no new complaints today. Denies any systemic complaints such as fevers, chills, nausea, vomiting.   Objective: AAO 3, NAD DP/PT pulses palpable, CRT less than 3 seconds Protective sensation intact with Simms Weinstein monofilament Nails hypertrophic, dystrophic, elongated, brittle, discolored 9. There is tenderness overlying the nails 2-5 on the left and 1-5 on the right. The left hallux nail had previously been removed. There is evidence of new nail growth along the proximal portion of the nail.  There is no surrounding erythema or drainage along the nail sites. No open lesions or pre-ulcerative lesions are identified. No other areas of tenderness bilateral lower extremities. No overlying edema, erythema, increased warmth. No pain with calf compression, swelling, warmth, erythema.  Assessment: Patient presents with symptomatic onychomycosis  Plan: -Treatment options including alternatives, risks, complications were discussed -Nails sharply debrided 9 without complication/bleeding -Discussed OTC treatments for nail fungus. -Discussed daily foot inspection. If there are any changes, to call the office immediately.  -Follow-up in 3 months or sooner if any problems are to arise. In the meantime, encouraged to call the office with any questions, concerns, changes symptoms.   Celesta Gentile, DPM

## 2015-04-13 ENCOUNTER — Encounter: Payer: Self-pay | Admitting: Internal Medicine

## 2015-04-13 ENCOUNTER — Encounter: Payer: Self-pay | Admitting: Gastroenterology

## 2015-04-18 DIAGNOSIS — H4011X2 Primary open-angle glaucoma, moderate stage: Secondary | ICD-10-CM | POA: Diagnosis not present

## 2015-04-25 DIAGNOSIS — H3530 Unspecified macular degeneration: Secondary | ICD-10-CM | POA: Diagnosis not present

## 2015-04-25 DIAGNOSIS — H5000 Unspecified esotropia: Secondary | ICD-10-CM | POA: Diagnosis not present

## 2015-04-25 DIAGNOSIS — H5213 Myopia, bilateral: Secondary | ICD-10-CM | POA: Diagnosis not present

## 2015-04-25 DIAGNOSIS — Z961 Presence of intraocular lens: Secondary | ICD-10-CM | POA: Diagnosis not present

## 2015-05-03 ENCOUNTER — Ambulatory Visit (INDEPENDENT_AMBULATORY_CARE_PROVIDER_SITE_OTHER)
Admission: RE | Admit: 2015-05-03 | Discharge: 2015-05-03 | Disposition: A | Discharge status: Home / Self Care | Payer: Medicare Other | Source: Ambulatory Visit | Attending: Family Medicine | Admitting: Family Medicine

## 2015-05-03 ENCOUNTER — Ambulatory Visit (INDEPENDENT_AMBULATORY_CARE_PROVIDER_SITE_OTHER): Payer: Medicare Other | Admitting: Family Medicine

## 2015-05-03 ENCOUNTER — Encounter: Payer: Self-pay | Admitting: Family Medicine

## 2015-05-03 VITALS — BP 122/75 | HR 66 | Temp 97.9°F | Ht 61.0 in | Wt 137.0 lb

## 2015-05-03 DIAGNOSIS — S8001XA Contusion of right knee, initial encounter: Secondary | ICD-10-CM

## 2015-05-03 DIAGNOSIS — D699 Hemorrhagic condition, unspecified: Secondary | ICD-10-CM | POA: Diagnosis not present

## 2015-05-03 DIAGNOSIS — S8011XA Contusion of right lower leg, initial encounter: Secondary | ICD-10-CM | POA: Diagnosis not present

## 2015-05-03 DIAGNOSIS — S8000XA Contusion of unspecified knee, initial encounter: Secondary | ICD-10-CM

## 2015-05-03 DIAGNOSIS — S8002XA Contusion of left knee, initial encounter: Secondary | ICD-10-CM

## 2015-05-03 DIAGNOSIS — R233 Spontaneous ecchymoses: Secondary | ICD-10-CM

## 2015-05-03 DIAGNOSIS — D696 Thrombocytopenia, unspecified: Secondary | ICD-10-CM | POA: Insufficient documentation

## 2015-05-03 LAB — CBC WITH DIFFERENTIAL/PLATELET
BASOS ABS: 0 10*3/uL (ref 0.0–0.1)
Basophils Relative: 0.6 % (ref 0.0–3.0)
EOS ABS: 0.1 10*3/uL (ref 0.0–0.7)
Eosinophils Relative: 1.7 % (ref 0.0–5.0)
HEMATOCRIT: 41 % (ref 36.0–46.0)
HEMOGLOBIN: 13.7 g/dL (ref 12.0–15.0)
LYMPHS ABS: 1.8 10*3/uL (ref 0.7–4.0)
Lymphocytes Relative: 27.5 % (ref 12.0–46.0)
MCHC: 33.5 g/dL (ref 30.0–36.0)
MCV: 92.4 fl (ref 78.0–100.0)
MONOS PCT: 6.5 % (ref 3.0–12.0)
Monocytes Absolute: 0.4 10*3/uL (ref 0.1–1.0)
NEUTROS PCT: 63.7 % (ref 43.0–77.0)
Neutro Abs: 4.2 10*3/uL (ref 1.4–7.7)
PLATELETS: 163 10*3/uL (ref 150.0–400.0)
RBC: 4.43 Mil/uL (ref 3.87–5.11)
RDW: 13.8 % (ref 11.5–15.5)
WBC: 6.5 10*3/uL (ref 4.0–10.5)

## 2015-05-03 MED ORDER — TRAMADOL HCL 50 MG PO TABS: 50.0000 mg | ORAL_TABLET | Freq: Four times a day (QID) | ORAL | Status: DC | PRN

## 2015-05-03 NOTE — Progress Notes (Signed)
   Subjective:    Patient ID: Tanya Leon, female    DOB: [birth year], 74 y.o.   MRN: [id number]  HPI Here for injuries from a fall on 04-27-15 when she tripped over a grocery cart in a grocery store. She bruised both knees and especially bruised the right shin. She initially iced the shin and wrapped it with an Ace wrap, but not now. The area is painful to her. Of note her platelet count has slowly been dropping the past few years, and it was 136 K last year.    Review of Systems  Constitutional: Negative.   Respiratory: Negative.   Cardiovascular: Negative.   Musculoskeletal: Positive for joint swelling and arthralgias.  Skin: Positive for wound.  Neurological: Negative.        Objective:   Physical Exam  Constitutional: She is oriented to person, place, and time. She appears well-developed and well-nourished.  She walks easily   Cardiovascular: Normal rate, regular rhythm, normal heart sounds and intact distal pulses.   Pulmonary/Chest: Effort normal and breath sounds normal.  Musculoskeletal:  There is a large hematoma over the right anterior lower leg which is tender. There are ecchymoses below this down to the foot. She also has a large ecchymosis over the right forearm. Her knees are slightly tender and the right anterior knee as a small ecchymosis. ROM is full  Neurological: She is alert and oriented to person, place, and time.          Assessment & Plan:  She has a large hematoma on the shin from a recent injury, and a low platelet count my be contributing to this. We will check a CBC today. She will stay off her feet and elevate the leg. Use Tramadol prn pain. Get Xrays of the right tibia and bot knees

## 2015-05-03 NOTE — Progress Notes (Signed)
Pre visit review using our clinic review tool, if applicable. No additional management support is needed unless otherwise documented below in the visit note. 

## 2015-05-05 ENCOUNTER — Telehealth: Payer: Self-pay | Admitting: Family Medicine

## 2015-05-05 NOTE — Telephone Encounter (Signed)
Pt would like those results before dr fry returns, and advise d dr hunter back Monday, but again pt states she would like to know before Monday. Pt says  Ok to put on [employer]

## 2015-05-05 NOTE — Telephone Encounter (Signed)
Pt would like blood work result and xray result. Pt is aware md out of office

## 2015-05-08 ENCOUNTER — Encounter: Payer: Self-pay | Admitting: Family Medicine

## 2015-05-08 NOTE — Telephone Encounter (Signed)
Dr. Sarajane Jews is out of the next until next week. Pt would like results today.

## 2015-05-08 NOTE — Telephone Encounter (Signed)
Called and lm for pt to return call.

## 2015-05-08 NOTE — Telephone Encounter (Signed)
Dr. Yong Channel do you mind reviewing results and releasing them to pt on mychart?

## 2015-05-08 NOTE — Telephone Encounter (Signed)
Patient called and would like to know her results. Patients states she would like the results today.

## 2015-05-08 NOTE — Telephone Encounter (Signed)
I reviewed them and believed I released them. Can you check with patient and make sure she can see results? Thanks

## 2015-05-08 NOTE — Telephone Encounter (Signed)
Once results have been reviewed since Dr. Sarajane Jews was the ordering provider Sunday Spillers please call pt once Dr. Sarajane Jews has gotten results back to you, thanks.

## 2015-05-09 NOTE — Telephone Encounter (Signed)
Pt read and saw message on 05/08/15 at 12:55pm

## 2015-05-30 ENCOUNTER — Encounter: Payer: Self-pay | Admitting: Family Medicine

## 2015-05-30 ENCOUNTER — Ambulatory Visit (INDEPENDENT_AMBULATORY_CARE_PROVIDER_SITE_OTHER): Payer: Medicare Other | Admitting: Family Medicine

## 2015-05-30 VITALS — BP 122/80 | HR 63 | Temp 97.7°F | Ht 61.0 in | Wt 136.0 lb

## 2015-05-30 DIAGNOSIS — F32A Depression, unspecified: Secondary | ICD-10-CM

## 2015-05-30 DIAGNOSIS — F329 Major depressive disorder, single episode, unspecified: Secondary | ICD-10-CM | POA: Diagnosis not present

## 2015-05-30 DIAGNOSIS — Z23 Encounter for immunization: Secondary | ICD-10-CM

## 2015-05-30 DIAGNOSIS — Z79899 Other long term (current) drug therapy: Secondary | ICD-10-CM

## 2015-05-30 DIAGNOSIS — Z0000 Encounter for general adult medical examination without abnormal findings: Secondary | ICD-10-CM | POA: Diagnosis not present

## 2015-05-30 LAB — COMPREHENSIVE METABOLIC PANEL
ALK PHOS: 61 U/L (ref 39–117)
ALT: 13 U/L (ref 0–35)
AST: 17 U/L (ref 0–37)
Albumin: 4.3 g/dL (ref 3.5–5.2)
BUN: 13 mg/dL (ref 6–23)
CALCIUM: 9.2 mg/dL (ref 8.4–10.5)
CHLORIDE: 103 meq/L (ref 96–112)
CO2: 29 mEq/L (ref 19–32)
CREATININE: 0.72 mg/dL (ref 0.40–1.20)
GFR: 83.82 mL/min (ref >60.00)
Glucose, Bld: 87 mg/dL (ref 70–99)
POTASSIUM: 4 meq/L (ref 3.5–5.1)
Sodium: 139 mEq/L (ref 135–145)
TOTAL PROTEIN: 6.9 g/dL (ref 6.0–8.3)
Total Bilirubin: 0.8 mg/dL (ref 0.2–1.2)

## 2015-05-30 MED ORDER — PREGABALIN 50 MG PO CAPS: 50.0000 mg | ORAL_CAPSULE | Freq: Two times a day (BID) | ORAL | Status: DC

## 2015-05-30 NOTE — Patient Instructions (Signed)
  Ms. Jasmer , Thank you for taking time to come for your Medicare Wellness Visit. I appreciate your ongoing commitment to your health goals. Please review the following plan we discussed and let me know if I can assist you in the future.   These are the goals we discussed: Maintain weight (or could consider losing 5 lbs) continue your exercise Labs before you leave- blood sugar, kidney, liver  This is a list of the screening recommended for you and due dates:  Health Maintenance  Topic Date Due  . Flu Shot -in the fall 07/12/2015*  . Colon Cancer Screening  05/27/2017  . Tetanus Vaccine  08/01/2020  . DEXA scan (bone density measurement)  Completed  . Shingles Vaccine  Completed  . Pneumonia vaccines  Completed  *Topic was postponed. The date shown is not the original due date.  Breast and pelvic exam through gyn

## 2015-05-30 NOTE — Progress Notes (Signed)
Tanya Reddish, MD Phone: [phone]  Subjective:  Patient presents today for their annual wellness visit.    Preventive Screening-Counseling & Management  Smoking Status: Never Smoker Second Hand Smoking status: No smokers in home  Risk Factors Regular exercise: step aerobics 3 days a week Diet: reasonable Fall Risk: None, mechanical fall at grocery store with a cart being low in peripheral vision   Cardiac risk factors:  advanced age  Hyperlipidemia - none No diabetes.  Family History: heart attack at 64 in mother   Depression Screen None. PHQ2 0   Activities of Daily Living Independent ADLs and IADLs   Hearing Difficulties: -patient declines, maybe not as good as used to be- volume up some with movies  Cognitive Testing No reported trouble.   Normal 3 word recall  List the Names of Other Physician/Practitioners you currently use: 1.Jari Pigg, Dermatology 2. Optho Dr. Herbert Deaner 3. Ortho at [facility] retired, will be getting new provider 4. [facility]- cannot recall name  Immunization History  Administered Date(s) Administered  . H1N1 09/13/2008  . Influenza Whole 06/24/2008, 06/26/2009, 08/01/2010  . Influenza, Seasonal, Injecte, Preservative Fre 07/06/2013  . Influenza,inj,Quad PF,36+ Mos 07/07/2014  . Pneumococcal Conjugate-13 05/30/2015  . Pneumococcal Polysaccharide-23 12/30/2005  . Td 08/01/2010  . Zoster 10/08/2007   Required Immunizations needed today: prevnar 15 today, returns for flu  Screening tests- up to date There are no preventive care reminders to display for this patient. ROS- No pertinent positives discovered in course of AWV  The following were reviewed and entered/updated in epic: Past Medical History  Diagnosis Date  . Osteopenia   . Constipation     opiod induced associated with back pain  . Low back pain   . Lumbar spondylolysis   . Arthritis   . Depression   . Diverticulosis   . Colon  polyps     adenomatous  . Heart murmur     benign, dagnosed around age 66,prophylactic antibiotic  . DIVERTICULOSIS, COLON 01/13/2009   Patient Active Problem List   Diagnosis Date Noted  . Basal cell carcinoma 07/07/2014    Priority: Medium  . Depression 06/01/2014    Priority: Medium  . INSOMNIA, CHRONIC 07/22/2007    Priority: Medium  . Thrombocytopenia 05/03/2015    Priority: Low  . 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 Glaucoma 06/01/2014    Priority: Low  . 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 COLONIC POLYPS 01/13/2009    Priority: Low  . 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 SPINAL STENOSIS, LUMBAR 01/25/2008    Priority: Low  . ALLERGIC RHINITIS 05/21/2007    Priority: Low  . Osteopenia 02/26/2007    Priority: Low   Past Surgical History  Procedure Laterality Date  . Tonsillectomly    . Abdominal hysterectomy      with ovaries  . Perforated deverticular abscess/laporotomy and colostomy      depression after this  . Take down of colostomy    . Lumbar fusion      L4-L5  . Abdominal hernia repair Right     incisional  . Incisional ab  05/28/11  . Partial knee arthroplasty Right   . Eye muscle surgery    . Partial knee arthroplasty Left   . Stribismus eye surgery Right   . Glaucoma surgery Left 05/12/14  . Cataract left lens replacement  2016    Family History  Problem Relation Age of Onset  . Alzheimer's disease Father   . Heart disease Father     mitral valve replaced. unknown reason  . Breast cancer Mother     53  . Prostate cancer Brother   . Colon polyps Mother   . Diabetes Mother   . Heart disease Mother 59    CHF, MI 37  . Heart attack Mother     x 2    Medications- reviewed and updated Current Outpatient Prescriptions  Medication Sig Dispense Refill  . AMBULATORY NON FORMULARY MEDICATION Evening Primerose Oil 1300 mg take 1 by mouth twice a day    .  aspirin-acetaminophen-caffeine (EXCEDRIN EXTRA STRENGTH) 250-250-65 MG per tablet Take 2 tablets by mouth every 6 (six) hours as needed.     Marland Kitchen azithromycin (AZASITE) 1 % ophthalmic solution Place 1 drop into both eyes every 30 (thirty) days. One week per month    . Calcium-Magnesium 500-250 MG TABS Take 1 tablet by mouth 2 (two) times daily.    . carboxymethylcellulose (REFRESH PLUS) 0.5 % SOLN Place 1 drop into both eyes as needed.    . Cholecalciferol (VITAMIN D) 1000 UNITS capsule Take 1,000 Units by mouth daily.     . Ciclopirox 1 % shampoo 2 (two) times a week.     . cycloSPORINE (RESTASIS) 0.05 % ophthalmic emulsion Apply to eye.    . DULoxetine (CYMBALTA) 60 MG capsule Take 1 capsule (60 mg total) by mouth daily. 90 capsule 3  . Efinaconazole 10 % SOLN Apply 1 drop topically daily. 8 mL 11  . estradiol (VIVELLE-DOT) 0.05 MG/24HR Place 1 patch onto the skin. 2 times a week     . fluticasone (FLONASE) 50 MCG/ACT nasal spray Use 2 sprays in each  nostril daily 48 g 5  . Lutein 20 MG TABS Take 1 tablet by mouth daily.    . Multiple Vitamins-Minerals (MULTIVITAMIN PO) Take 1 tablet by mouth daily.    . Olopatadine HCl (PATADAY) 0.2 % SOLN Place 1 drop into both eyes daily.     Marland Kitchen omega-3 acid ethyl esters (LOVAZA) 1 G capsule Take 2 g by mouth daily.      . polyethylene glycol powder (GLYCOLAX/MIRALAX) powder Take 17 g by mouth once.    . pregabalin (LYRICA) 50 MG capsule Take 1 capsule (50 mg total) by mouth 2 (two) times daily. 180 capsule 3  . Probiotic Product (ALIGN) 4 MG CAPS Take by mouth daily.      Marland Kitchen tiZANidine (ZANAFLEX) 2 MG tablet Take 1 tablet (2 mg total) by mouth at bedtime. 30 tablet 5  . vitamin C (ASCORBIC ACID) 500 MG tablet Take 500 mg by mouth daily.    Marland Kitchen zolpidem (AMBIEN) 5 MG tablet Take 1 tablet (5 mg total) by mouth at bedtime as needed. (Patient not taking: Reported on 05/30/2015) 30 tablet 5   No current facility-administered medications for this visit.     Allergies-reviewed and updated Allergies  Allergen Reactions  . Shellfish Allergy Swelling    Social History   Social History  . Marital Status: Married    Spouse Name: N/A  . Number of Children: 2  . Years of Education: N/A   Occupational History  . cpa/retired   .     Social History Main Topics  . Smoking status: Never Smoker   . Smokeless tobacco: Never Used  . Alcohol Use: 0.0 oz/week    0 Standard drinks  or equivalent per week     Comment: 1 per day  . Drug Use: No  . Sexual Activity: Yes   Other Topics Concern  . Not on file   Social History Narrative   Married 43 years in 2015. 2 kids. 4 grandkids.       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 Previously [employer].     Objective: BP 122/80 mmHg  Pulse 63  Temp(Src) 97.7 F (36.5 C)  Ht 5\' 1"  (1.549 m)  Wt 136 lb (61.689 kg)  BMI 25.71 kg/m2 Gen: NAD, resting comfortably HEENT: Mucous membranes are moist. Oropharynx normal CV: RRR no murmurs rubs or gallops Lungs: CTAB no crackles, wheeze, rhonchi Abdomen: soft/nontender/nondistended/normal bowel sounds. No rebound or guarding.  Ext: no edema Skin: warm, dry, no rash Neuro: grossly normal, moves all extremities, PERRLA  Assessment/Plan:  AWV completed Spinal stenosis stable on decreased lyrica to 50mg  BID (refilled) Insomnia stable on 5mg  on ambien Continue yearly skin checks with Dr. Delman Cheadle Thrombocytopenia not noted on last CBC Continuing yearly GYN for breast/pelvic exam Return for flu in october  Orders Placed This Encounter  Procedures  . Pneumococcal conjugate vaccine 13-valent  . Comprehensive metabolic panel    Start    Meds ordered this encounter  Medications  . pregabalin (LYRICA) 50 MG capsule    Sig: Take 1 capsule (50 mg total) by mouth 2 (two) times daily.    Dispense:  180 capsule    Refill:  3   6 months check in, 12 months AWV

## 2015-07-05 DIAGNOSIS — L309 Dermatitis, unspecified: Secondary | ICD-10-CM | POA: Diagnosis not present

## 2015-07-11 ENCOUNTER — Encounter: Payer: Self-pay | Admitting: Podiatry

## 2015-07-11 ENCOUNTER — Ambulatory Visit (INDEPENDENT_AMBULATORY_CARE_PROVIDER_SITE_OTHER): Payer: Medicare Other | Admitting: Family Medicine

## 2015-07-11 ENCOUNTER — Ambulatory Visit (INDEPENDENT_AMBULATORY_CARE_PROVIDER_SITE_OTHER): Payer: Medicare Other | Admitting: Podiatry

## 2015-07-11 DIAGNOSIS — B351 Tinea unguium: Secondary | ICD-10-CM

## 2015-07-11 DIAGNOSIS — M79675 Pain in left toe(s): Secondary | ICD-10-CM | POA: Diagnosis not present

## 2015-07-11 DIAGNOSIS — Z23 Encounter for immunization: Secondary | ICD-10-CM

## 2015-07-11 NOTE — Progress Notes (Signed)
Patient ID: Tanya Leon, female   DOB: 1941/06/30, 74 y.o.   MRN: [id number]  Subjective: 74 y.o. female returns the office today for painful, elongated, thickened toenails which she cannot trim herself. She also states the nails continue to be thick, discolored. She states the left hallux toenail is not growing properly. She states it is growing thick. She denies any redness or drainage all nail sites. She has irritation to the nails in shoe gear. She does continue with Vicks vapor rub for the toenail fungus. She did also buy over-the-counter treatments she has not started as she does not think that she will be consistent with the treatment.  Denies any acute changes since last appointment and no new complaints today. Denies any systemic complaints such as fevers, chills, nausea, vomiting.   Objective: AAO 3, NAD DP/PT pulses palpable, CRT less than 3 seconds Protective sensation intact with Simms Weinstein monofilament Nails hypertrophic, dystrophic, elongated, brittle, discolored 10. There is tenderness overlying the nails 1-5 bilaterally. The left hallux nail  Starting to regrow but it is also thick and discolored..  There is no surrounding erythema or drainage along the nail sites. No open lesions or pre-ulcerative lesions are identified. No other areas of tenderness bilateral lower extremities. No overlying edema, erythema, increased warmth. No pain with calf compression, swelling, warmth, erythema.  Assessment: Patient presents with symptomatic onychomycosis  Plan: -Treatment options including alternatives, risks, complications were discussed -Nails sharply debrided 10 without complication/bleeding -Discussed OTC treatments for nail fungus. She will stat this as she does not want any prescription medications.  -Discussed daily foot inspection. If there are any changes, to call the office immediately.  -Follow-up in 3 months or sooner if any problems are to arise. In the meantime,  encouraged to call the office with any questions, concerns, changes symptoms.  Celesta Gentile, DPM

## 2015-07-13 ENCOUNTER — Telehealth: Payer: Self-pay | Admitting: *Deleted

## 2015-07-13 NOTE — Telephone Encounter (Signed)
Formula 3 can be used for nail fungus. She has nail fungus.

## 2015-07-13 NOTE — Telephone Encounter (Addendum)
Pt asked what kind of fungal infection does she have that Dr. Jacqualyn Posey would suggest Formula 3, that says it treats athletes foot and ringworm?  I left message with pt's husband, [name] to inform pt the Formula 3 is used by our office for toenail fungus, because it better penetrates the toenails, it just takes longer then the oral anti-fungal medications, and FDA has not approved for the toenails, but does treat athletes foot which is a fungus also.  [name] states he will inform pt once she's out of the shower.

## 2015-07-14 ENCOUNTER — Ambulatory Visit: Payer: Medicare Other | Admitting: Podiatry

## 2015-07-18 ENCOUNTER — Ambulatory Visit: Payer: Medicare Other | Admitting: Family Medicine

## 2015-07-21 ENCOUNTER — Other Ambulatory Visit: Payer: Self-pay | Admitting: Family Medicine

## 2015-07-21 ENCOUNTER — Telehealth: Payer: Self-pay

## 2015-07-21 NOTE — Telephone Encounter (Signed)
See below

## 2015-07-21 NOTE — Telephone Encounter (Signed)
Error

## 2015-07-21 NOTE — Telephone Encounter (Signed)
Yes thanks, not sure why this was sent back to me. Fill medicine- does not need to be sent to me again

## 2015-07-21 NOTE — Telephone Encounter (Signed)
Requesting refill on Zolpidem (Ambien) 5 mg tablet # 30 with zero refills -- okay to refill?   Last seen: 05/30/2015 Last refill: 11/30/14 #30 with 5 refills

## 2015-07-21 NOTE — Telephone Encounter (Signed)
Yes thanks 

## 2015-07-26 ENCOUNTER — Telehealth: Payer: Self-pay | Admitting: Family Medicine

## 2015-07-26 MED ORDER — DOXEPIN HCL 6 MG PO TABS: 0.5000 | ORAL_TABLET | Freq: Every evening | ORAL | Status: DC | PRN

## 2015-07-26 NOTE — Telephone Encounter (Signed)
Per [employer], patient must try and fail Rozerem AND Silenor or rationale why neither drug is appropriate for the patient.

## 2015-07-26 NOTE — Telephone Encounter (Signed)
Keba please call patient and tell her that her Lorrin Mais was not approved. Will not be approved until she trials and fails 2 other medicines. I sent in [place] for her to take

## 2015-07-26 NOTE — Telephone Encounter (Signed)
Sent mychart message with below info.

## 2015-08-29 DIAGNOSIS — B351 Tinea unguium: Secondary | ICD-10-CM

## 2015-10-04 DIAGNOSIS — Z96652 Presence of left artificial knee joint: Secondary | ICD-10-CM | POA: Diagnosis not present

## 2015-10-04 DIAGNOSIS — M25561 Pain in right knee: Secondary | ICD-10-CM | POA: Diagnosis not present

## 2015-10-04 DIAGNOSIS — M1712 Unilateral primary osteoarthritis, left knee: Secondary | ICD-10-CM | POA: Diagnosis not present

## 2015-10-04 DIAGNOSIS — M25562 Pain in left knee: Secondary | ICD-10-CM | POA: Diagnosis not present

## 2015-10-05 DIAGNOSIS — B351 Tinea unguium: Secondary | ICD-10-CM

## 2015-10-16 ENCOUNTER — Ambulatory Visit (INDEPENDENT_AMBULATORY_CARE_PROVIDER_SITE_OTHER): Payer: Medicare Other | Admitting: Podiatry

## 2015-10-16 ENCOUNTER — Encounter: Payer: Self-pay | Admitting: Podiatry

## 2015-10-16 DIAGNOSIS — B351 Tinea unguium: Secondary | ICD-10-CM | POA: Diagnosis not present

## 2015-10-16 DIAGNOSIS — M79675 Pain in left toe(s): Secondary | ICD-10-CM | POA: Diagnosis not present

## 2015-10-16 NOTE — Progress Notes (Signed)
Patient ID: RIFKY KASTOR, female   DOB: 02-23-1941, 75 y.o.   MRN: [id number]  Subjective: 75 y.o. female returns the office today for painful, elongated, thickened toenails which she cannot trim herself. She also states the nails continue to be thick, discolored. She is using formula 3. The nails do get painful, mostly with shoegear. Denies any acute changes since last appointment and no new complaints today. Denies any systemic complaints such as fevers, chills, nausea, vomiting.   Objective: AAO 3, NAD DP/PT pulses palpable, CRT less than 3 seconds Protective sensation intact with Simms Weinstein monofilament Nails hypertrophic, dystrophic, elongated, brittle, discolored 10. There is tenderness overlying the nails 1-5 bilaterally. The left hallux nail is growing in slowly. There is no surrounding erythema or drainage along the nail sites. No open lesions or pre-ulcerative lesions are identified. No other areas of tenderness bilateral lower extremities. No overlying edema, erythema, increased warmth. No pain with calf compression, swelling, warmth, erythema.  Assessment: Patient presents with symptomatic onychomycosis  Plan: -Treatment options including alternatives, risks, complications were discussed -Nails sharply debrided 10 without complication/bleeding - Again had a long discussion with the patient regarding treatment options for onychomycosis. We discussed Lamisil however she wishes to hold off. Continue with over-the-counter treatment, formula 3. Can add urea. If no improvement next appointment, may try lamisil. -Discussed daily foot inspection. If there are any changes, to call the office immediately.  -Follow-up in 3 months or sooner if any problems are to arise. In the meantime, encouraged to call the office with any questions, concerns, changes symptoms.  Celesta Gentile, DPM

## 2015-11-01 DIAGNOSIS — M5136 Other intervertebral disc degeneration, lumbar region: Secondary | ICD-10-CM | POA: Diagnosis not present

## 2015-11-03 ENCOUNTER — Encounter: Payer: Self-pay | Admitting: Family Medicine

## 2015-11-03 ENCOUNTER — Ambulatory Visit (INDEPENDENT_AMBULATORY_CARE_PROVIDER_SITE_OTHER): Payer: Medicare Other | Admitting: Family Medicine

## 2015-11-03 VITALS — BP 100/72 | HR 58 | Temp 98.2°F | Wt 135.0 lb

## 2015-11-03 DIAGNOSIS — G4700 Insomnia, unspecified: Secondary | ICD-10-CM

## 2015-11-03 DIAGNOSIS — F329 Major depressive disorder, single episode, unspecified: Secondary | ICD-10-CM | POA: Diagnosis not present

## 2015-11-03 DIAGNOSIS — M4806 Spinal stenosis, lumbar region: Secondary | ICD-10-CM | POA: Diagnosis not present

## 2015-11-03 DIAGNOSIS — M1712 Unilateral primary osteoarthritis, left knee: Secondary | ICD-10-CM

## 2015-11-03 DIAGNOSIS — M48061 Spinal stenosis, lumbar region without neurogenic claudication: Secondary | ICD-10-CM

## 2015-11-03 DIAGNOSIS — F32A Depression, unspecified: Secondary | ICD-10-CM

## 2015-11-03 MED ORDER — RAMELTEON 8 MG PO TABS: 8.0000 mg | ORAL_TABLET | Freq: Every day | ORAL | Status: DC

## 2015-11-03 NOTE — Assessment & Plan Note (Signed)
S: patient continues to have well controlled back pain for most part on lyrica 50mg  BID. She did have a recent visit with Dr. Trenton Gammon of neurosurgery but apparently no obvious worsening on plain films. She had slight increase in pain at that time but improving again A/P: continue lyrica 50mg  BID. Has zanaflex for prn use

## 2015-11-03 NOTE — Patient Instructions (Signed)
Trial rozerem for insomnia. If this is not effective within a month, please let me know. We would need to submit prior authorization for ambien in that case.   Glad Lyrica is still helping- continue current dose  Best of luck with your knee replacement-I know you are in good hands. I do not see any medical reason not to proceed with surgery

## 2015-11-03 NOTE — Assessment & Plan Note (Signed)
S: previously well controlled on ambien. Now requires prior auth. We trialed silenor/doxepin. With silenor gets intermittent sleep most nights. Does not seem much improved from her baseline. Rarely she will get a full nights rest but that happened occasionally without medicine as well. Has not tried rozerem which is also required before using ambien A/P: we will trial rozerem- update me a month after to let me know if effective or not. If not effective, submit PA for Tanya Leon- otherwise continue current medicine

## 2015-11-03 NOTE — Progress Notes (Signed)
Garret Reddish, MD  Subjective:  Tanya Leon is a 75 y.o. year old very pleasant female patient who presents for/with See problem oriented charting ROS- No chest pain or shortness of breath at rest or with activity including up to 4 METS. No headache or blurry vision. Denies leg weakness.   Past Medical History-  Patient Active Problem List   Diagnosis Date Noted  . Basal cell carcinoma 07/07/2014    Priority: Medium  . Depression 06/01/2014    Priority: Medium  . SPINAL STENOSIS, LUMBAR 01/25/2008    Priority: Medium  . INSOMNIA, CHRONIC 07/22/2007    Priority: Medium  . Thrombocytopenia ([place]) 05/03/2015    Priority: Low  . 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 Glaucoma 06/01/2014    Priority: Low  . 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 COLONIC POLYPS 01/13/2009    Priority: Low  . 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 ALLERGIC RHINITIS 05/21/2007    Priority: Low  . Osteopenia 02/26/2007    Priority: Low    Medications- reviewed and updated Current Outpatient Prescriptions  Medication Sig Dispense Refill  . AMBULATORY NON FORMULARY MEDICATION Evening Primerose Oil 1300 mg take 1 by mouth twice a day    . azithromycin (AZASITE) 1 % ophthalmic solution Place 1 drop into both eyes every 30 (thirty) days. One week per month    . carboxymethylcellulose (REFRESH PLUS) 0.5 % SOLN Place 1 drop into both eyes daily as needed (Dry eyes.).     Marland Kitchen Cholecalciferol (VITAMIN D) 1000 UNITS capsule Take 1,000 Units by mouth every evening.     . Ciclopirox 1 % shampoo Apply 1 each topically every Friday.     . cycloSPORINE (RESTASIS) 0.05 % ophthalmic emulsion Place 1 drop into both eyes 2 (two) times daily.     . DULoxetine (CYMBALTA) 60 MG capsule Take 1 capsule (60 mg total) by mouth daily. 90 capsule 3  . Efinaconazole 10 % SOLN Apply 1 drop topically daily. 8 mL 11  . fluticasone (FLONASE) 50 MCG/ACT nasal  spray Use 2 sprays in each  nostril daily 48 g 5  . Lutein 20 MG TABS Take 1 tablet by mouth every evening.     . Multiple Vitamins-Minerals (ICAPS AREDS 2) CAPS Take 1 capsule by mouth 2 (two) times daily.    . Multiple Vitamins-Minerals (MULTIVITAMIN PO) Take 1 tablet by mouth every morning.     . Olopatadine HCl (PATADAY) 0.2 % SOLN Place 1 drop into both eyes daily as needed (dry/itchy eyes.).     Marland Kitchen omega-3 acid ethyl esters (LOVAZA) 1 G capsule Take 1 g by mouth 2 (two) times daily.     . polyethylene glycol powder (GLYCOLAX/MIRALAX) powder Take 17 g by mouth every morning.     . pregabalin (LYRICA) 50 MG capsule Take 1 capsule (50 mg total) by mouth 2 (two) times daily. 180 capsule 3  . Probiotic Product (ALIGN) 4 MG CAPS Take 1 capsule by mouth every morning.     Marland Kitchen aspirin-acetaminophen-caffeine (EXCEDRIN EXTRA STRENGTH) 250-250-65 MG per tablet Take 2 tablets by mouth every 6 (six) hours as needed for headache. Reported on 11/03/2015    . estradiol (VIVELLE-DOT) 0.05 MG/24HR Place 1 patch onto the skin 2 (two) times a week. Reported on 11/03/2015    . ramelteon (ROZEREM) 8 MG tablet Take 1 tablet (8 mg total) by mouth at bedtime.  30 tablet 3  . tiZANidine (ZANAFLEX) 2 MG tablet Take 1 tablet (2 mg total) by mouth at bedtime. (Patient not taking: Reported on 11/03/2015) 30 tablet 5   No current facility-administered medications for this visit.    Objective: BP 100/72 mmHg  Pulse 58  Temp(Src) 98.2 F (36.8 C)  Wt 135 lb (61.236 kg) Gen: NAD, resting comfortably CV: RRR no murmurs rubs or gallops Lungs: CTAB no crackles, wheeze, rhonchi Abdomen: soft/nontender/nondistended/normal bowel sounds. No rebound or guarding.  Ext: no edema Skin: warm, dry Neuro: grossly normal, moves all extremities  Assessment/Plan:  INSOMNIA, CHRONIC S: previously well controlled on ambien. Now requires prior auth. We trialed silenor/doxepin. With silenor gets intermittent sleep most nights. Does not seem  much improved from her baseline. Rarely she will get a full nights rest but that happened occasionally without medicine as well. Has not tried rozerem which is also required before using ambien A/P: we will trial rozerem- update me a month after to let me know if effective or not. If not effective, submit PA for Lorrin Mais- otherwise continue current medicine   Depression S: patient denies anhedonia or depressed mood on cymbalta. No SI/HI A/P: continue current medication- doing well   SPINAL STENOSIS, LUMBAR S: patient continues to have well controlled back pain for most part on lyrica 50mg  BID. She did have a recent visit with Dr. Trenton Gammon of neurosurgery but apparently no obvious worsening on plain films. She had slight increase in pain at that time but improving again A/P: continue lyrica 50mg  BID. 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 Patient does not have hypertension or hyperlipidemia. Nonsmoker and no diabetes. She can complete 4 METS without difficulty except from her knee at times.  Recent labs last year showed resolution of prior thrombocytopenia A/P: patient is medically maximized to proceed with surgery.    Return in about 6 months (around 05/02/2016) for annual wellness visit. Return precautions advised.   Meds ordered this encounter  Medications  . ramelteon (ROZEREM) 8 MG tablet    Sig: Take 1 tablet (8 mg total) by mouth at bedtime.    Dispense:  30 tablet    Refill:  3

## 2015-11-03 NOTE — Assessment & Plan Note (Signed)
S: patient denies anhedonia or depressed mood on cymbalta. No SI/HI A/P: continue current medication- doing well

## 2015-11-13 ENCOUNTER — Other Ambulatory Visit (HOSPITAL_COMMUNITY): Payer: Self-pay | Admitting: Orthopaedic Surgery

## 2015-11-15 ENCOUNTER — Encounter (HOSPITAL_COMMUNITY)
Admission: RE | Admit: 2015-11-15 | Discharge: 2015-11-15 | Disposition: A | Discharge status: Home / Self Care | Payer: Medicare Other | Source: Ambulatory Visit | Attending: Orthopaedic Surgery | Admitting: Orthopaedic Surgery

## 2015-11-15 ENCOUNTER — Encounter (HOSPITAL_COMMUNITY): Payer: Self-pay

## 2015-11-15 DIAGNOSIS — M1712 Unilateral primary osteoarthritis, left knee: Secondary | ICD-10-CM | POA: Diagnosis not present

## 2015-11-15 DIAGNOSIS — Z01812 Encounter for preprocedural laboratory examination: Secondary | ICD-10-CM | POA: Insufficient documentation

## 2015-11-15 DIAGNOSIS — Z0183 Encounter for blood typing: Secondary | ICD-10-CM | POA: Insufficient documentation

## 2015-11-15 HISTORY — DX: Adverse effect of unspecified anesthetic, initial encounter: T41.45XA

## 2015-11-15 HISTORY — DX: Malignant (primary) neoplasm, unspecified: C80.1

## 2015-11-15 HISTORY — DX: Other complications of anesthesia, initial encounter: T88.59XA

## 2015-11-15 HISTORY — DX: Pneumonia, unspecified organism: J18.9

## 2015-11-15 LAB — CBC
HCT: 42 % (ref 36.0–46.0)
Hemoglobin: 13.6 g/dL (ref 12.0–15.0)
MCH: 30.4 pg (ref 26.0–34.0)
MCHC: 32.4 g/dL (ref 30.0–36.0)
MCV: 93.8 fL (ref 78.0–100.0)
Platelets: 146 10*3/uL — ABNORMAL LOW (ref 150–400)
RBC: 4.48 MIL/uL (ref 3.87–5.11)
RDW: 13.5 % (ref 11.5–15.5)
WBC: 4.5 10*3/uL (ref 4.0–10.5)

## 2015-11-15 LAB — BASIC METABOLIC PANEL
Anion gap: 8 (ref 5–15)
BUN: 21 mg/dL — AB (ref 6–20)
CO2: 26 mmol/L (ref 22–32)
CREATININE: 0.81 mg/dL (ref 0.44–1.00)
Calcium: 9.1 mg/dL (ref 8.9–10.3)
Chloride: 107 mmol/L (ref 101–111)
GFR calc Af Amer: >60 mL/min (ref >60)
Glucose, Bld: 105 mg/dL — ABNORMAL HIGH (ref 65–99)
Potassium: 4.3 mmol/L (ref 3.5–5.1)
SODIUM: 141 mmol/L (ref 135–145)

## 2015-11-15 LAB — ABO/RH: ABO/RH(D): B POS

## 2015-11-15 LAB — SURGICAL PCR SCREEN
MRSA, PCR: NEGATIVE
STAPHYLOCOCCUS AUREUS: NEGATIVE

## 2015-11-15 NOTE — Patient Instructions (Signed)
Tanya Leon  11/15/2015   Your procedure is scheduled on: 11/24/2015    Report to [facility]  Entrance take [place]  elevators to 3rd floor to  [place] at    [address].  Call this number if you have problems the morning of surgery [phone]   Remember: ONLY 1 PERSON MAY GO WITH YOU TO SHORT STAY TO GET  READY MORNING OF [place].  Do not eat food or drink liquids :After Midnight.     Take these medicines the morning of surgery with A SIP OF WATER:  Eyedrops as usually do, Cymbalta, Flonase, Lyrica                                 You may not have any metal on your body including hair pins and              piercings  Do not wear jewelry, make-up, lotions, powders or perfumes, deodorant             Do not wear nail polish.  Do not shave  48 hours prior to surgery.             Do not bring valuables to the hospital. [place].  Contacts, dentures or bridgework may not be worn into surgery.  Leave suitcase in the car. After surgery it may be brought to your room.         Special Instructions: coughing and deep breathing exercises, leg exercises               Please read over the following fact sheets you were given: _____________________________________________________________________             [facility] - Preparing for Surgery Before surgery, you can play an important role.  Because skin is not sterile, your skin needs to be as free of germs as possible.  You can reduce the number of germs on your skin by washing with CHG (chlorahexidine gluconate) soap before surgery.  CHG is an antiseptic cleaner which kills germs and bonds with the skin to continue killing germs even after washing. Please DO NOT use if you have an allergy to CHG or antibacterial soaps.  If your skin becomes reddened/irritated stop using the CHG and inform your nurse when you arrive at Short Stay. Do not shave  (including legs and underarms) for at least 48 hours prior to the first CHG shower.  You may shave your face/neck. Please follow these instructions carefully:  1.  Shower with CHG Soap the night before surgery and the  morning of Surgery.  2.  If you choose to wash your hair, wash your hair first as usual with your  normal  shampoo.  3.  After you shampoo, rinse your hair and body thoroughly to remove the  shampoo.                           4.  Use CHG as you would any other liquid soap.  You can apply chg directly  to the skin and wash  Gently with a scrungie or clean washcloth.  5.  Apply the CHG Soap to your body ONLY FROM THE NECK DOWN.   Do not use on face/ open                           Wound or open sores. Avoid contact with eyes, ears mouth and genitals (private parts).                       Wash face,  Genitals (private parts) with your normal soap.             6.  Wash thoroughly, paying special attention to the area where your surgery  will be performed.  7.  Thoroughly rinse your body with warm water from the neck down.  8.  DO NOT shower/wash with your normal soap after using and rinsing off  the CHG Soap.                9.  Pat yourself dry with a clean towel.            10.  Wear clean pajamas.            11.  Place clean sheets on your bed the night of your first shower and do not  sleep with pets. Day of Surgery : Do not apply any lotions/deodorants the morning of surgery.  Please wear clean clothes to the hospital/surgery center.  FAILURE TO FOLLOW THESE INSTRUCTIONS MAY RESULT IN THE CANCELLATION OF YOUR SURGERY PATIENT SIGNATURE_________________________________  NURSE SIGNATURE__________________________________  ________________________________________________________________________  WHAT IS A BLOOD TRANSFUSION? Blood Transfusion Information  A transfusion is the replacement of blood or some of its parts. Blood is made up of multiple cells which  provide different functions.  Red blood cells carry oxygen and are used for blood loss replacement.  White blood cells fight against infection.  Platelets control bleeding.  Plasma helps clot blood.  Other blood products are available for specialized needs, such as hemophilia or other clotting disorders. BEFORE THE TRANSFUSION  Who gives blood for transfusions?   Healthy volunteers who are fully evaluated to make sure their blood is safe. This is blood bank blood. Transfusion therapy is the safest it has ever been in the practice of medicine. Before blood is taken from a donor, a complete history is taken to make sure that person has no history of diseases nor engages in risky social behavior (examples are intravenous drug use or sexual activity with multiple partners). The donor's travel history is screened to minimize risk of transmitting infections, such as malaria. The donated blood is tested for signs of infectious diseases, such as HIV and hepatitis. The blood is then tested to be sure it is compatible with you in order to minimize the chance of a transfusion reaction. If you or a relative donates blood, this is often done in anticipation of surgery and is not appropriate for emergency situations. It takes many days to process the donated blood. RISKS AND COMPLICATIONS Although transfusion therapy is very safe and saves many lives, the main dangers of transfusion include:   Getting an infectious disease.  Developing a transfusion reaction. This is an allergic reaction to something in the blood you were given. Every precaution is taken to prevent this. The decision to have a blood transfusion has been considered carefully by your caregiver before blood is given. Blood is not given unless the benefits outweigh  the risks. AFTER THE TRANSFUSION  Right after receiving a blood transfusion, you will usually feel much better and more energetic. This is especially true if your red blood cells  have gotten low (anemic). The transfusion raises the level of the red blood cells which carry oxygen, and this usually causes an energy increase.  The nurse administering the transfusion will monitor you carefully for complications. HOME CARE INSTRUCTIONS  No special instructions are needed after a transfusion. You may find your energy is better. Speak with your caregiver about any limitations on activity for underlying diseases you may have. SEEK MEDICAL CARE IF:   Your condition is not improving after your transfusion.  You develop redness or irritation at the intravenous (IV) site. SEEK IMMEDIATE MEDICAL CARE IF:  Any of the following symptoms occur over the next 12 hours:  Shaking chills.  You have a temperature by mouth above 102 F (38.9 C), not controlled by medicine.  Chest, back, or muscle pain.  People around you feel you are not acting correctly or are confused.  Shortness of breath or difficulty breathing.  Dizziness and fainting.  You get a rash or develop hives.  You have a decrease in urine output.  Your urine turns a dark color or changes to pink, red, or brown. Any of the following symptoms occur over the next 10 days:  You have a temperature by mouth above 102 F (38.9 C), not controlled by medicine.  Shortness of breath.  Weakness after normal activity.  The white part of the eye turns yellow (jaundice).  You have a decrease in the amount of urine or are urinating less often.  Your urine turns a dark color or changes to pink, red, or brown. Document Released: 09/13/2000 Document Revised: 12/09/2011 Document Reviewed: 05/02/2008 [facility] Patient Information 2014 [employer] [place], [place].  _______________________________________________________________________

## 2015-11-16 DIAGNOSIS — H35413 Lattice degeneration of retina, bilateral: Secondary | ICD-10-CM | POA: Diagnosis not present

## 2015-11-16 DIAGNOSIS — H5000 Unspecified esotropia: Secondary | ICD-10-CM | POA: Diagnosis not present

## 2015-11-16 DIAGNOSIS — H35371 Puckering of macula, right eye: Secondary | ICD-10-CM | POA: Diagnosis not present

## 2015-11-16 DIAGNOSIS — H35363 Drusen (degenerative) of macula, bilateral: Secondary | ICD-10-CM | POA: Diagnosis not present

## 2015-11-22 DIAGNOSIS — H401122 Primary open-angle glaucoma, left eye, moderate stage: Secondary | ICD-10-CM | POA: Diagnosis not present

## 2015-11-22 DIAGNOSIS — B309 Viral conjunctivitis, unspecified: Secondary | ICD-10-CM | POA: Diagnosis not present

## 2015-11-22 DIAGNOSIS — H401112 Primary open-angle glaucoma, right eye, moderate stage: Secondary | ICD-10-CM | POA: Diagnosis not present

## 2015-11-22 DIAGNOSIS — H01003 Unspecified blepharitis right eye, unspecified eyelid: Secondary | ICD-10-CM | POA: Diagnosis not present

## 2015-11-22 DIAGNOSIS — H1013 Acute atopic conjunctivitis, bilateral: Secondary | ICD-10-CM | POA: Diagnosis not present

## 2015-11-24 ENCOUNTER — Inpatient Hospital Stay (HOSPITAL_COMMUNITY)
Admission: RE | Admit: 2015-11-24 | Discharge: 2015-11-26 | DRG: 467 | Disposition: A | Discharge status: Home Health | Payer: Medicare Other | Source: Ambulatory Visit | Attending: Orthopaedic Surgery | Admitting: Orthopaedic Surgery

## 2015-11-24 ENCOUNTER — Encounter (HOSPITAL_COMMUNITY)
Admission: RE | Disposition: A | Discharge status: Home Health | Payer: Self-pay | Source: Ambulatory Visit | Attending: Orthopaedic Surgery

## 2015-11-24 ENCOUNTER — Inpatient Hospital Stay (HOSPITAL_COMMUNITY): Payer: Medicare Other | Admitting: Certified Registered"

## 2015-11-24 ENCOUNTER — Encounter (HOSPITAL_COMMUNITY): Payer: Self-pay | Admitting: *Deleted

## 2015-11-24 ENCOUNTER — Inpatient Hospital Stay (HOSPITAL_COMMUNITY): Payer: Medicare Other

## 2015-11-24 DIAGNOSIS — Z79899 Other long term (current) drug therapy: Secondary | ICD-10-CM

## 2015-11-24 DIAGNOSIS — Z96652 Presence of left artificial knee joint: Secondary | ICD-10-CM | POA: Diagnosis not present

## 2015-11-24 DIAGNOSIS — Y792 Prosthetic and other implants, materials and accessory orthopedic devices associated with adverse incidents: Secondary | ICD-10-CM | POA: Diagnosis present

## 2015-11-24 DIAGNOSIS — Z01812 Encounter for preprocedural laboratory examination: Secondary | ICD-10-CM | POA: Diagnosis not present

## 2015-11-24 DIAGNOSIS — M25562 Pain in left knee: Secondary | ICD-10-CM | POA: Diagnosis not present

## 2015-11-24 DIAGNOSIS — Z96641 Presence of right artificial hip joint: Secondary | ICD-10-CM | POA: Diagnosis present

## 2015-11-24 DIAGNOSIS — T84093A Other mechanical complication of internal left knee prosthesis, initial encounter: Secondary | ICD-10-CM | POA: Diagnosis present

## 2015-11-24 DIAGNOSIS — Z981 Arthrodesis status: Secondary | ICD-10-CM

## 2015-11-24 DIAGNOSIS — M1712 Unilateral primary osteoarthritis, left knee: Secondary | ICD-10-CM | POA: Diagnosis not present

## 2015-11-24 DIAGNOSIS — D62 Acute posthemorrhagic anemia: Secondary | ICD-10-CM | POA: Diagnosis not present

## 2015-11-24 DIAGNOSIS — M179 Osteoarthritis of knee, unspecified: Secondary | ICD-10-CM | POA: Diagnosis not present

## 2015-11-24 DIAGNOSIS — Z471 Aftercare following joint replacement surgery: Secondary | ICD-10-CM | POA: Diagnosis not present

## 2015-11-24 DIAGNOSIS — M8580 Other specified disorders of bone density and structure, unspecified site: Secondary | ICD-10-CM | POA: Diagnosis present

## 2015-11-24 HISTORY — PX: TOTAL KNEE REVISION: SHX996

## 2015-11-24 LAB — TYPE AND SCREEN
ABO/RH(D): B POS
ANTIBODY SCREEN: NEGATIVE

## 2015-11-24 SURGERY — TOTAL KNEE REVISION
Anesthesia: Monitor Anesthesia Care | Site: Knee | Laterality: Left

## 2015-11-24 MED ORDER — METOCLOPRAMIDE HCL 10 MG PO TABS: 5.0000 mg | ORAL_TABLET | Freq: Three times a day (TID) | ORAL | Status: DC | PRN

## 2015-11-24 MED ORDER — OXYCODONE HCL 5 MG PO TABS
5.0000 mg | ORAL_TABLET | ORAL | Status: DC | PRN
  Administered 2015-11-24: 5 mg via ORAL
  Administered 2015-11-24 – 2015-11-26 (×9): 10 mg via ORAL
  Filled 2015-11-24: qty 1
  Filled 2015-11-24 (×4): qty 2
  Filled 2015-11-24 (×2): qty 1
  Filled 2015-11-24 (×5): qty 2

## 2015-11-24 MED ORDER — PROPOFOL 10 MG/ML IV BOLUS
INTRAVENOUS | Status: DC | PRN
  Administered 2015-11-24: 30 mg via INTRAVENOUS

## 2015-11-24 MED ORDER — MENTHOL 3 MG MT LOZG: 1.0000 | LOZENGE | OROMUCOSAL | Status: DC | PRN

## 2015-11-24 MED ORDER — PROPOFOL 10 MG/ML IV BOLUS
INTRAVENOUS | Status: AC
  Filled 2015-11-24: qty 20

## 2015-11-24 MED ORDER — PREGABALIN 50 MG PO CAPS
50.0000 mg | ORAL_CAPSULE | Freq: Two times a day (BID) | ORAL | Status: DC
  Administered 2015-11-24 – 2015-11-26 (×4): 50 mg via ORAL
  Filled 2015-11-24 (×4): qty 1

## 2015-11-24 MED ORDER — METHOCARBAMOL 500 MG PO TABS
500.0000 mg | ORAL_TABLET | Freq: Four times a day (QID) | ORAL | Status: DC | PRN
  Administered 2015-11-24 – 2015-11-25 (×4): 500 mg via ORAL
  Filled 2015-11-24 (×4): qty 1

## 2015-11-24 MED ORDER — FENTANYL CITRATE (PF) 100 MCG/2ML IJ SOLN: 25.0000 ug | INTRAMUSCULAR | Status: DC | PRN

## 2015-11-24 MED ORDER — POLYETHYLENE GLYCOL 3350 17 G PO PACK
17.0000 g | PACK | Freq: Every day | ORAL | Status: DC | PRN
  Administered 2015-11-25 – 2015-11-26 (×2): 17 g via ORAL
  Filled 2015-11-24 (×2): qty 1

## 2015-11-24 MED ORDER — SODIUM CHLORIDE 0.9 % IV SOLN
INTRAVENOUS | Status: DC
  Administered 2015-11-24 – 2015-11-25 (×2): via INTRAVENOUS

## 2015-11-24 MED ORDER — HYDROMORPHONE HCL 1 MG/ML IJ SOLN
1.0000 mg | INTRAMUSCULAR | Status: DC | PRN
  Administered 2015-11-24 (×2): 1 mg via INTRAVENOUS
  Filled 2015-11-24 (×2): qty 1

## 2015-11-24 MED ORDER — METOCLOPRAMIDE HCL 5 MG/ML IJ SOLN
5.0000 mg | Freq: Three times a day (TID) | INTRAMUSCULAR | Status: DC | PRN
Start: 2015-11-24 — End: 2015-11-26

## 2015-11-24 MED ORDER — ICAPS AREDS 2 PO CAPS: 1.0000 | ORAL_CAPSULE | Freq: Two times a day (BID) | ORAL | Status: DC

## 2015-11-24 MED ORDER — TRANEXAMIC ACID 1000 MG/10ML IV SOLN
1000.0000 mg | INTRAVENOUS | Status: AC
  Administered 2015-11-24: 1000 mg via INTRAVENOUS
  Filled 2015-11-24: qty 10

## 2015-11-24 MED ORDER — PROSIGHT PO TABS
1.0000 | ORAL_TABLET | Freq: Two times a day (BID) | ORAL | Status: DC
  Administered 2015-11-24 – 2015-11-26 (×4): 1 via ORAL
  Filled 2015-11-24 (×5): qty 1

## 2015-11-24 MED ORDER — METHOCARBAMOL 1000 MG/10ML IJ SOLN
500.0000 mg | Freq: Four times a day (QID) | INTRAMUSCULAR | Status: DC | PRN
  Administered 2015-11-24: 500 mg via INTRAVENOUS
  Filled 2015-11-24 (×2): qty 5

## 2015-11-24 MED ORDER — SODIUM CHLORIDE 0.9 % IV BOLUS (SEPSIS)
250.0000 mL | Freq: Once | INTRAVENOUS | Status: AC
  Administered 2015-11-24: 250 mL via INTRAVENOUS

## 2015-11-24 MED ORDER — ONDANSETRON HCL 4 MG/2ML IJ SOLN
INTRAMUSCULAR | Status: AC
  Filled 2015-11-24: qty 2

## 2015-11-24 MED ORDER — VITAMIN D3 25 MCG (1000 UNIT) PO TABS
2000.0000 [IU] | ORAL_TABLET | Freq: Every evening | ORAL | Status: DC
  Administered 2015-11-24 – 2015-11-25 (×2): 2000 [IU] via ORAL
  Filled 2015-11-24 (×3): qty 2

## 2015-11-24 MED ORDER — CEFAZOLIN SODIUM-DEXTROSE 2-3 GM-% IV SOLR
2.0000 g | INTRAVENOUS | Status: AC
  Administered 2015-11-24: 2 g via INTRAVENOUS

## 2015-11-24 MED ORDER — CEFAZOLIN SODIUM 1-5 GM-% IV SOLN
1.0000 g | Freq: Four times a day (QID) | INTRAVENOUS | Status: AC
  Administered 2015-11-24 (×2): 1 g via INTRAVENOUS
  Filled 2015-11-24 (×2): qty 50

## 2015-11-24 MED ORDER — ZOLPIDEM TARTRATE 5 MG PO TABS: 5.0000 mg | ORAL_TABLET | Freq: Every evening | ORAL | Status: DC | PRN

## 2015-11-24 MED ORDER — ONDANSETRON HCL 4 MG/2ML IJ SOLN: 4.0000 mg | Freq: Four times a day (QID) | INTRAMUSCULAR | Status: DC | PRN

## 2015-11-24 MED ORDER — PROPOFOL 10 MG/ML IV BOLUS
INTRAVENOUS | Status: AC
  Filled 2015-11-24: qty 40

## 2015-11-24 MED ORDER — DIPHENHYDRAMINE HCL 12.5 MG/5ML PO ELIX: 12.5000 mg | ORAL_SOLUTION | ORAL | Status: DC | PRN

## 2015-11-24 MED ORDER — CEFAZOLIN SODIUM-DEXTROSE 2-3 GM-% IV SOLR
INTRAVENOUS | Status: AC
  Filled 2015-11-24: qty 50

## 2015-11-24 MED ORDER — ONDANSETRON HCL 4 MG/2ML IJ SOLN
INTRAMUSCULAR | Status: DC | PRN
  Administered 2015-11-24 (×2): 2 mg via INTRAVENOUS

## 2015-11-24 MED ORDER — PROPOFOL 500 MG/50ML IV EMUL
INTRAVENOUS | Status: DC | PRN
  Administered 2015-11-24: 100 ug/kg/min via INTRAVENOUS

## 2015-11-24 MED ORDER — FENTANYL CITRATE (PF) 100 MCG/2ML IJ SOLN
INTRAMUSCULAR | Status: AC
  Filled 2015-11-24: qty 2

## 2015-11-24 MED ORDER — MIDAZOLAM HCL 5 MG/5ML IJ SOLN
INTRAMUSCULAR | Status: DC | PRN
  Administered 2015-11-24: 2 mg via INTRAVENOUS

## 2015-11-24 MED ORDER — SODIUM CHLORIDE 0.9 % IR SOLN
Status: DC | PRN
  Administered 2015-11-24: 2000 mL

## 2015-11-24 MED ORDER — MIDAZOLAM HCL 2 MG/2ML IJ SOLN
INTRAMUSCULAR | Status: AC
  Filled 2015-11-24: qty 2

## 2015-11-24 MED ORDER — CYCLOSPORINE 0.05 % OP EMUL
1.0000 [drp] | Freq: Two times a day (BID) | OPHTHALMIC | Status: DC
  Administered 2015-11-25 – 2015-11-26 (×3): 1 [drp] via OPHTHALMIC
  Filled 2015-11-24 (×5): qty 1

## 2015-11-24 MED ORDER — ONDANSETRON HCL 4 MG PO TABS: 4.0000 mg | ORAL_TABLET | Freq: Four times a day (QID) | ORAL | Status: DC | PRN

## 2015-11-24 MED ORDER — LIDOCAINE HCL (CARDIAC) 20 MG/ML IV SOLN
INTRAVENOUS | Status: DC | PRN
  Administered 2015-11-24: 40 mg via INTRAVENOUS

## 2015-11-24 MED ORDER — RAMELTEON 8 MG PO TABS
8.0000 mg | ORAL_TABLET | Freq: Every day | ORAL | Status: DC
  Filled 2015-11-24 (×3): qty 1

## 2015-11-24 MED ORDER — LUTEIN 20 MG PO TABS: 1.0000 | ORAL_TABLET | Freq: Every evening | ORAL | Status: DC

## 2015-11-24 MED ORDER — RIVAROXABAN 10 MG PO TABS
10.0000 mg | ORAL_TABLET | Freq: Every day | ORAL | Status: DC
  Administered 2015-11-25 – 2015-11-26 (×2): 10 mg via ORAL
  Filled 2015-11-24 (×3): qty 1

## 2015-11-24 MED ORDER — LACTATED RINGERS IV SOLN
INTRAVENOUS | Status: DC | PRN
  Administered 2015-11-24 (×3): via INTRAVENOUS

## 2015-11-24 MED ORDER — OXYCODONE HCL 5 MG/5ML PO SOLN: 5.0000 mg | Freq: Once | ORAL | Status: AC | PRN

## 2015-11-24 MED ORDER — OXYCODONE HCL 5 MG PO TABS
5.0000 mg | ORAL_TABLET | Freq: Once | ORAL | Status: AC | PRN
  Administered 2015-11-24: 5 mg via ORAL

## 2015-11-24 MED ORDER — ACETAMINOPHEN 325 MG PO TABS: 650.0000 mg | ORAL_TABLET | Freq: Four times a day (QID) | ORAL | Status: DC | PRN

## 2015-11-24 MED ORDER — FLUTICASONE PROPIONATE 50 MCG/ACT NA SUSP
2.0000 | Freq: Every day | NASAL | Status: DC
  Administered 2015-11-24 – 2015-11-25 (×2): 2 via NASAL
  Filled 2015-11-24: qty 16

## 2015-11-24 MED ORDER — LIDOCAINE HCL (CARDIAC) 20 MG/ML IV SOLN
INTRAVENOUS | Status: AC
  Filled 2015-11-24: qty 5

## 2015-11-24 MED ORDER — KETOROLAC TROMETHAMINE 15 MG/ML IJ SOLN
15.0000 mg | Freq: Four times a day (QID) | INTRAMUSCULAR | Status: DC
  Administered 2015-11-24 – 2015-11-26 (×6): 15 mg via INTRAVENOUS
  Filled 2015-11-24 (×13): qty 1

## 2015-11-24 MED ORDER — ACETAMINOPHEN 650 MG RE SUPP: 650.0000 mg | Freq: Four times a day (QID) | RECTAL | Status: DC | PRN

## 2015-11-24 MED ORDER — ALUM & MAG HYDROXIDE-SIMETH 200-200-20 MG/5ML PO SUSP: 30.0000 mL | ORAL | Status: DC | PRN

## 2015-11-24 MED ORDER — DULOXETINE HCL 60 MG PO CPEP
60.0000 mg | ORAL_CAPSULE | Freq: Every day | ORAL | Status: DC
  Administered 2015-11-25 – 2015-11-26 (×2): 60 mg via ORAL
  Filled 2015-11-24 (×2): qty 1

## 2015-11-24 MED ORDER — PHENOL 1.4 % MT LIQD: 1.0000 | OROMUCOSAL | Status: DC | PRN

## 2015-11-24 MED ORDER — DOCUSATE SODIUM 100 MG PO CAPS
100.0000 mg | ORAL_CAPSULE | Freq: Two times a day (BID) | ORAL | Status: DC
  Administered 2015-11-24 – 2015-11-26 (×4): 100 mg via ORAL

## 2015-11-24 MED ORDER — BUPIVACAINE IN DEXTROSE 0.75-8.25 % IT SOLN
INTRATHECAL | Status: DC | PRN
  Administered 2015-11-24: 2 mL via INTRATHECAL

## 2015-11-24 MED ORDER — VITAMIN D 1000 UNITS PO CAPS: 2000.0000 [IU] | ORAL_CAPSULE | Freq: Every evening | ORAL | Status: DC

## 2015-11-24 SURGICAL SUPPLY — 50 items
APL SKNCLS STERI-STRIP NONHPOA (GAUZE/BANDAGES/DRESSINGS)
BAG SPEC THK2 15X12 ZIP CLS (MISCELLANEOUS)
BAG ZIPLOCK 12X15 (MISCELLANEOUS) IMPLANT
BANDAGE ACE 6X5 VEL STRL LF (GAUZE/BANDAGES/DRESSINGS) ×1 IMPLANT
BANDAGE ELASTIC 6 VELCRO ST LF (GAUZE/BANDAGES/DRESSINGS) ×1 IMPLANT
BENZOIN TINCTURE PRP APPL 2/3 (GAUZE/BANDAGES/DRESSINGS) IMPLANT
BLADE SAG 18X100X1.27 (BLADE) ×2 IMPLANT
CAPT KNEE TOTAL 3 ×1 IMPLANT
CEMENT BONE 1-PACK (Cement) ×2 IMPLANT
CLOTH BEACON ORANGE TIMEOUT ST (SAFETY) ×2 IMPLANT
CUFF TOURN SGL QUICK 34 (TOURNIQUET CUFF) ×2
CUFF TRNQT CYL 34X4X40X1 (TOURNIQUET CUFF) ×1 IMPLANT
DRAPE U-SHAPE 47X51 STRL (DRAPES) ×2 IMPLANT
DRSG PAD ABDOMINAL 8X10 ST (GAUZE/BANDAGES/DRESSINGS) ×1 IMPLANT
DURAPREP 26ML APPLICATOR (WOUND CARE) ×2 IMPLANT
ELECT REM PT RETURN 9FT ADLT (ELECTROSURGICAL) ×2
ELECTRODE REM PT RTRN 9FT ADLT (ELECTROSURGICAL) ×1 IMPLANT
EVACUATOR 1/8 PVC DRAIN (DRAIN) IMPLANT
GAUZE SPONGE 4X4 12PLY STRL (GAUZE/BANDAGES/DRESSINGS) ×2 IMPLANT
GAUZE XEROFORM 5X9 LF (GAUZE/BANDAGES/DRESSINGS) IMPLANT
GLOVE BIO SURGEON STRL SZ7.5 (GLOVE) ×2 IMPLANT
GLOVE BIOGEL PI IND STRL 8 (GLOVE) ×2 IMPLANT
GLOVE BIOGEL PI INDICATOR 8 (GLOVE) ×2
GLOVE ECLIPSE 8.0 STRL XLNG CF (GLOVE) ×2 IMPLANT
GOWN STRL REUS W/TWL XL LVL3 (GOWN DISPOSABLE) ×4 IMPLANT
HANDPIECE INTERPULSE COAX TIP (DISPOSABLE) ×2
IMMOBILIZER KNEE 20 (SOFTGOODS) ×1 IMPLANT
IMMOBILIZER KNEE 20 THIGH 36 (SOFTGOODS) ×1 IMPLANT
PACK TOTAL KNEE CUSTOM (KITS) ×2 IMPLANT
PAD ABD 8X10 STRL (GAUZE/BANDAGES/DRESSINGS) ×1 IMPLANT
PADDING CAST COTTON 6X4 STRL (CAST SUPPLIES) ×3 IMPLANT
POSITIONER SURGICAL ARM (MISCELLANEOUS) ×2 IMPLANT
SET HNDPC FAN SPRY TIP SCT (DISPOSABLE) ×1 IMPLANT
SET PAD KNEE POSITIONER (MISCELLANEOUS) ×2 IMPLANT
SPONGE LAP 18X18 X RAY DECT (DISPOSABLE) IMPLANT
STAPLER VISISTAT 35W (STAPLE) IMPLANT
STRIP CLOSURE SKIN 1/2X4 (GAUZE/BANDAGES/DRESSINGS) ×1 IMPLANT
SUT MNCRL AB 4-0 PS2 18 (SUTURE) ×1 IMPLANT
SUT VIC AB 0 CT1 36 (SUTURE) ×2 IMPLANT
SUT VIC AB 1 CT1 36 (SUTURE) ×4 IMPLANT
SUT VIC AB 2-0 CT1 27 (SUTURE) ×4
SUT VIC AB 2-0 CT1 TAPERPNT 27 (SUTURE) ×2 IMPLANT
SWAB COLLECTION DEVICE MRSA (MISCELLANEOUS) IMPLANT
SWAB CULTURE ESWAB REG 1ML (MISCELLANEOUS) IMPLANT
TOWER CARTRIDGE SMART MIX (DISPOSABLE) IMPLANT
TRAY FOLEY W/METER SILVER 14FR (SET/KITS/TRAYS/PACK) ×2 IMPLANT
TRAY FOLEY W/METER SILVER 16FR (SET/KITS/TRAYS/PACK) ×1 IMPLANT
TUBE KAMVAC SUCTION (TUBING) IMPLANT
WATER STERILE IRR 1500ML POUR (IV SOLUTION) ×2 IMPLANT
WRAP KNEE MAXI GEL POST OP (GAUZE/BANDAGES/DRESSINGS) ×1 IMPLANT

## 2015-11-24 NOTE — Discharge Instructions (Signed)
Information on my medicine - XARELTO® (Rivaroxaban) ° °This medication education was reviewed with me or my healthcare representative as part of my discharge preparation.  The pharmacist that spoke with me during my hospital stay was:  Simcha Farrington Robert, RPH ° °Why was Xarelto® prescribed for you? °Xarelto® was prescribed for you to reduce the risk of blood clots forming after orthopedic surgery. The medical term for these abnormal blood clots is venous thromboembolism (VTE). ° °What do you need to know about xarelto® ? °Take your Xarelto® ONCE DAILY at the same time every day. °You may take it either with or without food. ° °If you have difficulty swallowing the tablet whole, you may crush it and mix in applesauce just prior to taking your dose. ° °Take Xarelto® exactly as prescribed by your doctor and DO NOT stop taking Xarelto® without talking to the doctor who prescribed the medication.  Stopping without other VTE prevention medication to take the place of Xarelto® may increase your risk of developing a clot. ° °After discharge, you should have regular check-up appointments with your healthcare provider that is prescribing your Xarelto®.   ° °What do you do if you miss a dose? °If you miss a dose, take it as soon as you remember on the same day then continue your regularly scheduled once daily regimen the next day. Do not take two doses of Xarelto® on the same day.  ° °Important Safety Information °A possible side effect of Xarelto® is bleeding. You should call your healthcare provider right away if you experience any of the following: °? Bleeding from an injury or your nose that does not stop. °? Unusual colored urine (red or dark brown) or unusual colored stools (red or black). °? Unusual bruising for unknown reasons. °? A serious fall or if you hit your head (even if there is no bleeding). ° °Some medicines may interact with Xarelto® and might increase your risk of bleeding while on Xarelto®. To help  avoid this, consult your healthcare provider or pharmacist prior to using any new prescription or non-prescription medications, including herbals, vitamins, non-steroidal anti-inflammatory drugs (NSAIDs) and supplements. ° °This website has more information on Xarelto®: www.xarelto.com. ° ° ° °

## 2015-11-24 NOTE — Anesthesia Preprocedure Evaluation (Signed)
Anesthesia Evaluation  Patient identified by MRN, date of birth, ID band Patient awake    Reviewed: Allergy & Precautions, NPO status , Patient's Chart, lab work & pertinent test results, reviewed documented beta blocker date and time   Airway Mallampati: II   Neck ROM: full    Dental   Pulmonary neg pulmonary ROS,    breath sounds clear to auscultation       Cardiovascular negative cardio ROS   Rhythm:regular Rate:Normal     Neuro/Psych PSYCHIATRIC DISORDERS Depression    GI/Hepatic   Endo/Other    Renal/GU      Musculoskeletal  (+) Arthritis ,   Abdominal   Peds  Hematology   Anesthesia Other Findings   Reproductive/Obstetrics                             Anesthesia Physical Anesthesia Plan  ASA: II  Anesthesia Plan: MAC and Spinal   Post-op Pain Management:    Induction: Intravenous  Airway Management Planned: Simple Face Mask  Additional Equipment:   Intra-op Plan:   Post-operative Plan:   Informed Consent: I have reviewed the patients History and Physical, chart, labs and discussed the procedure including the risks, benefits and alternatives for the proposed anesthesia with the patient or authorized representative who has indicated his/her understanding and acceptance.     Plan Discussed with: CRNA, Anesthesiologist and Surgeon  Anesthesia Plan Comments:         Anesthesia Quick Evaluation

## 2015-11-24 NOTE — Transfer of Care (Signed)
Immediate Anesthesia Transfer of Care Note  Patient: Tanya Leon  Procedure(s) Performed: Procedure(s): Conversion from left lateral unicompartmental knee arthroplasty to left total knee arthroplasty (Left)  Patient Location: PACU  Anesthesia Type:Spinal  Level of Consciousness:  sedated, patient cooperative and responds to stimulation  Airway & Oxygen Therapy:Patient Spontanous Breathing and Patient connected to face mask oxgen  Post-op Assessment:  Report given to PACU RN and Post -op Vital signs reviewed and stable  Post vital signs:  Reviewed and stable  Last Vitals:  Filed Vitals:   11/24/15 0537  BP: 114/71  Pulse: 65  Temp: 36.6 C  Resp: 16    Complications: No apparent anesthesia complications

## 2015-11-24 NOTE — Anesthesia Postprocedure Evaluation (Signed)
Anesthesia Post Note  Patient: Tanya Leon  Procedure(s) Performed: Procedure(s) (LRB): Conversion from left lateral unicompartmental knee arthroplasty to left total knee arthroplasty (Left)  Patient location during evaluation: PACU Anesthesia Type: Spinal Level of consciousness: oriented and awake and alert Pain management: pain level controlled Vital Signs Assessment: post-procedure vital signs reviewed and stable Respiratory status: spontaneous breathing, respiratory function stable and patient connected to nasal cannula oxygen Cardiovascular status: blood pressure returned to baseline and stable Postop Assessment: no headache and no backache Anesthetic complications: no    Last Vitals:  Filed Vitals:   11/24/15 1115 11/24/15 1218  BP: 125/58 126/69  Pulse: 55 64  Temp: 36.4 C 36.4 C  Resp: 16 16    Last Pain:  Filed Vitals:   11/24/15 1218  PainSc: [place]

## 2015-11-24 NOTE — H&P (Signed)
TOTAL KNEE ADMISSION H&P  Patient is being admitted for left total knee arthroplasty.  Subjective:  Chief Complaint:left knee pain.  HPI: Tanya Leon, 75 y.o. female, has a history of pain and functional disability in the left knee due to arthritis and a failed lateral compartment partial knee replacement and has failed non-surgical conservative treatments for greater than 12 weeks to includeNSAID's and/or analgesics, flexibility and strengthening excercises and activity modification.  Onset of symptoms was gradual, starting 2 years ago with gradually worsening course since that time. The patient noted prior procedures on the knee to include  unicompartmental arthroplasty on the left knee(s).  Patient currently rates pain in the left knee(s) at 10 out of 10 with activity. Patient has night pain, worsening of pain with activity and weight bearing, pain that interferes with activities of daily living and pain with passive range of motion.  Patient has evidence of subchondral sclerosis, periarticular osteophytes and joint space narrowing by imaging studies. There is no active infection.  Patient Active Problem List   Diagnosis Date Noted  . 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 Thrombocytopenia ([place]) 05/03/2015  . Basal cell carcinoma 07/07/2014  . 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 Glaucoma 06/01/2014  . Depression 06/01/2014  . 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 COLONIC POLYPS 01/13/2009  . 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 SPINAL STENOSIS, LUMBAR 01/25/2008  . INSOMNIA, CHRONIC 07/22/2007  . ALLERGIC RHINITIS 05/21/2007  . Osteopenia 02/26/2007   Past Medical History  Diagnosis Date  . Osteopenia   . Constipation     opiod induced associated with back pain  . Low back pain   . Lumbar spondylolysis   . Arthritis   . Diverticulosis   . Colon polyps     adenomatous  . Heart murmur     benign, dagnosed around age  42,prophylactic antibiotic  . DIVERTICULOSIS, COLON 01/13/2009  . Complication of anesthesia     " pt stated they gave me too much - kept having to be reminded to breathe   . Pneumonia     hx of as a baby   . Depression     hx of   . Cancer ([place])     hx of skin cancer     Past Surgical History  Procedure Laterality Date  . Tonsillectomly    . Abdominal hysterectomy      with ovaries  . Perforated deverticular abscess/laporotomy and colostomy      depression after this  . Take down of colostomy    . Lumbar fusion      L4-L5  . Abdominal hernia repair Right     incisional  . Incisional ab  05/28/11  . Partial knee arthroplasty Right   . Eye muscle surgery    . Partial knee arthroplasty Left   . Stribismus eye surgery Right   . Glaucoma surgery Left 05/12/14  . Cataract left lens replacement      2016  . Joint replacement      right hip replacement     Prescriptions prior to admission  Medication Sig Dispense Refill Last Dose  . AMBULATORY NON FORMULARY MEDICATION Evening Primerose Oil 1300 mg take 1 by mouth twice a day   11/23/2015 at Unknown time  . aspirin-acetaminophen-caffeine (EXCEDRIN EXTRA STRENGTH) 250-250-65 MG per tablet Take 2 tablets by mouth every 6 (six) hours as needed for headache. Reported on 11/03/2015   11/22/2015  . azithromycin (AZASITE) 1 %  ophthalmic solution Place 1 drop into both eyes every 30 (thirty) days. One week per month   Taking  . carboxymethylcellulose (REFRESH PLUS) 0.5 % SOLN Place 1 drop into both eyes daily as needed (Dry eyes.).    11/24/2015 at 0500  . Cholecalciferol (VITAMIN D) 1000 UNITS capsule Take 2,000 Units by mouth every evening.    11/23/2015 at Unknown time  . Ciclopirox 1 % shampoo Apply 1 each topically every Friday.    Taking  . cycloSPORINE (RESTASIS) 0.05 % ophthalmic emulsion Place 1 drop into both eyes 2 (two) times daily.    11/24/2015 at 0500  . DULoxetine (CYMBALTA) 60 MG capsule Take 1 capsule (60 mg total) by mouth  daily. 90 capsule 3 11/24/2015 at 0500  . estradiol (VIVELLE-DOT) 0.05 MG/24HR Place 1 patch onto the skin 2 (two) times a week. Reported on 11/03/2015 on thursdays and "Sundays   11/24/2015 at Unknown time  . fluticasone (FLONASE) 50 MCG/ACT nasal spray Use 2 sprays in each  nostril daily 48 g 5 11/23/2015 at Unknown time  . Lutein 20 MG TABS Take 1 tablet by mouth every evening.    11/23/2015 at Unknown time  . Multiple Vitamins-Minerals (ICAPS AREDS 2) CAPS Take 1 capsule by mouth 2 (two) times daily.   11/23/2015 at Unknown time  . Multiple Vitamins-Minerals (MULTIVITAMIN PO) Take 1 tablet by mouth every morning.    11/23/2015 at Unknown time  . Olopatadine HCl (PATADAY) 0.2 % SOLN Place 1 drop into both eyes daily as needed (dry/itchy eyes.).    11/24/2015 at 0500  . omega-3 acid ethyl esters (LOVAZA) 1 G capsule Take 1 g by mouth 2 (two) times daily.    11/23/2015 at Unknown time  . polyethylene glycol powder (GLYCOLAX/MIRALAX) powder Take 17 g by mouth every morning.    11/23/2015 at Unknown time  . pregabalin (LYRICA) 50 MG capsule Take 1 capsule (50 mg total) by mouth 2 (two) times daily. 180 capsule 3 11/24/2015 at 0500  . Probiotic Product (ALIGN) 4 MG CAPS Take 1 capsule by mouth every morning.    11/23/2015 at Unknown time  . tiZANidine (ZANAFLEX) 2 MG tablet Take 1 tablet (2 mg total) by mouth at bedtime. (Patient taking differently: Take 2 mg by mouth at bedtime. As needed for muscle spasms) 30 tablet 5 11/23/2015 at 2200  . zolpidem (AMBIEN) 5 MG tablet Take 5 mg by mouth at bedtime as needed for sleep.   11/23/2015 at 2200  . fluocinonide cream (LIDEX) 0.05 % Apply 1 application topically 2 (two) times daily. Shampoos hair 1-2 times per week with this     . ramelteon (ROZEREM) 8 MG tablet Take 1 tablet (8 mg total) by mouth at bedtime. 30 tablet 3 11/22/2015   Allergies  Allergen Reactions  . Shellfish Allergy Swelling    Social History  Substance Use Topics  . Smoking status: Never Smoker   .  Smokeless tobacco: Never Used  . Alcohol Use: 0.0 oz/week    0 Standard drinks or equivalent per week     Comment: 1 glass of wine daily     Family History  Problem Relation Age of Onset  . Alzheimer's disease Father   . Heart disease Father     mitral valve replaced. unknown reason  . Breast cancer Mother     70"   . Prostate cancer Brother   . Colon polyps Mother   . Diabetes Mother   . Heart disease Mother 63  CHF, MI 83  . Heart attack Mother     x 2     Review of Systems  Musculoskeletal: Positive for joint pain.  All other systems reviewed and are negative.   Objective:  Physical Exam  Constitutional: She is oriented to person, place, and time. She appears well-developed and well-nourished.  HENT:  Head: Normocephalic and atraumatic.  Eyes: EOM are normal. Pupils are equal, round, and reactive to light.  Neck: Normal range of motion. Neck supple.  Cardiovascular: Normal rate and regular rhythm.   Respiratory: Effort normal and breath sounds normal.  GI: Soft. Bowel sounds are normal.  Musculoskeletal:       Left knee: She exhibits decreased range of motion, swelling and effusion. Tenderness found. Medial joint line and lateral joint line tenderness noted.  Neurological: She is alert and oriented to person, place, and time.  Skin: Skin is warm and dry.  Psychiatric: She has a normal mood and affect.    Vital signs in last 24 hours: Temp:  [97.9 F (36.6 C)] 97.9 F (36.6 C) (02/24 0537) Pulse Rate:  [65] 65 (02/24 0537) Resp:  [16] 16 (02/24 0537) BP: (114)/(71) 114/71 mmHg (02/24 0537) SpO2:  [97 %] 97 % (02/24 0537)  Labs:   Estimated body mass index is 24.68 kg/(m^2) as calculated from the following:   Height as of 11/15/15: 5' 2.25" (1.581 m).   Weight as of 05/30/15: 61.689 kg (136 lb).   Imaging Review Plain radiographs demonstrate severe degenerative joint disease of the left knee(s). The overall alignment isneutral. The bone quality appears to  be good for age and reported activity level.  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 The treatment options including medical management, injection therapy arthroscopy and arthroplasty were discussed at length. The risks and benefits of total knee arthroplasty were presented and reviewed. The risks due to aseptic loosening, infection, stiffness, patella tracking problems, thromboembolic complications and other imponderables were discussed. The patient acknowledged the explanation, agreed to proceed with the plan and consent was signed. Patient is being admitted for inpatient treatment for surgery, pain control, PT, OT, prophylactic antibiotics, VTE prophylaxis, progressive ambulation and ADL's and discharge planning. The patient is planning to be discharged home with home health services

## 2015-11-24 NOTE — Evaluation (Signed)
Physical Therapy Evaluation Patient Details Name: Tanya Leon MRN: [id number] DOB: 1941/05/17 Today's Date: 11/24/2015   History of Present Illness  L TKR; s/p bil UKR and R THR  Clinical Impression  Pt s/p L TKR presents with decreased L LE strength/ROM and post op pain limiting functional mobility.  Pt should progress to dc home with family assist and HHPT follow up.    Follow Up Recommendations Home health PT    Equipment Recommendations  None recommended by PT    Recommendations for Other Services OT consult     Precautions / Restrictions Precautions Precautions: Knee Required Braces or Orthoses: Knee Immobilizer - Left Knee Immobilizer - Left: Discontinue once straight leg raise with < 10 degree lag Restrictions Weight Bearing Restrictions: No Other Position/Activity Restrictions: WBAT      Mobility  Bed Mobility Overal bed mobility: Needs Assistance Bed Mobility: Supine to Sit     Supine to sit: Min assist     General bed mobility comments: cues for sequence and use of R LE to self assist  Transfers Overall transfer level: Needs assistance Equipment used: Rolling walker (2 wheeled) Transfers: Sit to/from Stand Sit to Stand: Min assist         General transfer comment: cues for LE management and use of UEs to self assist  Ambulation/Gait Ambulation/Gait assistance: Min assist Ambulation Distance (Feet): 30 Feet Assistive device: Rolling walker (2 wheeled) Gait Pattern/deviations: Step-to pattern;Decreased step length - right;Decreased step length - left;Shuffle;Trunk flexed Gait velocity: decr Gait velocity interpretation: Below normal speed for age/gender General Gait Details: cues for sequence, posture and position from [employer]            Wheelchair Mobility    Modified Rankin (Stroke Patients Only)       Balance                                             Pertinent Vitals/Pain Pain Assessment: 0-10 Pain  Score: 4  Pain Location: L knee Pain Descriptors / Indicators: Aching;Sore Pain Intervention(s): Limited activity within patient's tolerance;Monitored during session;Premedicated before session;Ice applied    Home Living Family/patient expects to be discharged to:: Private residence Living Arrangements: Spouse/significant other Available Help at Discharge: Family Type of Home: House Home Access: Stairs to enter Entrance Stairs-Rails: Right Entrance Stairs-Number of Steps: 2 Home Layout: One level Home Equipment: Environmental consultant - 2 wheels;Cane - single point      Prior Function Level of Independence: Independent               Hand Dominance        Extremity/Trunk Assessment   Upper Extremity Assessment: Overall WFL for tasks assessed           Lower Extremity Assessment: LLE deficits/detail      Cervical / Trunk Assessment: Normal  Communication   Communication: No difficulties  Cognition Arousal/Alertness: Awake/alert Behavior During Therapy: WFL for tasks assessed/performed Overall Cognitive Status: Within Functional Limits for tasks assessed                      General Comments      Exercises Total Joint Exercises Ankle Circles/Pumps: AAROM;Both;15 reps;Supine      Assessment/Plan    PT Assessment Patient needs continued PT services  PT Diagnosis Difficulty walking   PT Problem List Decreased strength;Decreased range of motion;Decreased  activity tolerance;Decreased mobility;Decreased knowledge of use of DME;Pain  PT Treatment Interventions DME instruction;Gait training;Stair training;Functional mobility training;Therapeutic activities;Therapeutic exercise;Patient/family education   PT Goals (Current goals can be found in the Care Plan section) Acute Rehab PT Goals Patient Stated Goal: Regain IND PT Goal Formulation: With patient Time For Goal Achievement: 11/28/15 Potential to Achieve Goals: Good    Frequency 7X/week   Barriers to  discharge        Co-evaluation               End of Session Equipment Utilized During Treatment: Left knee immobilizer;Gait belt Activity Tolerance: Patient tolerated treatment well Patient left: in chair;with call bell/phone within reach;with family/visitor present Nurse Communication: Mobility status         Time: [id number] PT Time Calculation (min) (ACUTE ONLY): 30 min   Charges:   PT Evaluation $PT Eval Low Complexity: 1 Procedure PT Treatments $Gait Training: 8-22 mins   PT G Codes:        Gurney Balthazor 12/09/15, 6:27 PM

## 2015-11-24 NOTE — Progress Notes (Signed)
Utilization review completed.  

## 2015-11-24 NOTE — Anesthesia Procedure Notes (Signed)
Spinal Patient location during procedure: OR Start time: 11/24/2015 7:37 AM End time: 11/24/2015 7:39 AM Reason for block: at surgeon's request Staffing Resident/CRNA: Freddie Breech Performed by: resident/CRNA  Preanesthetic Checklist Completed: patient identified, site marked, surgical consent, pre-op evaluation, timeout performed, IV checked, risks and benefits discussed, monitors and equipment checked and at surgeon's request Spinal Block Patient position: sitting Prep: ChloraPrep Approach: midline Location: L2-3 Injection technique: single-shot Needle Needle type: Sprotte  Needle gauge: 24 G Needle length: 10 cm Assessment Sensory level: T6 Additional Notes Expiration date of kit checked and confirmed. Patient tolerated procedure well, without complications. X 1 attempt with noted clear CSF return. Loss of motor and sensory on exam post injection.

## 2015-11-24 NOTE — Progress Notes (Signed)
PHARMACIST - PHYSICIAN ORDER COMMUNICATION  CONCERNING: P&T Medication Policy on Herbal Medications  DESCRIPTION:  This patient's order for:  Lutein  has been noted.  This product(s) is classified as an "herbal" or natural product. Due to a lack of definitive safety studies or FDA approval, nonstandard manufacturing practices, plus the potential risk of unknown drug-drug interactions while on inpatient medications, the Pharmacy and Therapeutics Committee does not permit the use of "herbal" or natural products of this type within [facility].   ACTION TAKEN: The pharmacy department is unable to verify this order at this time and your patient has been informed of this safety policy. Please reevaluate patient's clinical condition at discharge and address if the herbal or natural product(s) should be resumed at that time.   Peggyann Juba, PharmD, [place] [phone] 11/24/2015 11:47 AM

## 2015-11-24 NOTE — Brief Op Note (Signed)
11/24/2015  9:12 AM  PATIENT:  Tanya Leon  75 y.o. female  PRE-OPERATIVE DIAGNOSIS:  Osteoarthritis left knee, retained left lateral unicompartmental knee arthroplasty  POST-OPERATIVE DIAGNOSIS:  Osteoarthritis left knee, retained left lateral unicompartmental knee arthroplasty  PROCEDURE:  Procedure(s): Conversion from left lateral unicompartmental knee arthroplasty to left total knee arthroplasty (Left)  SURGEON:  Surgeon(s) and Role:    * Mcarthur Rossetti, MD - Primary  PHYSICIAN ASSISTANT: Benita Stabile, PA-C  ANESTHESIA:   spinal  EBL:  Total I/O In: 2000 [I.V.:2000] Out: 200 [Urine:200]  COUNTS:  YES  TOURNIQUET:   Total Tourniquet Time Documented: Thigh (Left) - 58 minutes Total: Thigh (Left) - 58 minutes   DICTATION: .Other Dictation: Dictation Number [id number]  PLAN OF CARE: Admit to inpatient   PATIENT DISPOSITION:  PACU - hemodynamically stable.   Delay start of Pharmacological VTE agent (>24hrs) due to surgical blood loss or risk of bleeding: no

## 2015-11-25 LAB — BASIC METABOLIC PANEL
Anion gap: 6 (ref 5–15)
BUN: 16 mg/dL (ref 6–20)
CO2: 26 mmol/L (ref 22–32)
CREATININE: 0.74 mg/dL (ref 0.44–1.00)
Calcium: 7.9 mg/dL — ABNORMAL LOW (ref 8.9–10.3)
Chloride: 99 mmol/L — ABNORMAL LOW (ref 101–111)
Glucose, Bld: 100 mg/dL — ABNORMAL HIGH (ref 65–99)
Potassium: 3.8 mmol/L (ref 3.5–5.1)
SODIUM: 131 mmol/L — AB (ref 135–145)

## 2015-11-25 LAB — CBC
HCT: 31.1 % — ABNORMAL LOW (ref 36.0–46.0)
HEMOGLOBIN: 10.2 g/dL — AB (ref 12.0–15.0)
MCH: 30.8 pg (ref 26.0–34.0)
MCHC: 32.8 g/dL (ref 30.0–36.0)
MCV: 94 fL (ref 78.0–100.0)
PLATELETS: 121 10*3/uL — AB (ref 150–400)
RBC: 3.31 MIL/uL — AB (ref 3.87–5.11)
RDW: 13.5 % (ref 11.5–15.5)
WBC: 6.5 10*3/uL (ref 4.0–10.5)

## 2015-11-25 NOTE — Progress Notes (Signed)
Physical Therapy Treatment Patient Details Name: Tanya Leon MRN: [id number] DOB: 04-Aug-1941 Today's Date: 11/25/2015    History of Present Illness L TKR; s/p bil UKR and R THR    PT Comments    Pt progressing steadily with mobility.  Dr Ninfa Linden in and spouse expressing concern regarding pain control if dc tomorrow.  Follow Up Recommendations  Home health PT     Equipment Recommendations  None recommended by PT    Recommendations for Other Services OT consult     Precautions / Restrictions Precautions Precautions: Knee Required Braces or Orthoses: Knee Immobilizer - Left Knee Immobilizer - Left: Discontinue once straight leg raise with < 10 degree lag Restrictions Weight Bearing Restrictions: No Other Position/Activity Restrictions: WBAT    Mobility  Bed Mobility Overal bed mobility: Needs Assistance Bed Mobility: Sit to Supine       Sit to supine: Min assist   General bed mobility comments: cues for sequence and use of R LE to self assist.  Physical assist to manage R LE  Transfers Overall transfer level: Needs assistance Equipment used: Rolling walker (2 wheeled) Transfers: Sit to/from Stand Sit to Stand: Min assist;Min guard         General transfer comment: cues for LE management and use of UEs to self assist.  Pt from chair, to/from [facility] and to EOB  Ambulation/Gait Ambulation/Gait assistance: Min assist;Min guard Ambulation Distance (Feet): 115 Feet (and 18' into bathroom) Assistive device: Rolling walker (2 wheeled) Gait Pattern/deviations: Step-to pattern;Decreased step length - right;Decreased step length - left;Shuffle;Trunk flexed Gait velocity: decr Gait velocity interpretation: Below normal speed for age/gender General Gait Details: cues for sequence, posture and position from [employer]            Wheelchair Mobility    Modified Rankin (Stroke Patients Only)       Balance                                     Cognition Arousal/Alertness: Awake/alert Behavior During Therapy: WFL for tasks assessed/performed Overall Cognitive Status: Within Functional Limits for tasks assessed                      Exercises Total Joint Exercises Ankle Circles/Pumps: AAROM;Both;15 reps;Supine Quad Sets: AROM;Both;10 reps;Supine Heel Slides: AAROM;Left;15 reps;Supine Straight Leg Raises: AAROM;Left;10 reps;Supine    General Comments        Pertinent Vitals/Pain Pain Assessment: 0-10 Pain Score: 5  Pain Location: L knee Pain Descriptors / Indicators: Aching;Sore Pain Intervention(s): Limited activity within patient's tolerance;Monitored during session;Premedicated before session;Ice applied    Home Living                      Prior Function            PT Goals (current goals can now be found in the care plan section) Acute Rehab PT Goals Patient Stated Goal: Regain IND PT Goal Formulation: With patient Time For Goal Achievement: 11/28/15 Potential to Achieve Goals: Good Progress towards PT goals: Progressing toward goals    Frequency  7X/week    PT Plan Current plan remains appropriate    Co-evaluation             End of Session Equipment Utilized During Treatment: Left knee immobilizer;Gait belt Activity Tolerance: Patient tolerated treatment well Patient left: in bed;with call bell/phone within reach;with family/visitor  present     Time: 1315-1400 PT Time Calculation (min) (ACUTE ONLY): 45 min  Charges:  $Gait Training: 23-37 mins $Therapeutic Exercise: 8-22 mins                    G Codes:      Tanya Leon November 28, 2015, 3:50 PM

## 2015-11-25 NOTE — Progress Notes (Signed)
Subjective: 1 Day Post-Op Procedure(s) (LRB): Conversion from left lateral unicompartmental knee arthroplasty to left total knee arthroplasty (Left) Patient reports pain as moderate.  Acute blood loss anemia from surgery, but minimal and asymptomatic.  Objective: Vital signs in last 24 hours: Temp:  [97.8 F (36.6 C)-98.4 F (36.9 C)] 98.4 F (36.9 C) (02/25 0430) Pulse Rate:  [60-74] 66 (02/25 0430) Resp:  [16-20] 20 (02/25 0430) BP: (91-105)/(49-56) 95/49 mmHg (02/25 0430) SpO2:  [94 %-98 %] 94 % (02/25 0430)  Intake/Output from previous day: 02/24 0701 - 02/25 0700 In: 4740 [P.O.:240; I.V.:4100; IV Piggyback:400] Out: 1275 [Urine:1200; Blood:75] Intake/Output this shift: Total I/O In: 240 [P.O.:240] Out: -    Recent Labs  11/25/15 0424  HGB 10.2*    Recent Labs  11/25/15 0424  WBC 6.5  RBC 3.31*  HCT 31.1*  PLT 121*    Recent Labs  11/25/15 0424  NA 131*  K 3.8  CL 99*  CO2 26  BUN 16  CREATININE 0.74  GLUCOSE 100*  CALCIUM 7.9*   No results for input(s): LABPT, INR in the last 72 hours.  Sensation intact distally Intact pulses distally Dorsiflexion/Plantar flexion intact Incision: scant drainage No cellulitis present Compartment soft  Assessment/Plan: 1 Day Post-Op Procedure(s) (LRB): Conversion from left lateral unicompartmental knee arthroplasty to left total knee arthroplasty (Left) Up with therapy Discharge home with home health Monday.  Tanya Leon Y 11/25/2015, 1:48 PM

## 2015-11-25 NOTE — Progress Notes (Signed)
Physical Therapy Treatment Patient Details Name: ARRIONNA MEDDAUGH MRN: [id number] DOB: 06-23-1941 Today's Date: 11/25/2015    History of Present Illness L TKR; s/p bil UKR and R THR    PT Comments    Pt progressing steadily with mobility and hopeful for dc home tomorrow.  Follow Up Recommendations  Home health PT     Equipment Recommendations  None recommended by PT    Recommendations for Other Services OT consult     Precautions / Restrictions Precautions Precautions: Knee Required Braces or Orthoses: Knee Immobilizer - Left Knee Immobilizer - Left: Discontinue once straight leg raise with < 10 degree lag Restrictions Weight Bearing Restrictions: No Other Position/Activity Restrictions: WBAT    Mobility  Bed Mobility               General bed mobility comments: Pt OOB  Transfers Overall transfer level: Needs assistance Equipment used: Rolling walker (2 wheeled) Transfers: Sit to/from Stand Sit to Stand: Min assist;Min guard         General transfer comment: cues for LE management and use of UEs to self assist  Ambulation/Gait Ambulation/Gait assistance: Min assist;Min guard Ambulation Distance (Feet): 62 Feet Assistive device: Rolling walker (2 wheeled) Gait Pattern/deviations: Step-to pattern;Decreased step length - right;Decreased step length - left;Shuffle;Trunk flexed Gait velocity: decr Gait velocity interpretation: Below normal speed for age/gender General Gait Details: cues for sequence, posture and position from [employer]            Wheelchair Mobility    Modified Rankin (Stroke Patients Only)       Balance                                    Cognition Arousal/Alertness: Awake/alert Behavior During Therapy: WFL for tasks assessed/performed Overall Cognitive Status: Within Functional Limits for tasks assessed                      Exercises Total Joint Exercises Ankle Circles/Pumps: AAROM;Both;15  reps;Supine Quad Sets: AROM;Both;10 reps;Supine Heel Slides: AAROM;Left;15 reps;Supine Straight Leg Raises: AAROM;Left;10 reps;Supine    General Comments        Pertinent Vitals/Pain Pain Assessment: 0-10 Pain Score: 5  Pain Location: L knee Pain Descriptors / Indicators: Aching;Sore Pain Intervention(s): Limited activity within patient's tolerance;Monitored during session;Premedicated before session;Ice applied    Home Living Family/patient expects to be discharged to:: Private residence Living Arrangements: Spouse/significant other Available Help at Discharge: Family Type of Home: House Home Access: Stairs to enter Entrance Stairs-Rails: Right Home Layout: One level Home Equipment: Environmental consultant - 2 wheels;Cane - single point;Bedside commode;Adaptive equipment      Prior Function Level of Independence: Independent          PT Goals (current goals can now be found in the care plan section) Acute Rehab PT Goals Patient Stated Goal: Regain IND PT Goal Formulation: With patient Time For Goal Achievement: 11/28/15 Potential to Achieve Goals: Good Progress towards PT goals: Progressing toward goals    Frequency  7X/week    PT Plan Current plan remains appropriate    Co-evaluation             End of Session Equipment Utilized During Treatment: Left knee immobilizer;Gait belt Activity Tolerance: Patient tolerated treatment well Patient left: Other (comment) (Walking into bathroom with OT)     Time: [id number] PT Time Calculation (min) (ACUTE ONLY): 30 min  Charges:  $  Gait Training: 8-22 mins $Therapeutic Exercise: 8-22 mins                    G Codes:      Kanasia Gayman Nov 29, 2015, 12:46 PM

## 2015-11-25 NOTE — Progress Notes (Signed)
Occupational Therapy Evaluation Patient Details Name: DALARIE LONGTIN MRN: [id number] DOB: Oct 15, 1940 Today's Date: 11/25/2015    History of Present Illness L TKR; s/p bil UKR and R THR   Clinical Impression   Patient presents to OT s/p L TKR with decreased ADL independence as expected post-op. Will follow to maximize independence prior to d/c home with family assistance.     Follow Up Recommendations  No OT follow up;Supervision - Intermittent    Equipment Recommendations  None recommended by OT    Recommendations for Other Services       Precautions / Restrictions Precautions Precautions: Knee Required Braces or Orthoses: Knee Immobilizer - Left Knee Immobilizer - Left: Discontinue once straight leg raise with < 10 degree lag Restrictions Weight Bearing Restrictions: No Other Position/Activity Restrictions: WBAT      Mobility Bed Mobility               General bed mobility comments: up walking with PT  Transfers Overall transfer level: Needs assistance Equipment used: Rolling walker (2 wheeled) Transfers: Sit to/from Stand Sit to Stand: Min guard;Supervision         General transfer comment: cues for LE management and use of UEs to self assist    Balance                                            ADL Overall ADL's : Needs assistance/impaired Eating/Feeding: Independent;Sitting   Grooming: Wash/dry hands;Supervision/safety;Standing   Upper Body Bathing: Set up;Sitting   Lower Body Bathing: Minimal assistance;Sit to/from stand   Upper Body Dressing : Set up;Sitting   Lower Body Dressing: Minimal assistance;Sit to/from stand   Toilet Transfer: Min guard;Ambulation;Regular Toilet;BSC;RW   Toileting- Water quality scientist and Hygiene: Min guard;Sit to/from stand       Functional mobility during ADLs: Min guard;Supervision/safety;Rolling walker General ADL Comments: Patient seen just finishing with PT. Performed toileting  task, groom at sink, back to recliner where she was positioned comfortably with ice on knee. Patient has all necessary equipment at home. Will follow up one more session to review LB dressing with reacher and walk-in shower transfer.     Vision     Perception     Praxis      Pertinent Vitals/Pain Pain Assessment: 0-10 Pain Score: 5  Pain Location: L knee Pain Descriptors / Indicators: Aching;Sore Pain Intervention(s): Monitored during session;Repositioned;Ice applied     Hand Dominance Right   Extremity/Trunk Assessment Upper Extremity Assessment Upper Extremity Assessment: Overall WFL for tasks assessed   Lower Extremity Assessment Lower Extremity Assessment: Defer to PT evaluation   Cervical / Trunk Assessment Cervical / Trunk Assessment: Normal   Communication Communication Communication: No difficulties   Cognition Arousal/Alertness: Awake/alert Behavior During Therapy: WFL for tasks assessed/performed Overall Cognitive Status: Within Functional Limits for tasks assessed                     General Comments       Exercises       Shoulder Instructions      Home Living Family/patient expects to be discharged to:: Private residence Living Arrangements: Spouse/significant other Available Help at Discharge: Family Type of Home: House Home Access: Stairs to enter Technical brewer of Steps: 2 Entrance Stairs-Rails: Right Home Layout: One level     Bathroom Shower/Tub: Occupational psychologist: Standard  Home Equipment: [place] - 2 wheels;Cane - single point;Bedside commode;Adaptive equipment Adaptive Equipment: Reacher        Prior Functioning/Environment Level of Independence: Independent             OT Diagnosis: Acute pain   OT Problem List: Decreased strength;Decreased range of motion;Decreased knowledge of use of DME or AE;Pain   OT Treatment/Interventions: Self-care/ADL training;DME and/or AE  instruction;Therapeutic activities;Patient/family education    OT Goals(Current goals can be found in the care plan section) Acute Rehab OT Goals Patient Stated Goal: Regain IND OT Goal Formulation: With patient Time For Goal Achievement: 12/09/15 Potential to Achieve Goals: Good  OT Frequency: Min 2X/week   Barriers to D/C:            Co-evaluation              End of Session Equipment Utilized During Treatment: Rolling walker;Left knee immobilizer Nurse Communication: Mobility status  Activity Tolerance: Patient tolerated treatment well Patient left: in chair;with call bell/phone within reach;with chair alarm set;with family/visitor present   Time: [id number] OT Time Calculation (min): 19 min Charges:  OT General Charges $OT Visit: 1 Procedure OT Evaluation $OT Eval Low Complexity: 1 Procedure OT Treatments $Self Care/Home Management : 8-22 mins G-Codes:    Deklyn Gibbon A 2015/12/25, 11:51 AM

## 2015-11-25 NOTE — Care Management Note (Signed)
Case Management Note  Patient Details  Name: Tanya Leon MRN: [id number] Date of Birth: 1941/06/18  Subjective/Objective:     left total knee arthroplasty                Action/Plan: NCM spoke to pt's husband. She has RW and 3n1 at home. Offered choice for [facility]. Husband states pt had [place] in the past. Requesting [facility] for [facility].   Expected Discharge Date:  11/26/2015               Expected Discharge Plan:  [place]  In-House Referral:  NA  Discharge planning Services  CM Consult  Post Acute Care Choice:  Home Health Choice offered to:  Spouse  DME Arranged:  N/A DME Agency:  NA  HH Arranged:  PT HH Agency:  [place]  Status of Service:  Completed, signed off  Medicare Important Message Given:    Date Medicare IM Given:    Medicare IM give by:    Date Additional Medicare IM Given:    Additional Medicare Important Message give by:     If discussed at [place] of Stay Meetings, dates discussed:    Additional Comments:  Erenest Rasher, RN 11/25/2015, 3:20 PM

## 2015-11-25 NOTE — Op Note (Signed)
NAMEVERINA, SEABERRY                ACCOUNT NO.:  [id number]  MEDICAL RECORD NO.:  [id number]  LOCATION:  [facility]                         FACILITY:  [facility]  PHYSICIAN:  Lind Guest. Ninfa Linden, M.D.DATE OF BIRTH:  [birth year]  DATE OF PROCEDURE:  11/24/2015 DATE OF DISCHARGE:                              OPERATIVE REPORT   PREOPERATIVE DIAGNOSIS:  Tricompartmental arthritis, left knee, with failed lateral unicompartmental knee arthroplasty.  POSTOPERATIVE DIAGNOSIS:  Tricompartmental arthritis, left knee, with failed lateral unicompartmental knee arthroplasty.  PROCEDURES: 1. Removal of left lateral unicompartmental knee replacement. 2. Left total knee arthroplasty.  IMPLANTS:  Smith & Nephew size 4 left posterior stabilized Legion Oxinium femoral component, Genesis II left nonporous coated tibial baseplate, 13-mm constrained polyethylene insert, size 29 polyethylene patellar button.  SURGEON:  Lind Guest. Ninfa Linden, M.D.  ASSISTANT:  Erskine Emery, PA-C  ANESTHESIA:  Spinal.  ANTIBIOTICS:  2 g IV Ancef.  TOURNIQUET TIME:  Less than 1 hour.  BLOOD LOSS:  Less than 200 mL.  COMPLICATIONS:  None.  INDICATIONS:  The patient is a very pleasant 75 year old female, who has bilateral lateral unicompartmental knee replacements that were done over at [facility].  The one that was on the left side was done on the knee with tricompartmental arthritis with mild valgus deformity, however, she knew this before, and apparently, at [facility], they had recommended a total knee, but she wanted unicompartmental knee because she had successful unicompartmental on her right knee.  The right knee really bothers her minimally, but the left knee really hurts quite a bit after many years.  She hurts medially, laterally and the patellofemoral joint x-rays show a well-seated implant on the lateral unicompartmental knee replacement, but she has significant patellofemoral and medial compartment  arthritis.  At this point, due to continued pain in her knee, decreased mobility, decreased quality of life, and decreased activities of daily living, she wished to proceed with a conversion of her left unicompartmental knee replacement to a total knee replacement. The risks and benefits of surgery were explained to her in detail including the risk of bone loss, nerve and vessel injury, fracture, infection, and DVT with the goals of decreased pain, improved mobility, and overall improved quality of life.  PROCEDURE DESCRIPTION:  After informed consent was obtained, appropriate left knee was marked.  She was brought to the operating room and spinal anesthesia was obtained.  She was laid supine on the operating table and Foley catheter was placed.  A nonsterile tourniquet was placed around her upper left leg, and her upper left thigh and her left lower extremity were prepped and draped from the thigh down the toes with DuraPrep and sterile drapes including sterile stockinette.  A time-out was called and she was identified as correct patient and correct left knee.  We then used Esmarch to wrap out the leg and tourniquet was inflated to 300 mm of pressure.  We avoided her old lateral incision from unicompartmental knee replacement that was done again remotely and made a midline incision.  We were able to dissect down to the knee joint and performed a medial parapatellar arthrotomy finding a moderate joint effusion, but no evidence of infection at  all.  We then flexed the knee up and I was able to remove remnants of ACL, PCL, and lateral meniscus. We removed osteophytes throughout the knee.  We then were able to remove the tibial baseplate without sacrificing much bone at all and removed the cement debris.  We then set our extramedullary cutting guide for the tibia, now evening out our cut and taking 9 mm off her high side which was medial.  We made this cut without difficulty.  We then placed  an intramedullary guide in the femur and set our distal femoral cutting guide to take 9 mm.  We made this cut on the medial side and then we were able to remove the implant on the lateral side and freshened this cut up.  Again, we were able to accomplish this without any significant bone loss.  We then put our femoral sizing guide based off the Whiteside's line and epicondylar axis and chose a size 4 femur.  We did our anterior and posterior cuts off 4-in-1 cutting block for size 4 and our posterior cut and chamfer cuts.  We then made our femoral box cut. We went back to the tibia and trialed for a size 3 tibia and made our drill hole and keel punch off this with the trial 3 tibia and the trial 4 femur.  We went up to a 13-mm constrained polyethylene insert and we had balanced flexion and extension gaps as well as her varus/valgus appeared stable.  We then made our patella cut and drilled 3 holes for a 29 patellar button.  With all trial components in place, we were pleased with stability and range of motion.  We then removed all trial components and irrigated the knee with pulsatile lavage.  We mixed our cement and cemented the real Smith & Nephew size 3 tibial tray followed by the real size 4 femur.  After the 3 tibial tray, we placed our real fix bearing 13-mm constrained polyethylene insert and cemented our patellar button.  We then removed the excess cement and debris from the knee.  Once the cement had hardened, we let the tourniquet down. Hemostasis was obtained with electrocautery.  We then closed the arthrotomy with an interrupted #1 Vicryl suture followed by 0 Vicryl on the deep tissue, 2-0 Vicryl on the subcutaneous tissue, 4-0 Monocryl subcuticular stitch and Steri-Strips on the skin.  Well-padded sterile dressing was applied, and she was taken to the recovery room in stable condition.  All final counts were correct.  There were no complications noted.  Of note, Erskine Emery,  PA-C, assisted in the entire case.  His assistance was crucial for facilitating all aspects of this case.     Lind Guest. Ninfa Linden, M.D.     CYB/MEDQ  D:  11/24/2015  T:  11/24/2015  Job:  [id number]

## 2015-11-26 LAB — CBC
HCT: 30.9 % — ABNORMAL LOW (ref 36.0–46.0)
HEMOGLOBIN: 10.3 g/dL — AB (ref 12.0–15.0)
MCH: 31.1 pg (ref 26.0–34.0)
MCHC: 33.3 g/dL (ref 30.0–36.0)
MCV: 93.4 fL (ref 78.0–100.0)
PLATELETS: 120 10*3/uL — AB (ref 150–400)
RBC: 3.31 MIL/uL — AB (ref 3.87–5.11)
RDW: 13.4 % (ref 11.5–15.5)
WBC: 8.4 10*3/uL (ref 4.0–10.5)

## 2015-11-26 MED ORDER — METHOCARBAMOL 500 MG PO TABS: 500.0000 mg | ORAL_TABLET | Freq: Four times a day (QID) | ORAL | Status: DC | PRN

## 2015-11-26 MED ORDER — RIVAROXABAN 10 MG PO TABS: 10.0000 mg | ORAL_TABLET | Freq: Every day | ORAL | Status: DC

## 2015-11-26 MED ORDER — OXYCODONE HCL 5 MG PO TABS: 5.0000 mg | ORAL_TABLET | Freq: Four times a day (QID) | ORAL | Status: DC | PRN

## 2015-11-26 NOTE — Progress Notes (Signed)
Physical Therapy Treatment Patient Details Name: Tanya Leon MRN: [id number] DOB: 08-06-1941 Today's Date: 11/26/2015    History of Present Illness L TKR; s/p bil UKR and R THR    PT Comments    Patient is progressing well. Requires no KI. Will practice steps next visit.  Follow Up Recommendations  Home health PT     Equipment Recommendations       Recommendations for Other Services       Precautions / Restrictions Precautions Precautions: Knee Restrictions Weight Bearing Restrictions: No Other Position/Activity Restrictions: WBAT    Mobility  Bed Mobility            Transfers Overall transfer level: Needs assistance Equipment used: Rolling walker (2 wheeled) Transfers: Sit to/from Stand Sit to Stand: Supervision            Ambulation/Gait Ambulation/Gait assistance: Supervision Ambulation Distance (Feet): 100 Feet Assistive device: Rolling walker (2 wheeled) Gait Pattern/deviations: Step-to pattern;Step-through pattern Gait velocity: decr   General Gait Details: cues for sequence, posture and position from RW   Stairs    Wheelchair Mobility    Modified Rankin (Stroke Patients Only)       Balance                                    Cognition Arousal/Alertness: Awake/alert Behavior During Therapy: WFL for tasks assessed/performed Overall Cognitive Status: Within Functional Limits for tasks assessed                      Exercises    General Comments        Pertinent Vitals/Pain Pain Assessment: 0-10 Pain Score: 2  Pain Location: L knee Pain Descriptors / Indicators: Aching Pain Intervention(s): Limited activity within patient's tolerance;Monitored during session;Repositioned    Home Living                      Prior Function            PT Goals (current goals can now be found in the care plan section) Progress towards PT goals: Progressing toward goals    Frequency       PT Plan  Current plan remains appropriate    Co-evaluation             End of Session   Activity Tolerance: Patient tolerated treatment well Patient left:  (with OT)     Time: [id number] PT Time Calculation (min) (ACUTE ONLY): 13 min  Charges:  $Gait Training: 8-22 mins                    G Codes:      Claretha Cooper 11/26/2015, 1:51 PM

## 2015-11-26 NOTE — Progress Notes (Signed)
Physical Therapy Treatment Patient Details Name: Tanya Leon MRN: [id number] DOB: March 01, 1941 Today's Date: 11/26/2015    History of Present Illness L TKR; s/p bil UKR and R THR    PT Comments    Patient is ready for DC.  Follow Up Recommendations  Home health PT     Equipment Recommendations       Recommendations for Other Services       Precautions / Restrictions Precautions Precautions: Knee Restrictions Weight Bearing Restrictions: No Other Position/Activity Restrictions: WBAT    Mobility  Bed Mobility   Bed Mobility: Sit to Supine       Sit to supine: Supervision   General bed mobility comments: able to get into bed.  Transfers Overall transfer level: Needs assistance Equipment used: Rolling walker (2 wheeled) Transfers: Sit to/from Stand Sit to Stand: Supervision            Ambulation/Gait Ambulation/Gait assistance: Supervision Ambulation Distance (Feet): 50 Feet Assistive device: Rolling walker (2 wheeled) Gait Pattern/deviations: Step-through pattern;Step-to pattern Gait velocity: decr   General Gait Details: cues for sequence, posture and position from RW   Stairs Stairs: Yes Stairs assistance: Min assist Stair Management: One rail Left;Forwards;With cane Number of Stairs: 4 General stair comments: spouse present to  assist.  Wheelchair Mobility    Modified Rankin (Stroke Patients Only)       Balance                                    Cognition Arousal/Alertness: Awake/alert Behavior During Therapy: WFL for tasks assessed/performed Overall Cognitive Status: Within Functional Limits for tasks assessed                      Exercises Total Joint Exercises Ankle Circles/Pumps: Both;Supine;AROM;10 reps Quad Sets: AROM;Both;10 reps;Supine Towel Squeeze: AROM;Left;Right;10 reps;Supine Short Arc Quad: AROM;Left;10 reps;Supine Heel Slides: AAROM;Left;10 reps;Supine Hip ABduction/ADduction:  AAROM;Left;10 reps;Supine Straight Leg Raises: AAROM;Left;10 reps;Supine Goniometric ROM: 5-80 l knee flexion    General Comments        Pertinent Vitals/Pain Pain Assessment: 0-10 Pain Score: 2  Pain Location: L knee Pain Descriptors / Indicators: Tender Pain Intervention(s): Premedicated before session    Home Living                      Prior Function            PT Goals (current goals can now be found in the care plan section) Progress towards PT goals: Progressing toward goals    Frequency       PT Plan Current plan remains appropriate    Co-evaluation             End of Session   Activity Tolerance: Patient tolerated treatment well Patient left:  (with OT)     [id number] PT Time Calculation (min) (ACUTE ONLY): 40 min  Charges:  $Gait Training: 8-22 mins $Therapeutic Exercise: 8-22 mins $Self Care/Home Management: 05-26-23                    G Codes:      Claretha Cooper 11/26/2015, 1:59 PM

## 2015-11-26 NOTE — Progress Notes (Signed)
Occupational Therapy Treatment Patient Details Name: Tanya Leon MRN: [id number] DOB: 07/30/1941 Today's Date: 11/26/2015    History of present illness L TKR; s/p bil UKR and R THR   OT comments  All education complete and patient questions answered regarding ADL/ADL transfers. OT will sign off.  Follow Up Recommendations  No OT follow up;Supervision - Intermittent    Equipment Recommendations  None recommended by OT    Recommendations for Other Services      Precautions / Restrictions Precautions Precautions: Knee Restrictions Weight Bearing Restrictions: No Other Position/Activity Restrictions: WBAT       Mobility Bed Mobility               General bed mobility comments: up in chair  Transfers Overall transfer level: Needs assistance Equipment used: Rolling walker (2 wheeled) Transfers: Sit to/from Stand Sit to Stand: Supervision              Balance                                   ADL Overall ADL's : Needs assistance/impaired Eating/Feeding: Independent;Sitting   Grooming: Wash/dry hands;Oral care;Supervision/safety;Standing   Upper Body Bathing: Set up;Sitting   Lower Body Bathing: Minimal assistance;Sit to/from stand   Upper Body Dressing : Set up;Sitting   Lower Body Dressing: Minimal assistance;Sit to/from stand   Toilet Transfer: Supervision/safety;Ambulation   Toileting- Clothing Manipulation and Hygiene: Supervision/safety;Sit to/from stand   Tub/ Shower Transfer: Walk-in shower;Supervision/safety;Ambulation;Rolling walker   Functional mobility during ADLs: Supervision/safety;Rolling walker General ADL Comments: Patient practiced putting on clothing -- bra, shirt, underwear, pants. Min A LB dressing but patient to use reacher at home. Grooming standing at sink. Practiced walk-in shower transfer. No further OT needs. Will sign off.      Vision                     Perception     Praxis       Cognition   Behavior During Therapy: WFL for tasks assessed/performed Overall Cognitive Status: Within Functional Limits for tasks assessed                       Extremity/Trunk Assessment               Exercises     Shoulder Instructions       General Comments      Pertinent Vitals/ Pain       Pain Assessment: 0-10 Pain Score: 2  Pain Location: L knee Pain Descriptors / Indicators: Aching;Sore Pain Intervention(s): Limited activity within patient's tolerance;Monitored during session;Repositioned  Home Living                                          Prior Functioning/Environment              Frequency      Progress Toward Goals  OT Goals(current goals can now be found in the care plan section)  Progress towards OT goals: Goals met/education completed, patient discharged from [place] All goals met and education completed, patient discharged from OT services    Co-evaluation                 End of Session    Activity Tolerance  Patient tolerated treatment well   Patient Left in chair;with call bell/phone within reach;with family/visitor present   Nurse Communication Mobility status        Time: 2060-1561 OT Time Calculation (min): 26 min  Charges: OT General Charges $OT Visit: 1 Procedure OT Treatments $Self Care/Home Management : 23-37 mins  Barbarita Hutmacher A 11/26/2015, 11:02 AM

## 2015-11-26 NOTE — Discharge Summary (Signed)
Physician Discharge Summary  Patient ID: Tanya Leon MRN: [id number] DOB/AGE: 02-Jan-1941 75 y.o.  Admit date: 11/24/2015 Discharge date: 11/26/2015  Admission Diagnoses:failed uniknee arthroplasty  Discharge Diagnoses:  Principal Problem:   Failed left unicompartmental knee replacement ([place]) Active Problems:   Status post total left knee replacement   Discharged Condition: stable  Hospital Course: completed Total knee arthroplasty without complications  Consults: None  Significant Diagnostic Studies: labs: routine  Treatments: surgery: see operative note  Discharge Exam: Blood pressure 123/62, pulse 83, temperature 98.4 F (36.9 C), temperature source Oral, resp. rate 16, height 5' 2.25" (1.581 m), weight 61.236 kg (135 lb), SpO2 96 %. Incision/Wound:clean and dry  Disposition: 01-Home or Self Care     Medication List    ASK your doctor about these medications        ALIGN 4 MG Caps  Take 1 capsule by mouth every morning.     AMBULATORY NON FORMULARY MEDICATION  Evening Primerose Oil 1300 mg take 1 by mouth twice a day     azithromycin 1 % ophthalmic solution  Commonly known as:  AZASITE  Place 1 drop into both eyes every 30 (thirty) days. One week per month     carboxymethylcellulose 0.5 % Soln  Commonly known as:  REFRESH PLUS  Place 1 drop into both eyes daily as needed (Dry eyes.).     Ciclopirox 1 % shampoo  Apply 1 each topically every Friday.     cycloSPORINE 0.05 % ophthalmic emulsion  Commonly known as:  RESTASIS  Place 1 drop into both eyes 2 (two) times daily.     DULoxetine 60 MG capsule  Commonly known as:  CYMBALTA  Take 1 capsule (60 mg total) by mouth daily.     estradiol 0.05 MG/24HR patch  Commonly known as:  VIVELLE-DOT  Place 1 patch onto the skin 2 (two) times a week. Reported on 11/03/2015 on thursdays and Sundays     EXCEDRIN EXTRA STRENGTH [id number] MG tablet  Generic drug:  aspirin-acetaminophen-caffeine  Take 2 tablets by  mouth every 6 (six) hours as needed for headache. Reported on 11/03/2015     fluocinonide cream 0.05 %  Commonly known as:  LIDEX  Apply 1 application topically 2 (two) times daily. Shampoos hair 1-2 times per week with this     fluticasone 50 MCG/ACT nasal spray  Commonly known as:  FLONASE  Use 2 sprays in each  nostril daily     Lutein 20 MG Tabs  Take 1 tablet by mouth every evening.     ICAPS AREDS 2 Caps  Take 1 capsule by mouth 2 (two) times daily.     MULTIVITAMIN PO  Take 1 tablet by mouth every morning.     omega-3 acid ethyl esters 1 g capsule  Commonly known as:  LOVAZA  Take 1 g by mouth 2 (two) times daily.     PATADAY 0.2 % Soln  Generic drug:  Olopatadine HCl  Place 1 drop into both eyes daily as needed (dry/itchy eyes.).     polyethylene glycol powder powder  Commonly known as:  GLYCOLAX/MIRALAX  Take 17 g by mouth every morning.     pregabalin 50 MG capsule  Commonly known as:  LYRICA  Take 1 capsule (50 mg total) by mouth 2 (two) times daily.     ramelteon 8 MG tablet  Commonly known as:  ROZEREM  Take 1 tablet (8 mg total) by mouth at bedtime.     tiZANidine 2  MG tablet  Commonly known as:  ZANAFLEX  Take 1 tablet (2 mg total) by mouth at bedtime.     Vitamin D 1000 units capsule  Take 2,000 Units by mouth every evening.     zolpidem 5 MG tablet  Commonly known as:  AMBIEN  Take 5 mg by mouth at bedtime as needed for sleep.           Follow-up Information    Follow up with Mcarthur Rossetti, MD In 1 week.   Specialty:  Orthopedic Surgery   Contact information:   [place] [address] [phone]       Signed: Newt Minion 11/26/2015, 1:10 PM

## 2015-11-26 NOTE — Progress Notes (Signed)
Pt discharged to home; escorted to lobby in wheelchair; given prescriptions and dc paperwork

## 2015-11-27 ENCOUNTER — Ambulatory Visit: Payer: Medicare Other | Admitting: Family Medicine

## 2015-11-27 DIAGNOSIS — M4326 Fusion of spine, lumbar region: Secondary | ICD-10-CM | POA: Diagnosis not present

## 2015-11-27 DIAGNOSIS — F329 Major depressive disorder, single episode, unspecified: Secondary | ICD-10-CM | POA: Diagnosis not present

## 2015-11-27 DIAGNOSIS — Z471 Aftercare following joint replacement surgery: Secondary | ICD-10-CM | POA: Diagnosis not present

## 2015-11-27 DIAGNOSIS — M8580 Other specified disorders of bone density and structure, unspecified site: Secondary | ICD-10-CM | POA: Diagnosis not present

## 2015-11-27 DIAGNOSIS — M6281 Muscle weakness (generalized): Secondary | ICD-10-CM | POA: Diagnosis not present

## 2015-11-27 DIAGNOSIS — M4806 Spinal stenosis, lumbar region: Secondary | ICD-10-CM | POA: Diagnosis not present

## 2015-11-29 DIAGNOSIS — Z471 Aftercare following joint replacement surgery: Secondary | ICD-10-CM | POA: Diagnosis not present

## 2015-11-29 DIAGNOSIS — M4806 Spinal stenosis, lumbar region: Secondary | ICD-10-CM | POA: Diagnosis not present

## 2015-11-29 DIAGNOSIS — M6281 Muscle weakness (generalized): Secondary | ICD-10-CM | POA: Diagnosis not present

## 2015-11-29 DIAGNOSIS — F329 Major depressive disorder, single episode, unspecified: Secondary | ICD-10-CM | POA: Diagnosis not present

## 2015-11-29 DIAGNOSIS — M8580 Other specified disorders of bone density and structure, unspecified site: Secondary | ICD-10-CM | POA: Diagnosis not present

## 2015-11-29 DIAGNOSIS — M4326 Fusion of spine, lumbar region: Secondary | ICD-10-CM | POA: Diagnosis not present

## 2015-12-01 DIAGNOSIS — F329 Major depressive disorder, single episode, unspecified: Secondary | ICD-10-CM | POA: Diagnosis not present

## 2015-12-01 DIAGNOSIS — M4326 Fusion of spine, lumbar region: Secondary | ICD-10-CM | POA: Diagnosis not present

## 2015-12-01 DIAGNOSIS — M6281 Muscle weakness (generalized): Secondary | ICD-10-CM | POA: Diagnosis not present

## 2015-12-01 DIAGNOSIS — M8580 Other specified disorders of bone density and structure, unspecified site: Secondary | ICD-10-CM | POA: Diagnosis not present

## 2015-12-01 DIAGNOSIS — Z471 Aftercare following joint replacement surgery: Secondary | ICD-10-CM | POA: Diagnosis not present

## 2015-12-01 DIAGNOSIS — M4806 Spinal stenosis, lumbar region: Secondary | ICD-10-CM | POA: Diagnosis not present

## 2015-12-04 DIAGNOSIS — M4806 Spinal stenosis, lumbar region: Secondary | ICD-10-CM | POA: Diagnosis not present

## 2015-12-04 DIAGNOSIS — M6281 Muscle weakness (generalized): Secondary | ICD-10-CM | POA: Diagnosis not present

## 2015-12-04 DIAGNOSIS — M4326 Fusion of spine, lumbar region: Secondary | ICD-10-CM | POA: Diagnosis not present

## 2015-12-04 DIAGNOSIS — F329 Major depressive disorder, single episode, unspecified: Secondary | ICD-10-CM | POA: Diagnosis not present

## 2015-12-04 DIAGNOSIS — Z471 Aftercare following joint replacement surgery: Secondary | ICD-10-CM | POA: Diagnosis not present

## 2015-12-04 DIAGNOSIS — M8580 Other specified disorders of bone density and structure, unspecified site: Secondary | ICD-10-CM | POA: Diagnosis not present

## 2015-12-06 DIAGNOSIS — M8580 Other specified disorders of bone density and structure, unspecified site: Secondary | ICD-10-CM | POA: Diagnosis not present

## 2015-12-06 DIAGNOSIS — Z471 Aftercare following joint replacement surgery: Secondary | ICD-10-CM | POA: Diagnosis not present

## 2015-12-06 DIAGNOSIS — M6281 Muscle weakness (generalized): Secondary | ICD-10-CM | POA: Diagnosis not present

## 2015-12-06 DIAGNOSIS — F329 Major depressive disorder, single episode, unspecified: Secondary | ICD-10-CM | POA: Diagnosis not present

## 2015-12-06 DIAGNOSIS — M4806 Spinal stenosis, lumbar region: Secondary | ICD-10-CM | POA: Diagnosis not present

## 2015-12-06 DIAGNOSIS — M4326 Fusion of spine, lumbar region: Secondary | ICD-10-CM | POA: Diagnosis not present

## 2015-12-07 DIAGNOSIS — Z96652 Presence of left artificial knee joint: Secondary | ICD-10-CM | POA: Diagnosis not present

## 2015-12-08 DIAGNOSIS — Z471 Aftercare following joint replacement surgery: Secondary | ICD-10-CM | POA: Diagnosis not present

## 2015-12-08 DIAGNOSIS — F329 Major depressive disorder, single episode, unspecified: Secondary | ICD-10-CM | POA: Diagnosis not present

## 2015-12-08 DIAGNOSIS — M4806 Spinal stenosis, lumbar region: Secondary | ICD-10-CM | POA: Diagnosis not present

## 2015-12-08 DIAGNOSIS — M6281 Muscle weakness (generalized): Secondary | ICD-10-CM | POA: Diagnosis not present

## 2015-12-08 DIAGNOSIS — M4326 Fusion of spine, lumbar region: Secondary | ICD-10-CM | POA: Diagnosis not present

## 2015-12-08 DIAGNOSIS — M8580 Other specified disorders of bone density and structure, unspecified site: Secondary | ICD-10-CM | POA: Diagnosis not present

## 2015-12-13 DIAGNOSIS — M150 Primary generalized (osteo)arthritis: Secondary | ICD-10-CM | POA: Diagnosis not present

## 2015-12-15 DIAGNOSIS — M150 Primary generalized (osteo)arthritis: Secondary | ICD-10-CM | POA: Diagnosis not present

## 2015-12-19 DIAGNOSIS — M150 Primary generalized (osteo)arthritis: Secondary | ICD-10-CM | POA: Diagnosis not present

## 2015-12-21 DIAGNOSIS — M150 Primary generalized (osteo)arthritis: Secondary | ICD-10-CM | POA: Diagnosis not present

## 2015-12-23 ENCOUNTER — Other Ambulatory Visit: Payer: Self-pay | Admitting: Family Medicine

## 2015-12-26 DIAGNOSIS — M150 Primary generalized (osteo)arthritis: Secondary | ICD-10-CM | POA: Diagnosis not present

## 2015-12-28 DIAGNOSIS — M150 Primary generalized (osteo)arthritis: Secondary | ICD-10-CM | POA: Diagnosis not present

## 2016-01-02 DIAGNOSIS — M150 Primary generalized (osteo)arthritis: Secondary | ICD-10-CM | POA: Diagnosis not present

## 2016-01-04 DIAGNOSIS — M150 Primary generalized (osteo)arthritis: Secondary | ICD-10-CM | POA: Diagnosis not present

## 2016-01-09 DIAGNOSIS — M150 Primary generalized (osteo)arthritis: Secondary | ICD-10-CM | POA: Diagnosis not present

## 2016-01-11 DIAGNOSIS — M150 Primary generalized (osteo)arthritis: Secondary | ICD-10-CM | POA: Diagnosis not present

## 2016-01-15 DIAGNOSIS — L723 Sebaceous cyst: Secondary | ICD-10-CM | POA: Diagnosis not present

## 2016-01-15 DIAGNOSIS — Z85828 Personal history of other malignant neoplasm of skin: Secondary | ICD-10-CM | POA: Diagnosis not present

## 2016-01-15 DIAGNOSIS — L603 Nail dystrophy: Secondary | ICD-10-CM | POA: Diagnosis not present

## 2016-01-15 DIAGNOSIS — L821 Other seborrheic keratosis: Secondary | ICD-10-CM | POA: Diagnosis not present

## 2016-01-15 DIAGNOSIS — L719 Rosacea, unspecified: Secondary | ICD-10-CM | POA: Diagnosis not present

## 2016-01-15 DIAGNOSIS — D225 Melanocytic nevi of trunk: Secondary | ICD-10-CM | POA: Diagnosis not present

## 2016-01-15 DIAGNOSIS — L219 Seborrheic dermatitis, unspecified: Secondary | ICD-10-CM | POA: Diagnosis not present

## 2016-01-16 DIAGNOSIS — M150 Primary generalized (osteo)arthritis: Secondary | ICD-10-CM | POA: Diagnosis not present

## 2016-01-18 DIAGNOSIS — M150 Primary generalized (osteo)arthritis: Secondary | ICD-10-CM | POA: Diagnosis not present

## 2016-01-20 ENCOUNTER — Other Ambulatory Visit: Payer: Self-pay | Admitting: Family Medicine

## 2016-01-22 ENCOUNTER — Other Ambulatory Visit: Payer: Self-pay | Admitting: Podiatry

## 2016-01-22 ENCOUNTER — Encounter: Payer: Self-pay | Admitting: Podiatry

## 2016-01-22 ENCOUNTER — Ambulatory Visit (INDEPENDENT_AMBULATORY_CARE_PROVIDER_SITE_OTHER): Payer: Medicare Other | Admitting: Podiatry

## 2016-01-22 DIAGNOSIS — R52 Pain, unspecified: Secondary | ICD-10-CM

## 2016-01-22 DIAGNOSIS — M79676 Pain in unspecified toe(s): Secondary | ICD-10-CM | POA: Diagnosis not present

## 2016-01-22 DIAGNOSIS — B351 Tinea unguium: Secondary | ICD-10-CM | POA: Diagnosis not present

## 2016-01-22 MED ORDER — TERBINAFINE HCL 250 MG PO TABS: 250.0000 mg | ORAL_TABLET | Freq: Every day | ORAL | Status: DC

## 2016-01-22 NOTE — Patient Instructions (Signed)

## 2016-01-23 ENCOUNTER — Encounter: Payer: Self-pay | Admitting: Family Medicine

## 2016-01-23 NOTE — Progress Notes (Signed)
Patient ID: SANDRAL STEINBRECHER, female   DOB: 1941-03-07, 75 y.o.   MRN: [id number]  Subjective: 75 y.o. female returns the office today for painful, elongated, thickened toenails which she cannot trim herself. She also states the nails continue to be thick, discolored. She is using formula 3 for the last several months and she has not noticed much improvment. The nails do get painful, mostly with shoegear. Denies any acute changes since last appointment and no new complaints today. Denies any systemic complaints such as fevers, chills, nausea, vomiting.   Objective: AAO 3, NAD DP/PT pulses palpable, CRT less than 3 seconds Protective sensation intact with Simms Weinstein monofilament Nails hypertrophic, dystrophic, elongated, brittle, discolored 10. There is tenderness overlying the nails 1-5 bilaterally. The left hallux nail is growing in slowly. There is no surrounding erythema or drainage along the nail sites. There has not been much change to the nails since last appointment.  No open lesions or pre-ulcerative lesions are identified. No other areas of tenderness bilateral lower extremities. No overlying edema, erythema, increased warmth. No pain with calf compression, swelling, warmth, erythema.  Assessment: Patient presents with symptomatic onychomycosis  Plan: -Treatment options including alternatives, risks, complications were discussed -Nails sharply debrided 10 without complication/bleeding - Again had a long discussion with the patient regarding treatment options for onychomycosis. At this point since she has tried multiple over-the-counter treatment for angulate the symptoms I discussed oral therapy. Discussed Lamisil. Discussed side effects the medicine as well as possible interactions with her other medical conditions a medications and she understands this and wishes to proceed. Blood work is ordered today for liver function tests and CBC as well as 30 days of Lamisil. Did not start the  medicine to call her with the results of the blood work. She verbally understood this.  -Discussed daily foot inspection. If there are any changes, to call the office immediately.  -Follow-up in 21 month or sooner if any problems are to arise. In the meantime, encouraged to call the office with any questions, concerns, changes symptoms.  Celesta Gentile, DPM

## 2016-01-24 DIAGNOSIS — H43813 Vitreous degeneration, bilateral: Secondary | ICD-10-CM | POA: Diagnosis not present

## 2016-01-24 DIAGNOSIS — H5000 Unspecified esotropia: Secondary | ICD-10-CM | POA: Diagnosis not present

## 2016-01-24 DIAGNOSIS — H35371 Puckering of macula, right eye: Secondary | ICD-10-CM | POA: Diagnosis not present

## 2016-01-24 DIAGNOSIS — H5703 Miosis: Secondary | ICD-10-CM | POA: Diagnosis not present

## 2016-01-24 DIAGNOSIS — H401112 Primary open-angle glaucoma, right eye, moderate stage: Secondary | ICD-10-CM | POA: Diagnosis not present

## 2016-01-24 DIAGNOSIS — H401122 Primary open-angle glaucoma, left eye, moderate stage: Secondary | ICD-10-CM | POA: Diagnosis not present

## 2016-01-24 LAB — CBC WITH DIFFERENTIAL
BASOS ABS: 0 10*3/uL (ref 0.0–0.2)
Basos: 1 %
EOS (ABSOLUTE): 0.1 10*3/uL (ref 0.0–0.4)
Eos: 2 %
Hematocrit: 40.9 % (ref 34.0–46.6)
Hemoglobin: 13.3 g/dL (ref 11.1–15.9)
IMMATURE GRANS (ABS): 0 10*3/uL (ref 0.0–0.1)
Immature Granulocytes: 0 %
LYMPHS ABS: 1.7 10*3/uL (ref 0.7–3.1)
LYMPHS: 28 %
MCH: 28.9 pg (ref 26.6–33.0)
MCHC: 32.5 g/dL (ref 31.5–35.7)
MCV: 89 fL (ref 79–97)
MONOS ABS: 0.4 10*3/uL (ref 0.1–0.9)
Monocytes: 7 %
NEUTROS ABS: 4 10*3/uL (ref 1.4–7.0)
NEUTROS PCT: 62 %
RBC: 4.6 x10E6/uL (ref 3.77–5.28)
RDW: 14.8 % (ref 12.3–15.4)
WBC: 6.3 10*3/uL (ref 3.4–10.8)

## 2016-01-24 LAB — HEPATIC FUNCTION PANEL (6)
ALBUMIN: 4.4 g/dL (ref 3.5–4.8)
ALT: 18 IU/L (ref 0–32)
AST: 20 IU/L (ref 0–40)
Alkaline Phosphatase: 72 IU/L (ref 39–117)
Bilirubin Total: 0.4 mg/dL (ref 0.0–1.2)
Bilirubin, Direct: 0.15 mg/dL (ref 0.00–0.40)

## 2016-01-29 DIAGNOSIS — Z803 Family history of malignant neoplasm of breast: Secondary | ICD-10-CM | POA: Diagnosis not present

## 2016-01-29 DIAGNOSIS — M8589 Other specified disorders of bone density and structure, multiple sites: Secondary | ICD-10-CM | POA: Diagnosis not present

## 2016-01-29 DIAGNOSIS — Z1231 Encounter for screening mammogram for malignant neoplasm of breast: Secondary | ICD-10-CM | POA: Diagnosis not present

## 2016-01-29 LAB — HM MAMMOGRAPHY: HM MAMMO: NORMAL (ref 0–4)

## 2016-01-29 LAB — HM DEXA SCAN

## 2016-01-30 ENCOUNTER — Telehealth: Payer: Self-pay | Admitting: *Deleted

## 2016-01-30 ENCOUNTER — Encounter: Payer: Self-pay | Admitting: Family Medicine

## 2016-01-30 NOTE — Telephone Encounter (Addendum)
-----   Message from Trula Slade, DPM sent at 01/29/2016  4:34 PM EDT ----- Can you let her know that she can start lamisil. Blood work is ok. Monitor cymbalta.   01/30/2016-Informed pt of Dr. Leigh Aurora orders, and transferred pt to schedulers to set up 30 day appt.  Faxed to [employer]. Pt states she has already purchased the Terbinafine not to worry about the prior authorization.

## 2016-02-02 ENCOUNTER — Encounter: Payer: Self-pay | Admitting: Family Medicine

## 2016-02-23 ENCOUNTER — Encounter: Payer: Self-pay | Admitting: Podiatry

## 2016-02-23 ENCOUNTER — Ambulatory Visit (INDEPENDENT_AMBULATORY_CARE_PROVIDER_SITE_OTHER): Payer: Medicare Other | Admitting: Podiatry

## 2016-02-23 DIAGNOSIS — B351 Tinea unguium: Secondary | ICD-10-CM

## 2016-02-23 MED ORDER — TERBINAFINE HCL 250 MG PO TABS: 250.0000 mg | ORAL_TABLET | Freq: Every day | ORAL | Status: DC

## 2016-02-27 NOTE — Progress Notes (Signed)
Patient ID: Tanya Leon, female   DOB: Mar 15, 1941, 75 y.o.   MRN: [id number]  Subjective: 75 year old female presents the office today 4 weeks after starting Lamisil. She denies any side effects the medicine. She is not noticed much change her toenails. No pain to the toenails no surrounding redness or drainage. Denies any systemic complaints such as fevers, chills, nausea, vomiting. No acute changes since last appointment, and no other complaints at this time.   Objective: AAO x3, NAD DP/PT pulses palpable bilaterally, CRT less than 3 seconds S continue be hypertrophic, dystrophic, discolored. There is no tenderness the nails there is no swelling redness or drainage. No areas of pinpoint bony tenderness. MMT 5/5, ROM WNL. No edema, erythema, increase in warmth to bilateral lower extremities.  No open lesions or pre-ulcerative lesions.  No pain with calf compression, swelling, warmth, erythema  Assessment: Onychomycosis currently on Lamisil  Plan: -All treatment options discussed with the patient including all alternatives, risks, complications.  -As she is not having side effects we'll continue Lamisil for 60 more days which is refilled today. Also prior to starting the refill she will get LFT and CBC which was also ordered today. Continue to monitor for any side effects. I'll see her back in 2 months or sooner if any issues are to arise. -Patient encouraged to call the office with any questions, concerns, change in symptoms.   Celesta Gentile, DPM

## 2016-02-28 DIAGNOSIS — B351 Tinea unguium: Secondary | ICD-10-CM | POA: Diagnosis not present

## 2016-02-29 LAB — HEPATIC FUNCTION PANEL
ALBUMIN: 4.4 g/dL (ref 3.5–4.8)
ALK PHOS: 77 IU/L (ref 39–117)
ALT: 14 IU/L (ref 0–32)
AST: 17 IU/L (ref 0–40)
Bilirubin Total: 0.3 mg/dL (ref 0.0–1.2)
Bilirubin, Direct: 0.1 mg/dL (ref 0.00–0.40)
TOTAL PROTEIN: 6.5 g/dL (ref 6.0–8.5)

## 2016-03-01 ENCOUNTER — Telehealth: Payer: Self-pay | Admitting: *Deleted

## 2016-03-01 NOTE — Telephone Encounter (Addendum)
-----   Message from Trula Slade, DPM sent at 02/29/2016  5:50 PM EDT ----- LFT OK to continue lamsil. Please let her know. 03/01/2016-Dr. Wagoner's orders called to pt.

## 2016-03-06 ENCOUNTER — Encounter: Payer: Self-pay | Admitting: Family Medicine

## 2016-03-07 NOTE — Telephone Encounter (Signed)
Can we send in [place] 5mg  and resubmit to insurance with prior auth?

## 2016-03-08 ENCOUNTER — Other Ambulatory Visit: Payer: Self-pay | Admitting: Obstetrics and Gynecology

## 2016-03-08 DIAGNOSIS — N8110 Cystocele, unspecified: Secondary | ICD-10-CM | POA: Diagnosis not present

## 2016-03-08 DIAGNOSIS — Z124 Encounter for screening for malignant neoplasm of cervix: Secondary | ICD-10-CM | POA: Diagnosis not present

## 2016-03-08 LAB — HM PAP SMEAR

## 2016-03-11 ENCOUNTER — Encounter: Payer: Self-pay | Admitting: Family Medicine

## 2016-03-11 LAB — CYTOLOGY - PAP

## 2016-03-14 ENCOUNTER — Other Ambulatory Visit: Payer: Self-pay | Admitting: *Deleted

## 2016-03-14 MED ORDER — ZOLPIDEM TARTRATE 5 MG PO TABS
5.0000 mg | ORAL_TABLET | Freq: Every evening | ORAL | Status: DC | PRN
Start: 2016-03-14 — End: 2016-06-04

## 2016-03-18 ENCOUNTER — Telehealth: Payer: Self-pay | Admitting: *Deleted

## 2016-03-18 NOTE — Telephone Encounter (Signed)
Recieved prior authorization request from [facility] for Zolpidem 5 mg.  PA completed through [facility]

## 2016-03-20 NOTE — Telephone Encounter (Signed)
PA was approved medication from 03/18/16 through 03/18/2017.

## 2016-04-22 ENCOUNTER — Ambulatory Visit (INDEPENDENT_AMBULATORY_CARE_PROVIDER_SITE_OTHER): Payer: Medicare Other | Admitting: Podiatry

## 2016-04-22 ENCOUNTER — Encounter: Payer: Self-pay | Admitting: Podiatry

## 2016-04-22 DIAGNOSIS — R52 Pain, unspecified: Secondary | ICD-10-CM | POA: Diagnosis not present

## 2016-04-22 DIAGNOSIS — B351 Tinea unguium: Secondary | ICD-10-CM | POA: Diagnosis not present

## 2016-04-22 DIAGNOSIS — L603 Nail dystrophy: Secondary | ICD-10-CM

## 2016-04-22 DIAGNOSIS — M79676 Pain in unspecified toe(s): Secondary | ICD-10-CM | POA: Diagnosis not present

## 2016-04-22 DIAGNOSIS — Z79899 Other long term (current) drug therapy: Secondary | ICD-10-CM

## 2016-04-22 MED ORDER — TERBINAFINE HCL 250 MG PO TABS: 250.0000 mg | ORAL_TABLET | Freq: Every day | ORAL | 0 refills | Status: DC

## 2016-04-22 NOTE — Patient Instructions (Signed)

## 2016-04-23 ENCOUNTER — Telehealth: Payer: Self-pay | Admitting: *Deleted

## 2016-04-23 LAB — HEPATIC FUNCTION PANEL
ALBUMIN: 4.4 g/dL (ref 3.6–5.1)
ALT: 13 U/L (ref 6–29)
AST: 15 U/L (ref 10–35)
Alkaline Phosphatase: 71 U/L (ref 33–130)
Bilirubin, Direct: 0.1 mg/dL (ref <=0.2)
Indirect Bilirubin: 0.4 mg/dL (ref 0.2–1.2)
TOTAL PROTEIN: 6.4 g/dL (ref 6.1–8.1)
Total Bilirubin: 0.5 mg/dL (ref 0.2–1.2)

## 2016-04-23 LAB — CBC WITH DIFFERENTIAL/PLATELET
BASOS PCT: 0 %
Basophils Absolute: 0 cells/uL (ref 0–200)
EOS ABS: 104 {cells}/uL (ref 15–500)
Eosinophils Relative: 2 %
HEMATOCRIT: 38.4 % (ref 35.0–45.0)
Hemoglobin: 12.8 g/dL (ref 11.7–15.5)
LYMPHS PCT: 29 %
Lymphs Abs: 1508 cells/uL (ref 850–3900)
MCH: 29.6 pg (ref 27.0–33.0)
MCHC: 33.3 g/dL (ref 32.0–36.0)
MCV: 88.9 fL (ref 80.0–100.0)
MONO ABS: 312 {cells}/uL (ref 200–950)
MONOS PCT: 6 %
MPV: 11 fL (ref 7.5–12.5)
NEUTROS PCT: 63 %
Neutro Abs: 3276 cells/uL (ref 1500–7800)
PLATELETS: 166 10*3/uL (ref 140–400)
RBC: 4.32 MIL/uL (ref 3.80–5.10)
RDW: 13.9 % (ref 11.0–15.0)
WBC: 5.2 10*3/uL (ref 3.8–10.8)

## 2016-04-23 NOTE — Telephone Encounter (Addendum)
-----   Message from Trula Slade, DPM sent at 04/23/2016 12:15 PM EDT ----- Lab work normal, can continue lamisil. Please let her know. Informed pt of Dr. Leigh Aurora orders.

## 2016-04-23 NOTE — Progress Notes (Signed)
Patient ID: Tanya Leon, female   DOB: [birth year], 75 y.o.   MRN: [id number]  Subjective: 75 year old female presents the office today 4 weeks after starting Lamisil. She denies any side effects the medicine. She is not noticed much change her toenails. She states that her right big toenail is not growing. The nails do get painful with pressure and shoes. Denies any systemic complaints such as fevers, chills, nausea, vomiting. No acute changes since last appointment, and no other complaints at this time.   Objective: AAO x3, NAD DP/PT pulses palpable bilaterally, CRT less than 3 seconds Nails continue be hypertrophic, dystrophic, discolored. There is no tenderness the nails there is no swelling redness or drainage. Bilateral hallux nail are thick and short. Tenderness to nails 1-5 bilaterally.  No areas of pinpoint bony tenderness. MMT 5/5, ROM WNL. No edema, erythema, increase in warmth to bilateral lower extremities.  No open lesions or pre-ulcerative lesions.  No pain with calf compression, swelling, warmth, erythema  Assessment: Symptomatic onychomycosis  Plan: -All treatment options discussed with the patient including all alternatives, risks, complications.  -At this time she is about done with 3 months of lamisil. There has been some, although minimal, improvement in the nails. There is some mild clearing along the proximal nail borders. Will continue for one more month. This was prescribed today as well as repeat blood work. Continue to monitor for side effects of the medication.  -Urea cream daily to help with thinning of the hallux nails -Nails debrided x 10 without complications or bleeding. -Follow-up in 3 months or sooner if any problems arise. In the meantime, encouraged to call the office with any questions, concerns, change in symptoms.   Celesta Gentile, DPM

## 2016-04-29 ENCOUNTER — Ambulatory Visit: Payer: Medicare Other | Admitting: Podiatry

## 2016-05-02 DIAGNOSIS — M25562 Pain in left knee: Secondary | ICD-10-CM | POA: Diagnosis not present

## 2016-05-20 ENCOUNTER — Ambulatory Visit (INDEPENDENT_AMBULATORY_CARE_PROVIDER_SITE_OTHER): Payer: Medicare Other | Admitting: Podiatry

## 2016-05-20 ENCOUNTER — Encounter: Payer: Self-pay | Admitting: Podiatry

## 2016-05-20 DIAGNOSIS — B351 Tinea unguium: Secondary | ICD-10-CM

## 2016-05-21 NOTE — Progress Notes (Signed)
Patient ID: Tanya Leon, female   DOB: 10-Jan-1941, 75 y.o.   MRN: [id number]  Subjective: 75 year old female presents the office today after finishing Lamisil. She is also been using urea cream to her toenails. She states that she believes her toenails have gotten better since last appointment. Both of her big toenails are somewhat loose but they're not causing any pain and she has not had any swelling or redness or any drainage.  Denies any systemic complaints such as fevers, chills, nausea, vomiting. No acute changes since last appointment, and no other complaints at this time.   Objective: AAO x3, NAD DP/PT pulses palpable bilaterally, CRT less than 3 seconds Nails continue be hypertrophic, dystrophic, discolored . Bilateral hallux nails. They're somewhat loosened nail bed bed and firmly adhered proximally. There is no pain to the toenails presents for any redness or drainage or any swelling or any signs of infection. The nails appear to be improved in color. No areas of pinpoint bony tenderness. MMT 5/5, ROM WNL. No edema, erythema, increase in warmth to bilateral lower extremities.  No open lesions or pre-ulcerative lesions.  No pain with calf compression, swelling, warmth, erythema  Assessment: onychomycosis  Plan: -All treatment options discussed with the patient including all alternatives, risks, complications.  -At this time continue the topical urea topical antifungal. Nails do appear to look much improved compared to last appointment and color all they still remained thick. If symptoms continue possible laser treatment.  -Follow-up in 3 months or sooner if any issues are to arise. Call any questions or concerns in the meantime.  Celesta Gentile, DPM

## 2016-05-23 ENCOUNTER — Other Ambulatory Visit: Payer: Self-pay | Admitting: Family Medicine

## 2016-06-04 ENCOUNTER — Ambulatory Visit (INDEPENDENT_AMBULATORY_CARE_PROVIDER_SITE_OTHER): Payer: Medicare Other | Admitting: Family Medicine

## 2016-06-04 ENCOUNTER — Encounter: Payer: Self-pay | Admitting: Family Medicine

## 2016-06-04 VITALS — BP 98/70 | HR 56 | Temp 97.8°F | Ht 62.25 in | Wt 132.0 lb

## 2016-06-04 DIAGNOSIS — Z0000 Encounter for general adult medical examination without abnormal findings: Secondary | ICD-10-CM | POA: Diagnosis not present

## 2016-06-04 DIAGNOSIS — Z79899 Other long term (current) drug therapy: Secondary | ICD-10-CM

## 2016-06-04 DIAGNOSIS — D696 Thrombocytopenia, unspecified: Secondary | ICD-10-CM | POA: Diagnosis not present

## 2016-06-04 DIAGNOSIS — M48061 Spinal stenosis, lumbar region without neurogenic claudication: Secondary | ICD-10-CM

## 2016-06-04 DIAGNOSIS — M8580 Other specified disorders of bone density and structure, unspecified site: Secondary | ICD-10-CM | POA: Diagnosis not present

## 2016-06-04 DIAGNOSIS — G4700 Insomnia, unspecified: Secondary | ICD-10-CM

## 2016-06-04 DIAGNOSIS — M4806 Spinal stenosis, lumbar region: Secondary | ICD-10-CM

## 2016-06-04 DIAGNOSIS — F329 Major depressive disorder, single episode, unspecified: Secondary | ICD-10-CM | POA: Diagnosis not present

## 2016-06-04 DIAGNOSIS — F32A Depression, unspecified: Secondary | ICD-10-CM

## 2016-06-04 DIAGNOSIS — Z8349 Family history of other endocrine, nutritional and metabolic diseases: Secondary | ICD-10-CM | POA: Diagnosis not present

## 2016-06-04 DIAGNOSIS — Z83438 Family history of other disorder of lipoprotein metabolism and other lipidemia: Secondary | ICD-10-CM

## 2016-06-04 LAB — CBC
HCT: 40.9 % (ref 36.0–46.0)
Hemoglobin: 13.8 g/dL (ref 12.0–15.0)
MCHC: 33.7 g/dL (ref 30.0–36.0)
MCV: 91.2 fl (ref 78.0–100.0)
PLATELETS: 145 10*3/uL — AB (ref 150.0–400.0)
RBC: 4.48 Mil/uL (ref 3.87–5.11)
RDW: 13.8 % (ref 11.5–15.5)
WBC: 4 10*3/uL (ref 4.0–10.5)

## 2016-06-04 LAB — COMPREHENSIVE METABOLIC PANEL
ALBUMIN: 4.4 g/dL (ref 3.5–5.2)
ALK PHOS: 60 U/L (ref 39–117)
ALT: 14 U/L (ref 0–35)
AST: 15 U/L (ref 0–37)
BILIRUBIN TOTAL: 0.8 mg/dL (ref 0.2–1.2)
BUN: 15 mg/dL (ref 6–23)
CO2: 30 mEq/L (ref 19–32)
CREATININE: 0.79 mg/dL (ref 0.40–1.20)
Calcium: 9 mg/dL (ref 8.4–10.5)
Chloride: 101 mEq/L (ref 96–112)
GFR: 75.11 mL/min (ref >60.00)
Glucose, Bld: 85 mg/dL (ref 70–99)
Potassium: 3.9 mEq/L (ref 3.5–5.1)
SODIUM: 136 meq/L (ref 135–145)
TOTAL PROTEIN: 6.8 g/dL (ref 6.0–8.3)

## 2016-06-04 LAB — LIPID PANEL
CHOLESTEROL: 180 mg/dL (ref 0–200)
HDL: 90 mg/dL (ref >39.00)
LDL CALC: 80 mg/dL (ref 0–99)
NonHDL: 89.67
Total CHOL/HDL Ratio: 2
Triglycerides: 47 mg/dL (ref 0.0–149.0)
VLDL: 9.4 mg/dL (ref 0.0–40.0)

## 2016-06-04 MED ORDER — ZOLPIDEM TARTRATE 5 MG PO TABS: 5.0000 mg | ORAL_TABLET | Freq: Every evening | ORAL | 5 refills | Status: DC | PRN

## 2016-06-04 MED ORDER — PREGABALIN 25 MG PO CAPS: ORAL_CAPSULE | ORAL | 3 refills | Status: DC

## 2016-06-04 NOTE — Assessment & Plan Note (Signed)
S: remains on calcium vitamin D - we had discussed dietary only in the past but did have worsening so will increase calcium to 1200mg  daily. Step aerobics for weight bearing exercise. Admits dietary calcium low A/P: will follow repeat bone density

## 2016-06-04 NOTE — Progress Notes (Addendum)
Phone: [phone]  Subjective:  Patient presents today for their annual wellness visit.    Preventive Screening-Counseling & Management  Smoking Status: Never Smoker Second Hand Smoking status: No smokers in home  Risk Factors Regular exercise: step aerobics 3 days a week last year. This year is maintaining Diet: reasonable, weight stable- slight fluctuation, counseling provided Wt Readings from Last 3 Encounters:  06/04/16 132 lb (59.9 kg)  11/24/15 135 lb (61.2 kg)  11/15/15 134 lb (60.8 kg)     Fall Risk: None  Fall Risk  06/04/2016 05/30/2015 01/24/2014 01/24/2014 01/18/2013  Falls in the past year? No No No No No    Cardiac risk factors:  advanced age (older than 51 for men, 42 for women)  Hyperlipidemia - none, but last labs 2015 Lab Results  Component Value Date   CHOL 156 01/24/2014   HDL 82.70 01/24/2014   LDLCALC 67 01/24/2014   TRIG 34.0 01/24/2014   CHOLHDL 2 01/24/2014  No diabetes.  No hypertension Family History: MI age 66 mother   Depression Screen None. PHQ2 0  Depression screen [facility] 2/9 06/04/2016 05/30/2015 01/24/2014 01/24/2014 01/18/2013  Decreased Interest 0 0 0 0 0  Down, Depressed, Hopeless - 0 0 0 0  PHQ - 2 Score 0 0 0 0 0    Activities of Daily Living Independent ADLs and IADLs   Hearing Difficulties: -patient declines and does not want to pursue testing, listens with higher volume at movies  Cognitive Testing No reported trouble.   Normal 3 word recall  List the Names of Other Physician/Practitioners you currently use: 1.Jari Pigg, Dermatology. At least yearly 2. Optho Dr. Herbert Deaner 3. Dr. Ninfa Linden- knee surgery in February 2017 4. GYN [facility]- Dr. Philis Pique  Immunization History  Administered Date(s) Administered  . H1N1 09/13/2008  . Influenza Whole 06/24/2008, 06/26/2009, 08/01/2010  . Influenza, Seasonal, Injecte, Preservative Fre 07/06/2013  . Influenza,inj,Quad PF,36+ Mos 07/07/2014, 07/11/2015  .  Pneumococcal Conjugate-13 05/30/2015  . Pneumococcal Polysaccharide-23 12/30/2005  . Td 08/01/2010  . Zoster 10/08/2007   Required Immunizations needed today : offered flu, will get in october  Screening tests- up to date. Will need repeat colonoscopy 2018. Mammogram in may 2017- will continue on estrogen therapy  ROS- No pertinent positives discovered in course of AWV ROS- no si/hi. No chest pain or shortness of breath. No headache or blurry vision.   The following were reviewed and entered/updated in epic: Past Medical History:  Diagnosis Date  . Arthritis   . Cancer (HCC)    hx of skin cancer   . Colon polyps    adenomatous  . Complication of anesthesia    " pt stated they gave me too much - kept having to be reminded to breathe   . Constipation    opiod induced associated with back pain  . Depression    hx of   . Diverticulosis   . DIVERTICULOSIS, COLON 01/13/2009  . Heart murmur    benign, dagnosed around age 82,prophylactic antibiotic  . Low back pain   . Lumbar spondylolysis   . Osteopenia   . Pneumonia    hx of as a baby    Patient Active Problem List   Diagnosis Date Noted  . Basal cell carcinoma 07/07/2014    Priority: Medium  . Depression 06/01/2014    Priority: Medium  . SPINAL STENOSIS, LUMBAR 01/25/2008    Priority: Medium  . INSOMNIA, CHRONIC 07/22/2007    Priority: Medium  . Thrombocytopenia ([place]) 05/03/2015  Priority: Low  . 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 Glaucoma 06/01/2014    Priority: Low  . 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 COLONIC POLYPS 01/13/2009    Priority: Low  . 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 ALLERGIC RHINITIS 05/21/2007    Priority: Low  . Osteopenia 02/26/2007    Priority: Low  . Failed left unicompartmental knee replacement ([place]) 11/24/2015  . Status post total left knee replacement 11/24/2015   Past Surgical History:  Procedure  Laterality Date  . ABDOMINAL HERNIA REPAIR Right    incisional  . ABDOMINAL HYSTERECTOMY     with ovaries  . cataract left lens replacement     2016  . EYE MUSCLE SURGERY    . GLAUCOMA SURGERY Left 05/12/14  . incisional ab  05/28/11  . JOINT REPLACEMENT     right hip replacement   . LUMBAR FUSION     L4-L5  . PARTIAL KNEE ARTHROPLASTY Right   . PARTIAL KNEE ARTHROPLASTY Left   . perforated deverticular abscess/laporotomy and colostomy     depression after this  . stribismus eye surgery Right   . take down of colostomy    . tonsillectomly    . TOTAL KNEE REVISION Left 11/24/2015   Procedure: Conversion from left lateral unicompartmental knee arthroplasty to left total knee arthroplasty;  Surgeon: Mcarthur Rossetti, MD;  Location: [facility];  Service: Orthopedics;  Laterality: Left;    Family History  Problem Relation Age of Onset  . Alzheimer's disease Father   . Heart disease Father     mitral valve replaced. unknown reason  . Breast cancer Mother     4  . Prostate cancer Brother   . Colon polyps Mother   . Diabetes Mother   . Heart disease Mother 10    CHF, MI 71  . Heart attack Mother     x 2    Medications- reviewed and updated Current Outpatient Prescriptions  Medication Sig Dispense Refill  . AMBULATORY NON FORMULARY MEDICATION Evening Primerose Oil 1300 mg take 1 by mouth twice a day    . aspirin-acetaminophen-caffeine (EXCEDRIN EXTRA STRENGTH) 250-250-65 MG per tablet Take 2 tablets by mouth every 6 (six) hours as needed for headache. Reported on 11/03/2015    . azithromycin (AZASITE) 1 % ophthalmic solution Place 1 drop into both eyes every 30 (thirty) days. One week per month    . carboxymethylcellulose (REFRESH PLUS) 0.5 % SOLN Place 1 drop into both eyes daily as needed (Dry eyes.).     Marland Kitchen Cholecalciferol (VITAMIN D) 1000 UNITS capsule Take 2,000 Units by mouth every evening.     . Ciclopirox 1 % shampoo Apply 1 each topically every Friday.     .  cycloSPORINE (RESTASIS) 0.05 % ophthalmic emulsion Place 1 drop into both eyes 2 (two) times daily.     . DULoxetine (CYMBALTA) 60 MG capsule TAKE ONE CAPSULE BY MOUTH EVERY DAY 90 capsule 3  . estradiol (VIVELLE-DOT) 0.05 MG/24HR Place 1 patch onto the skin 2 (two) times a week. Reported on 11/03/2015 on thursdays and Sundays    . fluticasone (FLONASE) 50 MCG/ACT nasal spray USE 2 SPRAYS IN EACH NOSTRIL DAILY 16 g 5  . Lutein 20 MG TABS Take 1 tablet by mouth every evening.     . Multiple Vitamins-Minerals (ICAPS AREDS 2) CAPS Take 1 capsule by mouth 2 (two) times  daily.    . Multiple Vitamins-Minerals (MULTIVITAMIN PO) Take 1 tablet by mouth every morning.     . Olopatadine HCl (PATADAY) 0.2 % SOLN Place 1 drop into both eyes daily as needed (dry/itchy eyes.).     Marland Kitchen omega-3 acid ethyl esters (LOVAZA) 1 G capsule Take 1 g by mouth 2 (two) times daily.     . polyethylene glycol powder (GLYCOLAX/MIRALAX) powder Take 17 g by mouth every morning.     . pregabalin (LYRICA) 25 MG capsule Take 1 tablet in the morning and take 2 tablets in the evening 270 capsule 3  . Probiotic Product (ALIGN) 4 MG CAPS Take 1 capsule by mouth every morning.     Marland Kitchen tiZANidine (ZANAFLEX) 2 MG tablet TAKE 1 TABLET BY MOUTH AT BEDTIME 30 tablet 5  . zolpidem (AMBIEN) 5 MG tablet Take 1 tablet (5 mg total) by mouth at bedtime as needed for sleep. 30 tablet 5   No current facility-administered medications for this visit.     Allergies-reviewed and updated Allergies  Allergen Reactions  . Shellfish Allergy Swelling    Social History   Social History  . Marital status: Married    Spouse name: N/A  . Number of children: 2  . Years of education: N/A   Occupational History  . cpa/retired   .  Retired   Social History Main Topics  . Smoking status: Never Smoker  . Smokeless tobacco: Never Used  . Alcohol use 0.0 oz/week     Comment: 1 glass of wine daily   . Drug use: No  . Sexual activity: Yes   Other Topics  Concern  . None   Social History Narrative   Married 54 years in 2015. 2 kids. 4 grandkids.       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 Previously [employer].     Objective: BP 98/70 (BP Location: Left Arm, Patient Position: Sitting, Cuff Size: Normal)   Pulse (!) 56   Temp 97.8 F (36.6 C) (Oral)   Ht 5' 2.25" (1.581 m)   Wt 132 lb (59.9 kg)   SpO2 97%   BMI 23.95 kg/m  Gen: NAD, resting comfortably HEENT: Mucous membranes are moist. Oropharynx normal CV: RRR no murmurs rubs or gallops Lungs: CTAB no crackles, wheeze, rhonchi Abdomen: soft/nontender/nondistended/normal bowel sounds. No rebound or guarding.  Ext: no edema Skin: warm, dry, no rash Neuro: grossly normal, moves all extremities, PERRLA  Assessment/Plan:  AWV completed- discussed recommended screenings anddocumented any personalized health advice and referrals for preventive counseling. See AVS as well which was given to patient.   Status of chronic or acute concerns   SPINAL STENOSIS, LUMBAR S: prior surgery l4-l5. Still gets occasional tightness in bnac. zanaflex prn with lyrica 50mg  BID.  A/P: she has bene doing really well- wants to trial 25mg  in AM, 50 in PM of lyrica then consider 25, 25 at next visit  INSOMNIA, CHRONIC S: Continues use of ambien. No abnormal dreams, sleep walking, difficulty driving.  A/P: refilled today- doing well  Depression S: since 2007- controlled on cymbalta.  A/P: wants to titrate down, we discussed changing one med at a time and since we are reducing lyrica- hold off on cymbalta change until we find lowest reasonable dose of lyrica  Thrombocytopenia S: thrombocytopenia- mild intermittent but not on last check.  A/P: will update again today  Osteopenia S: remains on calcium vitamin D - we had  discussed dietary only in the past but did have worsening so will increase calcium to 1200mg  daily. Step aerobics for weight bearing  exercise. Admits dietary calcium low A/P: will follow repeat bone density   Return in about 6 months (around 12/02/2016) for follow up- or sooner if needed.    Orders Placed This Encounter  Procedures  . CBC    [place]  . Comprehensive metabolic panel    [place]    Order Specific Question:   Has the patient fasted?    Answer:   No  . Lipid panel    [place]    Order Specific Question:   Has the patient fasted?    Answer:   No    Meds ordered this encounter  Medications  . zolpidem (AMBIEN) 5 MG tablet    Sig: Take 1 tablet (5 mg total) by mouth at bedtime as needed for sleep.    Dispense:  30 tablet    Refill:  5  . pregabalin (LYRICA) 25 MG capsule    Sig: Take 1 tablet in the morning and take 2 tablets in the evening    Dispense:  270 capsule    Refill:  3    Return precautions advised.  Garret Reddish, MD

## 2016-06-04 NOTE — Assessment & Plan Note (Signed)
S: Continues use of ambien. No abnormal dreams, sleep walking, difficulty driving.  A/P: refilled today- doing well

## 2016-06-04 NOTE — Patient Instructions (Addendum)
  Tanya Leon , Thank you for taking time to come for your Medicare Wellness Visit. I appreciate your ongoing commitment to your health goals. Please review the following plan we discussed and let me know if I can assist you in the future.   These are the goals we discussed: 1. Flu shot in October 2. Trial 25 mg in the morning of lyrica, continue 50mg  at night 3. Labs before you leave   This is a list of the screening recommended for you and due dates:  Health Maintenance  Topic Date Due  . Flu Shot  04/30/2016  . Colon Cancer Screening  05/27/2017  . Tetanus Vaccine  08/01/2020  . DEXA scan (bone density measurement)  Completed  . Shingles Vaccine  Completed  . Pneumonia vaccines  Completed

## 2016-06-04 NOTE — Progress Notes (Signed)
Pre visit review using our clinic review tool, if applicable. No additional management support is needed unless otherwise documented below in the visit note. 

## 2016-06-04 NOTE — Assessment & Plan Note (Signed)
S: since 2007- controlled on cymbalta.  A/P: wants to titrate down, we discussed changing one med at a time and since we are reducing lyrica- hold off on cymbalta change until we find lowest reasonable dose of lyrica

## 2016-06-04 NOTE — Assessment & Plan Note (Signed)
S: thrombocytopenia- mild intermittent but not on last check.  A/P: will update again today

## 2016-06-04 NOTE — Assessment & Plan Note (Signed)
S: prior surgery l4-l5. Still gets occasional tightness in bnac. zanaflex prn with lyrica 50mg  BID.  A/P: she has bene doing really well- wants to trial 25mg  in AM, 50 in PM of lyrica then consider 25, 25 at next visit

## 2016-07-01 ENCOUNTER — Ambulatory Visit (INDEPENDENT_AMBULATORY_CARE_PROVIDER_SITE_OTHER): Payer: Medicare Other | Admitting: Podiatry

## 2016-07-01 DIAGNOSIS — B351 Tinea unguium: Secondary | ICD-10-CM | POA: Diagnosis not present

## 2016-07-10 ENCOUNTER — Ambulatory Visit (INDEPENDENT_AMBULATORY_CARE_PROVIDER_SITE_OTHER): Payer: Medicare Other | Admitting: Family Medicine

## 2016-07-10 DIAGNOSIS — Z23 Encounter for immunization: Secondary | ICD-10-CM

## 2016-07-11 NOTE — Progress Notes (Signed)
Subjective: 75 year old female presents the office today for continued care of bilateral hallux onychodystrophy, onychomycosis. She states the left hallux tenderness but a falloff but she does not want me to taken off today. She states that she has been doing numerous treatments including urea cream, antifungal as well as soaking in vinegar. She is getting frustrated with the toenails. They're not causing any pain which she is not happy with with a look Denies any systemic complaints such as fevers, chills, nausea, vomiting. No acute changes since last appointment, and no other complaints at this time.   Objective: AAO x3, NAD DP/PT pulses palpable bilaterally, CRT less than 3 seconds Bilateral hallux toenails are dystrophic, discolored. The left hallux nail is very loose and only attached the very proximal nail border. There is no swelling, erythema, drainage or pus or any signs of infection. The right hallux toenail is reduced are thick, discolored and very thin. No swelling erythema or drainage. No tenderness. No open lesions or pre-ulcerative lesions.  No pain with calf compression, swelling, warmth, erythema  Assessment: Bilateral hallux onychodystrophy, onychomycosis  Plan: -All treatment options discussed with the patient including all alternatives, risks, complications.  -At this time both toenails on the hallux are very thin and dystrophic. I recommended a falloff any further treatment at this time of the toenails grow out. She does not want to do this actively lift each treatment. I discussed the vinegar soaks. She's had numerous treatment the toenails and I recommended hold off on this and let the toenails grow. -Patient encouraged to call the office with any questions, concerns, change in symptoms.   Celesta Gentile, DPM

## 2016-07-12 ENCOUNTER — Other Ambulatory Visit: Payer: Self-pay | Admitting: Family Medicine

## 2016-07-12 ENCOUNTER — Other Ambulatory Visit: Payer: Self-pay | Admitting: Podiatry

## 2016-08-02 DIAGNOSIS — B351 Tinea unguium: Secondary | ICD-10-CM

## 2016-09-02 ENCOUNTER — Encounter: Payer: Self-pay | Admitting: Podiatry

## 2016-09-02 ENCOUNTER — Ambulatory Visit (INDEPENDENT_AMBULATORY_CARE_PROVIDER_SITE_OTHER): Payer: Medicare Other | Admitting: Podiatry

## 2016-09-02 DIAGNOSIS — L603 Nail dystrophy: Secondary | ICD-10-CM

## 2016-09-02 DIAGNOSIS — B351 Tinea unguium: Secondary | ICD-10-CM | POA: Diagnosis not present

## 2016-09-02 NOTE — Progress Notes (Signed)
Subjective: 75 year old female presents the office today for continued care of bilateral hallux onychodystrophy, onychomycosis. She's been applying urea cream as well as topical antifungal medicine. She's been using topical antifungal for almost 2 years this point she is not noticed significant improvement. She does continue use the urea cream as well. I last appointment I discussed that we were doing many treatments this toenail not seen much improvement and I wanted to hold off on any treatment and see what happens to the toenails over the last several months however she does continue to apply all the medications. She is getting frustrated with the toenails look. She's having no pain with the nails and she denies any redness or drainage or any swelling. Denies any systemic complaints such as fevers, chills, nausea, vomiting. No acute changes since last appointment, and no other complaints at this time.   Objective: AAO x3, NAD; frustrated with her toenails DP/PT pulses palpable bilaterally, CRT less than 3 seconds Bilateral hallux toenails are dystrophic, discolored. There is minimal nail growth at this time on both hallux toenails. On the left side there is a very small piece of nail present on the proximal nail border which is discolored and hypertrophic and dystrophic. The right side there is minimal nail present as well. There is no tenderness the areas there is no ascending redness or drainage or any swelling. There is no open lesions or pre-ulcerative lesions. No pain with calf compression, swelling, warmth, erythema  Assessment: Bilateral hallux onychodystrophy, onychomycosis  Plan: -All treatment options discussed with the patient including all alternatives, risks, complications.  -I had a very long discussion with the patient in regards to treatment options this point. I also recommended a dermatology evaluation but she declined this. I offered a second opinion she declined. At this time I  recommended a hold off on the urea cream as there is minimal nail growth present. She can continue the topical antifungal however I don't believe this isn't help with this point. She is also proceeded on Lamisil. I will like for her to hold off on medications to see if the nails grow and what they look like. I also offered laser therapy to the patient that she did not want to do this today. I discussed removal of the rest of the toenail that is present but she declined this. She is frustrated with her toenails due to the appearance. I offered several options to her but she did not want to peruse them today. She was alone for today's appointment.   Celesta Gentile, DPM  -At this time both toenails on the hallux are very thin and dystrophic. I recommended a falloff any further treatment at this time of the toenails grow out. She does not want to do this actively lift each treatment. I discussed the vinegar soaks. She's had numerous treatment the toenails and I recommended hold off on this and let the toenails grow. -Patient encouraged to call the office with any questions, concerns, change in symptoms.   Celesta Gentile, DPM

## 2016-09-04 ENCOUNTER — Encounter: Payer: Self-pay | Admitting: Podiatry

## 2016-09-04 NOTE — Progress Notes (Signed)
Patient came into the office and picked up her medical records that she had requested. JMS

## 2016-09-05 ENCOUNTER — Encounter: Payer: Self-pay | Admitting: Family Medicine

## 2016-09-05 ENCOUNTER — Ambulatory Visit (INDEPENDENT_AMBULATORY_CARE_PROVIDER_SITE_OTHER): Payer: Medicare Other | Admitting: Family Medicine

## 2016-09-05 VITALS — BP 128/78 | HR 63 | Temp 97.9°F | Wt 138.2 lb

## 2016-09-05 DIAGNOSIS — M542 Cervicalgia: Secondary | ICD-10-CM

## 2016-09-05 NOTE — Patient Instructions (Signed)
Please continue Zanaflex as recommended.  A referral to PT has been provided for you. If symptoms do not improve with Zanaflex and PT or you develop new symptoms such as fever, radiating pain, numbness, tingling, or weakness, please follow up with Dr. Yong Channel.  Your symptoms appear to be related to muscle pain today.   Muscle Pain, Adult Muscle pain (myalgia) may be mild or severe. In most cases, the pain lasts only a short time and it goes away without treatment. It is normal to feel some muscle pain after starting a workout program. Muscles that have not been used often will be sore at first. Muscle pain may also be caused by many other things, including:  Overuse or muscle strain, especially if you are not in shape. This is the most common cause of muscle pain.  Injury.  Bruises.  Viruses, such as the flu.  Infectious diseases.  A chronic condition that causes muscle tenderness, fatigue, and headache (fibromyalgia).  A condition, such as lupus, in which the body's disease-fighting system attacks other organs in the body (autoimmune or rheumatologic diseases).  Certain drugs, including ACE inhibitors and statins. To diagnose the cause of your muscle pain, your health care provider will do a physical exam and ask questions about the pain and when it began. If you have not had muscle pain for very long, your health care provider may want to wait before doing much testing. If your muscle pain has lasted a long time, your health care provider may want to run tests right away. In some cases, this may include tests to rule out certain conditions or illnesses. Treatment for muscle pain depends on the cause. Home care is often enough to relieve muscle pain. Your health care provider may also prescribe anti-inflammatory medicine. Follow these instructions at home: Activity  If overuse is causing your muscle pain:  Slow down your activities until the pain goes away.  Do regular, gentle  exercises if you are not usually active.  Warm up before exercising. Stretch before and after exercising. This can help lower the risk of muscle pain.  Do not continue working out if the pain is very bad. Bad pain could mean that you have injured a muscle. Managing pain and discomfort  If directed, apply ice to the sore muscle:  Put ice in a plastic bag.  Place a towel between your skin and the bag.  Leave the ice on for 20 minutes, 2-3 times a day.  You may also alternate between applying ice and applying heat as told by your health care provider. To apply heat, use the heat source that your health care provider recommends, such as a moist heat pack or a heating pad.  Place a towel between your skin and the heat source.  Leave the heat on for 20-30 minutes.  Remove the heat if your skin turns bright red. This is especially important if you are unable to feel pain, heat, or cold. You may have a greater risk of getting burned. Medicines  Take over-the-counter and prescription medicines only as told by your health care provider.  Do not drive or use heavy machinery while taking prescription pain medicine. Contact a health care provider if:  Your muscle pain gets worse and medicines do not help.  You have muscle pain that lasts longer than 3 days.  You have a rash or fever along with muscle pain.  You have muscle pain after a tick bite.  You have muscle pain while working  out, even though you are in good physical condition.  You have redness, soreness, or swelling along with muscle pain.  You have muscle pain after starting a new medicine or changing the dose of a medicine. Get help right away if:  You have trouble breathing.  You have trouble swallowing.  You have muscle pain along with a stiff neck, fever, and vomiting.  You have severe muscle weakness or cannot move part of your body. This information is not intended to replace advice given to you by your health  care provider. Make sure you discuss any questions you have with your health care provider. Document Released: 08/08/2006 Document Revised: 04/05/2016 Document Reviewed: 02/06/2016 Elsevier Interactive Patient Education  2017 [employer].

## 2016-09-05 NOTE — Progress Notes (Signed)
Subjective:    Patient ID: Tanya Leon, female    DOB: 17-Dec-1940, 75 y.o.   MRN: [id number]  HPI  Tanya Leon is a 75 year old female who presents today with neck pain that has been present intermittently for 5 to 6 months.  Pain is intermittent in nature and noted as a 3 to 4 but can go as high as 9 at times.  Pain is described as aching and muscular in nature. Today pain is rated as a 3 but has improved throughout the day. She denies radiation of pain, numbness, tingling, weakness, chest pain, palpitations, SOB, jaw pain, or arm pain or weakness. She reports that sitting at a computer for 3 hours at a time and notes that time on computer aggravates her symptoms. Alleviating factor is movement. Zanaflex provided moderate benefit. She is also trying to improve her posture and ergonomics when working at a computer.   Review of Systems  Constitutional: Negative for chills and fatigue.  Eyes: Negative for visual disturbance.  Respiratory: Negative for cough, shortness of breath and wheezing.   Cardiovascular: Negative for chest pain and palpitations.  Gastrointestinal: Negative for abdominal pain, diarrhea, nausea and vomiting.  Musculoskeletal: Positive for neck pain. Negative for arthralgias.  Neurological: Negative for dizziness, weakness, light-headedness, numbness and headaches.   Past Medical History:  Diagnosis Date  . Arthritis   . Cancer (HCC)    hx of skin cancer   . Colon polyps    adenomatous  . Complication of anesthesia    " pt stated they gave me too much - kept having to be reminded to breathe   . Constipation    opiod induced associated with back pain  . Depression    hx of   . Diverticulosis   . DIVERTICULOSIS, COLON 01/13/2009  . Heart murmur    benign, dagnosed around age 71,prophylactic antibiotic  . Low back pain   . Lumbar spondylolysis   . Osteopenia   . Pneumonia    hx of as a baby      Social History   Social History  . Marital status: Married      Spouse name: N/A  . Number of children: 2  . Years of education: N/A   Occupational History  . cpa/retired   .  Retired   Social History Main Topics  . Smoking status: Never Smoker  . Smokeless tobacco: Never Used  . Alcohol use 0.0 oz/week     Comment: 1 glass of wine daily   . Drug use: No  . Sexual activity: Yes   Other Topics Concern  . Not on file   Social History Narrative   Married 33 years in 2015. 2 kids. 4 grandkids.       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 Previously [employer].     Past Surgical History:  Procedure Laterality Date  . ABDOMINAL HERNIA REPAIR Right    incisional  . ABDOMINAL HYSTERECTOMY     with ovaries  . cataract left lens replacement     2016  . EYE MUSCLE SURGERY    . GLAUCOMA SURGERY Left 05/12/14  . incisional ab  05/28/11  . JOINT REPLACEMENT     right hip replacement   . LUMBAR FUSION     L4-L5  . PARTIAL KNEE ARTHROPLASTY Right   . PARTIAL KNEE ARTHROPLASTY Left   . perforated deverticular abscess/laporotomy and colostomy  depression after this  . stribismus eye surgery Right   . take down of colostomy    . tonsillectomly    . TOTAL KNEE REVISION Left 11/24/2015   Procedure: Conversion from left lateral unicompartmental knee arthroplasty to left total knee arthroplasty;  Surgeon: Mcarthur Rossetti, MD;  Location: [facility];  Service: Orthopedics;  Laterality: Left;    Family History  Problem Relation Age of Onset  . Alzheimer's disease Father   . Heart disease Father     mitral valve replaced. unknown reason  . Breast cancer Mother     61  . Prostate cancer Brother   . Colon polyps Mother   . Diabetes Mother   . Heart disease Mother 31    CHF, MI 64  . Heart attack Mother     x 2    Allergies  Allergen Reactions  . Shellfish Allergy Swelling    Current Outpatient Prescriptions on File Prior to Visit  Medication Sig Dispense Refill  . AMBULATORY NON  FORMULARY MEDICATION Evening Primerose Oil 1300 mg take 1 by mouth twice a day    . aspirin-acetaminophen-caffeine (EXCEDRIN EXTRA STRENGTH) 250-250-65 MG per tablet Take 2 tablets by mouth every 6 (six) hours as needed for headache. Reported on 11/03/2015    . azithromycin (AZASITE) 1 % ophthalmic solution Place 1 drop into both eyes every 30 (thirty) days. One week per month    . carboxymethylcellulose (REFRESH PLUS) 0.5 % SOLN Place 1 drop into both eyes daily as needed (Dry eyes.).     Marland Kitchen Cholecalciferol (VITAMIN D) 1000 UNITS capsule Take 2,000 Units by mouth every evening.     . Ciclopirox 1 % shampoo Apply 1 each topically every Friday.     . cycloSPORINE (RESTASIS) 0.05 % ophthalmic emulsion Place 1 drop into both eyes 2 (two) times daily.     . DULoxetine (CYMBALTA) 60 MG capsule TAKE ONE CAPSULE BY MOUTH EVERY DAY 90 capsule 3  . estradiol (VIVELLE-DOT) 0.05 MG/24HR Place 1 patch onto the skin 2 (two) times a week. Reported on 11/03/2015 on thursdays and Sundays    . fluticasone (FLONASE) 50 MCG/ACT nasal spray SHAKE LIQUID AND USE 2 SPRAYS IN EACH NOSTRIL DAILY 16 g 3  . Lutein 20 MG TABS Take 1 tablet by mouth every evening.     . Multiple Vitamins-Minerals (ICAPS AREDS 2) CAPS Take 1 capsule by mouth 2 (two) times daily.    . Multiple Vitamins-Minerals (MULTIVITAMIN PO) Take 1 tablet by mouth every morning.     . Olopatadine HCl (PATADAY) 0.2 % SOLN Place 1 drop into both eyes daily as needed (dry/itchy eyes.).     Marland Kitchen omega-3 acid ethyl esters (LOVAZA) 1 G capsule Take 1 g by mouth 2 (two) times daily.     . polyethylene glycol powder (GLYCOLAX/MIRALAX) powder Take 17 g by mouth every morning.     . pregabalin (LYRICA) 25 MG capsule Take 1 tablet in the morning and take 2 tablets in the evening 270 capsule 3  . tiZANidine (ZANAFLEX) 2 MG tablet TAKE 1 TABLET BY MOUTH AT BEDTIME 30 tablet 5  . zolpidem (AMBIEN) 5 MG tablet Take 1 tablet (5 mg total) by mouth at bedtime as needed for sleep.  30 tablet 5  . Probiotic Product (ALIGN) 4 MG CAPS Take 1 capsule by mouth every morning.      No current facility-administered medications on file prior to visit.     BP 128/78 (BP Location: Left Arm, Patient  Position: Sitting, Cuff Size: Normal)   Pulse 63   Temp 97.9 F (36.6 C) (Oral)   Wt 138 lb 3.2 oz (62.7 kg)   SpO2 96%   BMI 25.07 kg/m        Objective:   Physical Exam  Constitutional: She is oriented to person, place, and time. She appears well-developed and well-nourished.  HENT:  Right Ear: Tympanic membrane normal.  Left Ear: Tympanic membrane normal.  Nose: No rhinorrhea. Right sinus exhibits no maxillary sinus tenderness and no frontal sinus tenderness. Left sinus exhibits no maxillary sinus tenderness and no frontal sinus tenderness.  Mouth/Throat: Mucous membranes are normal. No oropharyngeal exudate or posterior oropharyngeal erythema.  Eyes: Pupils are equal, round, and reactive to light. No scleral icterus.  Neck: Normal range of motion. Neck supple. Muscular tenderness present. No spinous process tenderness present. No erythema present.  Negative Spurling  Pulmonary/Chest: Effort normal and breath sounds normal. She has no wheezes. She has no rales.  Lymphadenopathy:    She has no cervical adenopathy.  Neurological: She is alert and oriented to person, place, and time. She has normal strength. Coordination and gait normal.  Reflex Scores:      Bicep reflexes are 2+ on the right side and 2+ on the left side.      Brachioradialis reflexes are 2+ on the right side and 2+ on the left side. II-Visual fields grossly intact. III/IV/VI-Extraocular movements intact. Pupils reactive bilaterally. V/VII-Smile symmetric, equal eyebrow raise, facial sensation intact VIII- Hearing grossly intact XI-bilateral shoulder shrug Motor: 5/5 bilaterally with normal tone and bulk Cerebellar: Normal finger-to-nose and normal heel-to-shin test.  Romberg negative Ambulates  with a coordinated gait   Skin: Skin is warm and dry. No rash noted.       Assessment & Plan:  1. Neck pain on left side Suspect that pain is muscular in nature. Pain is not present consistently and improves throughout the day with movement. No concerning findings on exam. Advised Zanaflex as directed by provider and a referral to PT for evaluation has been placed. She has seen PT before for other issues and is interested in evaluation. We discussed taking breaks when working on the computer and paying close attention to posture. Ice/heat as needed for discomfort. Follow up with Dr. Yong Channel if symptoms do not improve with PT and Zanaflex as imaging may be considered. Return precautions advised of fever, worsening pain, radiating pain, numbness, tingling, or weakness.  - Ambulatory referral to Physical Therapy  Delano Metz, FNP-C

## 2016-09-05 NOTE — Progress Notes (Signed)
Pre visit review using our clinic review tool, if applicable. No additional management support is needed unless otherwise documented below in the visit note. 

## 2016-09-09 DIAGNOSIS — M542 Cervicalgia: Secondary | ICD-10-CM | POA: Diagnosis not present

## 2016-09-11 DIAGNOSIS — M542 Cervicalgia: Secondary | ICD-10-CM | POA: Diagnosis not present

## 2016-10-28 ENCOUNTER — Telehealth: Payer: Self-pay

## 2016-10-28 ENCOUNTER — Telehealth: Payer: Self-pay | Admitting: Family Medicine

## 2016-10-28 MED ORDER — FLUTICASONE PROPIONATE 50 MCG/ACT NA SUSP: NASAL | 3 refills | Status: DC

## 2016-10-28 MED ORDER — ZOLPIDEM TARTRATE 5 MG PO TABS: 5.0000 mg | ORAL_TABLET | Freq: Every evening | ORAL | 5 refills | Status: DC | PRN

## 2016-10-28 MED ORDER — DULOXETINE HCL 60 MG PO CPEP: 60.0000 mg | ORAL_CAPSULE | Freq: Every day | ORAL | 3 refills | Status: DC

## 2016-10-28 MED ORDER — TIZANIDINE HCL 2 MG PO TABS: 2.0000 mg | ORAL_TABLET | Freq: Every day | ORAL | 5 refills | Status: DC

## 2016-10-28 MED ORDER — PREGABALIN 25 MG PO CAPS: ORAL_CAPSULE | ORAL | 3 refills | Status: DC

## 2016-10-28 NOTE — Telephone Encounter (Signed)
Prescriptions sent to pharmacy as requested 

## 2016-10-28 NOTE — Telephone Encounter (Signed)
Pt request refill  DULoxetine (CYMBALTA) 60 MG capsule pregabalin (LYRICA) 25 MG capsule tiZANidine (ZANAFLEX) 2 MG tablet zolpidem (AMBIEN) 5 MG tablet fluticasone (FLONASE) 50 MCG/ACT nasal spray  90 day  Pt has new insurance company and new refills sent to new pharmacy.  Please change preferred pharmacy  [facility]

## 2016-11-04 ENCOUNTER — Ambulatory Visit (INDEPENDENT_AMBULATORY_CARE_PROVIDER_SITE_OTHER): Payer: Medicare Other | Admitting: Orthopaedic Surgery

## 2016-11-11 ENCOUNTER — Ambulatory Visit (INDEPENDENT_AMBULATORY_CARE_PROVIDER_SITE_OTHER): Payer: Medicare Other | Admitting: Podiatry

## 2016-11-11 ENCOUNTER — Encounter: Payer: Self-pay | Admitting: Podiatry

## 2016-11-11 VITALS — BP 164/94 | HR 62 | Resp 14

## 2016-11-11 DIAGNOSIS — B351 Tinea unguium: Secondary | ICD-10-CM

## 2016-11-11 DIAGNOSIS — L603 Nail dystrophy: Secondary | ICD-10-CM

## 2016-11-13 ENCOUNTER — Telehealth: Payer: Self-pay | Admitting: Family Medicine

## 2016-11-13 NOTE — Telephone Encounter (Signed)
Pt states her insurance states they need a prior auth for her zolpidem (AMBIEN) 5 MG tablet  I do not see that we have received.

## 2016-11-13 NOTE — Telephone Encounter (Signed)
Pt states her fluticasone (FLONASE) 50 MCG/ACT nasal spray  was not a 90 day supply.  Would like you to resend a 90 day to [facility] [address]  She did not pick up the one bottle  Pt is out.Tanya Leon

## 2016-11-13 NOTE — Progress Notes (Signed)
Subjective: 76 year old female presents the office today for continued care of bilateral hallux onychodystrophy, onychomycosis. At this time she is still using the formula 3 and vinegar soaks. She states the nails are about the same. Denies any pain, swelling, redness, drainage. She has no other complaints today.  Denies any systemic complaints such as fevers, chills, nausea, vomiting. No acute changes since last appointment, and no other complaints at this time.   Objective: AAO x3, NAD; frustrated with her toenails DP/PT pulses palpable bilaterally, CRT less than 3 seconds Bilateral hallux toenails are dystrophic, discolored. There is minimal nail growth at this time on the left hallux toenail. There does appear to be more growth on the right side however only about 1/2 of the nail is present in length. There is hypertrophy of the right hallux toenail. The remaining toenails are also dystrophic, discolored with yellow discoloration. There is no swelling erythema, drainage or any signs of infection. No pain with calf compression, swelling, warmth, erythema  Assessment: Bilateral hallux onychodystrophy, onychomycosis  Plan: -All treatment options discussed with the patient including all alternatives, risks, complications.  -At this time she has completedmultiple conservative treatments. As a final effort we will attempt to laser the toenails. Discussed this is not a guarantee of resolution of symptoms. She is willing to try. We will get her scheduled at her convince to start. Continue formula 3   Celesta Gentile, DPM

## 2016-11-14 ENCOUNTER — Other Ambulatory Visit: Payer: Self-pay

## 2016-11-14 DIAGNOSIS — H401132 Primary open-angle glaucoma, bilateral, moderate stage: Secondary | ICD-10-CM | POA: Diagnosis not present

## 2016-11-14 DIAGNOSIS — H01003 Unspecified blepharitis right eye, unspecified eyelid: Secondary | ICD-10-CM | POA: Diagnosis not present

## 2016-11-14 DIAGNOSIS — H53452 Other localized visual field defect, left eye: Secondary | ICD-10-CM | POA: Diagnosis not present

## 2016-11-14 DIAGNOSIS — H15843 Scleral ectasia, bilateral: Secondary | ICD-10-CM | POA: Diagnosis not present

## 2016-11-14 MED ORDER — FLUTICASONE PROPIONATE 50 MCG/ACT NA SUSP: NASAL | 3 refills | Status: DC

## 2016-11-14 NOTE — Telephone Encounter (Signed)
Called pharmacy and 3 month prescription sent to pharmacy

## 2016-11-15 NOTE — Telephone Encounter (Signed)
PA submitted & is pending. Key: [id number]

## 2016-11-15 NOTE — Telephone Encounter (Signed)
PA approved, form faxed back to pharmacy. 

## 2016-11-18 DIAGNOSIS — H401132 Primary open-angle glaucoma, bilateral, moderate stage: Secondary | ICD-10-CM | POA: Diagnosis not present

## 2016-11-18 DIAGNOSIS — H35363 Drusen (degenerative) of macula, bilateral: Secondary | ICD-10-CM | POA: Diagnosis not present

## 2016-11-18 DIAGNOSIS — H35371 Puckering of macula, right eye: Secondary | ICD-10-CM | POA: Diagnosis not present

## 2016-11-18 DIAGNOSIS — H4423 Degenerative myopia, bilateral: Secondary | ICD-10-CM | POA: Diagnosis not present

## 2016-11-18 DIAGNOSIS — H35413 Lattice degeneration of retina, bilateral: Secondary | ICD-10-CM | POA: Diagnosis not present

## 2016-11-18 DIAGNOSIS — H353131 Nonexudative age-related macular degeneration, bilateral, early dry stage: Secondary | ICD-10-CM | POA: Diagnosis not present

## 2016-11-19 ENCOUNTER — Ambulatory Visit: Payer: Medicare Other

## 2016-11-19 DIAGNOSIS — B351 Tinea unguium: Secondary | ICD-10-CM

## 2016-11-20 ENCOUNTER — Ambulatory Visit (INDEPENDENT_AMBULATORY_CARE_PROVIDER_SITE_OTHER): Payer: Medicare Other

## 2016-11-20 ENCOUNTER — Encounter (INDEPENDENT_AMBULATORY_CARE_PROVIDER_SITE_OTHER): Payer: Self-pay | Admitting: Orthopaedic Surgery

## 2016-11-20 ENCOUNTER — Ambulatory Visit (INDEPENDENT_AMBULATORY_CARE_PROVIDER_SITE_OTHER): Payer: Medicare Other | Admitting: Orthopaedic Surgery

## 2016-11-20 DIAGNOSIS — G8929 Other chronic pain: Secondary | ICD-10-CM | POA: Diagnosis not present

## 2016-11-20 DIAGNOSIS — M25561 Pain in right knee: Secondary | ICD-10-CM

## 2016-11-20 DIAGNOSIS — M25562 Pain in left knee: Secondary | ICD-10-CM

## 2016-11-20 NOTE — Progress Notes (Signed)
The patient is 1 year out from conversion of a left knee lateral apartment knee arthroplasty to total knee replacement. 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 We performed a total knee replacement last year and she said that is causing no problems at all she is doing well. She has occasional aches and pains. She has had a previous right knee lateral unicompartmental knee replacement and that knee is done well. She does point to the lateral aspect of her legs source of pain as well as her thigh. She has increased her walking activities recently.  On examination of both knees neither knee has an effusion. Both knees feel ligamentously stable. Both knees have good range of motion. X-rays of both knee show well-seated implant with no, getting features. There is no evidence of loosening or effusion. On exam of her right knee she does have significant pain over the iliotibial band.  I gave her prescription for outpatient physical therapy where she's been before. 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 She'll otherwise follow up as needed. I talked her about things need to bring her back for her knees or anything else that she is having issues.

## 2016-11-21 NOTE — Progress Notes (Signed)
Pt presents with mycotic infection of nails 1-5 bilateral  All other systems are negative  Laser therapy administered to affected nails and tolerated well. All safety precautions were in place

## 2016-12-03 ENCOUNTER — Ambulatory Visit (INDEPENDENT_AMBULATORY_CARE_PROVIDER_SITE_OTHER): Payer: Medicare Other | Admitting: Family Medicine

## 2016-12-03 ENCOUNTER — Encounter: Payer: Self-pay | Admitting: Family Medicine

## 2016-12-03 DIAGNOSIS — F325 Major depressive disorder, single episode, in full remission: Secondary | ICD-10-CM | POA: Diagnosis not present

## 2016-12-03 DIAGNOSIS — D696 Thrombocytopenia, unspecified: Secondary | ICD-10-CM | POA: Diagnosis not present

## 2016-12-03 DIAGNOSIS — G4700 Insomnia, unspecified: Secondary | ICD-10-CM

## 2016-12-03 DIAGNOSIS — M48061 Spinal stenosis, lumbar region without neurogenic claudication: Secondary | ICD-10-CM

## 2016-12-03 MED ORDER — ZOLPIDEM TARTRATE 5 MG PO TABS: 5.0000 mg | ORAL_TABLET | Freq: Every evening | ORAL | 5 refills | Status: DC | PRN

## 2016-12-03 NOTE — Assessment & Plan Note (Signed)
S: wanted to reduce cymbalta as well at last visit. Unfortunately she feels like she is in a more stressful situation and would be unable to do this. She has a brother with cancer who does not treat her well when she tries to help- she considers relationship "toxic" but is concerned if she does engage it will be bad for her depression, if she does not engage- worried it will be more stressful in long run if he does not do well A/P: we agreed to keep cymbalta at 60mg . Gave handout for her to see Dr. Glennon Hamilton- she will consider to get 3rd party opinion

## 2016-12-03 NOTE — Progress Notes (Signed)
Pre visit review using our clinic review tool, if applicable. No additional management support is needed unless otherwise documented below in the visit note. 

## 2016-12-03 NOTE — Progress Notes (Addendum)
Subjective:  Tanya Leon is a 76 y.o. year old very pleasant female patient who presents for/with See problem oriented charting ROS- admits to some stress but denies anhedonia. No SI. No chest pain or shortness of breath some right buttocks and lateral thigh pain along IT band.    Past Medical History-  Patient Active Problem List   Diagnosis Date Noted  . Basal cell carcinoma 07/07/2014    Priority: Medium  . Depression 06/01/2014    Priority: Medium  . SPINAL STENOSIS, LUMBAR 01/25/2008    Priority: Medium  . INSOMNIA, CHRONIC 07/22/2007    Priority: Medium  . Thrombocytopenia ([place]) 05/03/2015    Priority: Low  . 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 Glaucoma 06/01/2014    Priority: Low  . 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 COLONIC POLYPS 01/13/2009    Priority: Low  . 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 ALLERGIC RHINITIS 05/21/2007    Priority: Low  . Osteopenia 02/26/2007    Priority: Low  . 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 AMBULATORY NON FORMULARY MEDICATION Evening Primerose Oil 1300 mg take 1 by mouth twice a day    . aspirin-acetaminophen-caffeine (EXCEDRIN EXTRA STRENGTH) 250-250-65 MG per tablet Take 2 tablets by mouth every 6 (six) hours as needed for headache. Reported on 11/03/2015    . azithromycin (AZASITE) 1 % ophthalmic solution Place 1 drop into both eyes every 30 (thirty) days. One week per month    . carboxymethylcellulose (REFRESH PLUS) 0.5 % SOLN Place 1 drop into both eyes daily as needed (Dry eyes.).     Marland Kitchen Cholecalciferol (VITAMIN D) 1000 UNITS capsule Take 2,000 Units by mouth every evening.     . Ciclopirox 1 % shampoo Apply 1 each topically every Friday.     . cycloSPORINE (RESTASIS)  0.05 % ophthalmic emulsion Place 1 drop into both eyes 2 (two) times daily.     . DULoxetine (CYMBALTA) 60 MG capsule Take 1 capsule (60 mg total) by mouth daily. 90 capsule 3  . estradiol (VIVELLE-DOT) 0.05 MG/24HR Place 1 patch onto the skin 2 (two) times a week. Reported on 11/03/2015 on thursdays and Sundays    . fluticasone (FLONASE) 50 MCG/ACT nasal spray SHAKE LIQUID AND USE 2 SPRAYS IN EACH NOSTRIL DAILY 48 g 3  . Lutein 20 MG TABS Take 1 tablet by mouth every evening.     . Multiple Vitamins-Minerals (ICAPS AREDS 2) CAPS Take 1 capsule by mouth 2 (two) times daily.    . Multiple Vitamins-Minerals (MULTIVITAMIN PO) Take 1 tablet by mouth every morning.     . Olopatadine HCl (PATADAY) 0.2 % SOLN Place 1 drop into both eyes daily as needed (dry/itchy eyes.).     Marland Kitchen omega-3 acid ethyl esters (LOVAZA) 1 G capsule Take 1 g by mouth 2 (two) times daily.     . polyethylene glycol powder (GLYCOLAX/MIRALAX) powder Take 17 g by mouth every morning.     . pregabalin (LYRICA) 25 MG capsule Take 1 tablet in the morning and take 2 tablets in the evening 270 capsule 3  . Probiotic Product (ALIGN) 4 MG CAPS Take 1 capsule by mouth every morning.     Marland Kitchen tiZANidine (ZANAFLEX) 2 MG tablet Take  1 tablet (2 mg total) by mouth at bedtime. 30 tablet 5  . zolpidem (AMBIEN) 5 MG tablet Take 1 tablet (5 mg total) by mouth at bedtime as needed for sleep. 30 tablet 5   No current facility-administered medications for this visit.     Objective: BP 132/88 (BP Location: Left Arm, Patient Position: Sitting, Cuff Size: Normal)   Pulse 64   Temp 98 F (36.7 C) (Oral)   Wt 138 lb 6.4 oz (62.8 kg)   SpO2 97%   BMI 25.11 kg/m  Gen: NAD, resting comfortably CV: RRR no murmurs rubs or gallops Lungs: CTAB no crackles, wheeze, rhonchi Ext: no edema Skin: warm, dry Psych: tearful at times when discussing brother  Assessment/Plan:  SPINAL STENOSIS, LUMBAR S: last visit in September we reduced to 25 in Am [place] and 50  in PM. Wanted to consider 25 in Am and PM at this visit. She feels like overall she has had more aches in pains and has seen ortho recently- they think she has IT band on right side acting up  A/P: could be overalap in pain in buttocks she has with lumbar spinal stenosis. We opted to keep her current dose of 25 Am, 50 PM lyrica and consider 25,25 in future if feeling better  Depression S: wanted to reduce cymbalta as well at last visit. Unfortunately she feels like she is in a more stressful situation and would be unable to do this. She has a brother with cancer who does not treat her well when she tries to help- she considers relationship "toxic" but is concerned if she does engage it will be bad for her depression, if she does not engage- worried it will be more stressful in long run if he does not do well A/P: we agreed to keep cymbalta at 60mg . Gave handout for her to see Dr. Glennon Leon- she will consider to get 3rd party opinion  INSOMNIA, CHRONIC S: doing well on ambien. No abnormal dreams, driving issues.  A/P: doing well- refilled medication  Thrombocytopenia Mild, intermittent. Noted on last labs- rest of cell lines ok- repeat in 6 months Lab Results  Component Value Date   WBC 4.0 06/04/2016   HGB 13.8 06/04/2016   HCT 40.9 06/04/2016   MCV 91.2 06/04/2016   PLT 145.0 (L) 06/04/2016      Return in about 6 months (around 06/05/2017) for annual wellness visit. See Dr. Yong Leon same day as visit with Tanya Leon.  Meds ordered this encounter  Medications  . zolpidem (AMBIEN) 5 MG tablet    Sig: Take 1 tablet (5 mg total) by mouth at bedtime as needed for sleep.    Dispense:  30 tablet    Refill:  5   Return precautions advised.  Tanya Reddish, MD

## 2016-12-03 NOTE — Assessment & Plan Note (Signed)
S: doing well on [place]. No abnormal dreams, driving issues.  A/P: doing well- refilled medication

## 2016-12-03 NOTE — Assessment & Plan Note (Signed)
Mild, intermittent. Noted on last labs- rest of cell lines ok- repeat in 6 months Lab Results  Component Value Date   WBC 4.0 06/04/2016   HGB 13.8 06/04/2016   HCT 40.9 06/04/2016   MCV 91.2 06/04/2016   PLT 145.0 (L) 06/04/2016

## 2016-12-03 NOTE — Patient Instructions (Addendum)
No changes today  Consider calling Dr. Glennon Hamilton. Certainly keep me in the loop if you feel like your depression goes from controlled to uncontrolled and absolutely call us if any thoughts of hurting yourself.   6 month follow up. We are doing our annual wellness visits with our nurse [name] now. You could set this up on the same day as follow up with me.

## 2016-12-03 NOTE — Assessment & Plan Note (Signed)
S: last visit in September we reduced to 25 in Am [place] and 50 in PM. Wanted to consider 25 in Am and PM at this visit. She feels like overall she has had more aches in pains and has seen ortho recently- they think she has IT band on right side acting up  A/P: could be overalap in pain in buttocks she has with lumbar spinal stenosis. We opted to keep her current dose of 25 Am, 50 PM lyrica and consider 25,25 in future if feeling better

## 2016-12-09 DIAGNOSIS — M7631 Iliotibial band syndrome, right leg: Secondary | ICD-10-CM | POA: Diagnosis not present

## 2016-12-12 DIAGNOSIS — M7631 Iliotibial band syndrome, right leg: Secondary | ICD-10-CM | POA: Diagnosis not present

## 2016-12-16 DIAGNOSIS — M7631 Iliotibial band syndrome, right leg: Secondary | ICD-10-CM | POA: Diagnosis not present

## 2016-12-18 DIAGNOSIS — M7631 Iliotibial band syndrome, right leg: Secondary | ICD-10-CM | POA: Diagnosis not present

## 2016-12-19 ENCOUNTER — Ambulatory Visit (INDEPENDENT_AMBULATORY_CARE_PROVIDER_SITE_OTHER): Payer: Self-pay | Admitting: Podiatry

## 2016-12-19 DIAGNOSIS — B351 Tinea unguium: Secondary | ICD-10-CM

## 2016-12-19 DIAGNOSIS — L603 Nail dystrophy: Secondary | ICD-10-CM

## 2016-12-19 MED ORDER — NONFORMULARY OR COMPOUNDED ITEM: 1.0000 g | Freq: Every day | 11 refills | Status: DC

## 2016-12-19 NOTE — Progress Notes (Signed)
Pt presents with mycotic infection of nails 1-5 bilateral  All other systems are negative  Laser therapy administered to affected nails and tolerated well. All safety precautions were in place. Re-appointed in 4 weeks for 3rd treatment 

## 2016-12-25 DIAGNOSIS — M4125 Other idiopathic scoliosis, thoracolumbar region: Secondary | ICD-10-CM | POA: Diagnosis not present

## 2016-12-25 DIAGNOSIS — Z96653 Presence of artificial knee joint, bilateral: Secondary | ICD-10-CM | POA: Diagnosis not present

## 2016-12-25 DIAGNOSIS — M25462 Effusion, left knee: Secondary | ICD-10-CM | POA: Diagnosis not present

## 2016-12-25 DIAGNOSIS — Z471 Aftercare following joint replacement surgery: Secondary | ICD-10-CM | POA: Diagnosis not present

## 2016-12-25 DIAGNOSIS — Z96641 Presence of right artificial hip joint: Secondary | ICD-10-CM | POA: Diagnosis not present

## 2016-12-25 DIAGNOSIS — M2341 Loose body in knee, right knee: Secondary | ICD-10-CM | POA: Diagnosis not present

## 2016-12-26 DIAGNOSIS — M7631 Iliotibial band syndrome, right leg: Secondary | ICD-10-CM | POA: Diagnosis not present

## 2017-01-01 DIAGNOSIS — M7631 Iliotibial band syndrome, right leg: Secondary | ICD-10-CM | POA: Diagnosis not present

## 2017-01-21 ENCOUNTER — Ambulatory Visit: Payer: Medicare Other

## 2017-01-21 DIAGNOSIS — B351 Tinea unguium: Secondary | ICD-10-CM

## 2017-01-21 DIAGNOSIS — L821 Other seborrheic keratosis: Secondary | ICD-10-CM | POA: Diagnosis not present

## 2017-01-21 DIAGNOSIS — L570 Actinic keratosis: Secondary | ICD-10-CM | POA: Diagnosis not present

## 2017-01-21 DIAGNOSIS — Z85828 Personal history of other malignant neoplasm of skin: Secondary | ICD-10-CM | POA: Diagnosis not present

## 2017-01-21 DIAGNOSIS — L820 Inflamed seborrheic keratosis: Secondary | ICD-10-CM | POA: Diagnosis not present

## 2017-01-21 DIAGNOSIS — L719 Rosacea, unspecified: Secondary | ICD-10-CM | POA: Diagnosis not present

## 2017-01-21 DIAGNOSIS — D179 Benign lipomatous neoplasm, unspecified: Secondary | ICD-10-CM | POA: Diagnosis not present

## 2017-01-23 DIAGNOSIS — M7631 Iliotibial band syndrome, right leg: Secondary | ICD-10-CM | POA: Diagnosis not present

## 2017-01-24 NOTE — Progress Notes (Signed)
Pt presents with mycotic infection of nails 1-5 bilateral  All other systems are negative  Laser therapy administered to affected nails and tolerated well. All safety precautions were in place. Re-appointed in 4 weeks for 4th treatment 

## 2017-02-10 DIAGNOSIS — Z1231 Encounter for screening mammogram for malignant neoplasm of breast: Secondary | ICD-10-CM | POA: Diagnosis not present

## 2017-02-10 DIAGNOSIS — Z803 Family history of malignant neoplasm of breast: Secondary | ICD-10-CM | POA: Diagnosis not present

## 2017-02-10 LAB — HM MAMMOGRAPHY

## 2017-02-11 ENCOUNTER — Encounter: Payer: Self-pay | Admitting: Family Medicine

## 2017-02-17 ENCOUNTER — Ambulatory Visit (INDEPENDENT_AMBULATORY_CARE_PROVIDER_SITE_OTHER): Payer: Self-pay

## 2017-02-17 DIAGNOSIS — B351 Tinea unguium: Secondary | ICD-10-CM

## 2017-02-18 ENCOUNTER — Other Ambulatory Visit: Payer: Self-pay | Admitting: Family Medicine

## 2017-02-18 ENCOUNTER — Telehealth: Payer: Self-pay | Admitting: Family Medicine

## 2017-02-18 NOTE — Progress Notes (Signed)
Pt presents with mycotic infection of nails 1-5 bilateral   All other systems are negative  Laser therapy administered to affected nails and tolerated well. All safety precautions were in place. Re-appointed in 4 weeks for 5th treatment 

## 2017-02-18 NOTE — Telephone Encounter (Signed)
Pt is turning up tv loud and having hearing issues. Pt would like to see dr Thornell Mule for audiology evaluation. Pt has medicare

## 2017-02-20 ENCOUNTER — Other Ambulatory Visit: Payer: Self-pay

## 2017-02-20 DIAGNOSIS — H9190 Unspecified hearing loss, unspecified ear: Secondary | ICD-10-CM

## 2017-02-20 NOTE — Telephone Encounter (Signed)
Referral placed as requested.

## 2017-02-25 DIAGNOSIS — M8580 Other specified disorders of bone density and structure, unspecified site: Secondary | ICD-10-CM | POA: Diagnosis not present

## 2017-02-25 DIAGNOSIS — Z6825 Body mass index (BMI) 25.0-25.9, adult: Secondary | ICD-10-CM | POA: Diagnosis not present

## 2017-02-25 DIAGNOSIS — N819 Female genital prolapse, unspecified: Secondary | ICD-10-CM | POA: Diagnosis not present

## 2017-02-26 ENCOUNTER — Encounter: Payer: Self-pay | Admitting: Family Medicine

## 2017-03-11 ENCOUNTER — Encounter: Payer: Self-pay | Admitting: Internal Medicine

## 2017-03-11 DIAGNOSIS — H01003 Unspecified blepharitis right eye, unspecified eyelid: Secondary | ICD-10-CM | POA: Diagnosis not present

## 2017-03-17 ENCOUNTER — Ambulatory Visit (INDEPENDENT_AMBULATORY_CARE_PROVIDER_SITE_OTHER): Payer: Medicare Other | Admitting: Family Medicine

## 2017-03-17 ENCOUNTER — Encounter: Payer: Self-pay | Admitting: Family Medicine

## 2017-03-17 VITALS — BP 128/82 | HR 66 | Temp 97.6°F | Ht 62.25 in | Wt 139.6 lb

## 2017-03-17 DIAGNOSIS — I493 Ventricular premature depolarization: Secondary | ICD-10-CM | POA: Diagnosis not present

## 2017-03-17 DIAGNOSIS — I499 Cardiac arrhythmia, unspecified: Secondary | ICD-10-CM | POA: Diagnosis not present

## 2017-03-17 DIAGNOSIS — R002 Palpitations: Secondary | ICD-10-CM

## 2017-03-17 NOTE — Patient Instructions (Signed)
Please stop by lab before you go  We will call you within a week about your referral to cardiology. If you do not hear within 2 weeks, give [place] a call.   Let us know if you have new symptoms such as chest pain or shortness of breath, increasing dizziness, increasing frequency of palpitations

## 2017-03-17 NOTE — Progress Notes (Signed)
Subjective:  LECHELLE WRIGLEY is a 76 y.o. year old very pleasant female patient who presents for/with See problem oriented charting ROS- no chest pain, shortness of breath. Mildest of dizziness. Denies fatigue   Past Medical History-  Patient Active Problem List   Diagnosis Date Noted  . Basal cell carcinoma 07/07/2014    Priority: Medium  . Depression 06/01/2014    Priority: Medium  . SPINAL STENOSIS, LUMBAR 01/25/2008    Priority: Medium  . INSOMNIA, CHRONIC 07/22/2007    Priority: Medium  . Thrombocytopenia ([place]) 05/03/2015    Priority: Low  . 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 Glaucoma 06/01/2014    Priority: Low  . 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 COLONIC POLYPS 01/13/2009    Priority: Low  . 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 ALLERGIC RHINITIS 05/21/2007    Priority: Low  . Osteopenia 02/26/2007    Priority: Low  . 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 AMBULATORY NON FORMULARY MEDICATION Evening Primerose Oil 1300 mg take 1 by mouth twice a day    . aspirin-acetaminophen-caffeine (EXCEDRIN EXTRA STRENGTH) 250-250-65 MG per tablet Take 2 tablets by mouth every 6 (six) hours as needed for headache. Reported on 11/03/2015    . azithromycin (AZASITE) 1 % ophthalmic solution Place 1 drop into both eyes every 30 (thirty) days. One week per month    . carboxymethylcellulose (REFRESH PLUS) 0.5 % SOLN Place 1 drop into both eyes daily as needed (Dry eyes.).     Marland Kitchen Cholecalciferol (VITAMIN D) 1000 UNITS capsule Take 2,000 Units by mouth every evening.     . Ciclopirox 1 % shampoo Apply 1 each topically every Friday.     . cycloSPORINE (RESTASIS) 0.05 % ophthalmic emulsion Place 1 drop into both eyes 2 (two) times daily.      . DULoxetine (CYMBALTA) 60 MG capsule Take 1 capsule (60 mg total) by mouth daily. 90 capsule 3  . estradiol (VIVELLE-DOT) 0.05 MG/24HR Place 1 patch onto the skin 2 (two) times a week. Reported on 11/03/2015 on thursdays and Sundays    . fluticasone (FLONASE) 50 MCG/ACT nasal spray SHAKE LIQUID AND USE 2 SPRAYS IN EACH NOSTRIL DAILY 48 g 3  . Lutein 20 MG TABS Take 1 tablet by mouth every evening.     . Multiple Vitamins-Minerals (ICAPS AREDS 2) CAPS Take 1 capsule by mouth 2 (two) times daily.    . Multiple Vitamins-Minerals (MULTIVITAMIN PO) Take 1 tablet by mouth every morning.     . NONFORMULARY OR COMPOUNDED ITEM Apply 1-2 g topically daily. [employer] Nail lacquer: Fluconazole 2%, Terbinafine 1%, DMSO 120 each 11  . Olopatadine HCl (PATADAY) 0.2 % SOLN Place 1 drop into both eyes daily as needed (dry/itchy eyes.).     Marland Kitchen omega-3 acid ethyl esters (LOVAZA) 1 G capsule Take 1 g by mouth 2 (two) times daily.     . polyethylene glycol powder (GLYCOLAX/MIRALAX) powder Take 17 g by mouth every morning.     . pregabalin (LYRICA) 25 MG capsule Take 1 tablet in the morning and take 2 tablets in the evening 270 capsule 3  . Probiotic Product (ALIGN) 4 MG CAPS Take 1 capsule by mouth every morning.     Marland Kitchen  tiZANidine (ZANAFLEX) 2 MG tablet TAKE 1 TABLET (2 MG TOTAL) BY MOUTH AT BEDTIME. 30 tablet 5  . zolpidem (AMBIEN) 5 MG tablet Take 1 tablet (5 mg total) by mouth at bedtime as needed for sleep. 30 tablet 5   No current facility-administered medications for this visit.     Objective: BP 128/82 (BP Location: Left Arm, Patient Position: Sitting, Cuff Size: Normal)   Pulse 66   Temp 97.6 F (36.4 C) (Oral)   Ht 5' 2.25" (1.581 m)   Wt 139 lb 9.6 oz (63.3 kg)   SpO2 97%   BMI 25.33 kg/m  Gen: NAD, resting comfortably No obvious thyromegaly CV: RRR no murmurs rubs or gallops. No ectopic beat detected on my exam.  Lungs: CTAB no crackles, wheeze, rhonchi Abdomen:  soft/nontender/nondistended/normal bowel sounds. No rebound or guarding.  Ext: no edema Skin: warm, dry Neuro: grossly normal, moves all extremities  EKG: sinus rhythm with 1 PVC noted, left axis, normal intervals, possible left atrial hypertrophy, no significant st or t wave changes- some movement on exam. EKG similar to exam 05/28/11 with no significant changes other than new PVC.   Assessment/Plan:  Irregular heart rate - Plan: EKG 12-Lead, Ambulatory referral to Cardiology, CBC, TSH, Comprehensive metabolic panel  Palpitations - Plan: Ambulatory referral to Cardiology, CBC, TSH, Comprehensive metabolic panel S: symptoms started about 2 days ago- feels like skipped beat of her heart on a pretty constant basis. Has noted up to 6x a minute. Checks her pulse and feels like a skip. She reflects back and thinks sympoms may have been going on for longer. A lot of stress recently with brother, loss of sister in law. Over last 2 days, states feels slightly uneasy since this time- slightly lightheaded.   Surgery scheduled July 11th- tension free vaginal tape with Dr. Philis Pique per her report A/P: PVC detected on EKG and may be cause. She has minimal cardiac risk factors outside of age. She is not on BP meds and BP controlled. Last LDL was 80 with HDL of 90. That being said- with her upcoming surgery we discussed option of cardiac evaluation and she agrees. I will get basic labs. She drinks 2 cups of coffee a day and I encouraged her to cut down on those.   She asks to see Dr. Gwenlyn Found if possible within next 2 weeks- husband sees hiim. If not available, willing to see other cardiologist/PA/NP.   Orders Placed This Encounter  Procedures  . CBC    [place]  . TSH    [place]  . Comprehensive metabolic panel    [place]  . Ambulatory referral to Cardiology    Referral Priority:   Routine    Referral Type:   Consultation    Referral Reason:   Specialty Services Required    Requested Specialty:    Cardiology    Number of Visits Requested:   1  . EKG 12-Lead    Order Specific Question:   Where should this test be performed    Answer:   Other    Return precautions advised.  Garret Reddish, MD

## 2017-03-18 ENCOUNTER — Other Ambulatory Visit: Payer: Self-pay | Admitting: Family Medicine

## 2017-03-18 LAB — COMPREHENSIVE METABOLIC PANEL
ALT: 12 U/L (ref 0–35)
AST: 13 U/L (ref 0–37)
Albumin: 4.4 g/dL (ref 3.5–5.2)
Alkaline Phosphatase: 66 U/L (ref 39–117)
BUN: 16 mg/dL (ref 6–23)
CALCIUM: 9.5 mg/dL (ref 8.4–10.5)
CO2: 30 mEq/L (ref 19–32)
CREATININE: 0.78 mg/dL (ref 0.40–1.20)
Chloride: 102 mEq/L (ref 96–112)
GFR: 76.06 mL/min (ref >60.00)
Glucose, Bld: 113 mg/dL — ABNORMAL HIGH (ref 70–99)
POTASSIUM: 4.2 meq/L (ref 3.5–5.1)
Sodium: 139 mEq/L (ref 135–145)
TOTAL PROTEIN: 6.5 g/dL (ref 6.0–8.3)
Total Bilirubin: 0.6 mg/dL (ref 0.2–1.2)

## 2017-03-18 LAB — CBC
HCT: 42.9 % (ref 36.0–46.0)
Hemoglobin: 14.5 g/dL (ref 12.0–15.0)
MCHC: 33.7 g/dL (ref 30.0–36.0)
MCV: 91.9 fl (ref 78.0–100.0)
Platelets: 141 10*3/uL — ABNORMAL LOW (ref 150.0–400.0)
RBC: 4.67 Mil/uL (ref 3.87–5.11)
RDW: 13.8 % (ref 11.5–15.5)
WBC: 5.4 10*3/uL (ref 4.0–10.5)

## 2017-03-18 LAB — TSH: TSH: 2.23 u[IU]/mL (ref 0.35–4.50)

## 2017-03-18 MED ORDER — TIZANIDINE HCL 2 MG PO TABS: 2.0000 mg | ORAL_TABLET | Freq: Every day | ORAL | 1 refills | Status: DC

## 2017-03-18 NOTE — Telephone Encounter (Signed)
Received a fax from the pharmacy.  Pt requesting a 90 day supply.

## 2017-03-24 ENCOUNTER — Ambulatory Visit: Payer: Medicare Other | Admitting: Sports Medicine

## 2017-03-24 DIAGNOSIS — B351 Tinea unguium: Secondary | ICD-10-CM

## 2017-03-26 ENCOUNTER — Ambulatory Visit (INDEPENDENT_AMBULATORY_CARE_PROVIDER_SITE_OTHER): Payer: Medicare Other | Admitting: Cardiovascular Disease

## 2017-03-26 ENCOUNTER — Ambulatory Visit: Payer: Medicare Other

## 2017-03-26 ENCOUNTER — Encounter: Payer: Self-pay | Admitting: Cardiovascular Disease

## 2017-03-26 VITALS — BP 148/82 | HR 61 | Ht 62.0 in | Wt 140.4 lb

## 2017-03-26 DIAGNOSIS — I493 Ventricular premature depolarization: Secondary | ICD-10-CM | POA: Diagnosis not present

## 2017-03-26 DIAGNOSIS — R002 Palpitations: Secondary | ICD-10-CM

## 2017-03-26 DIAGNOSIS — Z01818 Encounter for other preprocedural examination: Secondary | ICD-10-CM | POA: Diagnosis not present

## 2017-03-26 NOTE — Assessment & Plan Note (Signed)
[name] presents as a new patient for evaluation of symptomatic PVCs which she's had over the last month. These were demonstrated on the 12-lead EKG performed by her PCP, Dr. Yong Channel, and on her 12-lead today. She apparently is under some stress at home because of loss of a family member. She drinks 2 cups of coffee a day which she has transitioned to decaf. She has had some slight dizziness but no syncope. I am going to get a 2-D echocardiogram and a 2 week event monitor. TSH was normal. I will see her back in 3 months for follow-up.

## 2017-03-26 NOTE — Progress Notes (Signed)
03/26/2017 Tanya Leon   1941/06/12  [id number]  Primary Physician Tanya Channel Brayton Mars, MD Primary Cardiologist: [name] MD Renae Gloss  HPI:  Ms. Punch is a delightful 76 year old thin-appearing married Caucasian female mother of 2, grandmother, 4 grandchildren was accompanied by her husband Tanya Leon who is a patient of mine as well. She has no cardiac risk factors other than her mother died of a myocardial infarction at age 39. She has never had a heart attack or stroke. She denies chest pain or shortness of breath. She has noticed palpitations over the last month with some occasional dizziness. EKG performed by her PCP revealed PVCs as did her EKG today. She was drinking 2 cups of coffee a day which she has changed to decaf. Thyroid function tests were normal.    Current Outpatient Prescriptions  Medication Sig Dispense Refill  . AMBULATORY NON FORMULARY MEDICATION Evening Primerose Oil 1300 mg take 1 by mouth twice a day    . aspirin-acetaminophen-caffeine (EXCEDRIN EXTRA STRENGTH) 250-250-65 MG per tablet Take 2 tablets by mouth every 6 (six) hours as needed for headache. Reported on 11/03/2015    . azithromycin (AZASITE) 1 % ophthalmic solution Place 1 drop into both eyes every 30 (thirty) days. One week per month    . carboxymethylcellulose (REFRESH PLUS) 0.5 % SOLN Place 1 drop into both eyes daily as needed (Dry eyes.).     Marland Kitchen Cholecalciferol (VITAMIN D) 1000 UNITS capsule Take 2,000 Units by mouth every evening.     . Ciclopirox 1 % shampoo Apply 1 each topically every Friday.     . cycloSPORINE (RESTASIS) 0.05 % ophthalmic emulsion Place 1 drop into both eyes 2 (two) times daily.     . DULoxetine (CYMBALTA) 60 MG capsule Take 1 capsule (60 mg total) by mouth daily. 90 capsule 3  . estradiol (VIVELLE-DOT) 0.05 MG/24HR Place 1 patch onto the skin 2 (two) times a week. Reported on 11/03/2015 on thursdays and Sundays    . fluticasone (FLONASE) 50 MCG/ACT nasal spray  SHAKE LIQUID AND USE 2 SPRAYS IN EACH NOSTRIL DAILY 48 g 3  . Lutein 20 MG TABS Take 1 tablet by mouth every evening.     . Multiple Vitamins-Minerals (ICAPS AREDS 2) CAPS Take 1 capsule by mouth 2 (two) times daily.    . Multiple Vitamins-Minerals (MULTIVITAMIN PO) Take 1 tablet by mouth every morning.     . NONFORMULARY OR COMPOUNDED ITEM Apply 1-2 g topically daily. [employer] Nail lacquer: Fluconazole 2%, Terbinafine 1%, DMSO 120 each 11  . Olopatadine HCl (PATADAY) 0.2 % SOLN Place 1 drop into both eyes daily as needed (dry/itchy eyes.).     Marland Kitchen omega-3 acid ethyl esters (LOVAZA) 1 G capsule Take 1 g by mouth 2 (two) times daily.     . polyethylene glycol powder (GLYCOLAX/MIRALAX) powder Take 17 g by mouth every morning.     . pregabalin (LYRICA) 25 MG capsule Take 1 tablet in the morning and take 2 tablets in the evening 270 capsule 3  . Probiotic Product (ALIGN) 4 MG CAPS Take 1 capsule by mouth every morning.     Marland Kitchen tiZANidine (ZANAFLEX) 2 MG tablet Take 1 tablet (2 mg total) by mouth at bedtime. 90 tablet 1  . zolpidem (AMBIEN) 5 MG tablet Take 1 tablet (5 mg total) by mouth at bedtime as needed for sleep. 30 tablet 5   No current facility-administered medications for this visit.     Allergies  Allergen Reactions  . Shellfish Allergy Swelling    Social History   Social History  . Marital status: Married    Spouse name: N/A  . Number of children: 2  . Years of education: N/A   Occupational History  . cpa/retired   .  Retired   Social History Main Topics  . Smoking status: Never Smoker  . Smokeless tobacco: Never Used  . Alcohol use 0.0 oz/week     Comment: 1 glass of wine daily   . Drug use: No  . Sexual activity: Yes   Other Topics Concern  . Not on file   Social History Narrative   Married 37 years in 2015. 2 kids. 4 grandkids.       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 Previously [employer].      Review of  Systems: General: negative for chills, fever, night sweats or weight changes.  Cardiovascular: negative for chest pain, dyspnea on exertion, edema, orthopnea, palpitations, paroxysmal nocturnal dyspnea or shortness of breath Dermatological: negative for rash Respiratory: negative for cough or wheezing Urologic: negative for hematuria Abdominal: negative for nausea, vomiting, diarrhea, bright red blood per rectum, melena, or hematemesis Neurologic: negative for visual changes, syncope, or dizziness All other systems reviewed and are otherwise negative except as noted above.    Blood pressure (!) 148/82, pulse 61, height 5\' 2"  (1.575 m), weight 140 lb 6.4 oz (63.7 kg).  General appearance: alert and no distress Neck: no adenopathy, no carotid bruit, no JVD, supple, symmetrical, trachea midline and thyroid not enlarged, symmetric, no tenderness/mass/nodules Lungs: clear to auscultation bilaterally Heart: regular rate and rhythm, S1, S2 normal, no murmur, click, rub or gallop Extremities: extremities normal, atraumatic, no cyanosis or edema  EKG Sinus rhythm at 61 with occasional PVCs, incomplete right bundle-branch block and left anterior fascicular block. I personally reviewed this EKG.  ASSESSMENT AND PLAN:   PVC's (premature ventricular contractions) [name] presents as a new patient for evaluation of symptomatic PVCs which she's had over the last month. These were demonstrated on the 12-lead EKG performed by her PCP, Dr. Yong Channel, and on her 12-lead today. She apparently is under some stress at home because of loss of a family member. She drinks 2 cups of coffee a day which she has transitioned to decaf. She has had some slight dizziness but no syncope. I am going to get a 2-D echocardiogram and a 2 week event monitor. TSH was normal. I will see her back in 3 months for follow-up.      [name] MD FACP,FACC,FAHA, [facility] 03/26/2017 10:17 AM

## 2017-03-26 NOTE — Patient Instructions (Addendum)
Medication Instructions: Your physician recommends that you continue on your current medications as directed. Please refer to the Current Medication list given to you today.   Testing/Procedures: Your physician has requested that you have an echocardiogram. Echocardiography is a painless test that uses sound waves to create images of your heart. It provides your doctor with information about the size and shape of your heart and how well your heart's chambers and valves are working. This procedure takes approximately one hour. There are no restrictions for this procedure.  Your physician has recommended that you wear a 14 day event monitor. Event monitors are medical devices that record the heart's electrical activity. Doctors most often [place] these monitors to diagnose arrhythmias. Arrhythmias are problems with the speed or rhythm of the heartbeat. The monitor is a small, portable device. You can wear one while you do your normal daily activities. This is usually used to diagnose what is causing palpitations/syncope (passing out).  Within the next week or two--pt is scheduled for surgery on 04/09/17.  Follow-Up: Your physician recommends that you schedule a follow-up appointment as needed with Dr. Gwenlyn Found.  If you need a refill on your cardiac medications before your next appointment, please call your pharmacy.

## 2017-03-27 ENCOUNTER — Other Ambulatory Visit (HOSPITAL_COMMUNITY): Payer: Self-pay | Admitting: Obstetrics and Gynecology

## 2017-03-27 DIAGNOSIS — R42 Dizziness and giddiness: Secondary | ICD-10-CM | POA: Diagnosis not present

## 2017-03-27 DIAGNOSIS — R002 Palpitations: Secondary | ICD-10-CM | POA: Diagnosis not present

## 2017-03-27 DIAGNOSIS — I493 Ventricular premature depolarization: Secondary | ICD-10-CM | POA: Diagnosis not present

## 2017-03-31 NOTE — Patient Instructions (Signed)
Your procedure is scheduled on:  Tuesday, April 09, 2017  Enter through the [employer] of [facility] at:  10:45 AM  Pick up the phone at the desk and dial [phone].  Call this number if you have problems the morning of surgery: [phone].  Remember: Do NOT eat food:  After Midnight Monday  Do NOT drink clear liquids after:  6:00 AM Tuesday  Take these medicines the morning of surgery with a SIP OF WATER:  Duloxetine  Stop ALL herbal medications at this time  Do NOT smoke the day of surgery.  Do NOT wear jewelry (body piercing), metal hair clips/bobby pins, make-up, artifical eyelashes or nail polish. Do NOT wear lotions, powders, or perfumes.  You may wear deodorant. Do NOT shave for 48 hours prior to surgery. Do NOT bring valuables to the hospital. Contacts, dentures, or bridgework may not be worn into surgery.  Leave suitcase in car.  After surgery it may be brought to your room.  For patients admitted to the hospital, checkout time is 11:00 AM the day of discharge.  Have a responsible adult drive you home and stay with you for 24 hours after your procedure  Bring a copy of your healthcare power of attorney and living will documents.

## 2017-04-01 DIAGNOSIS — N760 Acute vaginitis: Secondary | ICD-10-CM | POA: Diagnosis not present

## 2017-04-01 NOTE — H&P (Signed)
76 y.o. yo complains of SUI and cystocele which are worsening.  SUI happens sometimes with full bladder but probably not as much as could secondary cystocele.  No UTIs but cystocele is now becoming uncomfortable.  Past Medical History:  Diagnosis Date  . Arthritis   . Cancer (HCC)    hx of skin cancer   . Colon polyps    adenomatous  . Complication of anesthesia    " pt stated they gave me too much - kept having to be reminded to breathe   . Constipation    opiod induced associated with back pain  . Depression    hx of   . Diverticulosis   . DIVERTICULOSIS, COLON 01/13/2009  . Heart murmur    benign, dagnosed around age 72,prophylactic antibiotic  . Low back pain   . Lumbar spondylolysis   . Osteopenia   . Pneumonia    hx of as a baby    Past Surgical History:  Procedure Laterality Date  . ABDOMINAL HERNIA REPAIR Right    incisional  . ABDOMINAL HYSTERECTOMY     with ovaries  . cataract left lens replacement     2016  . EYE MUSCLE SURGERY    . GLAUCOMA SURGERY Left 05/12/14  . incisional ab  05/28/11  . JOINT REPLACEMENT     right hip replacement   . LUMBAR FUSION     L4-L5  . PARTIAL KNEE ARTHROPLASTY Right   . PARTIAL KNEE ARTHROPLASTY Left   . perforated deverticular abscess/laporotomy and colostomy     depression after this  . stribismus eye surgery Right   . take down of colostomy    . tonsillectomly    . TOTAL KNEE REVISION Left 11/24/2015   Procedure: Conversion from left lateral unicompartmental knee arthroplasty to left total knee arthroplasty;  Surgeon: Mcarthur Rossetti, MD;  Location: [facility];  Service: Orthopedics;  Laterality: Left;    Social History   Social History  . Marital status: Married    Spouse name: N/A  . Number of children: 2  . Years of education: N/A   Occupational History  . cpa/retired   .  Retired   Social History Main Topics  . Smoking status: Never Smoker  . Smokeless tobacco: Never Used  . Alcohol use 0.0 oz/week    Comment: 1 glass of wine daily   . Drug use: No  . Sexual activity: Yes   Other Topics Concern  . Not on file   Social History Narrative   Married 37 years in 2015. 2 kids. 4 grandkids.       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 Previously [employer].     No current facility-administered medications on file prior to encounter.    Current Outpatient Prescriptions on File Prior to Encounter  Medication Sig Dispense Refill  . AMBULATORY NON FORMULARY MEDICATION Evening Primerose Oil 1300 mg take 1 by mouth twice a day    . aspirin-acetaminophen-caffeine (EXCEDRIN EXTRA STRENGTH) 250-250-65 MG per tablet Take 2 tablets by mouth every 6 (six) hours as needed for headache. Reported on 11/03/2015    . azithromycin (AZASITE) 1 % ophthalmic solution Place 1 drop into both eyes every 30 (thirty) days. One week per month    . carboxymethylcellulose (REFRESH PLUS) 0.5 % SOLN Place 1 drop into both eyes daily as needed (Dry eyes.).     Marland Kitchen Cholecalciferol (VITAMIN D) 1000 UNITS capsule Take 2,000  Units by mouth every evening.     . Ciclopirox 1 % shampoo Apply 1 each topically every Friday.     . cycloSPORINE (RESTASIS) 0.05 % ophthalmic emulsion Place 1 drop into both eyes 2 (two) times daily.     . DULoxetine (CYMBALTA) 60 MG capsule Take 1 capsule (60 mg total) by mouth daily. 90 capsule 3  . estradiol (VIVELLE-DOT) 0.05 MG/24HR Place 1 patch onto the skin 2 (two) times a week. Reported on 11/03/2015 on thursdays and Sundays    . fluticasone (FLONASE) 50 MCG/ACT nasal spray SHAKE LIQUID AND USE 2 SPRAYS IN EACH NOSTRIL DAILY 48 g 3  . Lutein 20 MG TABS Take 1 tablet by mouth every evening.     . Multiple Vitamins-Minerals (ICAPS AREDS 2) CAPS Take 1 capsule by mouth 2 (two) times daily.    . Multiple Vitamins-Minerals (MULTIVITAMIN PO) Take 1 tablet by mouth every morning.     . NONFORMULARY OR COMPOUNDED ITEM Apply 1-2 g topically daily. [employer] N[employer]  lacquer: Fluconazole 2%, Terbinafine 1%, DMSO 120 each 11  . Olopatadine HCl (PATADAY) 0.2 % SOLN Place 1 drop into both eyes daily as needed (dry/itchy eyes.).     Marland Kitchen omega-3 acid ethyl esters (LOVAZA) 1 G capsule Take 1 g by mouth 2 (two) times daily.     . polyethylene glycol powder (GLYCOLAX/MIRALAX) powder Take 17 g by mouth every morning.     . pregabalin (LYRICA) 25 MG capsule Take 1 tablet in the morning and take 2 tablets in the evening 270 capsule 3  . Probiotic Product (ALIGN) 4 MG CAPS Take 1 capsule by mouth every morning.     . zolpidem (AMBIEN) 5 MG tablet Take 1 tablet (5 mg total) by mouth at bedtime as needed for sleep. 30 tablet 5    Allergies  Allergen Reactions  . Shellfish Allergy Swelling    @VITALS2 @  Lungs: clear to ascultation Cor:  RRR Abdomen:  soft, nontender, nondistended. Ex:  no cords, erythema Pelvic:  Vulva: no masses, no atrophy, no lesions   Vagina: no tenderness, no erythema, no abnormal vaginal discharge, no vesicle(s) or ulcers, no rectocele, cystocele (to -1, L side wall prolapse, midline prolapse, now to hymen.) Cervix: grossly normal, no discharge, no cervical motion tenderness (suspended cuff; PE amended on 02-28-17 to reflect no pap done) Bladder/Urethra: no urethral discharge, no urethral mass, bladder non distended, Urethra hypermobile Adnexa/Parametria: no parametrial tenderness, no parametrial mass, no adnexal tenderness, no ovarian mass  A:  Symptomatic cystocele with urethral hypermobility.   P: All risks, benefits and alternatives d/w patient and she desires to proceed.  Patientwill get  SCDs during the operation.     Froylan Hobby A

## 2017-04-03 ENCOUNTER — Encounter (HOSPITAL_COMMUNITY): Payer: Self-pay

## 2017-04-03 ENCOUNTER — Encounter (HOSPITAL_COMMUNITY)
Admission: RE | Admit: 2017-04-03 | Discharge: 2017-04-03 | Disposition: A | Discharge status: Home / Self Care | Payer: Medicare Other | Source: Ambulatory Visit | Attending: Obstetrics and Gynecology | Admitting: Obstetrics and Gynecology

## 2017-04-03 ENCOUNTER — Encounter: Payer: Self-pay | Admitting: Family Medicine

## 2017-04-03 DIAGNOSIS — Z01818 Encounter for other preprocedural examination: Secondary | ICD-10-CM | POA: Insufficient documentation

## 2017-04-03 DIAGNOSIS — Z0183 Encounter for blood typing: Secondary | ICD-10-CM | POA: Diagnosis not present

## 2017-04-03 DIAGNOSIS — R002 Palpitations: Secondary | ICD-10-CM | POA: Insufficient documentation

## 2017-04-03 DIAGNOSIS — N393 Stress incontinence (female) (male): Secondary | ICD-10-CM | POA: Diagnosis not present

## 2017-04-03 DIAGNOSIS — Z01812 Encounter for preprocedural laboratory examination: Secondary | ICD-10-CM | POA: Insufficient documentation

## 2017-04-03 DIAGNOSIS — I081 Rheumatic disorders of both mitral and tricuspid valves: Secondary | ICD-10-CM | POA: Diagnosis not present

## 2017-04-03 HISTORY — DX: Headache, unspecified: R51.9

## 2017-04-03 HISTORY — DX: Headache: R51

## 2017-04-03 HISTORY — DX: Ventricular premature depolarization: I49.3

## 2017-04-03 LAB — CBC
HCT: 41.1 % (ref 36.0–46.0)
Hemoglobin: 13.9 g/dL (ref 12.0–15.0)
MCH: 31 pg (ref 26.0–34.0)
MCHC: 33.8 g/dL (ref 30.0–36.0)
MCV: 91.7 fL (ref 78.0–100.0)
Platelets: 145 10*3/uL — ABNORMAL LOW (ref 150–400)
RBC: 4.48 MIL/uL (ref 3.87–5.11)
RDW: 13.8 % (ref 11.5–15.5)
WBC: 4.9 10*3/uL (ref 4.0–10.5)

## 2017-04-03 LAB — TYPE AND SCREEN
ABO/RH(D): B POS
Antibody Screen: NEGATIVE

## 2017-04-04 ENCOUNTER — Ambulatory Visit (HOSPITAL_BASED_OUTPATIENT_CLINIC_OR_DEPARTMENT_OTHER): Payer: Medicare Other

## 2017-04-04 ENCOUNTER — Other Ambulatory Visit: Payer: Self-pay

## 2017-04-04 ENCOUNTER — Telehealth: Payer: Self-pay | Admitting: Cardiovascular Disease

## 2017-04-04 DIAGNOSIS — R002 Palpitations: Secondary | ICD-10-CM

## 2017-04-04 DIAGNOSIS — I493 Ventricular premature depolarization: Secondary | ICD-10-CM

## 2017-04-04 DIAGNOSIS — I081 Rheumatic disorders of both mitral and tricuspid valves: Secondary | ICD-10-CM | POA: Diagnosis not present

## 2017-04-04 DIAGNOSIS — Z01818 Encounter for other preprocedural examination: Secondary | ICD-10-CM

## 2017-04-04 DIAGNOSIS — Z0183 Encounter for blood typing: Secondary | ICD-10-CM | POA: Diagnosis not present

## 2017-04-04 DIAGNOSIS — Z01812 Encounter for preprocedural laboratory examination: Secondary | ICD-10-CM | POA: Diagnosis not present

## 2017-04-04 DIAGNOSIS — N393 Stress incontinence (female) (male): Secondary | ICD-10-CM | POA: Diagnosis not present

## 2017-04-04 NOTE — Telephone Encounter (Signed)
New message      Pt is wearing an event monitor.  She is have surgery soon.  The anesthesiologist want [place] to fax the most recent monitor readings to them.  [address] did not put on monitor.  Please call women[facility] pre op and let them know if we have monitor readings

## 2017-04-04 NOTE — Pre-Procedure Instructions (Signed)
Spoke with Dr. Jillyn Hidden about Tanya Leon, he has requested interpretation of the holter monitor she is currently wearing prior to surgery.  I have left a message at Dr. Kennon Holter office and I'm awaiting a reply back at this time.

## 2017-04-04 NOTE — Pre-Procedure Instructions (Signed)
Discussed holter monitor strip with Dr. Jillyn Hidden, no new orders received at this time.  Will place strip on chart.

## 2017-04-04 NOTE — Telephone Encounter (Signed)
Returned call to [facility] Patient has procedure on 7/11 Printed monitor strip available  Notified Tameka I will fax  Fax: [phone]

## 2017-04-09 ENCOUNTER — Ambulatory Visit (HOSPITAL_COMMUNITY): Payer: Medicare Other | Admitting: Anesthesiology

## 2017-04-09 ENCOUNTER — Encounter (HOSPITAL_COMMUNITY): Payer: Self-pay | Admitting: *Deleted

## 2017-04-09 ENCOUNTER — Ambulatory Visit (HOSPITAL_COMMUNITY)
Admission: RE | Admit: 2017-04-09 | Discharge: 2017-04-09 | Disposition: A | Discharge status: Home / Self Care | Payer: Medicare Other | Source: Ambulatory Visit | Attending: Obstetrics and Gynecology | Admitting: Obstetrics and Gynecology

## 2017-04-09 ENCOUNTER — Encounter (HOSPITAL_COMMUNITY)
Admission: RE | Disposition: A | Discharge status: Home / Self Care | Payer: Self-pay | Source: Ambulatory Visit | Attending: Obstetrics and Gynecology

## 2017-04-09 DIAGNOSIS — Z7951 Long term (current) use of inhaled steroids: Secondary | ICD-10-CM | POA: Diagnosis not present

## 2017-04-09 DIAGNOSIS — F329 Major depressive disorder, single episode, unspecified: Secondary | ICD-10-CM | POA: Insufficient documentation

## 2017-04-09 DIAGNOSIS — Z85828 Personal history of other malignant neoplasm of skin: Secondary | ICD-10-CM | POA: Diagnosis not present

## 2017-04-09 DIAGNOSIS — N8110 Cystocele, unspecified: Secondary | ICD-10-CM | POA: Insufficient documentation

## 2017-04-09 DIAGNOSIS — Z96653 Presence of artificial knee joint, bilateral: Secondary | ICD-10-CM | POA: Insufficient documentation

## 2017-04-09 DIAGNOSIS — J309 Allergic rhinitis, unspecified: Secondary | ICD-10-CM | POA: Diagnosis not present

## 2017-04-09 DIAGNOSIS — Z79899 Other long term (current) drug therapy: Secondary | ICD-10-CM | POA: Insufficient documentation

## 2017-04-09 DIAGNOSIS — K635 Polyp of colon: Secondary | ICD-10-CM | POA: Diagnosis not present

## 2017-04-09 DIAGNOSIS — N393 Stress incontinence (female) (male): Secondary | ICD-10-CM | POA: Insufficient documentation

## 2017-04-09 DIAGNOSIS — Z96641 Presence of right artificial hip joint: Secondary | ICD-10-CM | POA: Insufficient documentation

## 2017-04-09 HISTORY — PX: CYSTOSCOPY: SHX5120

## 2017-04-09 HISTORY — PX: BLADDER SUSPENSION: SHX72

## 2017-04-09 HISTORY — PX: CYSTOCELE REPAIR: SHX163

## 2017-04-09 SURGERY — URETHROPEXY, USING TRANSVAGINAL TAPE
Anesthesia: General | Site: Vagina

## 2017-04-09 MED ORDER — LIDOCAINE HCL 2 % EX GEL
CUTANEOUS | Status: AC
  Filled 2017-04-09: qty 5

## 2017-04-09 MED ORDER — PROPOFOL 10 MG/ML IV BOLUS
INTRAVENOUS | Status: AC
  Filled 2017-04-09: qty 20

## 2017-04-09 MED ORDER — OXYCODONE-ACETAMINOPHEN 5-325 MG PO TABS: 1.0000 | ORAL_TABLET | ORAL | 0 refills | Status: DC | PRN

## 2017-04-09 MED ORDER — ESTRADIOL 0.1 MG/GM VA CREA
TOPICAL_CREAM | VAGINAL | Status: DC | PRN
  Administered 2017-04-09: 1 via VAGINAL

## 2017-04-09 MED ORDER — STERILE WATER FOR IRRIGATION IR SOLN
Status: DC | PRN
  Administered 2017-04-09: 1000 mL

## 2017-04-09 MED ORDER — ACETAMINOPHEN 160 MG/5ML PO SOLN
650.0000 mg | Freq: Once | ORAL | Status: AC
  Administered 2017-04-09: 650 mg via ORAL

## 2017-04-09 MED ORDER — ACETAMINOPHEN 160 MG/5ML PO SOLN
ORAL | Status: AC
  Administered 2017-04-09: 650 mg via ORAL
  Filled 2017-04-09: qty 20.3

## 2017-04-09 MED ORDER — MIDAZOLAM HCL 2 MG/2ML IJ SOLN
INTRAMUSCULAR | Status: DC | PRN
  Administered 2017-04-09: 0.5 mg via INTRAVENOUS
  Administered 2017-04-09: 1 mg via INTRAVENOUS
  Administered 2017-04-09: 0.5 mg via INTRAVENOUS

## 2017-04-09 MED ORDER — FENTANYL CITRATE (PF) 100 MCG/2ML IJ SOLN
INTRAMUSCULAR | Status: AC
  Filled 2017-04-09: qty 2

## 2017-04-09 MED ORDER — BUPIVACAINE IN DEXTROSE 0.75-8.25 % IT SOLN
INTRATHECAL | Status: AC
  Filled 2017-04-09: qty 2

## 2017-04-09 MED ORDER — ESTRADIOL 0.1 MG/GM VA CREA
TOPICAL_CREAM | VAGINAL | Status: AC
  Filled 2017-04-09: qty 42.5

## 2017-04-09 MED ORDER — LIDOCAINE HCL (CARDIAC) 20 MG/ML IV SOLN
INTRAVENOUS | Status: DC | PRN
  Administered 2017-04-09: 50 mg via INTRAVENOUS

## 2017-04-09 MED ORDER — LACTATED RINGERS IV SOLN
INTRAVENOUS | Status: DC
  Administered 2017-04-09: 12:00:00 via INTRAVENOUS

## 2017-04-09 MED ORDER — FENTANYL CITRATE (PF) 250 MCG/5ML IJ SOLN
INTRAMUSCULAR | Status: DC | PRN
  Administered 2017-04-09 (×2): 25 ug via INTRAVENOUS
  Administered 2017-04-09: 50 ug via INTRAVENOUS

## 2017-04-09 MED ORDER — DEXAMETHASONE SODIUM PHOSPHATE 4 MG/ML IJ SOLN
INTRAMUSCULAR | Status: DC | PRN
  Administered 2017-04-09: 4 mg via INTRAVENOUS

## 2017-04-09 MED ORDER — PROPOFOL 500 MG/50ML IV EMUL
INTRAVENOUS | Status: DC | PRN
  Administered 2017-04-09: 50 ug/kg/min via INTRAVENOUS

## 2017-04-09 MED ORDER — PHENYLEPHRINE HCL 10 MG/ML IJ SOLN
INTRAMUSCULAR | Status: DC | PRN
  Administered 2017-04-09: 40 ug via INTRAVENOUS

## 2017-04-09 MED ORDER — PROPOFOL 10 MG/ML IV BOLUS
INTRAVENOUS | Status: DC | PRN
  Administered 2017-04-09: 10 mg via INTRAVENOUS
  Administered 2017-04-09: 20 mg via INTRAVENOUS
  Administered 2017-04-09: 10 mg via INTRAVENOUS

## 2017-04-09 MED ORDER — OXYCODONE-ACETAMINOPHEN 5-325 MG PO TABS
ORAL_TABLET | ORAL | Status: AC
  Filled 2017-04-09: qty 1

## 2017-04-09 MED ORDER — FENTANYL CITRATE (PF) 100 MCG/2ML IJ SOLN: 25.0000 ug | INTRAMUSCULAR | Status: DC | PRN

## 2017-04-09 MED ORDER — MIDAZOLAM HCL 2 MG/2ML IJ SOLN
INTRAMUSCULAR | Status: AC
  Filled 2017-04-09: qty 2

## 2017-04-09 MED ORDER — LIDOCAINE-EPINEPHRINE (PF) 1 %-1:200000 IJ SOLN
INTRAMUSCULAR | Status: AC
  Filled 2017-04-09: qty 30

## 2017-04-09 MED ORDER — LIDOCAINE-EPINEPHRINE (PF) 1 %-1:200000 IJ SOLN
INTRAMUSCULAR | Status: DC | PRN
  Administered 2017-04-09: 5 mL

## 2017-04-09 MED ORDER — OXYCODONE-ACETAMINOPHEN 5-325 MG PO TABS
1.0000 | ORAL_TABLET | ORAL | Status: DC | PRN
  Administered 2017-04-09: 1 via ORAL

## 2017-04-09 MED ORDER — ONDANSETRON HCL 4 MG/2ML IJ SOLN
INTRAMUSCULAR | Status: DC | PRN
  Administered 2017-04-09: 4 mg via INTRAVENOUS

## 2017-04-09 SURGICAL SUPPLY — 30 items
ADH SKN CLS APL DERMABOND .7 (GAUZE/BANDAGES/DRESSINGS) ×3
AGENT HMST KT MTR STRL THRMB (HEMOSTASIS) ×3
BLADE SURG 15 STRL LF C SS BP (BLADE) ×3 IMPLANT
BLADE SURG 15 STRL SS (BLADE) ×4
CANISTER SUCT 3000ML PPV (MISCELLANEOUS) ×4 IMPLANT
CLOTH BEACON ORANGE TIMEOUT ST (SAFETY) ×4 IMPLANT
CONT PATH 16OZ SNAP LID 3702 (MISCELLANEOUS) IMPLANT
DECANTER SPIKE VIAL GLASS SM (MISCELLANEOUS) ×4 IMPLANT
DERMABOND ADVANCED (GAUZE/BANDAGES/DRESSINGS) ×1
DERMABOND ADVANCED .7 DNX12 (GAUZE/BANDAGES/DRESSINGS) ×3 IMPLANT
GAUZE PACKING 2X5 YD STRL (GAUZE/BANDAGES/DRESSINGS) ×4 IMPLANT
GLOVE BIO SURGEON STRL SZ7 (GLOVE) ×4 IMPLANT
GLOVE BIOGEL PI IND STRL 7.0 (GLOVE) ×3 IMPLANT
GLOVE BIOGEL PI INDICATOR 7.0 (GLOVE) ×1
GOWN STRL REUS W/TWL LRG LVL3 (GOWN DISPOSABLE) ×8 IMPLANT
MARKER SKIN DUAL TIP RULER LAB (MISCELLANEOUS) IMPLANT
NEEDLE HYPO 22GX1.5 SAFETY (NEEDLE) ×4 IMPLANT
NS IRRIG 1000ML POUR BTL (IV SOLUTION) ×4 IMPLANT
PACK VAGINAL WOMENS (CUSTOM PROCEDURE TRAY) ×4 IMPLANT
SET CYSTO W/LG BORE CLAMP LF (SET/KITS/TRAYS/PACK) ×4 IMPLANT
SLING TRANS VAGINAL TAPE (Sling) ×1 IMPLANT
SLING UTERINE/ABD GYNECARE TVT (Sling) ×3 IMPLANT
SURGIFLO W/THROMBIN 8M KIT (HEMOSTASIS) ×1 IMPLANT
SUT VIC AB 0 CT1 18XCR BRD8 (SUTURE) ×3 IMPLANT
SUT VIC AB 0 CT1 8-18 (SUTURE) ×4
SUT VIC AB 2-0 CT1 27 (SUTURE) ×4
SUT VIC AB 2-0 CT1 TAPERPNT 27 (SUTURE) ×3 IMPLANT
SUT VIC AB 2-0 UR6 27 (SUTURE) IMPLANT
TOWEL OR 17X24 6PK STRL BLUE (TOWEL DISPOSABLE) ×8 IMPLANT
TRAY FOLEY CATH SILVER 14FR (SET/KITS/TRAYS/PACK) ×4 IMPLANT

## 2017-04-09 NOTE — Transfer of Care (Signed)
Immediate Anesthesia Transfer of Care Note  Patient: Tanya Leon  Procedure(s) Performed: Procedure(s): TRANSVAGINAL TAPE (TVT) PROCEDURE (N/A) CYSTOSCOPY (N/A) ANTERIOR REPAIR (CYSTOCELE) (N/A)  Patient Location: PACU  Anesthesia Type:Spinal  Level of Consciousness: awake, alert  and oriented  Airway & Oxygen Therapy: Patient Spontanous Breathing and Patient connected to nasal cannula oxygen  Post-op Assessment: Report given to RN and Post -op Vital signs reviewed and stable  Post vital signs: Reviewed and stable  Last Vitals:  Vitals:   04/09/17 1104  BP: 124/79  Pulse: 63  Resp: 16  Temp: 36.6 C    Last Pain:  Vitals:   04/09/17 1104  TempSrc: Oral      Patients Stated Pain Goal: 2 (38/18/40 3754)  Complications: No apparent anesthesia complications

## 2017-04-09 NOTE — Progress Notes (Signed)
Teaching complete with patient and her husband on foley management. Demonstration given  with return demonstration (teach back) method: on foley care, emptying device, changing to leg bag, use of leg straps and hand hygiene. Patient and her husband state they feel equipped to perform these functions. Supplies sent home with patient to manage care. All questions asked and answered.

## 2017-04-09 NOTE — Anesthesia Postprocedure Evaluation (Signed)
Anesthesia Post Note  Patient: Tanya Leon  Procedure(s) Performed: Procedure(s) (LRB): TRANSVAGINAL TAPE (TVT) PROCEDURE (N/A) CYSTOSCOPY (N/A) ANTERIOR REPAIR (CYSTOCELE) (N/A)     Patient location during evaluation: PACU Anesthesia Type: General Level of consciousness: awake Pain management: satisfactory to patient Vital Signs Assessment: post-procedure vital signs reviewed and stable Respiratory status: spontaneous breathing Cardiovascular status: blood pressure returned to baseline Postop Assessment: no headache and spinal receding Anesthetic complications: no    Last Vitals:  Vitals:   04/09/17 1545 04/09/17 1642  BP: 122/65 114/64  Pulse: 79 80  Resp: 19 16  Temp: 36.5 C 36.7 C    Last Pain:  Vitals:   04/09/17 1642  TempSrc: Oral  PainSc: 4    Pain Goal: Patients Stated Pain Goal: 2 (04/09/17 1642)               Lyndle Herrlich EDWARD

## 2017-04-09 NOTE — Anesthesia Procedure Notes (Signed)
Spinal  Patient location during procedure: OR Staffing Anesthesiologist: Lyndle Herrlich Preanesthetic Checklist Completed: patient identified, site marked, surgical consent, pre-op evaluation, timeout performed, IV checked, risks and benefits discussed and monitors and equipment checked Spinal Block Patient position: sitting Prep: DuraPrep Patient monitoring: heart rate, cardiac monitor, continuous pulse ox and blood pressure Approach: midline Location: L3-4 Injection technique: single-shot Needle Needle type: Sprotte  Needle gauge: 24 G Needle length: 9 cm Assessment Sensory level: T4 Additional Notes Spinal Dosage in OR  .75% Bupivicaine ml       1.3

## 2017-04-09 NOTE — Progress Notes (Signed)
There has been no change in the patients history, status or exam since the history and physical.  There were no vitals filed for this visit.  Lab Results  Component Value Date   WBC 4.9 04/03/2017   HGB 13.9 04/03/2017   HCT 41.1 04/03/2017   MCV 91.7 04/03/2017   PLT 145 (L) 04/03/2017    Abou Sterkel A

## 2017-04-09 NOTE — Op Note (Signed)
04/09/2017  12:58 PM  PATIENT:  Tanya Leon  76 y.o. female  PRE-OPERATIVE DIAGNOSIS:  SUI CYSTOCELE  POST-OPERATIVE DIAGNOSIS:  SUI CYSTOCELE  PROCEDURE:  Procedure(s): TRANSVAGINAL TAPE (TVT) PROCEDURE (N/A) CYSTOSCOPY (N/A) ANTERIOR REPAIR (CYSTOCELE) (N/A)  SURGEON:  Surgeon(s) and Role:    * Bobbye Charleston, MD - Primary  ASSISTANTS: Jerelyn Charles   ANESTHESIA:   spinal  EBL:  Total I/O In: -  Out: 110 [Urine:100; Blood:10]  LOCAL MEDICATIONS USED:  LIDOCAINE, estrace cream, surgiflo  SPECIMEN:  No Specimen   Findings: high cystocele down to -1; normal uterus.  Normal bladder and no needles in bladder.  Ureters seen working.  Complications: none.  DISPOSITION OF SPECIMEN:  N/A  COUNTS:  YES  TOURNIQUET:  * No tourniquets in log *  DICTATION: .Note written in [place]: Discharge to home after PACU  PATIENT DISPOSITION:  PACU - hemodynamically stable.   Delay start of Pharmacological VTE agent (>24hrs) due to surgical blood loss or risk of bleeding: not applicable  After spinal anesthesia was administered, the patient was prepped and draped in the usual sterile fashion.  The uterus was well suspended and we elected to leave it in place.  A foley was placed in the urethra and the apex of the cystocele grasped with two allises.  Allises were used to grasp the vaginal mucosa in the midline superior to inferior just below the urethral opening.  The mucosa was injected with 1% lidocaine with epi in the midline and a scalpel used to incise the vaginal mucosa vertically.  Allises were used to retract the vaginal mucosa as the vesico-vaginal fascia was removed from the mucosa with blunt and sharp dissection and adequate room midurethral to pelvic floor bilaterally was developed.  Two small puncture incisions were then made two cm from midline just above the pubic symphysis.  The abdominal needles from the Gynecare TVT set were introduced behind the pubic  bone, through the pelvic floor and out the vagina carefully on each respective side, being careful to hug the posterior of the pubic bone.  The foley was removed and indigo carmine given.  Cystoscopy revealed excellent bilateral spill from each ureteral orifice and NO NEEDLES IN THE BLADDER.   The urethra was clear as well.  The foley was replaced and the vaginal needles attached to the abdominal needles.  The tape was pulled into place through the abdominal incisions, the sheaths removed while a kelly was in place under the mesh sling and the tape cut at the level of just below the skin.  The tape was checked and found to be tight enough and laying flat.   Surigflo  was placed in the corners up by the pelvic floor to achieve hemostasis of some small bleeders out of reach and too close to the sling for stitches.  Once hemostasis was achieved, two mattress stitches of 0-vicryl were placed to close the vesico-vaginal fascia under the bladder.  The vaginal mucosa was trimmed and then closed with a running lock stitch of 2-0 vicryl.  A vaginal packing with estrace was placed and the patient returned to the recovery room in stable condition.

## 2017-04-09 NOTE — Anesthesia Preprocedure Evaluation (Signed)
Anesthesia Evaluation  Patient identified by MRN, date of birth, ID band Patient awake    Reviewed: Allergy & Precautions, H&P , Patient's Chart, lab work & pertinent test results, reviewed documented beta blocker date and time   Airway Mallampati: II  TM Distance: >3 FB Neck ROM: full    Dental no notable dental hx.    Pulmonary    Pulmonary exam normal breath sounds clear to auscultation       Cardiovascular  Rhythm:regular Rate:Normal     Neuro/Psych    GI/Hepatic   Endo/Other    Renal/GU      Musculoskeletal   Abdominal   Peds  Hematology   Anesthesia Other Findings Had SAB for knee surg  Reproductive/Obstetrics                             Anesthesia Physical Anesthesia Plan  ASA: II  Anesthesia Plan: General   Post-op Pain Management:    Induction: Intravenous  PONV Risk Score and Plan:   Airway Management Planned: LMA  Additional Equipment:   Intra-op Plan:   Post-operative Plan:   Informed Consent: I have reviewed the patients History and Physical, chart, labs and discussed the procedure including the risks, benefits and alternatives for the proposed anesthesia with the patient or authorized representative who has indicated his/her understanding and acceptance.   Dental Advisory Given  Plan Discussed with: CRNA and Surgeon  Anesthesia Plan Comments: ( )        Anesthesia Quick Evaluation

## 2017-04-09 NOTE — Discharge Instructions (Signed)
Urethral Vaginal Sling, Care After °Refer to this sheet in the next few weeks. These instructions provide you with information on caring for yourself after your procedure. Your health care provider may also give you more specific instructions. Your treatment has been planned according to current medical practices, but problems sometimes occur. Call your health care provider if you have any problems or questions after your procedure. °What can I expect after the procedure? °After your procedure, it is typical to have the following: °· A catheter in your bladder until your bladder is able to work on its own properly. You will be instructed on how to empty the catheter bag. °· Absorbable stitches in your incisions. They will slowly dissolve over 1-2 months. ° °Follow these instructions at home: °· Get plenty of rest. °· Only take over-the-counter or prescription medicines as directed by your health care provider. Do not take aspirin because it can cause bleeding. °· Do not take baths. Take showers until your health care provider tells you otherwise. °· You may resume your usual diet. Eat a well-balanced diet. °· Drink enough fluids to keep your urine clear or pale yellow. °· Limit exercise and activities as directed by your health care provider. Do not lift anything heavier than 5 pounds (2.3 kg). °· Do not douche, use tampons, or have sexual intercourse for 6 weeks after your procedure. °· Follow up with your health care provider as directed. °Contact a health care provider if: °· You have a heavy or bad smelling vaginal discharge. °· You have a rash. °· You have pain that is not controlled with medicines. °· You have lightheadedness or feel faint. °Get help right away if: °· You have a fever. °· You have vaginal bleeding. °· You faint. °· You have shortness of breath. °· You have chest, abdominal, or leg pain. °· You have pain when urinating or cannot urinate. °· Your catheter is still in your bladder and becomes  blocked. °· You have swelling, redness, and pain in the vaginal area. °This information is not intended to replace advice given to you by your health care provider. Make sure you discuss any questions you have with your health care provider. °Document Released: 07/07/2013 Document Revised: 02/22/2016 Document Reviewed: 03/05/2013 °Elsevier Interactive Patient Education © 2018 [employer] ° ° °Post Anesthesia Home Care Instructions ° °Activity: °Get plenty of rest for the remainder of the day. A responsible individual must stay with you for 24 hours following the procedure.  °For the next 24 hours, DO NOT: °-Drive a car °-Operate machinery °-Drink alcoholic beverages °-Take any medication unless instructed by your physician °-Make any legal decisions or sign important papers. ° °Meals: °Start with liquid foods such as gelatin or soup. Progress to regular foods as tolerated. Avoid greasy, spicy, heavy foods. If nausea and/or vomiting occur, drink only clear liquids until the nausea and/or vomiting subsides. Call your physician if vomiting continues. ° °Special Instructions/Symptoms: °Your throat may feel dry or sore from the anesthesia or the breathing tube placed in your throat during surgery. If this causes discomfort, gargle with warm salt water. The discomfort should disappear within 24 hours. ° °If you had a scopolamine patch placed behind your ear for the management of post- operative nausea and/or vomiting: ° °1. The medication in the patch is effective for 72 hours, after which it should be removed.  Wrap patch in a tissue and discard in the trash. Wash hands thoroughly with soap and water. °2. You may remove the   patch earlier than 72 hours if you experience unpleasant side effects which may include dry mouth, dizziness or visual disturbances. °3. Avoid touching the patch. Wash your hands with soap and water after contact with the patch. °  ° ° °

## 2017-04-10 ENCOUNTER — Encounter (HOSPITAL_COMMUNITY): Payer: Self-pay | Admitting: Obstetrics and Gynecology

## 2017-04-16 ENCOUNTER — Encounter: Payer: Self-pay | Admitting: Family Medicine

## 2017-04-16 NOTE — Progress Notes (Signed)
Pt presents with mycotic infection of nails 1-5 bilateral  All other systems are negative  Laser therapy administered to affected nails and tolerated well. All safety precautions were in place. Re-appointed prn 

## 2017-04-21 ENCOUNTER — Encounter: Payer: Self-pay | Admitting: Family Medicine

## 2017-04-24 ENCOUNTER — Ambulatory Visit: Payer: Medicare Other

## 2017-04-27 ENCOUNTER — Other Ambulatory Visit: Payer: Self-pay | Admitting: Family Medicine

## 2017-04-29 DIAGNOSIS — H903 Sensorineural hearing loss, bilateral: Secondary | ICD-10-CM | POA: Diagnosis not present

## 2017-04-29 DIAGNOSIS — H608X3 Other otitis externa, bilateral: Secondary | ICD-10-CM | POA: Diagnosis not present

## 2017-04-30 ENCOUNTER — Telehealth: Payer: Self-pay | Admitting: Internal Medicine

## 2017-04-30 NOTE — Telephone Encounter (Signed)
Dr. Hilarie Fredrickson will you accept this pt?

## 2017-04-30 NOTE — Telephone Encounter (Signed)
ok 

## 2017-04-30 NOTE — Telephone Encounter (Signed)
Sure

## 2017-04-30 NOTE — Telephone Encounter (Signed)
Dr. Henrene Pastor will you approve this switch? See note below.

## 2017-05-01 NOTE — Telephone Encounter (Signed)
Patient has been notified of this and requested an office visit before scheduling colonoscopy. An appointment has been scheduled with the patient.

## 2017-05-12 ENCOUNTER — Ambulatory Visit: Payer: Medicare Other

## 2017-05-19 ENCOUNTER — Ambulatory Visit (INDEPENDENT_AMBULATORY_CARE_PROVIDER_SITE_OTHER): Payer: Medicare Other | Admitting: Podiatry

## 2017-05-19 DIAGNOSIS — B351 Tinea unguium: Secondary | ICD-10-CM

## 2017-05-22 ENCOUNTER — Encounter: Payer: Self-pay | Admitting: Family Medicine

## 2017-05-23 NOTE — Progress Notes (Signed)
Pt presents with mycotic infection of nails 1-5 bilateral  All other systems are negative  Laser therapy administered to affected nails and tolerated well. All safety precautions were in place. Re-appointed prn

## 2017-05-26 ENCOUNTER — Ambulatory Visit: Payer: Medicare Other

## 2017-05-26 ENCOUNTER — Ambulatory Visit: Payer: Medicare Other | Admitting: Family Medicine

## 2017-06-09 DIAGNOSIS — H35372 Puckering of macula, left eye: Secondary | ICD-10-CM | POA: Diagnosis not present

## 2017-06-09 DIAGNOSIS — H401132 Primary open-angle glaucoma, bilateral, moderate stage: Secondary | ICD-10-CM | POA: Diagnosis not present

## 2017-06-09 DIAGNOSIS — H353132 Nonexudative age-related macular degeneration, bilateral, intermediate dry stage: Secondary | ICD-10-CM | POA: Diagnosis not present

## 2017-06-09 DIAGNOSIS — H35371 Puckering of macula, right eye: Secondary | ICD-10-CM | POA: Diagnosis not present

## 2017-06-11 DIAGNOSIS — J069 Acute upper respiratory infection, unspecified: Secondary | ICD-10-CM | POA: Diagnosis not present

## 2017-06-11 DIAGNOSIS — J029 Acute pharyngitis, unspecified: Secondary | ICD-10-CM | POA: Diagnosis not present

## 2017-06-17 ENCOUNTER — Encounter: Payer: Self-pay | Admitting: *Deleted

## 2017-06-23 ENCOUNTER — Other Ambulatory Visit: Payer: Self-pay | Admitting: Family Medicine

## 2017-06-27 NOTE — Progress Notes (Signed)
Subjective:   Tanya Leon is a 76 y.o. female who presents for Medicare Annual (Subsequent) preventive examination.  Review of Systems:  No ROS.  Medicare Wellness Visit. Additional risk factors are reflected in the social history.  Cardiac Risk Factors include: advanced age (>61men, >33 women)   Sleep patterns:  States she is tired because they just got back from a 2 week trip. Sleeps 7-8 hrs/night. Gets up 2-3x/night to use restroom. Home Safety/Smoke Alarms: Feels safe in home. Smoke alarms in place.  Living environment; residence and Firearm Safety: Lives with husband in single family home. States they live in a two story but all living area is on first floor.  Seat Belt Safety/Bike Helmet: Wears seat belt.     Objective:     Vitals: BP 112/72 (BP Location: Left Arm, Patient Position: Sitting, Cuff Size: Normal)   Pulse (!) 57   Temp 97.6 F (36.4 C) (Oral)   Resp 16   Ht 5\' 2"  (1.575 m)   Wt 140 lb (63.5 kg)   SpO2 98%   BMI 25.61 kg/m   Body mass index is 25.61 kg/m.   Tobacco History  Smoking Status  . Never Smoker  Smokeless Tobacco  . Never Used     Counseling given: Not Answered   Past Medical History:  Diagnosis Date  . Arthritis   . Cancer (HCC)    hx of skin cancer   . Colon polyps    adenomatous  . Complication of anesthesia    " pt stated they gave me too much - kept having to be reminded to breathe   . Constipation    opiod induced associated with back pain  . Depression    hx of   . Diverticulosis   . DIVERTICULOSIS, COLON 01/13/2009  . Headache    history of migraines  . Heart murmur    benign, dagnosed around age 2,prophylactic antibiotic  . Low back pain   . Lumbar spondylolysis   . Osteopenia   . Pneumonia    hx of as a baby   . PVC's (premature ventricular contractions)   . Tubular adenoma of colon    Past Surgical History:  Procedure Laterality Date  . ABDOMINAL HERNIA REPAIR Right    incisional  . ABDOMINAL  HYSTERECTOMY     with ovaries  . BACK SURGERY  2009   L4-L5 with rods  . BLADDER SUSPENSION N/A 04/09/2017   Procedure: TRANSVAGINAL TAPE (TVT) PROCEDURE;  Surgeon: Bobbye Charleston, MD;  Location: [facility];  Service: Gynecology;  Laterality: N/A;  . CATARACT EXTRACTION, BILATERAL    . cataract left lens replacement     2016  . COLONOSCOPY    . CYSTOCELE REPAIR N/A 04/09/2017   Procedure: ANTERIOR REPAIR (CYSTOCELE);  Surgeon: Bobbye Charleston, MD;  Location: [facility];  Service: Gynecology;  Laterality: N/A;  . CYSTOSCOPY N/A 04/09/2017   Procedure: CYSTOSCOPY;  Surgeon: Bobbye Charleston, MD;  Location: [facility];  Service: Gynecology;  Laterality: N/A;  . EYE MUSCLE SURGERY    . GLAUCOMA SURGERY Left 05/12/14  . incisional ab  05/28/11  . JOINT REPLACEMENT     right hip replacement   . LUMBAR FUSION     L4-L5  . PARTIAL KNEE ARTHROPLASTY Right   . PARTIAL KNEE ARTHROPLASTY Left   . perforated deverticular abscess/laporotomy and colostomy     depression after this  . stribismus eye surgery Right   . take down of colostomy    .  tonsillectomly    . TONSILLECTOMY    . TOTAL HIP ARTHROPLASTY Right   . TOTAL KNEE REVISION Left 11/24/2015   Procedure: Conversion from left lateral unicompartmental knee arthroplasty to left total knee arthroplasty;  Surgeon: Mcarthur Rossetti, MD;  Location: [facility];  Service: Orthopedics;  Laterality: Left;   Family History  Problem Relation Age of Onset  . Alzheimer's disease Father   . Heart disease Father        mitral valve replaced. unknown reason  . Breast cancer Mother        72  . Colon polyps Mother   . Diabetes Mother   . Heart disease Mother 55       CHF, MI 46  . Heart attack Mother        x 2  . Prostate cancer Brother    History  Sexual Activity  . Sexual activity: Yes  . Birth control/ protection: Post-menopausal    Outpatient Encounter Prescriptions as of 06/30/2017  Medication Sig  . AMBULATORY NON FORMULARY MEDICATION  Evening Primerose Oil 1300 mg take 1 by mouth twice a day  . aspirin-acetaminophen-caffeine (EXCEDRIN EXTRA STRENGTH) 250-250-65 MG per tablet Take 2 tablets by mouth every 6 (six) hours as needed for headache. Reported on 11/03/2015  . carboxymethylcellulose (REFRESH PLUS) 0.5 % SOLN Place 1 drop into both eyes daily as needed (Dry eyes.).   Marland Kitchen Cholecalciferol (VITAMIN D) 1000 UNITS capsule Take 2,000 Units by mouth every evening.   . Ciclopirox 1 % shampoo Apply 1 each topically every Friday.   . cycloSPORINE (RESTASIS) 0.05 % ophthalmic emulsion Place 1 drop into both eyes 2 (two) times daily.   . DULoxetine (CYMBALTA) 60 MG capsule Take 1 capsule (60 mg total) by mouth daily.  Marland Kitchen estradiol (VIVELLE-DOT) 0.05 MG/24HR Place 1 patch onto the skin 2 (two) times a week. Reported on 11/03/2015 on thursdays and Sundays  . fluticasone (FLONASE) 50 MCG/ACT nasal spray SHAKE LIQUID AND USE 2 SPRAYS IN EACH NOSTRIL DAILY  . Lutein 20 MG TABS Take 1 tablet by mouth every evening.   Marland Kitchen LYRICA 25 MG capsule TAKE 1 CAPSULE IN THE MORNING AND TAKE 2 TABLETS IN THE EVENING  . Multiple Vitamins-Minerals (ICAPS AREDS 2) CAPS Take 1 capsule by mouth 2 (two) times daily.   . Multiple Vitamins-Minerals (MULTIVITAMIN PO) Take 1 tablet by mouth every morning.   . NONFORMULARY OR COMPOUNDED ITEM Apply 1-2 g topically daily. [employer] Nail lacquer: Fluconazole 2%, Terbinafine 1%, DMSO  . Olopatadine HCl (PATADAY) 0.2 % SOLN Place 1 drop into both eyes daily as needed (dry/itchy eyes.).   Marland Kitchen omega-3 acid ethyl esters (LOVAZA) 1 G capsule Take 1 g by mouth 2 (two) times daily.   . polyethylene glycol powder (GLYCOLAX/MIRALAX) powder Take 17 g by mouth every morning.   . Probiotic Product (ALIGN) 4 MG CAPS Take 1 capsule by mouth every morning.   Marland Kitchen tiZANidine (ZANAFLEX) 2 MG tablet TAKE 1 TABLET (2 MG TOTAL) BY MOUTH AT BEDTIME.  Marland Kitchen zolpidem (AMBIEN) 5 MG tablet TAKE 1 TABLET AT BEDTIME AS NEEDED FOR SLEEP  . neomycin-polymyxin  b-dexamethasone (MAXITROL) 3.5-10000-0.1 OINT as needed.  . [DISCONTINUED] azithromycin (AZASITE) 1 % ophthalmic solution Place 1 drop into both eyes every 30 (thirty) days. One week per month  . [DISCONTINUED] oxyCODONE-acetaminophen (ROXICET) 5-325 MG tablet Take 1 tablet by mouth every 4 (four) hours as needed for severe pain. (Patient not taking: Reported on 06/30/2017)   No facility-administered encounter medications  on file as of 06/30/2017.     Activities of Daily Living In your present state of health, do you have any difficulty performing the following activities: 06/30/2017 04/03/2017  Hearing? N N  Vision? N N  Difficulty concentrating or making decisions? N N  Walking or climbing stairs? N N  Dressing or bathing? N N  Doing errands, shopping? N N  Preparing Food and eating ? N -  Using the Toilet? N -  In the past six months, have you accidently leaked urine? Y -  Comment Rarely when she holds it for too long. -  Do you have problems with loss of bowel control? N -  Managing your Medications? N -  Managing your Finances? N -  Housekeeping or managing your Housekeeping? N -  Some recent data might be hidden    Patient Care Team: Marin Olp, MD as PCP - General (Family Medicine) Monna Fam, MD as Consulting Physician (Ophthalmology) Zadie Rhine Clent Demark, MD as Consulting Physician (Ophthalmology) Bobbye Charleston, MD as Consulting Physician (Obstetrics and Gynecology) [name], MD as Consulting Physician (Cardiology) Pyrtle, Lajuan Lines, MD as Consulting Physician (Gastroenterology) Jari Pigg, MD as Consulting Physician (Dermatology) Mcarthur Rossetti, MD as Consulting Physician (Orthopedic Surgery) Trula Slade, DPM as Consulting Physician (Podiatry)    Assessment:    Physical assessment deferred to PCP.  Exercise Activities and Dietary recommendations Current Exercise Habits: Structured exercise class, Type of exercise: Other - see  comments;strength training/weights;stretching (Aerobics energy), Time (Minutes): 45, Frequency (Times/Week): 3, Weekly Exercise (Minutes/Week): 135, Intensity: Moderate, Exercise limited by: None identified   Diet (meal preparation, eat out, water intake, caffeinated beverages, dairy products, fruits and vegetables): 2-3 meals/day. Most meals prepared at home. 4-8 glasses water/day. Also drinks 2 glasses herbal tea. Avoiding caffeine. Pt states that she has been trying to lose weight unsuccessfull. Pt will look at MyFitnessPal to help track goals.   Goals    . Be more active, build up strength, lose 5 lbs.      Fall Risk Fall Risk  06/30/2017 06/04/2016 05/30/2015 01/24/2014 01/24/2014  Falls in the past year? No No No No No   Depression Screen PHQ 2/9 Scores 06/30/2017 06/04/2016 05/30/2015 01/24/2014  PHQ - 2 Score 0 0 0 0  PHQ- 9 Score 0 - - -     Cognitive Function MMSE - Mini Mental State Exam 06/30/2017  Orientation to time 5  Orientation to Place 5  Registration 3  Attention/ Calculation 5  Recall 3  Language- name 2 objects 2  Language- repeat 1  Language- follow 3 step command 3  Language- read & follow direction 1  Write a sentence 1  Copy design 1  Total score 30        Immunization History  Administered Date(s) Administered  . H1N1 09/13/2008  . Influenza Whole 06/24/2008, 06/26/2009, 08/01/2010  . Influenza, High Dose Seasonal PF 07/10/2016, 06/30/2017  . Influenza, Seasonal, Injecte, Preservative Fre 07/06/2013  . Influenza,inj,Quad PF,6+ Mos 07/07/2014, 07/11/2015  . Pneumococcal Conjugate-13 05/30/2015  . Pneumococcal Polysaccharide-23 12/30/2005  . Td 08/01/2010  . Zoster 10/08/2007   Screening Tests Health Maintenance  Topic Date Due  . COLONOSCOPY  05/27/2017  . TETANUS/TDAP  08/01/2020  . INFLUENZA VACCINE  Completed  . DEXA SCAN  Completed  . PNA vac Low Risk Adult  Completed      Plan:   Follow up with PCP as directed.  I have personally  reviewed and noted the following in  the patient's chart:   . Medical and social history . Use of alcohol, tobacco or illicit drugs  . Current medications and supplements . Functional ability and status . Nutritional status . Physical activity . Advanced directives . List of other physicians . Vitals . Screenings to include cognitive, depression, and falls . Referrals and appointments  In addition, I have reviewed and discussed with patient certain preventive protocols, quality metrics, and best practice recommendations. A written personalized care plan for preventive services as well as general preventive health recommendations were provided to patient.     Ree Edman, RN  06/30/2017

## 2017-06-27 NOTE — Progress Notes (Signed)
PCP notes:   Health maintenance: Flu: Will receive today.  Colonoscopy: Appt scheduled 07/02/17 with Dr Raquel James.  Abnormal screenings: none.   Patient concerns: Chronic neck pain. States PT therapy and message therapy has helped.    Nurse concerns: None.   Next PCP appt: 06/30/2017 9:30

## 2017-06-27 NOTE — Progress Notes (Signed)
Pre visit review using our clinic review tool, if applicable. No additional management support is needed unless otherwise documented below in the visit note. 

## 2017-06-30 ENCOUNTER — Ambulatory Visit (INDEPENDENT_AMBULATORY_CARE_PROVIDER_SITE_OTHER): Payer: Medicare Other | Admitting: *Deleted

## 2017-06-30 ENCOUNTER — Encounter: Payer: Self-pay | Admitting: *Deleted

## 2017-06-30 ENCOUNTER — Ambulatory Visit (INDEPENDENT_AMBULATORY_CARE_PROVIDER_SITE_OTHER): Payer: Medicare Other | Admitting: Family Medicine

## 2017-06-30 ENCOUNTER — Encounter: Payer: Self-pay | Admitting: Family Medicine

## 2017-06-30 VITALS — BP 112/72 | HR 57 | Temp 97.6°F | Resp 16 | Ht 62.0 in | Wt 140.0 lb

## 2017-06-30 DIAGNOSIS — M47812 Spondylosis without myelopathy or radiculopathy, cervical region: Secondary | ICD-10-CM | POA: Diagnosis not present

## 2017-06-30 DIAGNOSIS — M48061 Spinal stenosis, lumbar region without neurogenic claudication: Secondary | ICD-10-CM

## 2017-06-30 DIAGNOSIS — I493 Ventricular premature depolarization: Secondary | ICD-10-CM

## 2017-06-30 DIAGNOSIS — G4700 Insomnia, unspecified: Secondary | ICD-10-CM

## 2017-06-30 DIAGNOSIS — Z23 Encounter for immunization: Secondary | ICD-10-CM

## 2017-06-30 DIAGNOSIS — Z0000 Encounter for general adult medical examination without abnormal findings: Secondary | ICD-10-CM

## 2017-06-30 DIAGNOSIS — D696 Thrombocytopenia, unspecified: Secondary | ICD-10-CM

## 2017-06-30 LAB — COMPREHENSIVE METABOLIC PANEL
ALK PHOS: 59 U/L (ref 39–117)
ALT: 12 U/L (ref 0–35)
AST: 13 U/L (ref 0–37)
Albumin: 4.5 g/dL (ref 3.5–5.2)
BILIRUBIN TOTAL: 1.2 mg/dL (ref 0.2–1.2)
BUN: 15 mg/dL (ref 6–23)
CO2: 30 meq/L (ref 19–32)
CREATININE: 0.75 mg/dL (ref 0.40–1.20)
Calcium: 9.6 mg/dL (ref 8.4–10.5)
Chloride: 101 mEq/L (ref 96–112)
GFR: 79.52 mL/min (ref >60.00)
GLUCOSE: 85 mg/dL (ref 70–99)
Potassium: 4.5 mEq/L (ref 3.5–5.1)
Sodium: 139 mEq/L (ref 135–145)
Total Protein: 6.7 g/dL (ref 6.0–8.3)

## 2017-06-30 LAB — CBC WITH DIFFERENTIAL/PLATELET
BASOS ABS: 0 10*3/uL (ref 0.0–0.1)
Basophils Relative: 1 % (ref 0.0–3.0)
EOS ABS: 0.1 10*3/uL (ref 0.0–0.7)
Eosinophils Relative: 2.4 % (ref 0.0–5.0)
HEMATOCRIT: 44.7 % (ref 36.0–46.0)
Hemoglobin: 14.8 g/dL (ref 12.0–15.0)
LYMPHS PCT: 32.1 % (ref 12.0–46.0)
Lymphs Abs: 1.4 10*3/uL (ref 0.7–4.0)
MCHC: 33.1 g/dL (ref 30.0–36.0)
MCV: 93.3 fl (ref 78.0–100.0)
Monocytes Absolute: 0.4 10*3/uL (ref 0.1–1.0)
Monocytes Relative: 9 % (ref 3.0–12.0)
NEUTROS ABS: 2.5 10*3/uL (ref 1.4–7.7)
Neutrophils Relative %: 55.5 % (ref 43.0–77.0)
PLATELETS: 155 10*3/uL (ref 150.0–400.0)
RBC: 4.79 Mil/uL (ref 3.87–5.11)
RDW: 13.7 % (ref 11.5–15.5)
WBC: 4.5 10*3/uL (ref 4.0–10.5)

## 2017-06-30 MED ORDER — DICLOFENAC SODIUM 75 MG PO TBEC: 75.0000 mg | DELAYED_RELEASE_TABLET | Freq: Two times a day (BID) | ORAL | 0 refills | Status: DC | PRN

## 2017-06-30 NOTE — Progress Notes (Signed)
I have reviewed and agree with note, evaluation, plan.   Anna Beaird, MD  

## 2017-06-30 NOTE — Assessment & Plan Note (Signed)
S: Patient with long term issues with low back pain related to spinal stenosis. Back in march- we opted to keep 25 in AM and lyrica 50 in PM.   We also referred her to physical therapy on 09/05/16 for some neck pain on left side. That was helpful at the time. 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 Heat and massage helps. excedrin helps but other nsaids dont in her experience.   Tizanidine helps at night- taking nightly. Worst her pain gets is up to 6-7/10 but usually with a trigger. Most of the time 2-3/10 A/P: will trial diclofenac for her neck if pain gets up to 6/10. Reduce variables by not changing lyrica though we have discussed reducing this in the past.

## 2017-06-30 NOTE — Progress Notes (Signed)
Subjective:  Tanya Leon is a 76 y.o. year old very pleasant female patient who presents for/with See problem oriented charting ROS- neck pain, no radicular symptoms. No fever or chills. No chest pain. Does have some palpitations.    Past Medical History-  Patient Active Problem List   Diagnosis Date Noted  . PVC's (premature ventricular contractions) 03/26/2017    Priority: Medium  . Basal cell carcinoma 07/07/2014    Priority: Medium  . Depression 06/01/2014    Priority: Medium  . SPINAL STENOSIS, LUMBAR 01/25/2008    Priority: Medium  . INSOMNIA, CHRONIC 07/22/2007    Priority: Medium  . Thrombocytopenia ([place]) 05/03/2015    Priority: Low  . 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 Glaucoma 06/01/2014    Priority: Low  . 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 COLONIC POLYPS 01/13/2009    Priority: Low  . 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 ALLERGIC RHINITIS 05/21/2007    Priority: Low  . Osteopenia 02/26/2007    Priority: Low  . Cervical arthritis 06/30/2017  . 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 AMBULATORY NON FORMULARY MEDICATION Evening Primerose Oil 1300 mg take 1 by mouth twice a day    . aspirin-acetaminophen-caffeine (EXCEDRIN EXTRA STRENGTH) 250-250-65 MG per tablet Take 2 tablets by mouth every 6 (six) hours as needed for headache. Reported on 11/03/2015    . carboxymethylcellulose (REFRESH PLUS) 0.5 % SOLN Place 1 drop into both eyes daily as needed (Dry eyes.).     Marland Kitchen Cholecalciferol (VITAMIN D) 1000 UNITS capsule Take 2,000 Units by mouth every evening.     . Ciclopirox 1 % shampoo Apply 1 each topically every Friday.     . cycloSPORINE (RESTASIS) 0.05 % ophthalmic emulsion Place 1 drop into both eyes 2  (two) times daily.     . DULoxetine (CYMBALTA) 60 MG capsule Take 1 capsule (60 mg total) by mouth daily. 90 capsule 3  . estradiol (VIVELLE-DOT) 0.05 MG/24HR Place 1 patch onto the skin 2 (two) times a week. Reported on 11/03/2015 on thursdays and Sundays    . fluticasone (FLONASE) 50 MCG/ACT nasal spray SHAKE LIQUID AND USE 2 SPRAYS IN EACH NOSTRIL DAILY 48 g 3  . Lutein 20 MG TABS Take 1 tablet by mouth every evening.     Marland Kitchen LYRICA 25 MG capsule TAKE 1 CAPSULE IN THE MORNING AND TAKE 2 TABLETS IN THE EVENING 270 capsule 1  . Multiple Vitamins-Minerals (ICAPS AREDS 2) CAPS Take 1 capsule by mouth 2 (two) times daily.     . Multiple Vitamins-Minerals (MULTIVITAMIN PO) Take 1 tablet by mouth every morning.     . neomycin-polymyxin b-dexamethasone (MAXITROL) 3.5-10000-0.1 OINT as needed.  0  . NONFORMULARY OR COMPOUNDED ITEM Apply 1-2 g topically daily. [employer] Nail lacquer: Fluconazole 2%, Terbinafine 1%, DMSO 120 each 11  . Olopatadine HCl (PATADAY) 0.2 % SOLN Place 1 drop into both eyes daily as needed (dry/itchy eyes.).     Marland Kitchen omega-3 acid ethyl esters (LOVAZA) 1 G capsule Take 1 g by mouth 2 (two) times daily.     . polyethylene glycol powder (GLYCOLAX/MIRALAX) powder Take 17 g by mouth every morning.     . Probiotic Product (ALIGN) 4 MG CAPS Take  1 capsule by mouth every morning.     Marland Kitchen tiZANidine (ZANAFLEX) 2 MG tablet TAKE 1 TABLET (2 MG TOTAL) BY MOUTH AT BEDTIME. 90 tablet 1  . zolpidem (AMBIEN) 5 MG tablet TAKE 1 TABLET AT BEDTIME AS NEEDED FOR SLEEP 30 tablet 5   Objective: BP 112/72   Pulse (!) 57   Temp 97.6 F (36.4 C)   Wt 137 lb 3.2 oz (62.2 kg)   SpO2 98%   BMI 25.09 kg/m  Gen: NAD, resting comfortably TM normal, oropharynx normal. Good dentition.  CV: RRR no murmurs rubs or gallops Lungs: CTAB no crackles, wheeze, rhonchi Abdomen: soft/nontender/nondistended/normal bowel sounds. No rebound or guarding.  Ext: no edema Skin: warm, dry Neuro: normal strength in arms Neck:  normal ROM  Assessment/Plan:  Appt about Colonoscopy 07/02/17 with Dr. Hilarie Fredrickson  Wants shingrix- going to hold off for now given flu shot today and recent illness recovering from.   PVC's (premature ventricular contractions) S:  patient presented with palpitations back in June. PVCs on EKG. Low risk patient other than age for ischemic cause. She preferred cardiology evaluation and he was referred. Echo showed moderate MR and TR, mild pulmonary hypertension A/P: sees Dr. Gwenlyn Found tomorrow. Plan 1 year echo follow up. Cut down on caffeine- noting less palpitations .   SPINAL STENOSIS, LUMBAR See cervical arthritis section. No lyrica change today due to adjusting meds for arthritis.   Cervical arthritis S: Patient with long term issues with low back pain related to spinal stenosis. Back in march- we opted to keep 25 in AM and lyrica 50 in PM.   We also referred her to physical therapy on 09/05/16 for some neck pain on left side. That was helpful at the time. 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 Heat and massage helps. excedrin helps but other nsaids dont in her experience.   Tizanidine helps at night- taking nightly. Worst her pain gets is up to 6-7/10 but usually with a trigger. Most of the time 2-3/10 A/P: will trial diclofenac for her neck if pain gets up to 6/10. Reduce variables by not changing lyrica though we have discussed reducing this in the past.   INSOMNIA, CHRONIC S: chronic insomnia. Doing well on ambien. No driving issues. No abnormal dreams A/P: refill rx- doing well. Limit to 5mg  for sex and age  Thrombocytopenia S: mild and intermittent. Rest of cell lines have been ok.  A/P: update cbc to monitor   Future Appointments Date Time Provider [place]  07/01/2017 9:00 AM [name], MD [facility]  07/02/2017 9:00 AM Pyrtle, Lajuan Lines, MD [facility]  07/21/2017 2:15 PM Trula Slade, DPM  [facility]  07/01/2018 8:00 AM Stephanie Acre, RN [facility] None  07/01/2018 9:30 AM Marin Olp, MD [facility] None   6 months  Orders Placed This Encounter  Procedures  . CBC with Differential/Platelet  . Comprehensive metabolic panel    [place]    Meds ordered this encounter  Medications  . diclofenac (VOLTAREN) 75 MG EC tablet    Sig: Take 1 tablet (75 mg total) by mouth 2 (two) times daily as needed for moderate pain.    Dispense:  30 tablet    Refill:  0    Return precautions advised.  Garret Reddish, MD

## 2017-06-30 NOTE — Patient Instructions (Addendum)
17 Tanya Leon ,  Look at MyFitnessPal app on your smart phone.   Bring a copy of your living will and/or healthcare power of attorney to your next office visit.  Thank you for taking time to come for your Medicare Wellness Visit. I appreciate your ongoing commitment to your health goals. Please review the following plan we discussed and let me know if I can assist you in the future.   These are the goals we discussed: Goals    . Be more active, build up strength, lose 5 lbs.       This is a list of the screening recommended for you and due dates:  Health Maintenance  Topic Date Due  . Flu Shot  04/30/2017  . Colon Cancer Screening  05/27/2017  . Tetanus Vaccine  08/01/2020  . DEXA scan (bone density measurement)  Completed  . Pneumonia vaccines  Completed   Preventive Care for Adults  A healthy lifestyle and preventive care can promote health and wellness. Preventive health guidelines for adults include the following key practices.  . A routine yearly physical is a good way to check with your health care provider about your health and preventive screening. It is a chance to share any concerns and updates on your health and to receive a thorough exam.  . Visit your dentist for a routine exam and preventive care every 6 months. Brush your teeth twice a day and floss once a day. Good oral hygiene prevents tooth decay and gum disease.  . The frequency of eye exams is based on your age, health, family medical history, use  of contact lenses, and other factors. Follow your health care provider's ecommendations for frequency of eye exams.  . Eat a healthy diet. Foods like vegetables, fruits, whole grains, low-fat dairy products, and lean protein foods contain the nutrients you need without too many calories. Decrease your intake of foods high in solid fats, added sugars, and salt. Eat the right amount of calories for you. Get information about a proper diet from your health care provider,  if necessary.  . Regular physical exercise is one of the most important things you can do for your health. Most adults should get at least 150 minutes of moderate-intensity exercise (any activity that increases your heart rate and causes you to sweat) each week. In addition, most adults need muscle-strengthening exercises on 2 or more days a week.  Silver Sneakers may be a benefit available to you. To determine eligibility, you may visit the website: www.silversneakers.com or contact program at 667-376-2977 Mon-Fri between 8AM-8PM.   . Maintain a healthy weight. The body mass index (BMI) is a screening tool to identify possible weight problems. It provides an estimate of body fat based on height and weight. Your health care provider can find your BMI and can help you achieve or maintain a healthy weight.   For adults 20 years and older: ? A BMI below 18.5 is considered underweight. ? A BMI of 18.5 to 24.9 is normal. ? A BMI of 25 to 29.9 is considered overweight. ? A BMI of 30 and above is considered obese.   . Maintain normal blood lipids and cholesterol levels by exercising and minimizing your intake of saturated fat. Eat a balanced diet with plenty of fruit and vegetables. Blood tests for lipids and cholesterol should begin at age 32 and be repeated every 5 years. If your lipid or cholesterol levels are high, you are over 50, or you are at  high risk for heart disease, you may need your cholesterol levels checked more frequently. Ongoing high lipid and cholesterol levels should be treated with medicines if diet and exercise are not working.  . If you smoke, find out from your health care provider how to quit. If you do not use tobacco, please do not start.  . If you choose to drink alcohol, please do not consume more than 2 drinks per day. One drink is considered to be 12 ounces (355 mL) of beer, 5 ounces (148 mL) of wine, or 1.5 ounces (44 mL) of liquor.  . If you are 11-29 years old, ask  your health care provider if you should take aspirin to prevent strokes.  . Use sunscreen. Apply sunscreen liberally and repeatedly throughout the day. You should seek shade when your shadow is shorter than you. Protect yourself by wearing long sleeves, pants, a wide-brimmed hat, and sunglasses year round, whenever you are outdoors.  . Once a month, do a whole body skin exam, using a mirror to look at the skin on your back. Tell your health care provider of new moles, moles that have irregular borders, moles that are larger than a pencil eraser, or moles that have changed in shape or color.

## 2017-06-30 NOTE — Assessment & Plan Note (Signed)
S: mild and intermittent. Rest of cell lines have been ok.  A/P: update cbc to monitor

## 2017-06-30 NOTE — Assessment & Plan Note (Signed)
See cervical arthritis section. No lyrica change today due to adjusting meds for arthritis.

## 2017-06-30 NOTE — Patient Instructions (Signed)
Please stop by lab before you go  Trial voltaren/diclofenac sparingly if pain gets up to 6/10 in the neck

## 2017-06-30 NOTE — Assessment & Plan Note (Signed)
S:  patient presented with palpitations back in June. PVCs on EKG. Low risk patient other than age for ischemic cause. She preferred cardiology evaluation and he was referred. Echo showed moderate MR and TR, mild pulmonary hypertension A/P: sees Dr. Gwenlyn Found tomorrow. Plan 1 year echo follow up. Cut down on caffeine- noting less palpitations .

## 2017-06-30 NOTE — Assessment & Plan Note (Signed)
S: chronic insomnia. Doing well on ambien. No driving issues. No abnormal dreams A/P: refill rx- doing well. Limit to 5mg  for sex and age

## 2017-07-01 ENCOUNTER — Ambulatory Visit (INDEPENDENT_AMBULATORY_CARE_PROVIDER_SITE_OTHER): Payer: Medicare Other | Admitting: Cardiovascular Disease

## 2017-07-01 ENCOUNTER — Encounter: Payer: Self-pay | Admitting: Cardiovascular Disease

## 2017-07-01 VITALS — BP 112/80 | HR 56 | Ht 62.0 in | Wt 139.0 lb

## 2017-07-01 DIAGNOSIS — I272 Pulmonary hypertension, unspecified: Secondary | ICD-10-CM | POA: Insufficient documentation

## 2017-07-01 DIAGNOSIS — I493 Ventricular premature depolarization: Secondary | ICD-10-CM | POA: Diagnosis not present

## 2017-07-01 NOTE — Assessment & Plan Note (Signed)
Ms. Rondeau has a PA pressure of 40, moderate MR and TR, moderate left atrial enlargement, severe concentric left ventricle hypertrophy with preserved LV function and grade 1 diastolic dysfunction. She is fairly symptomatic except for some occasional PVCs. She does have mild dyspnea on severe exertion. We'll continue to follow this by 2-D echocardiography.

## 2017-07-01 NOTE — Patient Instructions (Signed)
Medication Instructions: Your physician recommends that you continue on your current medications as directed. Please refer to the Current Medication list given to you today.   Testing/Procedures: Your physician has requested that you have an echocardiogram. Echocardiography is a painless test that uses sound waves to create images of your heart. It provides your doctor with information about the size and shape of your heart and how well your heart's chambers and valves are working. This procedure takes approximately one hour. There are no restrictions for this procedure. (In 1 year)   Follow-Up: Your physician wants you to follow-up in: 1 year with Dr. Gwenlyn Found after echo. You will receive a reminder letter in the mail two months in advance. If you don't receive a letter, please call our office to schedule the follow-up appointment.  If you need a refill on your cardiac medications before your next appointment, please call your pharmacy.

## 2017-07-01 NOTE — Progress Notes (Signed)
Tanya Leon returns in follow-up of her outpatient tests done in evaluation of PVCs. Monitor failed to show frequent PVCs however on exam she clearly has these although she is the most part asymptomatic from them. A 2-D echo did show severe concentric LVH, moderate MR and TR, moderate left atrial enlargement and mild pulmonary hypertension. We'll continue to follow her 2-D echo on an annual basis.  [name], M.D., [facility], [place] [address] [place], [place]  [address]  [phone] 07/01/2017 9:34 AM

## 2017-07-01 NOTE — Assessment & Plan Note (Signed)
Tanya Leon returns today for follow-up of PVCs. She wore a event monitor several months ago for 2 weeks that did not demonstrate PVCs although she clearly has them on exam and is fairly symptomatic from these.

## 2017-07-02 ENCOUNTER — Encounter: Payer: Self-pay | Admitting: Internal Medicine

## 2017-07-02 ENCOUNTER — Telehealth: Payer: Self-pay | Admitting: Internal Medicine

## 2017-07-02 ENCOUNTER — Ambulatory Visit (INDEPENDENT_AMBULATORY_CARE_PROVIDER_SITE_OTHER): Payer: Medicare Other | Admitting: Internal Medicine

## 2017-07-02 VITALS — BP 126/80 | HR 60 | Ht 61.0 in | Wt 141.0 lb

## 2017-07-02 DIAGNOSIS — Z9049 Acquired absence of other specified parts of digestive tract: Secondary | ICD-10-CM

## 2017-07-02 DIAGNOSIS — Z8719 Personal history of other diseases of the digestive system: Secondary | ICD-10-CM | POA: Diagnosis not present

## 2017-07-02 DIAGNOSIS — Z86010 Personal history of colonic polyps: Secondary | ICD-10-CM | POA: Diagnosis not present

## 2017-07-02 DIAGNOSIS — Z9889 Other specified postprocedural states: Secondary | ICD-10-CM | POA: Diagnosis not present

## 2017-07-02 MED ORDER — SUPREP BOWEL PREP KIT 17.5-3.13-1.6 GM/177ML PO SOLN: 1.0000 | ORAL | 0 refills | Status: DC

## 2017-07-02 NOTE — Progress Notes (Signed)
Patient ID: Tanya Leon, female   DOB: July 03, 1941, 76 y.o.   MRN: [id number] HPI: Tanya Leon is a 76 year old female with a past medical history of multiple adenomatous colon polyps, colonic diverticulosis with history of diverticulitis and sigmoid resection in 2007, history of cystocele with recent repair, possible rectocele, osteopenia and history of migraines who is seen to establish care and to discuss surveillance colonoscopy. She is here today with her husband, who is also a patient of mine.  She was previously seen by Dr. Henrene Pastor.  She reports receiving a letter that she is due for surveillance colonoscopy. I have reviewed her records. Her last colonoscopy was performed on 05/27/2014. This revealed 5 polyps ranging in size from 5-9 mm primarily in the right colon. These were removed with cold snare and found to be tubular adenomas.  Her sigmoid anastomosis was seen and intact. She had internal hemorrhoids on retroflexion.  She reports that overall she has been feeling and doing well. She uses MiraLAX 17 g on a daily basis since her colon surgery. Bowel movements are regular without blood in her stool or melena. No abdominal pain. She's also on a daily probiotic. She denies upper GI and hepatobiliary complaint.  Past Medical History:  Diagnosis Date  . Arthritis   . Cancer (HCC)    hx of skin cancer   . Colon polyps    adenomatous  . Complication of anesthesia    " pt stated they gave me too much - kept having to be reminded to breathe   . Constipation    opiod induced associated with back pain  . Depression    hx of   . Diverticulosis   . DIVERTICULOSIS, COLON 01/13/2009  . Headache    history of migraines  . Heart murmur    benign, dagnosed around age 63,prophylactic antibiotic  . Low back pain   . Lumbar spondylolysis   . Osteopenia   . Pneumonia    hx of as a baby   . PVC's (premature ventricular contractions)   . Rectocele   . Tubular adenoma of colon     Past  Surgical History:  Procedure Laterality Date  . ABDOMINAL HERNIA REPAIR Right    incisional  . ABDOMINAL HYSTERECTOMY     with ovaries  . BACK SURGERY  2009   L4-L5 with rods  . BLADDER SUSPENSION N/A 04/09/2017   Procedure: TRANSVAGINAL TAPE (TVT) PROCEDURE;  Surgeon: Bobbye Charleston, MD;  Location: [facility];  Service: Gynecology;  Laterality: N/A;  . CATARACT EXTRACTION, BILATERAL    . cataract left lens replacement     2016  . COLONOSCOPY    . CYSTOCELE REPAIR N/A 04/09/2017   Procedure: ANTERIOR REPAIR (CYSTOCELE);  Surgeon: Bobbye Charleston, MD;  Location: [facility];  Service: Gynecology;  Laterality: N/A;  . CYSTOSCOPY N/A 04/09/2017   Procedure: CYSTOSCOPY;  Surgeon: Bobbye Charleston, MD;  Location: [facility];  Service: Gynecology;  Laterality: N/A;  . EYE MUSCLE SURGERY    . GLAUCOMA SURGERY Left 05/12/14  . incisional ab  05/28/11  . JOINT REPLACEMENT     right hip replacement   . LUMBAR FUSION     L4-L5  . PARTIAL KNEE ARTHROPLASTY Right   . PARTIAL KNEE ARTHROPLASTY Left   . perforated deverticular abscess/laporotomy and colostomy     depression after this  . stribismus eye surgery Right   . take down of colostomy    . tonsillectomly    . TONSILLECTOMY    .  TOTAL HIP ARTHROPLASTY Right   . TOTAL KNEE REVISION Left 11/24/2015   Procedure: Conversion from left lateral unicompartmental knee arthroplasty to left total knee arthroplasty;  Surgeon: Mcarthur Rossetti, MD;  Location: [facility];  Service: Orthopedics;  Laterality: Left;    Outpatient Medications Prior to Visit  Medication Sig Dispense Refill  . AMBULATORY NON FORMULARY MEDICATION Evening Primerose Oil 1300 mg take 1 by mouth twice a day    . aspirin-acetaminophen-caffeine (EXCEDRIN EXTRA STRENGTH) 250-250-65 MG per tablet Take 2 tablets by mouth every 6 (six) hours as needed for headache. Reported on 11/03/2015    . carboxymethylcellulose (REFRESH PLUS) 0.5 % SOLN Place 1 drop into both eyes daily as needed (Dry  eyes.).     Marland Kitchen Cholecalciferol (VITAMIN D) 1000 UNITS capsule Take 2,000 Units by mouth every evening.     . Ciclopirox 1 % shampoo Apply 1 each topically every Friday.     . cycloSPORINE (RESTASIS) 0.05 % ophthalmic emulsion Place 1 drop into both eyes 2 (two) times daily.     . diclofenac (VOLTAREN) 75 MG EC tablet Take 1 tablet (75 mg total) by mouth 2 (two) times daily as needed for moderate pain. 30 tablet 0  . DULoxetine (CYMBALTA) 60 MG capsule Take 1 capsule (60 mg total) by mouth daily. 90 capsule 3  . estradiol (VIVELLE-DOT) 0.05 MG/24HR Place 1 patch onto the skin 2 (two) times a week. Reported on 11/03/2015 on thursdays and "Sundays    . fluticasone (FLONASE) 50 MCG/ACT nasal spray SHAKE LIQUID AND USE 2 SPRAYS IN EACH NOSTRIL DAILY 48 g 3  . Lutein 20 MG TABS Take 1 tablet by mouth every evening.     . LYRICA 25 MG capsule TAKE 1 CAPSULE IN THE MORNING AND TAKE 2 TABLETS IN THE EVENING 270 capsule 1  . Multiple Vitamins-Minerals (ICAPS AREDS 2) CAPS Take 1 capsule by mouth 2 (two) times daily.     . Multiple Vitamins-Minerals (MULTIVITAMIN PO) Take 1 tablet by mouth every morning.     . neomycin-polymyxin b-dexamethasone (MAXITROL) 3.5-10000-0.1 OINT as needed.  0  . NONFORMULARY OR COMPOUNDED ITEM Apply 1-2 g topically daily. [employer] Nail lacquer: Fluconazole 2%, Terbinafine 1%, DMSO 120 each 11  . olopatadine (PATANOL) 0.1 % ophthalmic solution Place 1 drop into both eyes 2 (two) times daily.    . omega-3 acid ethyl esters (LOVAZA) 1 G capsule Take 1 g by mouth 2 (two) times daily.     . polyethylene glycol powder (GLYCOLAX/MIRALAX) powder Take 17 g by mouth every morning.     . Probiotic Product (ALIGN) 4 MG CAPS Take 1 capsule by mouth every morning.     . tiZANidine (ZANAFLEX) 2 MG tablet TAKE 1 TABLET (2 MG TOTAL) BY MOUTH AT BEDTIME. 90 tablet 1  . zolpidem (AMBIEN) 5 MG tablet TAKE 1 TABLET AT BEDTIME AS NEEDED FOR SLEEP 30 tablet 5   No facility-administered medications prior  to visit.     Allergies  Allergen Reactions  . Shellfish Allergy Swelling    Family History  Problem Relation Age of Onset  . Alzheimer's disease Father   . Heart disease Father        mitral valve replaced. unknown reason  . Breast cancer Mother        70"   . Colon polyps Mother   . Diabetes Mother   . Heart disease Mother 75       CHF, MI 26  . Heart attack Mother  x 2  . Prostate cancer Brother     Social History  Substance Use Topics  . Smoking status: Never Smoker  . Smokeless tobacco: Never Used  . Alcohol use 4.2 oz/week    7 Glasses of wine per week     Comment: 1 glass of wine daily     ROS: As per history of present illness, otherwise negative  BP 126/80 (BP Location: Right Arm, Patient Position: Sitting, Cuff Size: Normal)   Pulse 60 Comment: irregular  Ht 5\' 1"  (1.549 m)   Wt 141 lb (64 kg)   BMI 26.64 kg/m  Constitutional: Well-developed and well-nourished. No distress. HEENT: Normocephalic and atraumatic. Oropharynx is clear and moist. Conjunctivae are normal.  No scleral icterus. Neck: Neck supple. Trachea midline. Cardiovascular: Normal rate, regular rhythm and intact distal pulses. No M/R/G Pulmonary/chest: Effort normal and breath sounds normal. No wheezing, rales or rhonchi. Abdominal: Soft, nontender, nondistended. Bowel sounds active throughout. There are no masses palpable. No hepatosplenomegaly. Extremities: no clubbing, cyanosis, or edema Neurological: Alert and oriented to person place and time. Skin: Skin is warm and dry.  Psychiatric: Normal mood and affect. Behavior is normal.  RELEVANT LABS AND IMAGING: CBC    Component Value Date/Time   WBC 4.5 06/30/2017 1001   RBC 4.79 06/30/2017 1001   HGB 14.8 06/30/2017 1001   HGB 13.3 01/22/2016 0000   HCT 44.7 06/30/2017 1001   HCT 40.9 01/22/2016 0000   PLT 155.0 06/30/2017 1001   MCV 93.3 06/30/2017 1001   MCV 89 01/22/2016 0000   MCH 31.0 04/03/2017 1350   MCHC 33.1  06/30/2017 1001   RDW 13.7 06/30/2017 1001   RDW 14.8 01/22/2016 0000   LYMPHSABS 1.4 06/30/2017 1001   LYMPHSABS 1.7 01/22/2016 0000   MONOABS 0.4 06/30/2017 1001   EOSABS 0.1 06/30/2017 1001   EOSABS 0.1 01/22/2016 0000   BASOSABS 0.0 06/30/2017 1001   BASOSABS 0.0 01/22/2016 0000    CMP     Component Value Date/Time   NA 139 06/30/2017 1001   K 4.5 06/30/2017 1001   CL 101 06/30/2017 1001   CO2 30 06/30/2017 1001   GLUCOSE 85 06/30/2017 1001   BUN 15 06/30/2017 1001   CREATININE 0.75 06/30/2017 1001   CALCIUM 9.6 06/30/2017 1001   PROT 6.7 06/30/2017 1001   PROT 6.5 02/28/2016 1524   ALBUMIN 4.5 06/30/2017 1001   ALBUMIN 4.4 02/28/2016 1524   AST 13 06/30/2017 1001   ALT 12 06/30/2017 1001   ALKPHOS 59 06/30/2017 1001   BILITOT 1.2 06/30/2017 1001   BILITOT 0.3 02/28/2016 1524   GFRNONAA >60 11/25/2015 0424   GFRAA >60 11/25/2015 0424    ASSESSMENT/PLAN:  76 year old female with a past medical history of multiple adenomatous colon polyps, colonic diverticulosis with history of diverticulitis and sigmoid resection in 2007, history of cystocele with recent repair, possible rectocele, osteopenia and history of migraines who is seen to establish care and to discuss surveillance colonoscopy.  1. History of multiple adenomatous colon polyps -- she is due for surveillance colonoscopy at this time. She is high risk based on her history of multiple adenomatous polyps. We discussed the risks, benefits and alternatives and she is agreeable and wishes to proceed. This test will be scheduled for her while here today.  2. History of diverticulitis -- no issues with recurrent diverticulitis and surgery 11 years ago. She will continue daily MiraLAX. She asked about a probiotic recommendation. We discussed how I do not feel that  every patient needs a daily probiotic. She could consider stopping her probiotic to see if she feels better or has more normal bowel movements while taking one.   VSL3 would be a good option for her if she would like to remain on daily probiotic.     BV:[id number], [place], [place], [place] [address]

## 2017-07-02 NOTE — Patient Instructions (Signed)
You have been scheduled for a colonoscopy. Please follow written instructions given to you at your visit today.  Please pick up your prep supplies at the pharmacy within the next 1-3 days. If you use inhalers (even only as needed), please bring them with you on the day of your procedure. Your physician has requested that you go to www.startemmi.com and enter the access code given to you at your visit today. This web site gives a general overview about your procedure. However, you should still follow specific instructions given to you by our office regarding your preparation for the procedure.  If you are age 32 or older, your body mass index should be between 23-30. Your Body mass index is 26.64 kg/m. If this is out of the aforementioned range listed, please consider follow up with your Primary Care Provider.  If you are age 41 or younger, your body mass index should be between 19-25. Your Body mass index is 26.64 kg/m. If this is out of the aformentioned range listed, please consider follow up with your Primary Care Provider.

## 2017-07-03 NOTE — Telephone Encounter (Signed)
Ive advised patient that she may take miralax prior to her procedure.

## 2017-07-03 NOTE — Telephone Encounter (Signed)
Phone busy x 2. Will call back at a later time.

## 2017-07-10 ENCOUNTER — Encounter: Payer: Self-pay | Admitting: Internal Medicine

## 2017-07-10 ENCOUNTER — Ambulatory Visit (AMBULATORY_SURGERY_CENTER): Payer: Medicare Other | Admitting: Internal Medicine

## 2017-07-10 VITALS — BP 127/74 | HR 64 | Temp 97.8°F | Resp 11 | Ht 61.0 in | Wt 141.0 lb

## 2017-07-10 DIAGNOSIS — D122 Benign neoplasm of ascending colon: Secondary | ICD-10-CM | POA: Diagnosis not present

## 2017-07-10 DIAGNOSIS — Z86010 Personal history of colonic polyps: Secondary | ICD-10-CM | POA: Diagnosis not present

## 2017-07-10 DIAGNOSIS — D123 Benign neoplasm of transverse colon: Secondary | ICD-10-CM | POA: Diagnosis not present

## 2017-07-10 DIAGNOSIS — D120 Benign neoplasm of cecum: Secondary | ICD-10-CM

## 2017-07-10 DIAGNOSIS — D124 Benign neoplasm of descending colon: Secondary | ICD-10-CM

## 2017-07-10 MED ORDER — SODIUM CHLORIDE 0.9 % IV SOLN: 500.0000 mL | INTRAVENOUS | Status: DC

## 2017-07-10 NOTE — Patient Instructions (Signed)
Impression/Recommendations:  Polyp handout given to patient. Diverticulosis handout given to patient. Hemorrhoid handout given to patient.  Resume previous diet. Continue present medications.  Await pathology results.  Repeat colonoscopy is recommended.  Date to be determined after pathology results reviewed.  YOU HAD AN ENDOSCOPIC PROCEDURE TODAY AT [facility]:   Refer to the procedure report that was given to you for any specific questions about what was found during the examination.  If the procedure report does not answer your questions, please call your gastroenterologist to clarify.  If you requested that your care partner not be given the details of your procedure findings, then the procedure report has been included in a sealed envelope for you to review at your convenience later.  YOU SHOULD EXPECT: Some feelings of bloating in the abdomen. Passage of more gas than usual.  Walking can help get rid of the air that was put into your GI tract during the procedure and reduce the bloating. If you had a lower endoscopy (such as a colonoscopy or flexible sigmoidoscopy) you may notice spotting of blood in your stool or on the toilet paper. If you underwent a bowel prep for your procedure, you may not have a normal bowel movement for a few days.  Please Note:  You might notice some irritation and congestion in your nose or some drainage.  This is from the oxygen used during your procedure.  There is no need for concern and it should clear up in a day or so.  SYMPTOMS TO REPORT IMMEDIATELY:   Following lower endoscopy (colonoscopy or flexible sigmoidoscopy):  Excessive amounts of blood in the stool  Significant tenderness or worsening of abdominal pains  Swelling of the abdomen that is new, acute  Fever of 100F or higher For urgent or emergent issues, a gastroenterologist can be reached at any hour by calling [phone].   DIET:  We do recommend a small meal at  first, but then you may proceed to your regular diet.  Drink plenty of fluids but you should avoid alcoholic beverages for 24 hours.  ACTIVITY:  You should plan to take it easy for the rest of today and you should NOT DRIVE or use heavy machinery until tomorrow (because of the sedation medicines used during the test).    FOLLOW UP: Our staff will call the number listed on your records the next business day following your procedure to check on you and address any questions or concerns that you may have regarding the information given to you following your procedure. If we do not reach you, we will leave a message.  However, if you are feeling well and you are not experiencing any problems, there is no need to return our call.  We will assume that you have returned to your regular daily activities without incident.  If any biopsies were taken you will be contacted by phone or by letter within the next 1-3 weeks.  Please call us at [phone] if you have not heard about the biopsies in 3 weeks.    SIGNATURES/CONFIDENTIALITY: You and/or your care partner have signed paperwork which will be entered into your electronic medical record.  These signatures attest to the fact that that the information above on your After Visit Summary has been reviewed and is understood.  Full responsibility of the confidentiality of this discharge information lies with you and/or your care-partner.

## 2017-07-10 NOTE — Progress Notes (Signed)
A/ox3 pleased with MAC, report to Jane RN 

## 2017-07-10 NOTE — Progress Notes (Signed)
Pt's states no medical or surgical changes since previsit or office visit. 

## 2017-07-10 NOTE — Op Note (Signed)
[facility] Patient Name: Tanya Leon Procedure Date: 07/10/2017 8:38 AM MRN: [id number] Endoscopist: Jerene Bears , MD Age: 76 Referring MD:  Date of Birth: 11-Dec-1940 Gender: Female Account #: [id number] Procedure:                Colonoscopy Indications:              Surveillance: Personal history of adenomatous                            polyps on last colonoscopy 3 years ago Medicines:                Monitored Anesthesia Care Procedure:                Pre-Anesthesia Assessment:                           - Prior to the procedure, a History and Physical                            was performed, and patient medications and                            allergies were reviewed. The patient's tolerance of                            previous anesthesia was also reviewed. The risks                            and benefits of the procedure and the sedation                            options and risks were discussed with the patient.                            All questions were answered, and informed consent                            was obtained. Prior Anticoagulants: The patient has                            taken no previous anticoagulant or antiplatelet                            agents. ASA Grade Assessment: III - A patient with                            severe systemic disease. After reviewing the risks                            and benefits, the patient was deemed in                            satisfactory condition to undergo the procedure.  After obtaining informed consent, the colonoscope                            was passed under direct vision. Throughout the                            procedure, the patient's blood pressure, pulse, and                            oxygen saturations were monitored continuously. The                            Model PCF-H190DL [id number]) scope was introduced                            through the anus and  advanced to the the cecum,                            identified by appendiceal orifice and ileocecal                            valve. The colonoscopy was performed without                            difficulty. The patient tolerated the procedure                            well. The quality of the bowel preparation was fair                            clearing to good with copious irrigation and                            lavage. The ileocecal valve, appendiceal orifice,                            and rectum were photographed. Scope In: 8:53:39 AM Scope Out: 9:24:13 AM Scope Withdrawal Time: 0 hours 22 minutes 56 seconds  Total Procedure Duration: 0 hours 30 minutes 34 seconds  Findings:                 The digital rectal exam was normal.                           A possible polyp was found in the junction of ileum                            and colon on the cecal side. The possible polyp is                            flat and laterally spreading. If this is                            adenomatous, it is very difficult  to determine when                            normal ileal mucosa ends and polyp begins. This was                            biopsied with a cold forceps for histology.                           A 7 mm polyp was found in the ileocecal valve. The                            polyp was sessile and at previous polypectomy scar.                            The polyp was removed with a cold snare. Resection                            and retrieval were complete.                           A 12 mm polyp was found in the ascending colon. The                            polyp was sessile. The polyp was removed with a                            cold snare. Resection and retrieval were complete.                           A 5 mm polyp was found in the transverse colon. The                            polyp was sessile. The polyp was removed with a                            cold snare.  Resection and retrieval were complete.                           Two sessile polyps were found in the descending                            colon. The polyps were 5 to 10 mm in size. These                            polyps were removed with a cold snare. Resection                            and retrieval were complete.                           There was evidence of a prior end-to-side  colo-colonic anastomosis in the sigmoid colon. This                            was patent and was characterized by healthy                            appearing mucosa. The anastomosis was traversed.                           Multiple small and large-mouthed diverticula were                            found from cecum to sigmoid colon.                           Internal hemorrhoids were found during                            retroflexion. The hemorrhoids were medium-sized. Complications:            No immediate complications. Estimated Blood Loss:     Estimated blood loss was minimal. Impression:               - Possible flat polyp at the ileal-cecal junction.                            Biopsied.                           - One 7 mm polyp at the ileocecal valve, removed                            with a cold snare. Resected and retrieved.                           - One 12 mm polyp in the ascending colon, removed                            with a cold snare. Resected and retrieved.                           - One 5 mm polyp in the transverse colon, removed                            with a cold snare. Resected and retrieved.                           - Two 5 to 10 mm polyps in the descending colon,                            removed with a cold snare. Resected and retrieved.                           - Patent end-to-side colo-colonic anastomosis,  characterized by healthy appearing mucosa.                           - Moderate diverticulosis from cecum to  sigmoid                            colon.                           - Internal hemorrhoids. Recommendation:           - Patient has a contact number available for                            emergencies. The signs and symptoms of potential                            delayed complications were discussed with the                            patient. Return to normal activities tomorrow.                            Written discharge instructions were provided to the                            patient.                           - Resume previous diet.                           - Continue present medications.                           - Await pathology results.                           - Repeat colonoscopy is recommended. The                            colonoscopy date will be determined after pathology                            results from today's exam become available for                            review. Jerene Bears, MD 07/10/2017 9:36:47 AM This report has been signed electronically.

## 2017-07-10 NOTE — Progress Notes (Signed)
Called to room to assist during endoscopic procedure.  Patient ID and intended procedure confirmed with present staff. Received instructions for my participation in the procedure from the performing physician.  

## 2017-07-11 ENCOUNTER — Telehealth: Payer: Self-pay

## 2017-07-11 NOTE — Telephone Encounter (Signed)
Attempted to reach patient for post-procedure f/u call. Voicemail states number unavailable. Unable to leave message.

## 2017-07-11 NOTE — Telephone Encounter (Signed)
Attempted to reach patient for post-procedure f/u call. Line is busy. Will attempt to reach her again later today.

## 2017-07-14 ENCOUNTER — Telehealth: Payer: Self-pay | Admitting: *Deleted

## 2017-07-14 NOTE — Telephone Encounter (Signed)
  Follow up Call-  Call back number 07/10/2017  Post procedure Call Back phone  # [phone]  Permission to leave phone message Yes  Some recent data might be hidden     Patient questions:  Do you have a fever, pain , or abdominal swelling? No. Pain Score  0 *  Have you tolerated food without any problems? Yes.    Have you been able to return to your normal activities? Yes.    Do you have any questions about your discharge instructions: Diet   No. Medications  No. Follow up visit  No.  Do you have questions or concerns about your Care? No.  Actions: * If pain score is 4 or above: No action needed, pain <4.

## 2017-07-21 ENCOUNTER — Ambulatory Visit (INDEPENDENT_AMBULATORY_CARE_PROVIDER_SITE_OTHER): Payer: Medicare Other | Admitting: Podiatry

## 2017-07-21 ENCOUNTER — Encounter: Payer: Self-pay | Admitting: Podiatry

## 2017-07-21 DIAGNOSIS — B351 Tinea unguium: Secondary | ICD-10-CM | POA: Diagnosis not present

## 2017-07-21 DIAGNOSIS — L603 Nail dystrophy: Secondary | ICD-10-CM | POA: Diagnosis not present

## 2017-07-23 ENCOUNTER — Telehealth: Payer: Self-pay | Admitting: Internal Medicine

## 2017-07-23 NOTE — Progress Notes (Signed)
Subjective: Clyda Smyth the office today for follow-up evaluation of nail fungus as well as nail dystrophy to bilateral hallux toenails. She has been having some extremes of the laser. She states the right big toenail has been doing much better and she is very happy at the results. She states the left side is doing better but still has some "odd shape" to the nail. Denies any redness or drainage or any swelling. She has no pain. She has no other concerns today. Denies any systemic complaints such as fevers, chills, nausea, vomiting. No acute changes since last appointment, and no other complaints at this time.   Objective: AAO x3, NAD DP/PT pulses palpable bilaterally, CRT less than 3 seconds Bilateral hallux toenail are much improved. The right nail looks almost normal toenail. There is still some dystrophy to the left hallux toenail and has not grown out completely. Nail the left is still somewhat thickened. There is no pain of the nail there is no swelling redness or drainage. There is no other areas of tenderness of benefit at this time. No open lesions or pre-ulcerative lesions.  No pain with calf compression, swelling, warmth, erythema  Assessment: Bilateral hallux onychodystrophy, onychomycosis  Plan: -All treatment options discussed with the patient including all alternatives, risks, complications.  -I believe the toenails have been much improvement since I last saw her in the please of the results. The nails continue to grow out if he wished due to more laser treatments. We'll plan on doing one next week and maybe towards the end of the year. She agrees to this. She's been using Coconut oil/T tree oil on the nails on her continue with this as well. Monitor for signs or symptoms of infection. -Patient encouraged to call the office with any questions, concerns, change in symptoms.   Celesta Gentile, DPM

## 2017-07-23 NOTE — Telephone Encounter (Signed)
Pt has decided she would like to schedule the polypectomy with Dr. Hilarie Fredrickson. She would like to do this after Thanksgiving if possible. Please advise.

## 2017-07-24 NOTE — Telephone Encounter (Signed)
[place] for colonoscopy at [facility] in outpatient double slot for polypectomy (will need methylene blue that day for chromoendoscopy).  Ok to wait until after Thanksgiving Needs 2 day prep

## 2017-07-25 ENCOUNTER — Other Ambulatory Visit: Payer: Self-pay

## 2017-07-25 DIAGNOSIS — K635 Polyp of colon: Secondary | ICD-10-CM

## 2017-07-25 NOTE — Telephone Encounter (Signed)
Pt scheduled for previsit 08/08/17@9am . Colon scheduled at [facility] 08/29/17@11 :45am. Pt to arrive and check in with admitting at 10:15am. Pt aware of appts.

## 2017-07-30 ENCOUNTER — Ambulatory Visit: Payer: Medicare Other | Admitting: Podiatry

## 2017-07-30 DIAGNOSIS — B351 Tinea unguium: Secondary | ICD-10-CM

## 2017-07-30 NOTE — Progress Notes (Signed)
Pt presents with mycotic infection of nails 1-5 bilateral  All other systems are negative  Laser therapy administered to affected nails and tolerated well. All safety precautions were in place. Re-appointed in December for 2nd and final treatment

## 2017-07-31 ENCOUNTER — Telehealth: Payer: Self-pay | Admitting: Family Medicine

## 2017-07-31 ENCOUNTER — Other Ambulatory Visit: Payer: Self-pay

## 2017-07-31 DIAGNOSIS — Z23 Encounter for immunization: Secondary | ICD-10-CM

## 2017-07-31 NOTE — Telephone Encounter (Signed)
Wants script for shingrix vaccine. Pharmacy is out of the vaccine.  Wants it sent to [facility]  [address]  Would like a call to confirm this has been completed.  Ty,  -LL

## 2017-08-05 ENCOUNTER — Other Ambulatory Visit: Payer: Self-pay

## 2017-08-05 ENCOUNTER — Other Ambulatory Visit: Payer: Self-pay | Admitting: Family Medicine

## 2017-08-05 MED ORDER — ZOSTER VAC RECOMB ADJUVANTED 50 MCG/0.5ML IM SUSR: 0.5000 mL | Freq: Once | INTRAMUSCULAR | 0 refills | Status: DC

## 2017-08-05 MED FILL — SHINGRIX 50 MCG SUS: 50 | 1 days supply | Qty: 1 | Fill #0

## 2017-08-05 NOTE — Addendum Note (Signed)
Addended by: Marian Sorrow on: 08/05/2017 11:00 AM   Modules accepted: Orders

## 2017-08-05 NOTE — Telephone Encounter (Signed)
Rx for Shingrix sent to pharmacy as requested.

## 2017-08-05 NOTE — Telephone Encounter (Signed)
Spoke to pt, told her Rx for Shingrix vaccine injection was sent to pharmacy. Also told her to double check with insurance prior to getting shot to make sure covered. Tanya Leon verbalized understanding.

## 2017-08-08 ENCOUNTER — Ambulatory Visit (AMBULATORY_SURGERY_CENTER): Payer: Self-pay

## 2017-08-08 VITALS — Ht 62.0 in | Wt 140.6 lb

## 2017-08-08 DIAGNOSIS — Z860100 Personal history of colon polyps, unspecified: Secondary | ICD-10-CM

## 2017-08-08 MED ORDER — SUPREP BOWEL PREP KIT 17.5-3.13-1.6 GM/177ML PO SOLN: 1.0000 | Freq: Once | ORAL | 0 refills | Status: AC

## 2017-08-08 NOTE — Progress Notes (Signed)
After hernia surgery had some respiratory distress; thought they gave her too much  No allergies to eggs or soy No diet meds No home oxygen  Declined emmi

## 2017-08-11 ENCOUNTER — Other Ambulatory Visit: Payer: Self-pay

## 2017-08-11 DIAGNOSIS — H40052 Ocular hypertension, left eye: Secondary | ICD-10-CM | POA: Diagnosis not present

## 2017-08-11 DIAGNOSIS — H01009 Unspecified blepharitis unspecified eye, unspecified eyelid: Secondary | ICD-10-CM | POA: Diagnosis not present

## 2017-08-11 DIAGNOSIS — H1013 Acute atopic conjunctivitis, bilateral: Secondary | ICD-10-CM | POA: Diagnosis not present

## 2017-08-11 DIAGNOSIS — H401132 Primary open-angle glaucoma, bilateral, moderate stage: Secondary | ICD-10-CM | POA: Diagnosis not present

## 2017-08-11 MED ORDER — DICLOFENAC SODIUM 75 MG PO TBEC: 75.0000 mg | DELAYED_RELEASE_TABLET | Freq: Two times a day (BID) | ORAL | 0 refills | Status: DC | PRN

## 2017-08-18 ENCOUNTER — Encounter (HOSPITAL_COMMUNITY): Payer: Self-pay | Admitting: Emergency Medicine

## 2017-08-18 ENCOUNTER — Other Ambulatory Visit: Payer: Self-pay

## 2017-08-18 DIAGNOSIS — H16223 Keratoconjunctivitis sicca, not specified as Sjogren's, bilateral: Secondary | ICD-10-CM | POA: Diagnosis not present

## 2017-08-18 DIAGNOSIS — H01009 Unspecified blepharitis unspecified eye, unspecified eyelid: Secondary | ICD-10-CM | POA: Diagnosis not present

## 2017-08-18 DIAGNOSIS — Z961 Presence of intraocular lens: Secondary | ICD-10-CM | POA: Diagnosis not present

## 2017-08-18 DIAGNOSIS — H3530 Unspecified macular degeneration: Secondary | ICD-10-CM | POA: Diagnosis not present

## 2017-08-29 ENCOUNTER — Ambulatory Visit (HOSPITAL_COMMUNITY): Payer: Medicare Other | Admitting: Anesthesiology

## 2017-08-29 ENCOUNTER — Other Ambulatory Visit: Payer: Self-pay

## 2017-08-29 ENCOUNTER — Encounter (HOSPITAL_COMMUNITY): Payer: Self-pay | Admitting: Certified Registered Nurse Anesthetist

## 2017-08-29 ENCOUNTER — Ambulatory Visit (HOSPITAL_COMMUNITY)
Admission: RE | Admit: 2017-08-29 | Discharge: 2017-08-29 | Disposition: A | Discharge status: Home / Self Care | Payer: Medicare Other | Source: Ambulatory Visit | Attending: Internal Medicine | Admitting: Internal Medicine

## 2017-08-29 ENCOUNTER — Encounter (HOSPITAL_COMMUNITY)
Admission: RE | Disposition: A | Discharge status: Home / Self Care | Payer: Self-pay | Source: Ambulatory Visit | Attending: Internal Medicine

## 2017-08-29 DIAGNOSIS — Z8249 Family history of ischemic heart disease and other diseases of the circulatory system: Secondary | ICD-10-CM | POA: Insufficient documentation

## 2017-08-29 DIAGNOSIS — Z981 Arthrodesis status: Secondary | ICD-10-CM | POA: Insufficient documentation

## 2017-08-29 DIAGNOSIS — Z8371 Family history of colonic polyps: Secondary | ICD-10-CM | POA: Insufficient documentation

## 2017-08-29 DIAGNOSIS — Z833 Family history of diabetes mellitus: Secondary | ICD-10-CM | POA: Insufficient documentation

## 2017-08-29 DIAGNOSIS — F329 Major depressive disorder, single episode, unspecified: Secondary | ICD-10-CM | POA: Insufficient documentation

## 2017-08-29 DIAGNOSIS — I081 Rheumatic disorders of both mitral and tricuspid valves: Secondary | ICD-10-CM | POA: Insufficient documentation

## 2017-08-29 DIAGNOSIS — K635 Polyp of colon: Secondary | ICD-10-CM | POA: Diagnosis not present

## 2017-08-29 DIAGNOSIS — Z9841 Cataract extraction status, right eye: Secondary | ICD-10-CM | POA: Insufficient documentation

## 2017-08-29 DIAGNOSIS — I493 Ventricular premature depolarization: Secondary | ICD-10-CM | POA: Diagnosis not present

## 2017-08-29 DIAGNOSIS — D122 Benign neoplasm of ascending colon: Secondary | ICD-10-CM | POA: Diagnosis not present

## 2017-08-29 DIAGNOSIS — M545 Low back pain: Secondary | ICD-10-CM | POA: Diagnosis not present

## 2017-08-29 DIAGNOSIS — Z820 Family history of epilepsy and other diseases of the nervous system: Secondary | ICD-10-CM | POA: Insufficient documentation

## 2017-08-29 DIAGNOSIS — Z803 Family history of malignant neoplasm of breast: Secondary | ICD-10-CM | POA: Insufficient documentation

## 2017-08-29 DIAGNOSIS — D120 Benign neoplasm of cecum: Secondary | ICD-10-CM | POA: Diagnosis not present

## 2017-08-29 DIAGNOSIS — M8580 Other specified disorders of bone density and structure, unspecified site: Secondary | ICD-10-CM | POA: Insufficient documentation

## 2017-08-29 DIAGNOSIS — Z86010 Personal history of colonic polyps: Secondary | ICD-10-CM | POA: Diagnosis not present

## 2017-08-29 DIAGNOSIS — Z91013 Allergy to seafood: Secondary | ICD-10-CM | POA: Insufficient documentation

## 2017-08-29 DIAGNOSIS — R011 Cardiac murmur, unspecified: Secondary | ICD-10-CM | POA: Insufficient documentation

## 2017-08-29 DIAGNOSIS — G43909 Migraine, unspecified, not intractable, without status migrainosus: Secondary | ICD-10-CM | POA: Diagnosis not present

## 2017-08-29 DIAGNOSIS — M479 Spondylosis, unspecified: Secondary | ICD-10-CM | POA: Diagnosis not present

## 2017-08-29 DIAGNOSIS — Z90710 Acquired absence of both cervix and uterus: Secondary | ICD-10-CM | POA: Insufficient documentation

## 2017-08-29 DIAGNOSIS — Z9842 Cataract extraction status, left eye: Secondary | ICD-10-CM | POA: Insufficient documentation

## 2017-08-29 DIAGNOSIS — Z8042 Family history of malignant neoplasm of prostate: Secondary | ICD-10-CM | POA: Insufficient documentation

## 2017-08-29 DIAGNOSIS — Z96641 Presence of right artificial hip joint: Secondary | ICD-10-CM | POA: Insufficient documentation

## 2017-08-29 DIAGNOSIS — K648 Other hemorrhoids: Secondary | ICD-10-CM | POA: Diagnosis not present

## 2017-08-29 DIAGNOSIS — I272 Pulmonary hypertension, unspecified: Secondary | ICD-10-CM | POA: Diagnosis not present

## 2017-08-29 DIAGNOSIS — Z85828 Personal history of other malignant neoplasm of skin: Secondary | ICD-10-CM | POA: Diagnosis not present

## 2017-08-29 DIAGNOSIS — Z860101 Personal history of adenomatous and serrated colon polyps: Secondary | ICD-10-CM

## 2017-08-29 DIAGNOSIS — Z980 Intestinal bypass and anastomosis status: Secondary | ICD-10-CM | POA: Diagnosis not present

## 2017-08-29 DIAGNOSIS — K5900 Constipation, unspecified: Secondary | ICD-10-CM | POA: Insufficient documentation

## 2017-08-29 DIAGNOSIS — Z96653 Presence of artificial knee joint, bilateral: Secondary | ICD-10-CM | POA: Diagnosis not present

## 2017-08-29 HISTORY — PX: COLONOSCOPY: SHX5424

## 2017-08-29 HISTORY — PX: POLYPECTOMY: SHX5525

## 2017-08-29 SURGERY — COLONOSCOPY
Anesthesia: Monitor Anesthesia Care

## 2017-08-29 MED ORDER — METHYLENE BLUE 0.5 % INJ SOLN
INTRAVENOUS | Status: AC
  Filled 2017-08-29: qty 10

## 2017-08-29 MED ORDER — PROPOFOL 10 MG/ML IV BOLUS
INTRAVENOUS | Status: DC | PRN
  Administered 2017-08-29 (×4): 10 mg via INTRAVENOUS
  Administered 2017-08-29 (×2): 20 mg via INTRAVENOUS
  Administered 2017-08-29: 10 mg via INTRAVENOUS
  Administered 2017-08-29: 20 mg via INTRAVENOUS
  Administered 2017-08-29: 40 mg via INTRAVENOUS
  Administered 2017-08-29: 10 mg via INTRAVENOUS
  Administered 2017-08-29: 20 mg via INTRAVENOUS
  Administered 2017-08-29 (×2): 10 mg via INTRAVENOUS
  Administered 2017-08-29: 20 mg via INTRAVENOUS
  Administered 2017-08-29: 10 mg via INTRAVENOUS
  Administered 2017-08-29: 20 mg via INTRAVENOUS

## 2017-08-29 MED ORDER — PROPOFOL 10 MG/ML IV BOLUS
INTRAVENOUS | Status: AC
  Filled 2017-08-29: qty 40

## 2017-08-29 MED ORDER — LACTATED RINGERS IV SOLN
INTRAVENOUS | Status: DC | PRN
  Administered 2017-08-29: 09:00:00 via INTRAVENOUS

## 2017-08-29 MED ORDER — LACTATED RINGERS IV SOLN
INTRAVENOUS | Status: DC
  Administered 2017-08-29: 1000 mL via INTRAVENOUS

## 2017-08-29 MED ORDER — FENTANYL CITRATE (PF) 100 MCG/2ML IJ SOLN: 25.0000 ug | INTRAMUSCULAR | Status: DC | PRN

## 2017-08-29 MED ORDER — SODIUM CHLORIDE 0.9 % IV SOLN: INTRAVENOUS | Status: DC

## 2017-08-29 MED ORDER — MEPERIDINE HCL 25 MG/ML IJ SOLN: 6.2500 mg | INTRAMUSCULAR | Status: DC | PRN

## 2017-08-29 NOTE — H&P (Signed)
HPI: Tanya Leon is a 76 year old female with a past medical history of multiple adenomatous polyps, colonic diverticulosis with history of diverticulitis, history of sigmoid resection in 2007 who presents for  repeat colonoscopy with polypectomy.  She had a colonoscopy which I performed on 07/10/2017.  This revealed a flat and sessile lesion approximating the IC valve and extending into the cecum raising the suspicion for adenoma.  This was biopsied and indeed found to be tubular adenoma.  She had additional polyps removed from the ascending colon side of the ileocecal valve, the ascending colon, transverse colon and descending colon.  These were found to be adenomas as well.  We discussed options for removing the cecal polyp which included surgical resection, tertiary  care center for polypectomy/EMR versus repeat colonoscopy in the hospital setting with polypectomy with me.  She elected the last option after discussing the risks, benefits and alternatives.  Recently she has no new complaints.  No abdominal pain.  She tolerated the 2-day prep.  Past Medical History:  Diagnosis Date  . Arthritis   . Cancer (HCC)    hx of skin cancer   . Colon polyps    adenomatous  . Complication of anesthesia    " pt stated they gave me too much - kept having to be reminded to breathe   . Constipation    opiod induced associated with back pain  . Depression    hx of   . Diverticulosis   . DIVERTICULOSIS, COLON 01/13/2009  . Headache    history of migraines  . Heart murmur    benign, dagnosed around age 22,prophylactic antibiotic  . Low back pain   . Lumbar spondylolysis   . Osteopenia   . Pneumonia    hx of as a baby   . PVC's (premature ventricular contractions)   . Rectocele   . Tubular adenoma of colon     Past Surgical History:  Procedure Laterality Date  . ABDOMINAL HERNIA REPAIR Right    incisional  . ABDOMINAL HYSTERECTOMY     with ovaries  . BACK SURGERY  2009   L4-L5 with rods   . BLADDER SUSPENSION N/A 04/09/2017   Procedure: TRANSVAGINAL TAPE (TVT) PROCEDURE;  Surgeon: Bobbye Charleston, MD;  Location: [facility];  Service: Gynecology;  Laterality: N/A;  . CATARACT EXTRACTION, BILATERAL    . cataract left lens replacement     2016  . COLONOSCOPY    . CYSTOCELE REPAIR N/A 04/09/2017   Procedure: ANTERIOR REPAIR (CYSTOCELE);  Surgeon: Bobbye Charleston, MD;  Location: [facility];  Service: Gynecology;  Laterality: N/A;  . CYSTOSCOPY N/A 04/09/2017   Procedure: CYSTOSCOPY;  Surgeon: Bobbye Charleston, MD;  Location: [facility];  Service: Gynecology;  Laterality: N/A;  . EYE MUSCLE SURGERY    . GLAUCOMA SURGERY Left 05/12/14  . incisional ab  05/28/11  . JOINT REPLACEMENT     right hip replacement   . LUMBAR FUSION     L4-L5  . PARTIAL KNEE ARTHROPLASTY Right   . PARTIAL KNEE ARTHROPLASTY Left   . perforated deverticular abscess/laporotomy and colostomy     depression after this  . stribismus eye surgery Right   . take down of colostomy    . tonsillectomly    . TONSILLECTOMY    . TOTAL HIP ARTHROPLASTY Right   . TOTAL KNEE REVISION Left 11/24/2015   Procedure: Conversion from left lateral unicompartmental knee arthroplasty to left total knee arthroplasty;  Surgeon: Mcarthur Rossetti, MD;  Location: [facility];  Service: Orthopedics;  Laterality: Left;     (Not in an outpatient encounter)  Allergies  Allergen Reactions  . Shellfish Allergy Swelling    Family History  Problem Relation Age of Onset  . Alzheimer's disease Father   . Heart disease Father        mitral valve replaced. unknown reason  . Breast cancer Mother        22  . Colon polyps Mother   . Diabetes Mother   . Heart disease Mother 40       CHF, MI 66  . Heart attack Mother        x 2  . Prostate cancer Brother   . Colon cancer Neg Hx     Social History   Tobacco Use  . Smoking status: Never Smoker  . Smokeless tobacco: Never Used  Substance Use Topics  . Alcohol use: Yes     Alcohol/week: 4.2 oz    Types: 7 Glasses of wine per week    Comment: 1 glass of wine daily   . Drug use: No    ROS: As per history of present illness, otherwise negative  BP (!) 145/61   Pulse (!) 57   Resp 16   Ht 5\' 2"  (1.575 m)   Wt 140 lb (63.5 kg)   BMI 25.61 kg/m  Gen: awake, alert, NAD HEENT: anicteric, op clear CV: RRR, no mrg Pulm: CTA b/l Abd: soft, NT/ND, +BS throughout Ext: no c/c/e Neuro: nonfocal   RELEVANT LABS AND IMAGING: CBC    Component Value Date/Time   WBC 4.5 06/30/2017 1001   RBC 4.79 06/30/2017 1001   HGB 14.8 06/30/2017 1001   HGB 13.3 01/22/2016 0000   HCT 44.7 06/30/2017 1001   HCT 40.9 01/22/2016 0000   PLT 155.0 06/30/2017 1001   MCV 93.3 06/30/2017 1001   MCV 89 01/22/2016 0000   MCH 31.0 04/03/2017 1350   MCHC 33.1 06/30/2017 1001   RDW 13.7 06/30/2017 1001   RDW 14.8 01/22/2016 0000   LYMPHSABS 1.4 06/30/2017 1001   LYMPHSABS 1.7 01/22/2016 0000   MONOABS 0.4 06/30/2017 1001   EOSABS 0.1 06/30/2017 1001   EOSABS 0.1 01/22/2016 0000   BASOSABS 0.0 06/30/2017 1001   BASOSABS 0.0 01/22/2016 0000    CMP     Component Value Date/Time   NA 139 06/30/2017 1001   K 4.5 06/30/2017 1001   CL 101 06/30/2017 1001   CO2 30 06/30/2017 1001   GLUCOSE 85 06/30/2017 1001   BUN 15 06/30/2017 1001   CREATININE 0.75 06/30/2017 1001   CALCIUM 9.6 06/30/2017 1001   PROT 6.7 06/30/2017 1001   PROT 6.5 02/28/2016 1524   ALBUMIN 4.5 06/30/2017 1001   ALBUMIN 4.4 02/28/2016 1524   AST 13 06/30/2017 1001   ALT 12 06/30/2017 1001   ALKPHOS 59 06/30/2017 1001   BILITOT 1.2 06/30/2017 1001   BILITOT 0.3 02/28/2016 1524   GFRNONAA >60 11/25/2015 0424   GFRAA >60 11/25/2015 0424    ASSESSMENT/PLAN: 76 year old female with a past medical history of multiple adenomatous polyps, colonic diverticulosis with history of diverticulitis, history of sigmoid resection in 2007 who presents for  repeat colonoscopy with polypectomy.  1.  History of  adenomatous colon polyps/known cecal adenoma growing towards IC valve --plan for colonoscopy today with polypectomy.  Performing in the hospital setting due to availability of APC as well as chromoendoscopy.  We discussed the higher risk nature of this procedure including a higher risk of  bleeding both immediate and delayed post polypectomy bleeding and perforation.  Ample time for discussion and questions allowed. The patient understood, was satisfied, and agreed to proceed.  She also is aware of the probable short follow-up colonoscopy interval to ensure complete resection from today's intervention.

## 2017-08-29 NOTE — Anesthesia Preprocedure Evaluation (Signed)
Anesthesia Evaluation  Patient identified by MRN, date of birth, ID band Patient awake    Reviewed: Allergy & Precautions, H&P , Patient's Chart, lab work & pertinent test results, reviewed documented beta blocker date and time   Airway Mallampati: II  TM Distance: >3 FB Neck ROM: full    Dental no notable dental hx.    Pulmonary    Pulmonary exam normal breath sounds clear to auscultation       Cardiovascular  Rhythm:regular Rate:Normal     Neuro/Psych    GI/Hepatic   Endo/Other    Renal/GU      Musculoskeletal   Abdominal   Peds  Hematology   Anesthesia Other Findings Echo 7/18 - Left ventricle: The cavity size was normal. Wall thickness was   increased in a pattern of severe LVH. Systolic function was   normal. The estimated ejection fraction was in the range of 60%   to 65%. Wall motion was normal; there were no regional wall   motion abnormalities. Doppler parameters are consistent with   abnormal left ventricular relaxation (grade 1 diastolic   dysfunction). - Mitral valve: There was moderate regurgitation. - Left atrium: The atrium was moderately dilated. - Tricuspid valve: There was moderate regurgitation. - Pulmonary arteries: PA peak pressure: 40 mm Hg (S).  Reproductive/Obstetrics                             Anesthesia Physical  Anesthesia Plan  ASA: II  Anesthesia Plan: MAC   Post-op Pain Management:    Induction: Intravenous  PONV Risk Score and Plan: 2 and Treatment may vary due to age or medical condition  Airway Management Planned: Nasal Cannula, Natural Airway, Simple Face Mask and Mask  Additional Equipment:   Intra-op Plan:   Post-operative Plan:   Informed Consent: I have reviewed the patients History and Physical, chart, labs and discussed the procedure including the risks, benefits and alternatives for the proposed anesthesia with the patient or  authorized representative who has indicated his/her understanding and acceptance.   Dental Advisory Given  Plan Discussed with: CRNA and Surgeon  Anesthesia Plan Comments: ( )        Anesthesia Quick Evaluation

## 2017-08-29 NOTE — Transfer of Care (Signed)
Immediate Anesthesia Transfer of Care Note  Patient: Tanya Leon  Procedure(s) Performed: COLONOSCOPY (N/A ) POLYPECTOMY (N/A )  Patient Location: PACU and Endoscopy Unit  Anesthesia Type:MAC  Level of Consciousness: awake, oriented, drowsy, patient cooperative and responds to stimulation  Airway & Oxygen Therapy: Patient Spontanous Breathing and Patient connected to face mask oxygen  Post-op Assessment: Report given to RN, Post -op Vital signs reviewed and stable and Patient moving all extremities  Post vital signs: Reviewed and stable  Last Vitals:  Vitals:   08/29/17 0932  BP: (!) 145/61  Pulse: (!) 57  Resp: 16  Temp: 36.6 C    Last Pain:  Vitals:   08/29/17 0932  TempSrc: Oral         Complications: No apparent anesthesia complications

## 2017-08-29 NOTE — Op Note (Signed)
[facility] Patient Name: Tanya Leon Procedure Date: 08/29/2017 MRN: [id number] Attending MD: Jerene Bears , MD Date of Birth: 1940/10/07 CSN: [id number] Age: 76 Admit Type: Outpatient Procedure:                Colonoscopy Indications:              Therapeutic procedure for known colon adenoma (flat                            adenoma seen at colonoscopy 07/10/2017 and                            biopsied); history of multiple adenomatous colon                            polyps Providers:                Lajuan Lines. Hilarie Fredrickson, MD, Carolynn Comment RN, RN, Danford Bad, Technician, Dion Saucier, CRNA Referring MD:              Medicines:                Monitored Anesthesia Care Complications:            No immediate complications. Estimated Blood Loss:     Estimated blood loss was minimal. Procedure:                Pre-Anesthesia Assessment:                           - Prior to the procedure, a History and Physical                            was performed, and patient medications and                            allergies were reviewed. The patient's tolerance of                            previous anesthesia was also reviewed. The risks                            and benefits of the procedure and the sedation                            options and risks were discussed with the patient.                            All questions were answered, and informed consent                            was obtained. Prior Anticoagulants: The patient has  taken no previous anticoagulant or antiplatelet                            agents. ASA Grade Assessment: II - A patient with                            mild systemic disease. After reviewing the risks                            and benefits, the patient was deemed in                            satisfactory condition to undergo the procedure.                           After obtaining  informed consent, the colonoscope                            was passed under direct vision. Throughout the                            procedure, the patient's blood pressure, pulse, and                            oxygen saturations were monitored continuously. The                            EC-[id number] ([id number]) scope was introduced through                            the anus and advanced to the the cecum, identified                            by appendiceal orifice and ileocecal valve. The                            colonoscopy was performed without difficulty. The                            patient tolerated the procedure well. The quality                            of the bowel preparation was good. The ileocecal                            valve, appendiceal orifice, and rectum were                            photographed. The bowel preparation used was 2 day                            prep with MiraLax with Dulcolax on day 1 and Suprep  on day 2. Scope In: 10:01:39 AM Scope Out: 10:49:04 AM Scope Withdrawal Time: 0 hours 38 minutes 36 seconds  Total Procedure Duration: 0 hours 47 minutes 25 seconds  Findings:      The digital rectal exam was normal.      A 4 mm polyp was found in the cecum. The polyp was sessile. The polyp       was removed with a cold snare. Resection and retrieval were complete.      Staining chromoscopy with methylene blue was performed in the proximal       ascending colon and in the cecum.      A 15-20 mm polyp was found in the cecum. The polyp was flat. The polyp       was removed with a piecemeal technique using a cold snare. Lastly cold       forceps were used around the edges to ensure resection. Resection and       retrieval were felt to be complete.      A 2 mm polyp was found in the ascending colon. The polyp was sessile.       The polyp was removed with a cold biopsy forceps. Resection and       retrieval were complete.       Multiple small and large-mouthed diverticula were found from ascending       colon to sigmoid colon.      There was evidence of a prior end-to-side colo-colonic anastomosis in       the sigmoid colon. This was patent and was characterized by healthy       appearing mucosa.      The retroflexed view of the distal rectum and anal verge was normal and       showed no anal or rectal abnormalities. Impression:               - One 4 mm polyp in the cecum, removed with a cold                            snare. Resected and retrieved.                           - Chromoscopy was performed in the proximal                            ascending colon and in the cecum.                           - One 15-20 mm polyp in the cecum, removed with                            chromoendocopy piecemeal using a cold snare and                            cold forceps. Resected and retrieved.                           - One 2 mm polyp in the ascending colon, removed  with a cold biopsy forceps. Resected and retrieved.                           - Moderate diverticulosis from ascending colon to                            sigmoid colon.                           - Patent end-to-side colo-colonic anastomosis,                            characterized by healthy appearing mucosa.                           - The distal rectum and anal verge are normal on                            retroflexion view. Moderate Sedation:      N/A Recommendation:           - Patient has a contact number available for                            emergencies. The signs and symptoms of potential                            delayed complications were discussed with the                            patient. Return to normal activities tomorrow.                            Written discharge instructions were provided to the                            patient.                           - Resume previous diet.                            - Continue present medications.                           - Await pathology results.                           - Repeat colonoscopy is recommended for                            surveillance after piecemeal polypectomy. The                            colonoscopy date will be determined after pathology  results from today's exam become available for                            review. Procedure Code(s):        --- Professional ---                           [phone], Colonoscopy, flexible; with removal of                            tumor(s), polyp(s), or other lesion(s) by snare                            technique                           45380, 31, Colonoscopy, flexible; with biopsy,                            single or multiple                           45399, Unlisted procedure, colon Diagnosis Code(s):        --- Professional ---                           D12.0, Benign neoplasm of cecum                           D12.2, Benign neoplasm of ascending colon                           K64.8, Other hemorrhoids                           Z98.0, Intestinal bypass and anastomosis status                           D12.6, Benign neoplasm of colon, unspecified                           K57.30, Diverticulosis of large intestine without                            perforation or abscess without bleeding CPT copyright 2016 [employer]. All rights reserved. The codes documented in this report are preliminary and upon coder review may  be revised to meet current compliance requirements. Jerene Bears, MD 08/29/2017 11:12:32 AM This report has been signed electronically. Number of Addenda: 0

## 2017-08-29 NOTE — Discharge Instructions (Signed)
Monitored Anesthesia Care, Care After These instructions provide you with information about caring for yourself after your procedure. Your health care provider may also give you more specific instructions. Your treatment has been planned according to current medical practices, but problems sometimes occur. Call your health care provider if you have any problems or questions after your procedure. What can I expect after the procedure? After your procedure, it is common to:  Feel sleepy for several hours.  Feel clumsy and have poor balance for several hours.  Feel forgetful about what happened after the procedure.  Have poor judgment for several hours.  Feel nauseous or vomit.  Have a sore throat if you had a breathing tube during the procedure.  Follow these instructions at home: For at least 24 hours after the procedure:   Do not: ? Participate in activities in which you could fall or become injured. ? Drive. ? Use heavy machinery. ? Drink alcohol. ? Take sleeping pills or medicines that cause drowsiness. ? Make important decisions or sign legal documents. ? Take care of children on your own.  Rest. Eating and drinking  Follow the diet that is recommended by your health care provider.  If you vomit, drink water, juice, or soup when you can drink without vomiting.  Make sure you have little or no nausea before eating solid foods. General instructions  Have a responsible adult stay with you until you are awake and alert.  Take over-the-counter and prescription medicines only as told by your health care provider.  If you smoke, do not smoke without supervision.  Keep all follow-up visits as told by your health care provider. This is important. Contact a health care provider if:  You keep feeling nauseous or you keep vomiting.  You feel light-headed.  You develop a rash.  You have a fever. Get help right away if:  You have trouble breathing. This information is  not intended to replace advice given to you by your health care provider. Make sure you discuss any questions you have with your health care provider. Document Released: 01/07/2016 Document Revised: 05/08/2016 Document Reviewed: 01/07/2016 Elsevier Interactive Patient Education  2018 [place] HAD AN ENDOSCOPIC PROCEDURE TODAY: Refer to the procedure report and other information in the discharge instructions given to you for any specific questions about what was found during the examination. If this information does not answer your questions, please call [place] office at [phone] to clarify.   YOU SHOULD EXPECT: Some feelings of bloating in the abdomen. Passage of more gas than usual. Walking can help get rid of the air that was put into your GI tract during the procedure and reduce the bloating. If you had a lower endoscopy (such as a colonoscopy or flexible sigmoidoscopy) you may notice spotting of blood in your stool or on the toilet paper. Some abdominal soreness may be present for a day or two, also.  DIET: Your first meal following the procedure should be a light meal and then it is ok to progress to your normal diet. A half-sandwich or bowl of soup is an example of a good first meal. Heavy or fried foods are harder to digest and may make you feel nauseous or bloated. Drink plenty of fluids but you should avoid alcoholic beverages for 24 hours. If you had a esophageal dilation, please see attached instructions for diet.    ACTIVITY: Your care partner should take you home directly after the procedure. You should plan to take  it easy, moving slowly for the rest of the day. You can resume normal activity the day after the procedure however YOU SHOULD NOT DRIVE, use power tools, machinery or perform tasks that involve climbing or major physical exertion for 24 hours (because of the sedation medicines used during the test).   SYMPTOMS TO REPORT IMMEDIATELY: A gastroenterologist can be  reached at any hour. Please call [phone]  for any of the following symptoms:  Following lower endoscopy (colonoscopy, flexible sigmoidoscopy) Excessive amounts of blood in the stool  Significant tenderness, worsening of abdominal pains  Swelling of the abdomen that is new, acute  Fever of 100 or higher  Following upper endoscopy (EGD, EUS, ERCP, esophageal dilation) Vomiting of blood or coffee ground material  New, significant abdominal pain  New, significant chest pain or pain under the shoulder blades  Painful or persistently difficult swallowing  New shortness of breath  Black, tarry-looking or red, bloody stools  FOLLOW UP:  If any biopsies were taken you will be contacted by phone or by letter within the next 1-3 weeks. Call [phone]  if you have not heard about the biopsies in 3 weeks.  Please also call with any specific questions about appointments or follow up tests. YOU HAD AN ENDOSCOPIC PROCEDURE TODAY: Refer to the procedure report and other information in the discharge instructions given to you for any specific questions about what was found during the examination. If this information does not answer your questions, please call [place] office at [phone] to clarify.   YOU SHOULD EXPECT: Some feelings of bloating in the abdomen. Passage of more gas than usual. Walking can help get rid of the air that was put into your GI tract during the procedure and reduce the bloating. If you had a lower endoscopy (such as a colonoscopy or flexible sigmoidoscopy) you may notice spotting of blood in your stool or on the toilet paper. Some abdominal soreness may be present for a day or two, also.  DIET: Your first meal following the procedure should be a light meal and then it is ok to progress to your normal diet. A half-sandwich or bowl of soup is an example of a good first meal. Heavy or fried foods are harder to digest and may make you feel nauseous or bloated. Drink plenty of  fluids but you should avoid alcoholic beverages for 24 hours. If you had a esophageal dilation, please see attached instructions for diet.    ACTIVITY: Your care partner should take you home directly after the procedure. You should plan to take it easy, moving slowly for the rest of the day. You can resume normal activity the day after the procedure however YOU SHOULD NOT DRIVE, use power tools, machinery or perform tasks that involve climbing or major physical exertion for 24 hours (because of the sedation medicines used during the test).   SYMPTOMS TO REPORT IMMEDIATELY: A gastroenterologist can be reached at any hour. Please call [phone]  for any of the following symptoms:  Following lower endoscopy (colonoscopy, flexible sigmoidoscopy) Excessive amounts of blood in the stool  Significant tenderness, worsening of abdominal pains  Swelling of the abdomen that is new, acute  Fever of 100 or higher  Following upper endoscopy (EGD, EUS, ERCP, esophageal dilation) Vomiting of blood or coffee ground material  New, significant abdominal pain  New, significant chest pain or pain under the shoulder blades  Painful or persistently difficult swallowing  New shortness of breath  Black, tarry-looking or red, bloody  stools  FOLLOW UP:  If any biopsies were taken you will be contacted by phone or by letter within the next 1-3 weeks. Call [phone]  if you have not heard about the biopsies in 3 weeks.  Please also call with any specific questions about appointments or follow up tests.

## 2017-08-29 NOTE — Anesthesia Postprocedure Evaluation (Signed)
Anesthesia Post Note  Patient: Tanya Leon  Procedure(s) Performed: COLONOSCOPY (N/A ) POLYPECTOMY (N/A )     Patient location during evaluation: PACU Anesthesia Type: MAC Level of consciousness: awake and alert Pain management: pain level controlled Vital Signs Assessment: post-procedure vital signs reviewed and stable Respiratory status: spontaneous breathing, nonlabored ventilation, respiratory function stable and patient connected to nasal cannula oxygen Cardiovascular status: stable and blood pressure returned to baseline Postop Assessment: no apparent nausea or vomiting Anesthetic complications: no    Last Vitals:  Vitals:   08/29/17 1110 08/29/17 1120  BP: 120/72 120/63  Pulse: (!) 52 (!) 54  Resp: 12 10  Temp:    SpO2: 100% 95%    Last Pain:  Vitals:   08/29/17 1055  TempSrc: Oral                 Ryoma Nofziger

## 2017-09-01 ENCOUNTER — Encounter (HOSPITAL_COMMUNITY): Payer: Self-pay | Admitting: Internal Medicine

## 2017-09-03 ENCOUNTER — Other Ambulatory Visit: Payer: Self-pay

## 2017-09-03 MED ORDER — DICLOFENAC SODIUM 75 MG PO TBEC: 75.0000 mg | DELAYED_RELEASE_TABLET | Freq: Two times a day (BID) | ORAL | 0 refills | Status: DC | PRN

## 2017-09-04 DIAGNOSIS — H00014 Hordeolum externum left upper eyelid: Secondary | ICD-10-CM | POA: Diagnosis not present

## 2017-09-04 DIAGNOSIS — H40052 Ocular hypertension, left eye: Secondary | ICD-10-CM | POA: Diagnosis not present

## 2017-09-04 DIAGNOSIS — H401132 Primary open-angle glaucoma, bilateral, moderate stage: Secondary | ICD-10-CM | POA: Diagnosis not present

## 2017-09-04 DIAGNOSIS — H01009 Unspecified blepharitis unspecified eye, unspecified eyelid: Secondary | ICD-10-CM | POA: Diagnosis not present

## 2017-09-17 ENCOUNTER — Ambulatory Visit (INDEPENDENT_AMBULATORY_CARE_PROVIDER_SITE_OTHER): Payer: Medicare Other | Admitting: Podiatry

## 2017-09-17 DIAGNOSIS — B351 Tinea unguium: Secondary | ICD-10-CM

## 2017-09-18 ENCOUNTER — Telehealth: Payer: Self-pay

## 2017-09-18 NOTE — Telephone Encounter (Signed)
Spoke with patient in regards to the condition of her nails. I also discussed this with Dr Jacqualyn Posey. Advised her to continue monitoring her nails, they have shown a great amount of improvement so no more laser treatments are needed at this time. She asked how long she is to use the topical treatment, I told her for a total of 10-12 months.   Refill of [employer] will be written, she is to monitor her nails for and changes and stop topical in April of 2019.

## 2017-09-24 ENCOUNTER — Other Ambulatory Visit: Payer: Self-pay

## 2017-09-24 DIAGNOSIS — H401132 Primary open-angle glaucoma, bilateral, moderate stage: Secondary | ICD-10-CM | POA: Diagnosis not present

## 2017-09-24 DIAGNOSIS — H1013 Acute atopic conjunctivitis, bilateral: Secondary | ICD-10-CM | POA: Diagnosis not present

## 2017-09-24 DIAGNOSIS — H01009 Unspecified blepharitis unspecified eye, unspecified eyelid: Secondary | ICD-10-CM | POA: Diagnosis not present

## 2017-09-24 DIAGNOSIS — H00014 Hordeolum externum left upper eyelid: Secondary | ICD-10-CM | POA: Diagnosis not present

## 2017-09-24 MED ORDER — DICLOFENAC SODIUM 75 MG PO TBEC: 75.0000 mg | DELAYED_RELEASE_TABLET | Freq: Two times a day (BID) | ORAL | 0 refills | Status: DC | PRN

## 2017-09-27 ENCOUNTER — Other Ambulatory Visit: Payer: Self-pay

## 2017-09-27 ENCOUNTER — Encounter (HOSPITAL_COMMUNITY): Payer: Self-pay | Admitting: Emergency Medicine

## 2017-09-27 ENCOUNTER — Ambulatory Visit (HOSPITAL_COMMUNITY)
Admission: EM | Admit: 2017-09-27 | Discharge: 2017-09-27 | Disposition: A | Discharge status: Home / Self Care | Payer: Medicare Other | Attending: Orthopedic Surgery | Admitting: Orthopedic Surgery

## 2017-09-27 DIAGNOSIS — J029 Acute pharyngitis, unspecified: Secondary | ICD-10-CM

## 2017-09-27 MED ORDER — IBUPROFEN 600 MG PO TABS: 600.0000 mg | ORAL_TABLET | Freq: Three times a day (TID) | ORAL | 0 refills | Status: DC

## 2017-09-27 NOTE — ED Triage Notes (Signed)
Pt statrse a week ago shes been taking doxycycline for a cyst in her eye, states she felt a pill get stuck in her throat on the 21st of Dec and ever since then shes had a sore throat. No fevers.

## 2017-09-27 NOTE — Discharge Instructions (Signed)
Please continue with a soft diet.  Make sure you are drinking lots of fluids.  Take ibuprofen 600 mg every 8 hours as needed with food.  If no improvement in 3-4 days, please follow-up with gastroenterologist or ENT physician for further evaluation.  Return to the emergency department for any difficulty swallowing, coughing, worsening symptoms or urgent changes in your health.

## 2017-09-27 NOTE — ED Provider Notes (Signed)
[facility]    CSN: [id number] Arrival date & time: 09/27/17  1205     History   Chief Complaint Chief Complaint  Patient presents with  . Sore Throat    HPI Tanya Leon is a 76 y.o. female presents to the urgent care facility for evaluation of sore throat pain.  Patient states that she had been on doxycycline 100 mg for eyelid infection, on 21 December she took a pill with a sip of water and felt some irritation to her throat.  She also noted some irritation with eating crackers.  Since 21 December she has had irritation and a sore throat that has been worse in the morning and improves as the day goes on.  She has no coughing, congestion, runny nose or nasal drainage.  She denies any trouble swallowing any foods or fluids.  No fevers, chest pain or shortness of breath.  She denies any burning sensation  HPI  Past Medical History:  Diagnosis Date  . Arthritis   . Cancer (HCC)    hx of skin cancer   . Colon polyps    adenomatous  . Complication of anesthesia    " pt stated they gave me too much - kept having to be reminded to breathe   . Constipation    opiod induced associated with back pain  . Depression    hx of   . Diverticulosis   . DIVERTICULOSIS, COLON 01/13/2009  . Headache    history of migraines  . Heart murmur    benign, dagnosed around age 71,prophylactic antibiotic  . Low back pain   . Lumbar spondylolysis   . Osteopenia   . Pneumonia    hx of as a baby   . PVC's (premature ventricular contractions)   . Rectocele   . Tubular adenoma of colon     Patient Active Problem List   Diagnosis Date Noted  . Benign neoplasm of cecum   . History of colonic polyps   . Polyp of colon   . Pulmonary hypertension, unspecified ([place]) 07/01/2017  . Cervical arthritis 06/30/2017  . PVC's (premature ventricular contractions) 03/26/2017  . 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 Thrombocytopenia ([place]) 05/03/2015  . Basal cell carcinoma 07/07/2014  . 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 Glaucoma 06/01/2014  . Depression 06/01/2014  . 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 COLONIC POLYPS 01/13/2009  . 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 SPINAL STENOSIS, LUMBAR 01/25/2008  . INSOMNIA, CHRONIC 07/22/2007  . ALLERGIC RHINITIS 05/21/2007  . Osteopenia 02/26/2007    Past Surgical History:  Procedure Laterality Date  . ABDOMINAL HERNIA REPAIR Right    incisional  . ABDOMINAL HYSTERECTOMY     with ovaries  . BACK SURGERY  2009   L4-L5 with rods  . BLADDER SUSPENSION N/A 04/09/2017   Procedure: TRANSVAGINAL TAPE (TVT) PROCEDURE;  Surgeon: Bobbye Charleston, MD;  Location: [facility];  Service: Gynecology;  Laterality: N/A;  . CATARACT EXTRACTION, BILATERAL    . cataract left lens replacement     2016  . COLONOSCOPY    . COLONOSCOPY N/A 08/29/2017   Procedure: COLONOSCOPY;  Surgeon: Jerene Bears, MD;  Location: [facility];  Service: Gastroenterology;  Laterality: N/A;  . CYSTOCELE REPAIR N/A 04/09/2017   Procedure: ANTERIOR REPAIR (CYSTOCELE);  Surgeon: Bobbye Charleston, MD;  Location: [facility];  Service: Gynecology;  Laterality: N/A;  . CYSTOSCOPY  N/A 04/09/2017   Procedure: CYSTOSCOPY;  Surgeon: Bobbye Charleston, MD;  Location: [facility];  Service: Gynecology;  Laterality: N/A;  . EYE MUSCLE SURGERY    . GLAUCOMA SURGERY Left 05/12/14  . incisional ab  05/28/11  . JOINT REPLACEMENT     right hip replacement   . LUMBAR FUSION     L4-L5  . PARTIAL KNEE ARTHROPLASTY Right   . PARTIAL KNEE ARTHROPLASTY Left   . perforated deverticular abscess/laporotomy and colostomy     depression after this  . POLYPECTOMY N/A 08/29/2017   Procedure: POLYPECTOMY;  Surgeon: Jerene Bears, MD;  Location: [facility];  Service: Gastroenterology;  Laterality: N/A;  . stribismus eye surgery Right   . take down of colostomy    . tonsillectomly    .  TONSILLECTOMY    . TOTAL HIP ARTHROPLASTY Right   . TOTAL KNEE REVISION Left 11/24/2015   Procedure: Conversion from left lateral unicompartmental knee arthroplasty to left total knee arthroplasty;  Surgeon: Mcarthur Rossetti, MD;  Location: [facility];  Service: Orthopedics;  Laterality: Left;    OB History    No data available       Home Medications    Prior to Admission medications   Medication Sig Start Date End Date Taking? Authorizing Provider  AMBULATORY NON FORMULARY MEDICATION Evening Primerose Oil 1300 mg take 1 by mouth twice a day    [provider]  aspirin-acetaminophen-caffeine ([place]) [phone] MG per tablet Take 2 tablets by mouth every 6 (six) hours as needed for headache. Reported on 11/03/2015    [provider]  Azelaic Acid (FINACEA) 15 % cream Apply 1 application topically 2 (two) times daily. After skin is thoroughly washed and patted dry, gently but thoroughly massage a thin film of azelaic acid cream into the affected area twice daily, in the morning and evening.    [provider]  azithromycin (AZASITE) 1 % ophthalmic solution Place 1 drop into both eyes daily. Two weeks each month    [provider]  carboxymethylcellulose (REFRESH PLUS) 0.5 % SOLN Place 1 drop into both eyes daily as needed (Dry eyes.).     [provider]  Cholecalciferol (VITAMIN D) 1000 UNITS capsule Take 2,000 Units by mouth every evening.     [provider]  Ciclopirox 1 % shampoo Apply 1 each topically every Friday.  07/05/11   [provider]  cycloSPORINE (RESTASIS) 0.05 % ophthalmic emulsion Place 1 drop into both eyes 2 (two) times daily.     [provider]  diclofenac (VOLTAREN) 75 MG EC tablet Take 1 tablet (75 mg total) by mouth 2 (two) times daily as needed for moderate pain. 09/24/17   Marin Olp, MD  DULoxetine (CYMBALTA) 60 MG capsule Take 1 capsule (60 mg total) by mouth daily.  10/28/16   Marin Olp, MD  estradiol (VIVELLE-DOT) 0.05 MG/24HR Place 1 patch onto the skin 2 (two) times a week. Reported on 11/03/2015 on thursdays and Sundays    [provider]  fluticasone (FLONASE) 50 MCG/ACT nasal spray SHAKE LIQUID AND USE 2 SPRAYS IN [facility] NOSTRIL DAILY 11/14/16   Marin Olp, MD  ibuprofen (ADVIL,MOTRIN) 600 MG tablet Take 1 tablet (600 mg total) by mouth 3 (three) times daily. 09/27/17   Duanne Guess, PA-C  Lactobacillus (MORE-DOPHILUS ACIDOPHILUS PO) Take 1 capsule by mouth daily.    [provider]  Lutein 20 MG TABS Take 1 tablet by mouth 2 (two)  times daily.     [provider]  LYRICA 25 MG capsule TAKE 1 CAPSULE IN THE MORNING AND TAKE 2 TABLETS IN THE EVENING 05/02/17   Marin Olp, MD  Multiple Vitamins-Minerals (ICAPS AREDS 2) CAPS Take 1 capsule by mouth 2 (two) times daily.     [provider]  Multiple Vitamins-Minerals (MULTIVITAMIN PO) Take 1 tablet by mouth every morning.     [provider]  neomycin-polymyxin b-dexamethasone (MAXITROL) 3.5-10000-0.1 [place] 1 application into both eyes as needed. No more than twice a week 04/25/17   [provider]  NONFORMULARY OR COMPOUNDED ITEM Apply 1-2 g topically daily. [employer] Nail lacquer: Fluconazole 2%, Terbinafine 1%, DMSO 12/19/16   Trula Slade, DPM  olopatadine (PATANOL) 0.1 % ophthalmic solution Place 1 drop into both eyes 2 (two) times daily.    [provider]  omega-3 acid ethyl esters (LOVAZA) 1 G capsule Take 1 g by mouth 2 (two) times daily.     [provider]  polyethylene glycol powder (GLYCOLAX/MIRALAX) powder Take 17 g by mouth every morning.     [provider]  tiZANidine (ZANAFLEX) 2 MG tablet TAKE 1 TABLET (2 MG TOTAL) BY MOUTH AT BEDTIME. 06/24/17   Marin Olp, MD  zolpidem (AMBIEN) 5 MG tablet TAKE 1 TABLET AT BEDTIME AS NEEDED FOR SLEEP 06/24/17   Marin Olp, MD    Family  History Family History  Problem Relation Age of Onset  . Alzheimer's disease Father   . Heart disease Father        mitral valve replaced. unknown reason  . Breast cancer Mother        73  . Colon polyps Mother   . Diabetes Mother   . Heart disease Mother 81       CHF, MI 15  . Heart attack Mother        x 2  . Prostate cancer Brother   . Colon cancer Neg Hx     Social History Social History   Tobacco Use  . Smoking status: Never Smoker  . Smokeless tobacco: Never Used  Substance Use Topics  . Alcohol use: Yes    Alcohol/week: 4.2 oz    Types: 7 Glasses of wine per week    Comment: 1 glass of wine daily   . Drug use: No     Allergies   Shellfish allergy   Review of Systems Review of Systems  Constitutional: Negative for fever.  HENT: Positive for sore throat. Negative for congestion, rhinorrhea, sinus pressure, sinus pain, trouble swallowing and voice change.   Respiratory: Negative for shortness of breath.   Cardiovascular: Negative for chest pain.  Gastrointestinal: Negative for abdominal pain.  Genitourinary: Negative for difficulty urinating, dysuria and urgency.  Musculoskeletal: Negative for back pain and myalgias.  Skin: Negative for rash.  Neurological: Negative for dizziness and headaches.     Physical Exam Triage Vital Signs ED Triage Vitals [09/27/17 1231]  Enc Vitals Group     BP (!) 163/101     Pulse Rate 73     Resp 16     Temp 98.6 F (37 C)     Temp src      SpO2 96 %     Weight      Height      Head Circumference      Peak Flow      Pain Score 5     Pain Loc  Pain Edu?      Excl. in [place]?    No data found.  Updated Vital Signs BP (!) 163/101   Pulse 73   Temp 98.6 F (37 C)   Resp 16   SpO2 96%   Visual Acuity Right Eye Distance:   Left Eye Distance:   Bilateral Distance:    Right Eye Near:   Left Eye Near:    Bilateral Near:     Physical Exam  Constitutional: She is oriented to person, place, and time. She  appears well-developed and well-nourished.  HENT:  Head: Normocephalic and atraumatic.  Right Ear: Hearing normal.  Left Ear: Hearing normal.  Mouth/Throat: Uvula is midline, oropharynx is clear and moist and mucous membranes are normal. Mucous membranes are not pale. No oral lesions. No uvula swelling. No oropharyngeal exudate, posterior oropharyngeal edema, posterior oropharyngeal erythema or tonsillar abscesses. Tonsils are 0 on the right. Tonsils are 0 on the left. No tonsillar exudate.  Cardiovascular: Normal rate.  Pulmonary/Chest: Effort normal and breath sounds normal. No stridor. No respiratory distress. She has no wheezes. She has no rhonchi. She has no rales. She exhibits no tenderness.  Lymphadenopathy:    She has no cervical adenopathy.  Neurological: She is oriented to person, place, and time.  Skin: No rash noted.  Psychiatric: She has a normal mood and affect. Her behavior is normal.     UC Treatments / Results  Labs (all labs ordered are listed, but only abnormal results are displayed) Labs Reviewed - No data to display  EKG  EKG Interpretation None       Radiology No results found.  Procedures Procedures (including critical care time)  Medications Ordered in UC Medications - No data to display   Initial Impression / Assessment and Plan / UC Course  I have reviewed the triage vital signs and the nursing notes.  Pertinent labs & imaging results that were available during my care of the patient were reviewed by me and considered in my medical decision making (see chart for details).     76 year old female with sore throat.  No signs of bacterial infection.  Throat appears normal on exam.  She is tolerating p.o. fluids and foods well.  Irritation is only intermittent, worse in the morning and improves as the day goes on.  She denies any signs and symptoms consistent with GERD.  Recommend trying ibuprofen 600 mg 3 times daily for 3-5 days and then if no  improvement following up with gastroenterologist.  She is educated on signs and symptoms return to clinic for.  Final Clinical Impressions(s) / UC Diagnoses   Final diagnoses:  Sore throat    ED Discharge Orders        Ordered    ibuprofen (ADVIL,MOTRIN) 600 MG tablet  3 times daily     09/27/17 1247        Duanne Guess, [place] 09/27/17 1254

## 2017-10-02 ENCOUNTER — Encounter: Payer: Self-pay | Admitting: Family Medicine

## 2017-10-02 ENCOUNTER — Ambulatory Visit (INDEPENDENT_AMBULATORY_CARE_PROVIDER_SITE_OTHER): Payer: Medicare Other | Admitting: Family Medicine

## 2017-10-02 VITALS — BP 142/80 | HR 61 | Temp 98.1°F | Ht 62.0 in | Wt 143.0 lb

## 2017-10-02 DIAGNOSIS — R269 Unspecified abnormalities of gait and mobility: Secondary | ICD-10-CM

## 2017-10-02 DIAGNOSIS — R5383 Other fatigue: Secondary | ICD-10-CM | POA: Diagnosis not present

## 2017-10-02 DIAGNOSIS — R293 Abnormal posture: Secondary | ICD-10-CM | POA: Diagnosis not present

## 2017-10-02 DIAGNOSIS — D696 Thrombocytopenia, unspecified: Secondary | ICD-10-CM | POA: Diagnosis not present

## 2017-10-02 DIAGNOSIS — J029 Acute pharyngitis, unspecified: Secondary | ICD-10-CM | POA: Diagnosis not present

## 2017-10-02 LAB — CBC WITH DIFFERENTIAL/PLATELET
Basophils Absolute: 0 10*3/uL (ref 0.0–0.1)
Basophils Relative: 0.9 % (ref 0.0–3.0)
EOS ABS: 0.1 10*3/uL (ref 0.0–0.7)
Eosinophils Relative: 2.7 % (ref 0.0–5.0)
HCT: 40.2 % (ref 36.0–46.0)
Hemoglobin: 13.4 g/dL (ref 12.0–15.0)
LYMPHS ABS: 1.3 10*3/uL (ref 0.7–4.0)
Lymphocytes Relative: 26.6 % (ref 12.0–46.0)
MCHC: 33.5 g/dL (ref 30.0–36.0)
MCV: 93.2 fl (ref 78.0–100.0)
MONO ABS: 0.4 10*3/uL (ref 0.1–1.0)
Monocytes Relative: 8.6 % (ref 3.0–12.0)
NEUTROS ABS: 3 10*3/uL (ref 1.4–7.7)
NEUTROS PCT: 61.2 % (ref 43.0–77.0)
PLATELETS: 144 10*3/uL — AB (ref 150.0–400.0)
RBC: 4.31 Mil/uL (ref 3.87–5.11)
RDW: 14 % (ref 11.5–15.5)
WBC: 4.9 10*3/uL (ref 4.0–10.5)

## 2017-10-02 LAB — COMPREHENSIVE METABOLIC PANEL
ALT: 13 U/L (ref 0–35)
AST: 17 U/L (ref 0–37)
Albumin: 4.2 g/dL (ref 3.5–5.2)
Alkaline Phosphatase: 60 U/L (ref 39–117)
BILIRUBIN TOTAL: 0.6 mg/dL (ref 0.2–1.2)
BUN: 23 mg/dL (ref 6–23)
CO2: 29 mEq/L (ref 19–32)
CREATININE: 0.95 mg/dL (ref 0.40–1.20)
Calcium: 9 mg/dL (ref 8.4–10.5)
Chloride: 103 mEq/L (ref 96–112)
GFR: 60.5 mL/min (ref >60.00)
GLUCOSE: 89 mg/dL (ref 70–99)
Potassium: 4.5 mEq/L (ref 3.5–5.1)
SODIUM: 137 meq/L (ref 135–145)
TOTAL PROTEIN: 6.6 g/dL (ref 6.0–8.3)

## 2017-10-02 LAB — TSH: TSH: 2.65 u[IU]/mL (ref 0.35–4.50)

## 2017-10-02 NOTE — Patient Instructions (Addendum)
Lets try soft foods for another 1-2 weeks If symptoms not improving- lets add a low to moderate dose prednisone If symptoms not improving with prednisone- refer to ENT likely  If you were to have progressive trouble swallowing contact us immediately  For fatigue= check labs Please stop by lab before you go  For posture issues and gait concern- refer to Dr. Paulla Fore of sports medicine- you can schedule this before you leave

## 2017-10-02 NOTE — Progress Notes (Signed)
Subjective:  Tanya Leon is a 77 y.o. year old very pleasant female patient who presents for/with See problem oriented charting ROS- no fever, chills, nausea, vomiting.    Past Medical History-  Patient Active Problem List   Diagnosis Date Noted  . PVC's (premature ventricular contractions) 03/26/2017    Priority: Medium  . Basal cell carcinoma 07/07/2014    Priority: Medium  . Depression 06/01/2014    Priority: Medium  . SPINAL STENOSIS, LUMBAR 01/25/2008    Priority: Medium  . INSOMNIA, CHRONIC 07/22/2007    Priority: Medium  . Thrombocytopenia ([place]) 05/03/2015    Priority: Low  . 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 Glaucoma 06/01/2014    Priority: Low  . 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 COLONIC POLYPS 01/13/2009    Priority: Low  . 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 ALLERGIC RHINITIS 05/21/2007    Priority: Low  . Osteopenia 02/26/2007    Priority: Low  . Benign neoplasm of cecum   . History of colonic polyps   . Polyp of colon   . Pulmonary hypertension, unspecified ([place]) 07/01/2017  . Cervical arthritis 06/30/2017  . 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 AMBULATORY NON FORMULARY MEDICATION Evening Primerose Oil 1300 mg take 1 by mouth twice a day    . aspirin-acetaminophen-caffeine (EXCEDRIN EXTRA STRENGTH) 250-250-65 MG per tablet Take 2 tablets by mouth every 6 (six) hours as needed for headache. Reported on 11/03/2015    . Azelaic Acid (FINACEA) 15 % cream Apply 1 application topically 2 (two) times daily. After skin is thoroughly washed and patted dry, gently but thoroughly massage a thin film of azelaic acid cream into the affected area twice daily, in the morning and evening.    Marland Kitchen azithromycin  (AZASITE) 1 % ophthalmic solution Place 1 drop into both eyes daily. Two weeks each month    . carboxymethylcellulose (REFRESH PLUS) 0.5 % SOLN Place 1 drop into both eyes daily as needed (Dry eyes.).     Marland Kitchen Cholecalciferol (VITAMIN D) 1000 UNITS capsule Take 2,000 Units by mouth every evening.     . Ciclopirox 1 % shampoo Apply 1 each topically every Friday.     . cycloSPORINE (RESTASIS) 0.05 % ophthalmic emulsion Place 1 drop into both eyes 2 (two) times daily.     . diclofenac (VOLTAREN) 75 MG EC tablet Take 1 tablet (75 mg total) by mouth 2 (two) times daily as needed for moderate pain. 60 tablet 0  . DULoxetine (CYMBALTA) 60 MG capsule Take 1 capsule (60 mg total) by mouth daily. 90 capsule 3  . estradiol (VIVELLE-DOT) 0.05 MG/24HR Place 1 patch onto the skin 2 (two) times a week. Reported on 11/03/2015 on thursdays and Sundays    . fluticasone (FLONASE) 50 MCG/ACT nasal spray SHAKE LIQUID AND USE 2 SPRAYS IN EACH NOSTRIL DAILY 48 g 3  . ibuprofen (ADVIL,MOTRIN) 600 MG tablet Take 1 tablet (600 mg total) by mouth 3 (three) times daily. 15 tablet 0  . Lactobacillus (MORE-DOPHILUS ACIDOPHILUS PO) Take 1 capsule by mouth daily.    . Lutein 20 MG TABS Take 1 tablet by mouth 2 (two) times daily.     Marland Kitchen LYRICA 25 MG capsule TAKE 1 CAPSULE IN THE  MORNING AND TAKE 2 TABLETS IN THE EVENING 270 capsule 1  . Multiple Vitamins-Minerals (ICAPS AREDS 2) CAPS Take 1 capsule by mouth 2 (two) times daily.     . Multiple Vitamins-Minerals (MULTIVITAMIN PO) Take 1 tablet by mouth every morning.     . neomycin-polymyxin b-dexamethasone (MAXITROL) 3.5-10000-0.1 OINT Place 1 application into both eyes as needed. No more than twice a week  0  . NONFORMULARY OR COMPOUNDED ITEM Apply 1-2 g topically daily. [employer] Nail lacquer: Fluconazole 2%, Terbinafine 1%, DMSO 120 each 11  . olopatadine (PATANOL) 0.1 % ophthalmic solution Place 1 drop into both eyes 2 (two) times daily.    Marland Kitchen omega-3 acid ethyl esters (LOVAZA) 1 G  capsule Take 1 g by mouth 2 (two) times daily.     . polyethylene glycol powder (GLYCOLAX/MIRALAX) powder Take 17 g by mouth every morning.     Marland Kitchen tiZANidine (ZANAFLEX) 2 MG tablet TAKE 1 TABLET (2 MG TOTAL) BY MOUTH AT BEDTIME. 90 tablet 1  . zolpidem (AMBIEN) 5 MG tablet TAKE 1 TABLET AT BEDTIME AS NEEDED FOR SLEEP 30 tablet 5   Objective: BP (!) 142/80   Pulse 61   Temp 98.1 F (36.7 C) (Oral)   Ht 5\' 2"  (1.575 m)   Wt 143 lb (64.9 kg)   SpO2 95%   BMI 26.16 kg/m  Gen: NAD, resting comfortably Mild erythema around left eyelid- recent procedure with optho oropharynx is normal. Turbinates normal. No lymphadenopathy CV: RRR no murmurs rubs or gallops Lungs: CTAB no crackles, wheeze, rhonchi Ext: no edema Skin: warm, dry Gait: walks with right shoulder down compared to right, hips appear rather stiff and swings legs only.  Neuro: no cogwheel rigidity, no masked facies   Assessment/Plan:  Sore throat, fatigue, thrombocytopenia S: Stye 6 weeks ago was healing up but had to be lanced- Through Dr. Herbert Deaner. Was placed on doxycycline. 5 days once a day, then twice a day for 13 more days.   Two weeks ago with swallowing pill- took small sip of sprite- felt hung there. Then tried to swallow with crackers and felt more sore. Seemed to get slightly worse daily. Went to urgent care on 09/27/17 and advised ibuprofen 600mg  TID for 3-5 days.  Since Saturday tried to eat really soft foods and seemed help some then had salad last night and felt bad again after that.   No cough, cold, congestion recently. Never has trouble swallowing pills. No trouble with pills, food, or water- no feelings of food getting stuck. Has been more tired than usual with this. A/P: for fatigue- checked CBC/cmp/tsh which were largely normal except for mild thrombocytopenia which is not worsening  For sore throat portion- possible abrasion in pharynx- improves with soft foods so advised more prolonged trial. She is on  voltaren at home at baseline BID and wanst to return to this over ibuprofen which is ok. We discussed trial prednisone as antiinflammatory low dose and she will consider if not improving. If fails to improve with that refer to ENT- doubt she would need full EGD at level she complains of pain - also monitor BP but was trending down before she left today  Gait abnormality - Plan: Ambulatory referral to Sports Medicine Poor posture - Plan: Ambulatory referral to Sports Medicine S: also complains of poor posture and slumping over when she feels fatigued. Also has tried to work with PT for her gait issues- apparently gait rather stiff and they have tried to work on her  walking mechanics without success A/P: Patient seems to have slight droop of right shoulder when walking as well as hips that are rather stiff- gait not shuffling, no cogwheel rigidity and doubt parkinsons. No obvious clear cause but we did agree to referral to Dr. Paulla Fore for further evaluation.   Future Appointments  Date Time Provider [place]  10/06/2017  2:00 PM Gerda Diss, DO [facility]  12/30/2017  2:15 PM Marin Olp, MD [facility]  07/01/2018  8:00 AM Williemae Area, RN [facility]  07/01/2018  9:30 AM Marin Olp, MD [facility]  07/08/2018 10:30 AM [id number]   Sore throat - Plan: Comprehensive metabolic panel, CBC with Differential/Platelet  Gait abnormality - Plan: Ambulatory referral to Sports Medicine  Poor posture - Plan: Ambulatory referral to Sports Medicine  Fatigue, unspecified type - Plan: Comprehensive metabolic panel, TSH, CBC with Differential/Platelet  Thrombocytopenia ([place])  Return precautions advised.  Garret Reddish, MD

## 2017-10-03 ENCOUNTER — Other Ambulatory Visit: Payer: Self-pay

## 2017-10-03 MED ORDER — DULOXETINE HCL 60 MG PO CPEP: 60.0000 mg | ORAL_CAPSULE | Freq: Every day | ORAL | 3 refills | Status: DC

## 2017-10-06 ENCOUNTER — Encounter: Payer: Self-pay | Admitting: Sports Medicine

## 2017-10-06 ENCOUNTER — Ambulatory Visit (INDEPENDENT_AMBULATORY_CARE_PROVIDER_SITE_OTHER): Payer: Medicare Other

## 2017-10-06 ENCOUNTER — Ambulatory Visit (INDEPENDENT_AMBULATORY_CARE_PROVIDER_SITE_OTHER): Payer: Medicare Other | Admitting: Sports Medicine

## 2017-10-06 VITALS — BP 140/80 | HR 68 | Ht 62.0 in | Wt 141.8 lb

## 2017-10-06 DIAGNOSIS — M5136 Other intervertebral disc degeneration, lumbar region: Secondary | ICD-10-CM | POA: Diagnosis not present

## 2017-10-06 DIAGNOSIS — M545 Low back pain, unspecified: Secondary | ICD-10-CM

## 2017-10-06 DIAGNOSIS — M48061 Spinal stenosis, lumbar region without neurogenic claudication: Secondary | ICD-10-CM

## 2017-10-06 DIAGNOSIS — M961 Postlaminectomy syndrome, not elsewhere classified: Secondary | ICD-10-CM | POA: Diagnosis not present

## 2017-10-06 NOTE — Progress Notes (Signed)
Tanya Leon. Tanya Leon, [facility] [place]  Tanya Leon - 77 y.o. female MRN [id number]  Date of birth: 1941-04-27  Visit Date: 10/06/2017  PCP: Marin Olp, MD   Referred by: Marin Olp, MD   Scribe for today's visit: Wendy Poet, LAT, ATC     SUBJECTIVE:  Tanya Leon is here for New Patient (Initial Visit) (gait abnormality) .  Referred by: Dr. Yong Channel Her gait problems  INITIALLY: Began approximately 9 months ago w/ no MOI Described as moderate pulling-type pain, nonradiating Worsened with prolonged walking and standing Improved with stretching Additional associated symptoms include: no N/T and no radiating pain    At this time symptoms are worsening compared to onset due to increased frequency.  She also has noticed an increased forward lean when she walks. She has been doing self-stretches.  Went to PT x 3 weeks in 2018.  Used to be able to walk x 3 miles but now can only walk x 1 mile before L4-5 fusion in 2009 Dr. Trenton Gammon B TKAs (2011) and R THA  (2009)   ROS Denies night time disturbances. Denies fevers, chills, or night sweats. Denies unexplained weight loss. Reports personal history of cancer. Denies changes in bowel or bladder habits. Denies recent unreported falls. Reports new or worsening dyspnea or wheezing. SOB w/ increased physical activity. Denies headaches or dizziness.  Denies numbness, tingling or weakness  In the extremities.  Denies dizziness or presyncopal episodes Denies lower extremity edema     HISTORY & PERTINENT PRIOR DATA:  Prior History reviewed and updated per electronic medical record.  Significant history, findings, studies and interim changes include:  reports that  has never smoked. she has never used smokeless tobacco. No results for input(s): HGBA1C, LABURIC, CREATINE in the last 8760 hours. No specialty comments available. No problems updated.  OBJECTIVE:    VS:  HT:5\' 2"  (157.5 cm)   WT:141 lb 12.8 oz (64.3 kg)  BMI:25.93    BP:140/80  HR:68bpm  TEMP: ( )  RESP:96 %   PHYSICAL EXAM: Constitutional: WDWN, Non-toxic appearing. Psychiatric: Alert & appropriately interactive. Not depressed or anxious appearing. Respiratory: No increased work of breathing. Trachea Midline Eyes: Pupils are equal. EOM intact without nystagmus. No scleral icterus Cardiovascular:  Peripheral Pulses: peripheral pulses symmetrical No clubbing or cyanosis appreciated Capillary Refill is normal, less than 2 seconds No signficant generalized edema/anasarca Sensory Exam: intact to light touch  She has marked pain across the low back.  Pain is worse with flexion and extension.  She has difficulty with side bending and rotation as well but less pain.  Lower extremity sensation is intact however she has generalized esthesia in the lower extremities.  Lower extremity reflexes are symmetric and diminished.   Findings:  Status post PLIF at L4-5 in 2009. Bilateral knee arthroplasties, partial left Right hip replacement   Bilateral left worse than right transverse arch breakdown.  Genu valgus.  Hip flexor tightness with crouched forward gait.      ASSESSMENT & PLAN:   1. SPINAL STENOSIS, LUMBAR   2. 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  This was not read on the x-ray however findings are convincing for windshield wipering effect of the screws.  I believe this is contributing to the majority of her symptoms and needs  to be reevaluated by neurosurgery.  Referral placed today back to her prior provider and will defer further advanced imaging to their expertise.  She does seem to be worse with ambulation which does correlate with loosening hardware.  Follow-up with neurosurgery recommended.  ++++++++++++++++++++++++++++++++++++++++++++ Orders & Meds: Orders Placed  This Encounter  Procedures  . DG Lumbar Spine Complete  . Ambulatory referral to Neurosurgery    No orders of the defined types were placed in this encounter.   ++++++++++++++++++++++++++++++++++++++++++++ Follow-up: No Follow-up on file.   Pertinent documentation may be included in additional procedure notes, imaging studies, problem based documentation and patient instructions. Please see these sections of the encounter for additional information regarding this visit. CMA/ATC served as Education administrator during this visit. History, Physical, and Plan performed by medical provider. Documentation and orders reviewed and attested to.      Gerda Diss, [facility] Physician

## 2017-10-06 NOTE — Progress Notes (Signed)
I have to disagree with the radiologist's interpretation of this given the significant radiolucency around the inferior screws of her fusion I am concerned that she is having loosening of the screws.  She obviously has significant underlying spondylosis but given the significant worsening in her axial low back pain further evaluation needs to be completed.

## 2017-10-07 DIAGNOSIS — L309 Dermatitis, unspecified: Secondary | ICD-10-CM | POA: Diagnosis not present

## 2017-10-07 NOTE — Progress Notes (Signed)
Pt presents with mycotic infection of nails 1-5 bilateral  All other systems are negative  Laser therapy administered to affected nails and tolerated well. All safety precautions were in place. Re-appointed prn

## 2017-10-08 DIAGNOSIS — H00014 Hordeolum externum left upper eyelid: Secondary | ICD-10-CM | POA: Diagnosis not present

## 2017-10-08 DIAGNOSIS — H401132 Primary open-angle glaucoma, bilateral, moderate stage: Secondary | ICD-10-CM | POA: Diagnosis not present

## 2017-10-08 DIAGNOSIS — H1013 Acute atopic conjunctivitis, bilateral: Secondary | ICD-10-CM | POA: Diagnosis not present

## 2017-10-08 DIAGNOSIS — H01009 Unspecified blepharitis unspecified eye, unspecified eyelid: Secondary | ICD-10-CM | POA: Diagnosis not present

## 2017-10-14 DIAGNOSIS — H401132 Primary open-angle glaucoma, bilateral, moderate stage: Secondary | ICD-10-CM | POA: Diagnosis not present

## 2017-10-14 DIAGNOSIS — H01009 Unspecified blepharitis unspecified eye, unspecified eyelid: Secondary | ICD-10-CM | POA: Diagnosis not present

## 2017-10-14 DIAGNOSIS — H1013 Acute atopic conjunctivitis, bilateral: Secondary | ICD-10-CM | POA: Diagnosis not present

## 2017-10-14 DIAGNOSIS — H00014 Hordeolum externum left upper eyelid: Secondary | ICD-10-CM | POA: Diagnosis not present

## 2017-10-16 ENCOUNTER — Encounter: Payer: Self-pay | Admitting: Sports Medicine

## 2017-10-17 ENCOUNTER — Other Ambulatory Visit: Payer: Self-pay

## 2017-10-17 MED ORDER — DICLOFENAC SODIUM 75 MG PO TBEC: 75.0000 mg | DELAYED_RELEASE_TABLET | Freq: Two times a day (BID) | ORAL | 0 refills | Status: DC | PRN

## 2017-10-23 DIAGNOSIS — M48062 Spinal stenosis, lumbar region with neurogenic claudication: Secondary | ICD-10-CM | POA: Diagnosis not present

## 2017-10-24 MED FILL — SHINGRIX 50 MCG SUS: 50 | 1 days supply | Qty: 1 | Fill #0

## 2017-10-30 DIAGNOSIS — I10 Essential (primary) hypertension: Secondary | ICD-10-CM | POA: Diagnosis not present

## 2017-10-30 DIAGNOSIS — M5136 Other intervertebral disc degeneration, lumbar region: Secondary | ICD-10-CM | POA: Diagnosis not present

## 2017-10-30 DIAGNOSIS — Z6825 Body mass index (BMI) 25.0-25.9, adult: Secondary | ICD-10-CM | POA: Diagnosis not present

## 2017-10-30 DIAGNOSIS — M5126 Other intervertebral disc displacement, lumbar region: Secondary | ICD-10-CM | POA: Diagnosis not present

## 2017-10-30 DIAGNOSIS — M48062 Spinal stenosis, lumbar region with neurogenic claudication: Secondary | ICD-10-CM | POA: Diagnosis not present

## 2017-10-31 ENCOUNTER — Other Ambulatory Visit: Payer: Self-pay

## 2017-10-31 MED ORDER — FLUTICASONE PROPIONATE 50 MCG/ACT NA SUSP: NASAL | 3 refills | Status: DC

## 2017-11-06 ENCOUNTER — Encounter (HOSPITAL_COMMUNITY): Payer: Self-pay | Admitting: Emergency Medicine

## 2017-11-06 ENCOUNTER — Ambulatory Visit (HOSPITAL_COMMUNITY)
Admission: EM | Admit: 2017-11-06 | Discharge: 2017-11-06 | Disposition: A | Discharge status: Home / Self Care | Payer: Medicare Other | Attending: Family Medicine | Admitting: Family Medicine

## 2017-11-06 ENCOUNTER — Ambulatory Visit (INDEPENDENT_AMBULATORY_CARE_PROVIDER_SITE_OTHER): Payer: Medicare Other

## 2017-11-06 ENCOUNTER — Other Ambulatory Visit: Payer: Self-pay

## 2017-11-06 DIAGNOSIS — S52572A Other intraarticular fracture of lower end of left radius, initial encounter for closed fracture: Secondary | ICD-10-CM | POA: Diagnosis not present

## 2017-11-06 DIAGNOSIS — S52592A Other fractures of lower end of left radius, initial encounter for closed fracture: Secondary | ICD-10-CM | POA: Diagnosis not present

## 2017-11-06 DIAGNOSIS — M25532 Pain in left wrist: Secondary | ICD-10-CM | POA: Diagnosis not present

## 2017-11-06 DIAGNOSIS — W19XXXD Unspecified fall, subsequent encounter: Secondary | ICD-10-CM | POA: Diagnosis not present

## 2017-11-06 MED ORDER — IBUPROFEN 800 MG PO TABS
800.0000 mg | ORAL_TABLET | Freq: Once | ORAL | Status: AC
  Administered 2017-11-06: 800 mg via ORAL

## 2017-11-06 MED ORDER — IBUPROFEN 800 MG PO TABS
ORAL_TABLET | ORAL | Status: AC
Start: 2017-11-06 — End: ?
  Filled 2017-11-06: qty 1

## 2017-11-06 MED ORDER — HYDROCODONE-ACETAMINOPHEN 5-325 MG PO TABS: 1.0000 | ORAL_TABLET | Freq: Four times a day (QID) | ORAL | 0 refills | Status: DC | PRN

## 2017-11-06 NOTE — Progress Notes (Signed)
Orthopedic Tech Progress Note Patient Details:  Tanya Leon 29-May-1941 [id number]  Ortho Devices Type of Ortho Device: Arm sling, Rad Gutter splint Ortho Device/Splint Location: lue Ortho Device/Splint Interventions: Application   Post Interventions Patient Tolerated: Well Instructions Provided: Care of device   Hildred Priest 11/06/2017, 3:21 PM

## 2017-11-06 NOTE — Discharge Instructions (Addendum)
Please rest, ice and elevate the affected extremity. You may take Motrin 600-800mg  every 8 hours, as needed for pain (take with food). Follow up with orthopaedic surgery within one week for further evaluation. Please call for any appointment. Do not remove your splint. You may use a garbage bag while showering to keep your splint dry. Please return here if you are experiencing increased pain, tingling/numbness, swelling, redness, or fever.  Be aware, pain medications may cause drowsiness. Please do not drive, operate heavy machinery or make important decisions while on this medication, it can cloud your judgement.

## 2017-11-06 NOTE — ED Notes (Signed)
Pt ambulated out of UC with husband

## 2017-11-06 NOTE — ED Triage Notes (Signed)
Patient was in an aerobics class today and part of clas was backing up, in backing up she stumbled and fell catching self with left hand.  Pain in left wrist.  Left radial pulse 2 plus.  Able to move fingers and brisk cap refill to fingernails on left hand.

## 2017-11-06 NOTE — ED Notes (Signed)
Ortho will be here asap, have two people in front of patient.

## 2017-11-06 NOTE — ED Notes (Signed)
Ortho at bedside.

## 2017-11-06 NOTE — ED Notes (Signed)
Ortho paged again, stated he was on his way

## 2017-11-06 NOTE — ED Notes (Signed)
Paged ortho 

## 2017-11-06 NOTE — ED Provider Notes (Signed)
[facility]   [id number] 11/06/17 Arrival Time: 1206  ASSESSMENT & PLAN:  1. Left wrist pain   2. Other closed intra-articular fracture of distal end of left radius, initial encounter    Imaging: Dg Wrist Complete Left  Result Date: 11/06/2017 CLINICAL DATA:  Status post fall with trauma to the left wrist. EXAM: LEFT WRIST - COMPLETE 3+ VIEW COMPARISON:  None. FINDINGS: There is a mildly comminuted triplane intra-articular fracture of the distal radius with mild impaction and ventral displacement of the distal fracture fragment. The carpal rows are preserved. Associated soft tissue swelling. Extensive osteoarthritic changes at the first carpometacarpal joint. IMPRESSION: Mildly comminuted triplane impacted intra-articular fracture of the distal radius. Electronically Signed   By: Fidela Salisbury M.D.   On: 11/06/2017 13:38   Meds ordered this encounter  Medications  . HYDROcodone-acetaminophen (NORCO/VICODIN) 5-325 MG tablet    Sig: Take 1 tablet by mouth every 6 (six) hours as needed for moderate pain or severe pain.    Dispense:  10 tablet    Refill:  0   Splint applied by orthopaedic tech. OTC analgesics as needed. Written information on fracture and splint care given. To arrange orthopaedic follow up within one week. May f/u here as needed.  Reviewed expectations re: course of current medical issues. Questions answered. Outlined signs and symptoms indicating need for more acute intervention. Patient verbalized understanding. After Visit Summary given.  SUBJECTIVE: History from: patient. VERCIE POKORNY is a 77 y.o. female who reports persistent pain of her left wrist with mild swelling. Onset abrupt beginning today. Injury/trama: yes, walking backward at aerobics class, tripped and landed on outstretched hand. Discomfort described as aching and throbbing without radiation. Extremity sensation changes or weakness: none. Self treatment: has not tried OTCs for relief of  pain. She is right handed.  ROS: As per HPI.   OBJECTIVE:  Vitals:   11/06/17 1311  BP: (!) 149/73  Pulse: (!) 58  Resp: 18  Temp: 98.2 F (36.8 C)  TempSrc: Oral  SpO2: 98%    General appearance: alert; no distress Extremities: no cyanosis or edema; symmetrical with no gross deformities; tenderness over her leftradial wrist with mild swelling and no bruising; ROM: limited by pain CV: normal extremity capillary refill Skin: warm and dry Neurologic: normal gait; normal symmetric reflexes in all extremities; normal sensation in all extremities Psychological: alert and cooperative; normal mood and affect   Allergies  Allergen Reactions  . Shellfish Allergy Swelling    Past Medical History:  Diagnosis Date  . Arthritis   . Cancer (HCC)    hx of skin cancer   . Colon polyps    adenomatous  . Complication of anesthesia    " pt stated they gave me too much - kept having to be reminded to breathe   . Constipation    opiod induced associated with back pain  . Depression    hx of   . Diverticulosis   . DIVERTICULOSIS, COLON 01/13/2009  . Headache    history of migraines  . Heart murmur    benign, dagnosed around age 77,prophylactic antibiotic  . Low back pain   . Lumbar spondylolysis   . Osteopenia   . Pneumonia    hx of as a baby   . PVC's (premature ventricular contractions)   . Rectocele   . Tubular adenoma of colon    Social History   Socioeconomic History  . Marital status: Married    Spouse name: Not on  file  . Number of children: 2  . Years of education: Not on file  . Highest education level: Not on file  Social Needs  . Financial resource strain: Not on file  . Food insecurity - worry: Not on file  . Food insecurity - inability: Not on file  . Transportation needs - medical: Not on file  . Transportation needs - non-medical: Not on file  Occupational History  . Occupation: cpa/retired    Fish farm manager: Retired  Tobacco Use  . Smoking status: Never  Smoker  . Smokeless tobacco: Never Used  Substance and Sexual Activity  . Alcohol use: Yes    Alcohol/week: 4.2 oz    Types: 7 Glasses of wine per week    Comment: 1 glass of wine daily   . Drug use: No  . Sexual activity: Yes    Birth control/protection: Post-menopausal  Other Topics Concern  . Not on file  Social History Narrative   Married 65 years in 2015. 2 kids. 4 grandkids.       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 Previously [employer].    Family History  Problem Relation Age of Onset  . Alzheimer's disease Father   . Heart disease Father        mitral valve replaced. unknown reason  . Breast cancer Mother        19  . Colon polyps Mother   . Diabetes Mother   . Heart disease Mother 47       CHF, MI 27  . Heart attack Mother        x 2  . Prostate cancer Brother   . Colon cancer Neg Hx    Past Surgical History:  Procedure Laterality Date  . ABDOMINAL HERNIA REPAIR Right    incisional  . ABDOMINAL HYSTERECTOMY     with ovaries  . BACK SURGERY  2009   L4-L5 with rods  . BLADDER SUSPENSION N/A 04/09/2017   Procedure: TRANSVAGINAL TAPE (TVT) PROCEDURE;  Surgeon: Bobbye Charleston, MD;  Location: [facility];  Service: Gynecology;  Laterality: N/A;  . CATARACT EXTRACTION, BILATERAL    . cataract left lens replacement     2016  . COLONOSCOPY    . COLONOSCOPY N/A 08/29/2017   Procedure: COLONOSCOPY;  Surgeon: Jerene Bears, MD;  Location: [facility];  Service: Gastroenterology;  Laterality: N/A;  . CYSTOCELE REPAIR N/A 04/09/2017   Procedure: ANTERIOR REPAIR (CYSTOCELE);  Surgeon: Bobbye Charleston, MD;  Location: [facility];  Service: Gynecology;  Laterality: N/A;  . CYSTOSCOPY N/A 04/09/2017   Procedure: CYSTOSCOPY;  Surgeon: Bobbye Charleston, MD;  Location: [facility];  Service: Gynecology;  Laterality: N/A;  . EYE MUSCLE SURGERY    . GLAUCOMA SURGERY Left 05/12/14  . incisional ab  05/28/11  . JOINT REPLACEMENT      right hip replacement   . LUMBAR FUSION     L4-L5  . PARTIAL KNEE ARTHROPLASTY Right   . PARTIAL KNEE ARTHROPLASTY Left   . perforated deverticular abscess/laporotomy and colostomy     depression after this  . POLYPECTOMY N/A 08/29/2017   Procedure: POLYPECTOMY;  Surgeon: Jerene Bears, MD;  Location: [facility];  Service: Gastroenterology;  Laterality: N/A;  . stribismus eye surgery Right   . take down of colostomy    . tonsillectomly    . TONSILLECTOMY    . TOTAL HIP ARTHROPLASTY Right   . TOTAL KNEE REVISION Left 11/24/2015  Procedure: Conversion from left lateral unicompartmental knee arthroplasty to left total knee arthroplasty;  Surgeon: Mcarthur Rossetti, MD;  Location: [facility];  Service: Orthopedics;  Laterality: Left;      Vanessa Kick, MD 11/10/17 [phone]

## 2017-11-10 ENCOUNTER — Ambulatory Visit (INDEPENDENT_AMBULATORY_CARE_PROVIDER_SITE_OTHER): Payer: Medicare Other | Admitting: Physician Assistant

## 2017-11-10 ENCOUNTER — Encounter (INDEPENDENT_AMBULATORY_CARE_PROVIDER_SITE_OTHER): Payer: Self-pay | Admitting: Physician Assistant

## 2017-11-10 DIAGNOSIS — S52572A Other intraarticular fracture of lower end of left radius, initial encounter for closed fracture: Secondary | ICD-10-CM | POA: Diagnosis not present

## 2017-11-10 NOTE — Progress Notes (Signed)
Office Visit Note   Patient: Tanya Leon           Date of Birth: 06/08/1941           MRN: [id number] Visit Date: 11/10/2017              Requested by: Marin Olp, MD [place], [place] [address] PCP: Marin Olp, MD   Assessment & Plan: Visit Diagnoses:  1. Other intraarticular fracture of lower end of left radius, initial encounter for closed fracture     Plan: Due to the volar angulation and overall comminuted deformity of the fracture recommend open reduction internal fixation of the left distal radius fracture.  Questions were encouraged and answered by Dr. Ninfa Linden myself.  We will see her back 2 weeks postop. Model showing the plate on a distal radius was shown to the patient by Dr. Ninfa Linden surgery was discussed at length.  Follow-Up Instructions: Return in about 2 weeks (around 11/24/2017).   Orders:  No orders of the defined types were placed in this encounter.  No orders of the defined types were placed in this encounter.     Procedures: No procedures performed   Clinical Data: No additional findings.   Subjective: Chief Complaint  Patient presents with  . Left Wrist - Fracture    HPI Tanya Leon is a 77 year old female well-known to Dr. Ninfa Linden service.  Unfortunately she was at aerobic class last Thursday and was going backwards and fell on an outstretched hand.  She was seen in the ER and found to have a distal radius fracture.  She is placed in a splint and sent here today for follow-up.  She denies any other injuries.  She is having minimal pain.  Radiographs left wrist dated 11/06/2017 are reviewed and show a triplane impacted mildly comminuted fracture of the distal radius.  There is some in chart articular involvement.  No other fractures seen.  Significant CMC joint arthritic changes noted. Review of Systems No fevers, chills, dizziness, chest pain, head trauma or nausea/vomiting.  Objective: Vital Signs: There were  no vitals taken for this visit.  Physical Exam  Constitutional: She is oriented to person, place, and time. She appears well-developed and well-nourished. No distress.  Pulmonary/Chest: Effort normal.  Neurological: She is alert and oriented to person, place, and time.  Skin: She is not diaphoretic.  Psychiatric: She has a normal mood and affect.    Ortho Exam Left arm she has significant bruising over the anterior aspect of the proximal forearm.  No tenderness about the elbow she has good range of motion the elbow without pain.  She is able to wiggle her fingers except for the second and third which are incorporated into the cast.  Nailbeds are pink Refill less than 2 seconds.  Sensation grossly intact throughout the fingers.  Splint is clean dry and intact. Specialty Comments:  No specialty comments available.  Imaging: No results found.   PMFS History: Patient Active Problem List   Diagnosis Date Noted  . 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 Benign neoplasm of cecum   . History of colonic polyps   . Polyp of colon   . Pulmonary hypertension, unspecified ([place]) 07/01/2017  . Cervical arthritis 06/30/2017  . PVC's (premature ventricular contractions) 03/26/2017  . 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 Status post total left knee replacement 11/24/2015  . Thrombocytopenia ([place]) 05/03/2015  . Basal cell carcinoma 07/07/2014  .  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 Glaucoma 06/01/2014  . Depression 06/01/2014  . 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 COLONIC POLYPS 01/13/2009  . 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 SPINAL STENOSIS, LUMBAR 01/25/2008  . INSOMNIA, CHRONIC 07/22/2007  . ALLERGIC RHINITIS 05/21/2007  . Osteopenia 02/26/2007   Past Medical History:  Diagnosis Date  . Arthritis   . Cancer (HCC)    hx of skin cancer   . Colon polyps    adenomatous  . Complication of anesthesia    " pt stated they  gave me too much - kept having to be reminded to breathe   . Constipation    opiod induced associated with back pain  . Depression    hx of   . Diverticulosis   . DIVERTICULOSIS, COLON 01/13/2009  . Headache    history of migraines  . Heart murmur    benign, dagnosed around age 60,prophylactic antibiotic  . Low back pain   . Lumbar spondylolysis   . Osteopenia   . Pneumonia    hx of as a baby   . PVC's (premature ventricular contractions)   . Rectocele   . Tubular adenoma of colon     Family History  Problem Relation Age of Onset  . Alzheimer's disease Father   . Heart disease Father        mitral valve replaced. unknown reason  . Breast cancer Mother        24  . Colon polyps Mother   . Diabetes Mother   . Heart disease Mother 65       CHF, MI 31  . Heart attack Mother        x 2  . Prostate cancer Brother   . Colon cancer Neg Hx     Past Surgical History:  Procedure Laterality Date  . ABDOMINAL HERNIA REPAIR Right    incisional  . ABDOMINAL HYSTERECTOMY     with ovaries  . BACK SURGERY  2009   L4-L5 with rods  . BLADDER SUSPENSION N/A 04/09/2017   Procedure: TRANSVAGINAL TAPE (TVT) PROCEDURE;  Surgeon: Bobbye Charleston, MD;  Location: [facility];  Service: Gynecology;  Laterality: N/A;  . CATARACT EXTRACTION, BILATERAL    . cataract left lens replacement     2016  . COLONOSCOPY    . COLONOSCOPY N/A 08/29/2017   Procedure: COLONOSCOPY;  Surgeon: Jerene Bears, MD;  Location: [facility];  Service: Gastroenterology;  Laterality: N/A;  . CYSTOCELE REPAIR N/A 04/09/2017   Procedure: ANTERIOR REPAIR (CYSTOCELE);  Surgeon: Bobbye Charleston, MD;  Location: [facility];  Service: Gynecology;  Laterality: N/A;  . CYSTOSCOPY N/A 04/09/2017   Procedure: CYSTOSCOPY;  Surgeon: Bobbye Charleston, MD;  Location: [facility];  Service: Gynecology;  Laterality: N/A;  . EYE MUSCLE SURGERY    . GLAUCOMA SURGERY Left 05/12/14  . incisional ab  05/28/11  . JOINT REPLACEMENT     right hip  replacement   . LUMBAR FUSION     L4-L5  . PARTIAL KNEE ARTHROPLASTY Right   . PARTIAL KNEE ARTHROPLASTY Left   . perforated deverticular abscess/laporotomy and colostomy     depression after this  . POLYPECTOMY N/A 08/29/2017   Procedure: POLYPECTOMY;  Surgeon: Jerene Bears, MD;  Location: [facility];  Service: Gastroenterology;  Laterality: N/A;  . stribismus eye surgery Right   . take down of colostomy    . tonsillectomly    . TONSILLECTOMY    . TOTAL HIP ARTHROPLASTY  Right   . TOTAL KNEE REVISION Left 11/24/2015   Procedure: Conversion from left lateral unicompartmental knee arthroplasty to left total knee arthroplasty;  Surgeon: Mcarthur Rossetti, MD;  Location: [facility];  Service: Orthopedics;  Laterality: Left;   Social History   Occupational History  . Occupation: cpa/retired    Fish farm manager: Retired  Tobacco Use  . Smoking status: Never Smoker  . Smokeless tobacco: Never Used  Substance and Sexual Activity  . Alcohol use: Yes    Alcohol/week: 4.2 oz    Types: 7 Glasses of wine per week    Comment: 1 glass of wine daily   . Drug use: No  . Sexual activity: Yes    Birth control/protection: Post-menopausal

## 2017-11-12 ENCOUNTER — Telehealth: Payer: Self-pay

## 2017-11-12 NOTE — Telephone Encounter (Signed)
NOT NEEDED

## 2017-11-13 DIAGNOSIS — W19XXXA Unspecified fall, initial encounter: Secondary | ICD-10-CM | POA: Diagnosis not present

## 2017-11-13 DIAGNOSIS — G8918 Other acute postprocedural pain: Secondary | ICD-10-CM | POA: Diagnosis not present

## 2017-11-13 DIAGNOSIS — Y998 Other external cause status: Secondary | ICD-10-CM | POA: Diagnosis not present

## 2017-11-13 DIAGNOSIS — S52572A Other intraarticular fracture of lower end of left radius, initial encounter for closed fracture: Secondary | ICD-10-CM | POA: Diagnosis not present

## 2017-11-13 HISTORY — PX: FRACTURE SURGERY: SHX138

## 2017-11-18 DIAGNOSIS — H401132 Primary open-angle glaucoma, bilateral, moderate stage: Secondary | ICD-10-CM | POA: Diagnosis not present

## 2017-11-18 DIAGNOSIS — H35413 Lattice degeneration of retina, bilateral: Secondary | ICD-10-CM | POA: Diagnosis not present

## 2017-11-18 DIAGNOSIS — H35371 Puckering of macula, right eye: Secondary | ICD-10-CM | POA: Diagnosis not present

## 2017-11-18 DIAGNOSIS — H43813 Vitreous degeneration, bilateral: Secondary | ICD-10-CM | POA: Diagnosis not present

## 2017-11-18 DIAGNOSIS — H4423 Degenerative myopia, bilateral: Secondary | ICD-10-CM | POA: Diagnosis not present

## 2017-11-18 DIAGNOSIS — H353131 Nonexudative age-related macular degeneration, bilateral, early dry stage: Secondary | ICD-10-CM | POA: Diagnosis not present

## 2017-11-19 ENCOUNTER — Other Ambulatory Visit (INDEPENDENT_AMBULATORY_CARE_PROVIDER_SITE_OTHER): Payer: Self-pay | Admitting: Orthopaedic Surgery

## 2017-11-19 MED ORDER — HYDROCODONE-ACETAMINOPHEN 5-325 MG PO TABS: 1.0000 | ORAL_TABLET | Freq: Four times a day (QID) | ORAL | 0 refills | Status: DC | PRN

## 2017-11-19 NOTE — Telephone Encounter (Signed)
She can change her dressing at any time and get her incision wet in the shower.  She just needs to put band-aides daily on her incision after she showers. Can come and pick up script.

## 2017-11-19 NOTE — Telephone Encounter (Signed)
Patient aware of the below message  

## 2017-11-19 NOTE — Telephone Encounter (Signed)
Med refill  Hydocodone pt has two pills left   Tanya Leon has questions about her dressing. Pt has appt at 2 today, will return home by 3:30pm.

## 2017-11-19 NOTE — Telephone Encounter (Signed)
Please advise  ORIF left wrist 11/03/17

## 2017-11-20 ENCOUNTER — Other Ambulatory Visit: Payer: Self-pay | Admitting: Family Medicine

## 2017-11-21 ENCOUNTER — Other Ambulatory Visit: Payer: Self-pay

## 2017-11-21 MED ORDER — PREGABALIN 25 MG PO CAPS: ORAL_CAPSULE | ORAL | 1 refills | Status: DC

## 2017-11-27 ENCOUNTER — Ambulatory Visit (INDEPENDENT_AMBULATORY_CARE_PROVIDER_SITE_OTHER): Payer: Medicare Other

## 2017-11-27 ENCOUNTER — Ambulatory Visit (INDEPENDENT_AMBULATORY_CARE_PROVIDER_SITE_OTHER): Payer: Medicare Other | Admitting: Physician Assistant

## 2017-11-27 ENCOUNTER — Encounter (INDEPENDENT_AMBULATORY_CARE_PROVIDER_SITE_OTHER): Payer: Self-pay | Admitting: Physician Assistant

## 2017-11-27 DIAGNOSIS — H401122 Primary open-angle glaucoma, left eye, moderate stage: Secondary | ICD-10-CM | POA: Diagnosis not present

## 2017-11-27 DIAGNOSIS — M25532 Pain in left wrist: Secondary | ICD-10-CM | POA: Diagnosis not present

## 2017-11-27 DIAGNOSIS — S52572A Other intraarticular fracture of lower end of left radius, initial encounter for closed fracture: Secondary | ICD-10-CM

## 2017-11-27 NOTE — Progress Notes (Signed)
HPI: Tanya Leon returns today just 2 weeks status post open reduction internal fixation of a left distal radius fracture.  Still having some discomfort in her wrist.  Is taking hydrocodone as needed occasional ibuprofen.  She notes she had she does state that she developed a knot near vomiting some redness due to the brace.  She is wearing a removable Velcro wrist splint.    Physical exam: She has a slight abrasion left no signs of infection.  Surgical incisions well approximated with interrupted nylon sutures.  There is no signs of infection.  She does have swelling of the hand.  She is able to fully supinate pronate the forearm.  Weakness of the thumb as one would expect is present.  Radiographs:Left wrist : Status post open reduction internal fixation left distal radius fracture with good overall alignment position.  No hardware failure.   Impression: 2 weeks status post open reduction internal fixation left distal radius fracture  Plan: Sutures removed today Steri-Strips applied.  She is able to shower and get the incision wet.  She should work on range of motion of the wrist supination pronation of the hand and I advised her to get a squeeze ball to help with the edema she is having in her hand.  No heavy lifting with the arm.  Will obtain AP lateral and oblique views of the left wrist at return.

## 2017-12-22 ENCOUNTER — Other Ambulatory Visit: Payer: Self-pay

## 2017-12-22 MED ORDER — TIZANIDINE HCL 2 MG PO TABS: 2.0000 mg | ORAL_TABLET | Freq: Every day | ORAL | 1 refills | Status: DC

## 2017-12-22 MED ORDER — DICLOFENAC SODIUM 75 MG PO TBEC: 75.0000 mg | DELAYED_RELEASE_TABLET | Freq: Two times a day (BID) | ORAL | 0 refills | Status: DC | PRN

## 2017-12-22 NOTE — Telephone Encounter (Signed)
Received a medication refill from pharmacy for the following medication:    diclofenac (VOLTAREN) 75 MG EC tablet,  Please advise.

## 2017-12-25 ENCOUNTER — Encounter (INDEPENDENT_AMBULATORY_CARE_PROVIDER_SITE_OTHER): Payer: Self-pay | Admitting: Physician Assistant

## 2017-12-25 ENCOUNTER — Ambulatory Visit (INDEPENDENT_AMBULATORY_CARE_PROVIDER_SITE_OTHER): Payer: Medicare Other | Admitting: Physician Assistant

## 2017-12-25 ENCOUNTER — Ambulatory Visit (INDEPENDENT_AMBULATORY_CARE_PROVIDER_SITE_OTHER): Payer: Medicare Other

## 2017-12-25 DIAGNOSIS — M25532 Pain in left wrist: Secondary | ICD-10-CM

## 2017-12-25 NOTE — Progress Notes (Signed)
Office Visit Note   Patient: Tanya Leon           Date of Birth: Nov 20, 1940           MRN: [id number] Visit Date: 12/25/2017              Requested by: Marin Olp, MD [place], [place] [address] PCP: Marin Olp, MD   Assessment & Plan: Visit Diagnoses:  1. Pain in left wrist     Plan: She will continue work on range of motion of the left wrist.  She can discontinue the left wrist splint.  Still no heavy lifting left arm for the next month.  We will see her back in a month to check and see how overall her left wrist is doing in regards to range of motion.  No radiographs at that time.  Follow-Up Instructions: Return in about 1 month (around 01/22/2018).   Orders:  Orders Placed This Encounter  Procedures  . XR Wrist 2 Views Left   No orders of the defined types were placed in this encounter.     Procedures: No procedures performed   Clinical Data: No additional findings.   Subjective: Chief Complaint  Patient presents with  . Left Wrist - Pain    HPI Tanya Leon returns today for follow-up of her left wrist status post open reduction internal fixation now 6 weeks postop.  She overall is trending towards improvement.  She still having some tingling in her hand and some swelling.  She is been coming out of the brace and working on range of motion of the wrist and hand and also working with a squeeze ball to help with the swelling.  Review of Systems  Objective: Vital Signs: There were no vitals taken for this visit.  Physical Exam  Constitutional: She appears well-developed and well-nourished. No distress.  Skin: She is not diaphoretic.    Ortho Exam Left wrist she has slightly limited dorsal and volar flexion.  She is able to fully extend her thumb she has good sensation throughout the fingertips.  Minimal edema about the wrist and hand.  No signs of wound healing problems or dehiscence/infection.  Specialty Comments:  No  specialty comments available.  Imaging: Xr Wrist 2 Views Left  Result Date: 12/25/2017 3 views left wrist: No acute fractures.  No hardware failure.  Fracture appears to be well-healed.    PMFS History: Patient Active Problem List   Diagnosis Date Noted  . 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 Benign neoplasm of cecum   . History of colonic polyps   . Polyp of colon   . Pulmonary hypertension, unspecified ([place]) 07/01/2017  . Cervical arthritis 06/30/2017  . PVC's (premature ventricular contractions) 03/26/2017  . 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 Status post total left knee replacement 11/24/2015  . Thrombocytopenia ([place]) 05/03/2015  . Basal cell carcinoma 07/07/2014  . 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 Glaucoma 06/01/2014  . Depression 06/01/2014  . 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 COLONIC POLYPS 01/13/2009  . 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 SPINAL STENOSIS, LUMBAR 01/25/2008  . INSOMNIA, CHRONIC 07/22/2007  . ALLERGIC RHINITIS 05/21/2007  . Osteopenia 02/26/2007   Past Medical History:  Diagnosis Date  . Arthritis   . Cancer (HCC)    hx of skin cancer   . Colon polyps    adenomatous  . Complication of anesthesia    "  pt stated they gave me too much - kept having to be reminded to breathe   . Constipation    opiod induced associated with back pain  . Depression    hx of   . Diverticulosis   . DIVERTICULOSIS, COLON 01/13/2009  . Headache    history of migraines  . Heart murmur    benign, dagnosed around age 22,prophylactic antibiotic  . Low back pain   . Lumbar spondylolysis   . Osteopenia   . Pneumonia    hx of as a baby   . PVC's (premature ventricular contractions)   . Rectocele   . Tubular adenoma of colon     Family History  Problem Relation Age of Onset  . Alzheimer's disease Father   . Heart disease Father        mitral valve replaced. unknown reason  . Breast cancer  Mother        63  . Colon polyps Mother   . Diabetes Mother   . Heart disease Mother 42       CHF, MI 9  . Heart attack Mother        x 2  . Prostate cancer Brother   . Colon cancer Neg Hx     Past Surgical History:  Procedure Laterality Date  . ABDOMINAL HERNIA REPAIR Right    incisional  . ABDOMINAL HYSTERECTOMY     with ovaries  . BACK SURGERY  2009   L4-L5 with rods  . BLADDER SUSPENSION N/A 04/09/2017   Procedure: TRANSVAGINAL TAPE (TVT) PROCEDURE;  Surgeon: Bobbye Charleston, MD;  Location: [facility];  Service: Gynecology;  Laterality: N/A;  . CATARACT EXTRACTION, BILATERAL    . cataract left lens replacement     2016  . COLONOSCOPY    . COLONOSCOPY N/A 08/29/2017   Procedure: COLONOSCOPY;  Surgeon: Jerene Bears, MD;  Location: [facility];  Service: Gastroenterology;  Laterality: N/A;  . CYSTOCELE REPAIR N/A 04/09/2017   Procedure: ANTERIOR REPAIR (CYSTOCELE);  Surgeon: Bobbye Charleston, MD;  Location: [facility];  Service: Gynecology;  Laterality: N/A;  . CYSTOSCOPY N/A 04/09/2017   Procedure: CYSTOSCOPY;  Surgeon: Bobbye Charleston, MD;  Location: [facility];  Service: Gynecology;  Laterality: N/A;  . EYE MUSCLE SURGERY    . GLAUCOMA SURGERY Left 05/12/14  . incisional ab  05/28/11  . JOINT REPLACEMENT     right hip replacement   . LUMBAR FUSION     L4-L5  . PARTIAL KNEE ARTHROPLASTY Right   . PARTIAL KNEE ARTHROPLASTY Left   . perforated deverticular abscess/laporotomy and colostomy     depression after this  . POLYPECTOMY N/A 08/29/2017   Procedure: POLYPECTOMY;  Surgeon: Jerene Bears, MD;  Location: [facility];  Service: Gastroenterology;  Laterality: N/A;  . stribismus eye surgery Right   . take down of colostomy    . tonsillectomly    . TONSILLECTOMY    . TOTAL HIP ARTHROPLASTY Right   . TOTAL KNEE REVISION Left 11/24/2015   Procedure: Conversion from left lateral unicompartmental knee arthroplasty to left total knee arthroplasty;  Surgeon: Mcarthur Rossetti, MD;  Location: [facility];  Service: Orthopedics;  Laterality: Left;   Social History   Occupational History  . Occupation: cpa/retired    Fish farm manager: Retired  Tobacco Use  . Smoking status: Never Smoker  . Smokeless tobacco: Never Used  Substance and Sexual Activity  . Alcohol use: Yes    Alcohol/week: 4.2 oz    Types: 7 Glasses  of wine per week    Comment: 1 glass of wine daily   . Drug use: No  . Sexual activity: Yes    Birth control/protection: Post-menopausal

## 2017-12-29 ENCOUNTER — Ambulatory Visit: Payer: Medicare Other | Admitting: Family Medicine

## 2017-12-29 DIAGNOSIS — H0014 Chalazion left upper eyelid: Secondary | ICD-10-CM | POA: Diagnosis not present

## 2017-12-29 DIAGNOSIS — H401112 Primary open-angle glaucoma, right eye, moderate stage: Secondary | ICD-10-CM | POA: Diagnosis not present

## 2017-12-29 DIAGNOSIS — H401132 Primary open-angle glaucoma, bilateral, moderate stage: Secondary | ICD-10-CM | POA: Diagnosis not present

## 2017-12-29 DIAGNOSIS — H401122 Primary open-angle glaucoma, left eye, moderate stage: Secondary | ICD-10-CM | POA: Diagnosis not present

## 2017-12-29 DIAGNOSIS — H40052 Ocular hypertension, left eye: Secondary | ICD-10-CM | POA: Diagnosis not present

## 2017-12-30 ENCOUNTER — Ambulatory Visit: Payer: Medicare Other | Admitting: Family Medicine

## 2018-01-22 ENCOUNTER — Encounter (INDEPENDENT_AMBULATORY_CARE_PROVIDER_SITE_OTHER): Payer: Self-pay | Admitting: Physician Assistant

## 2018-01-22 ENCOUNTER — Ambulatory Visit (INDEPENDENT_AMBULATORY_CARE_PROVIDER_SITE_OTHER): Payer: Medicare Other | Admitting: Physician Assistant

## 2018-01-22 VITALS — Ht 62.0 in | Wt 141.0 lb

## 2018-01-22 DIAGNOSIS — S52572A Other intraarticular fracture of lower end of left radius, initial encounter for closed fracture: Secondary | ICD-10-CM

## 2018-01-22 NOTE — Progress Notes (Signed)
HPI: Mrs. Tanya Leon returns today.  Returns today 10 weeks status post open reduction internal fixation distal left radius fracture.  She is working on range of motion on her own.  She states she is not back to normal yet but getting there.  She has no real pain.  Still has some numbness tingling in 2 days ago but it went away quickly.  Left wrist: She has full volar flexion she lacks the last 5 to 7 degrees and dorsiflexion.  She has full supination pronation.  Full sensation throughout the hand.  Full motor left hand.  Radial pulses intact.  Surgical incisions healing well no signs of infection.    Impression: Status post open reduction internal fixation left distal radius fracture 11/13/2017  Plan: She will work on range of motion on her own.  I did again thought to squeeze ball hours putty to work on grip strengthening.  She will try this on her she does not feel like she is getting range of motion and strength back in the hand she will call the office and therapy.  Otherwise we will see her back in 3 months check progress lack of.  No x-rays at that time.

## 2018-01-27 ENCOUNTER — Ambulatory Visit: Payer: Medicare Other | Admitting: Family Medicine

## 2018-01-28 DIAGNOSIS — H401122 Primary open-angle glaucoma, left eye, moderate stage: Secondary | ICD-10-CM | POA: Diagnosis not present

## 2018-01-28 DIAGNOSIS — H0014 Chalazion left upper eyelid: Secondary | ICD-10-CM | POA: Diagnosis not present

## 2018-01-28 DIAGNOSIS — L821 Other seborrheic keratosis: Secondary | ICD-10-CM | POA: Diagnosis not present

## 2018-01-28 DIAGNOSIS — L723 Sebaceous cyst: Secondary | ICD-10-CM | POA: Diagnosis not present

## 2018-01-28 DIAGNOSIS — L309 Dermatitis, unspecified: Secondary | ICD-10-CM | POA: Diagnosis not present

## 2018-01-28 DIAGNOSIS — Z85828 Personal history of other malignant neoplasm of skin: Secondary | ICD-10-CM | POA: Diagnosis not present

## 2018-01-28 DIAGNOSIS — H401112 Primary open-angle glaucoma, right eye, moderate stage: Secondary | ICD-10-CM | POA: Diagnosis not present

## 2018-01-28 DIAGNOSIS — H1013 Acute atopic conjunctivitis, bilateral: Secondary | ICD-10-CM | POA: Diagnosis not present

## 2018-02-02 ENCOUNTER — Ambulatory Visit (INDEPENDENT_AMBULATORY_CARE_PROVIDER_SITE_OTHER): Payer: Medicare Other | Admitting: Family Medicine

## 2018-02-02 ENCOUNTER — Encounter: Payer: Self-pay | Admitting: Family Medicine

## 2018-02-02 VITALS — BP 132/76 | HR 57 | Temp 97.4°F | Ht 62.0 in | Wt 138.0 lb

## 2018-02-02 DIAGNOSIS — M48061 Spinal stenosis, lumbar region without neurogenic claudication: Secondary | ICD-10-CM | POA: Diagnosis not present

## 2018-02-02 DIAGNOSIS — M47812 Spondylosis without myelopathy or radiculopathy, cervical region: Secondary | ICD-10-CM

## 2018-02-02 DIAGNOSIS — G4700 Insomnia, unspecified: Secondary | ICD-10-CM | POA: Diagnosis not present

## 2018-02-02 DIAGNOSIS — R293 Abnormal posture: Secondary | ICD-10-CM | POA: Diagnosis not present

## 2018-02-02 NOTE — Progress Notes (Signed)
Subjective:  Tanya Leon is a 77 y.o. year old very pleasant female patient who presents for/with See problem oriented charting ROS-no reported chest pain or shortness of breath.  Continued issues with poor posture.  No edema  Past Medical History-  Patient Active Problem List   Diagnosis Date Noted  . Poor posture 02/02/2018    Priority: Medium  . Pulmonary hypertension, unspecified ([place]) 07/01/2017    Priority: Medium  . Cervical arthritis 06/30/2017    Priority: Medium  . PVC's (premature ventricular contractions) 03/26/2017    Priority: Medium  . Basal cell carcinoma 07/07/2014    Priority: Medium  . Depression 06/01/2014    Priority: Medium  . SPINAL STENOSIS, LUMBAR 01/25/2008    Priority: Medium  . INSOMNIA, CHRONIC 07/22/2007    Priority: Medium  . 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 Thrombocytopenia ([place]) 05/03/2015    Priority: Low  . 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 Glaucoma 06/01/2014    Priority: Low  . 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 COLONIC POLYPS 01/13/2009    Priority: Low  . 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 ALLERGIC RHINITIS 05/21/2007    Priority: Low  . Osteopenia 02/26/2007    Priority: Low    Medications- reviewed and updated Current Outpatient Medications  Medication Sig Dispense Refill  . AMBULATORY NON FORMULARY MEDICATION Evening Primerose Oil 1300 mg take 1 by mouth twice a day    . amoxicillin (AMOXIL) 500 MG capsule TAKE 4 CAPSULES 1 HOUR PRIOR TO APPOINTMENT  0  . aspirin-acetaminophen-caffeine (EXCEDRIN EXTRA STRENGTH) 250-250-65 MG per tablet Take 2 tablets by mouth every 6 (six) hours as needed for headache. Reported on 11/03/2015    . Azelaic Acid (FINACEA) 15 % cream Apply 1 application topically 2 (two) times daily. After skin is thoroughly washed and patted dry, gently but thoroughly massage a thin  film of azelaic acid cream into the affected area twice daily, in the morning and evening.    Marland Kitchen azithromycin (AZASITE) 1 % ophthalmic solution Place 1 drop into both eyes daily. Two weeks each month    . carboxymethylcellulose (REFRESH PLUS) 0.5 % SOLN Place 1 drop into both eyes daily as needed (Dry eyes.).     Marland Kitchen Cholecalciferol (VITAMIN D) 1000 UNITS capsule Take 2,000 Units by mouth every evening.     . Ciclopirox 1 % shampoo Apply 1 each topically every Friday.     . cycloSPORINE (RESTASIS) 0.05 % ophthalmic emulsion Place 1 drop into both eyes 2 (two) times daily.     . dorzolamide (TRUSOPT) 2 % ophthalmic solution Place 1 drop into the left eye 2 (two) times daily.  5  . DULoxetine (CYMBALTA) 60 MG capsule Take 1 capsule (60 mg total) by mouth daily. 90 capsule 3  . estradiol (VIVELLE-DOT) 0.05 MG/24HR Place 1 patch onto the skin 2 (two) times a week. Reported on 11/03/2015 on thursdays and Sundays    . fluticasone (FLONASE) 50 MCG/ACT nasal spray SHAKE LIQUID AND USE 2 SPRAYS IN EACH NOSTRIL DAILY 48 g 3  . Lactobacillus (MORE-DOPHILUS ACIDOPHILUS PO) Take 1 capsule by mouth daily.    . Lutein 20 MG TABS Take 1 tablet by mouth 2 (two) times daily.     . Multiple Vitamins-Minerals (ICAPS AREDS 2) CAPS Take 1 capsule by mouth 2 (two) times daily.     Marland Kitchen  Multiple Vitamins-Minerals (MULTIVITAMIN PO) Take 1 tablet by mouth every morning.     . neomycin-polymyxin b-dexamethasone (MAXITROL) 3.5-10000-0.1 OINT Place 1 application into both eyes as needed. No more than twice a week  0  . NONFORMULARY OR COMPOUNDED ITEM Apply 1-2 g topically daily. [employer] Nail lacquer: Fluconazole 2%, Terbinafine 1%, DMSO 120 each 11  . olopatadine (PATANOL) 0.1 % ophthalmic solution Place 1 drop into both eyes 2 (two) times daily.    Marland Kitchen omega-3 acid ethyl esters (LOVAZA) 1 G capsule Take 1 g by mouth 2 (two) times daily.     . polyethylene glycol powder (GLYCOLAX/MIRALAX) powder Take 17 g by mouth every morning.     .  pregabalin (LYRICA) 25 MG capsule TAKE 1 CAPSULE IN THE MORNING AND TAKE 2 TABLETS IN THE EVENING 270 capsule 1  . SHINGRIX injection TO BE ADMINISTERED BY PHARMACIST  1  . tiZANidine (ZANAFLEX) 2 MG tablet Take 1 tablet (2 mg total) by mouth at bedtime. 90 tablet 1  . zolpidem (AMBIEN) 5 MG tablet TAKE 1 TABLET AT BEDTIME AS NEEDED FOR SLEEP 30 tablet 5   No current facility-administered medications for this visit.     Objective: BP 132/76 (BP Location: Left Arm, Patient Position: Sitting, Cuff Size: Normal)   Pulse (!) 57   Temp (!) 97.4 F (36.3 C) (Oral)   Ht 5\' 2"  (1.575 m)   Wt 138 lb (62.6 kg)   SpO2 95%   BMI 25.24 kg/m  Gen: NAD, resting comfortably CV: RRR no murmurs rubs or gallops. Not bradycardic on my exam Lungs: CTAB no crackles, wheeze, rhonchi Abdomen: soft/nontender/nondistended/normal bowel sounds. Healthy weight Ext: no edema Skin: warm, dry Neuro: normal speech  Assessment/Plan:  Other notes: 1.Had seen cardiology for palpitations and was found to have PVCs.  She had an echocardiogram which showed moderate mitral regurgitation and tricuspid regurgitation and mild hypertension.  She follows up with Dr. Gwenlyn Found.  Cutting down on caffeine helped her palpitations  SPINAL STENOSIS, LUMBAR S: Back in December we started her on diclofenac for her neck pain if pain gets up to 6 out of 10.  She got scared of this due to fact she had dark stools and so she has tended to avoid this. When she stopped this medicine it resolved.   We continued her Lyrica at 25 mg in the morning and 50 mg in the evening for pain from lumbar stenosis.  She is seen physical therapy in the past for cervical arthritis.  Heat massage continue to help.  Tizanidine helps at night.  More extensive note on history from 06/30/2017. A/P: She has not noted worsening pain going to 25mg  in AM and 50mg  in PM on lyrica over last 2 years- wants to trial lower at 25mg  AM and PM- we will not change meds on med  list yet but will at follow up if she does not have worsening pain with decreasing dose -I think it is fine for her to avoid the diclofenac due to the dark stools.  No signs of melena or potential melena since stopping medication.  We will remove diclofenac from med list  INSOMNIA, CHRONIC S: Continues to sleep well on Ambien nightly.  No driving issues.  No abnormal dreams.  She remains on tizanidine as well for neck stiffness at night which helps A/P: Continue current medications  Poor posture referred to Dr. Paulla Fore in January due to slight droop of right shoulder when walking as well as in general issues  with posture- she had complained of poor posture with walking in particular.  Dr. Paulla Fore referred her to neurosurgery due to concern of loose hardware. Dr. Trenton Gammon did MRI and told her that hardware is still in proper place and not contributing to pain. She does have pain related to vertebrae above this at L3-L4 She later saw neurosurgery and was referred to integrative therapies- she is working through that.  She was not thrilled by Dr. Irven Baltimore opinion that this was age-related   Future Appointments  Date Time Provider [place]  04/23/2018  2:15 PM Pete Pelt, PA-C [facility] None  07/01/2018  8:00 AM Williemae Area, RN [facility]  07/01/2018  9:30 AM Marin Olp, MD [facility]  07/08/2018 10:30 AM [id number]  08/05/2018  9:45 AM Marin Olp, MD [facility]   Return in about 6 months (around 08/05/2018) for follow up- or sooner if needed. .  Return precautions advised.  Garret Reddish, MD

## 2018-02-02 NOTE — Assessment & Plan Note (Signed)
S: Back in December we started her on diclofenac for her neck pain if pain gets up to 6 out of 10.  She got scared of this due to fact she had dark stools and so she has tended to avoid this. When she stopped this medicine it resolved.   We continued her Lyrica at 25 mg in the morning and 50 mg in the evening for pain from lumbar stenosis.  She is seen physical therapy in the past for cervical arthritis.  Heat massage continue to help.  Tizanidine helps at night.  More extensive note on history from 06/30/2017. A/P: She has not noted worsening pain going to 25mg  in AM and 50mg  in PM on lyrica over last 2 years- wants to trial lower at 25mg  AM and PM- we will not change meds on med list yet but will at follow up if she does not have worsening pain with decreasing dose -I think it is fine for her to avoid the diclofenac due to the dark stools.  No signs of melena or potential melena since stopping medication.  We will remove diclofenac from med list

## 2018-02-02 NOTE — Assessment & Plan Note (Signed)
referred to Dr. Paulla Fore in January due to slight droop of right shoulder when walking as well as in general issues with posture- she had complained of poor posture with walking in particular.  Dr. Paulla Fore referred her to neurosurgery due to concern of loose hardware. Dr. Trenton Gammon did MRI and told her that hardware is still in proper place and not contributing to pain. She does have pain related to vertebrae above this at L3-L4 She later saw neurosurgery and was referred to integrative therapies- she is working through that.  She was not thrilled by Dr. Irven Baltimore opinion that this was age-related

## 2018-02-02 NOTE — Assessment & Plan Note (Signed)
S: Continues to sleep well on Ambien nightly.  No driving issues.  No abnormal dreams.  She remains on tizanidine as well for neck stiffness at night which helps A/P: Continue current medications

## 2018-02-02 NOTE — Patient Instructions (Addendum)
Our team will set you up with a PHQ 9 test.    She has not noted worsening pain going to 25mg  in AM and 50mg  in PM on lyrica over last 2 years- wants to trial lower at 25mg  AM and PM- we will not change meds on med list yet but will at follow up if she does not have worsening pain with decreasing dose

## 2018-02-18 DIAGNOSIS — Z1231 Encounter for screening mammogram for malignant neoplasm of breast: Secondary | ICD-10-CM | POA: Diagnosis not present

## 2018-02-18 DIAGNOSIS — Z803 Family history of malignant neoplasm of breast: Secondary | ICD-10-CM | POA: Diagnosis not present

## 2018-02-18 DIAGNOSIS — M8589 Other specified disorders of bone density and structure, multiple sites: Secondary | ICD-10-CM | POA: Diagnosis not present

## 2018-02-18 DIAGNOSIS — M810 Age-related osteoporosis without current pathological fracture: Secondary | ICD-10-CM | POA: Diagnosis not present

## 2018-02-26 ENCOUNTER — Other Ambulatory Visit: Payer: Self-pay | Admitting: Family Medicine

## 2018-02-26 NOTE — Telephone Encounter (Signed)
Copied from [place] [id number]. Topic: Quick Communication - Rx Refill/Question >> Feb 26, 2018 11:01 AM Aurelio Brash B wrote: Medication: zolpidem (AMBIEN) 5 MG tablet  Has the patient contacted their pharmacy? {yes (Agent: If no, request that the patient contact the pharmacy for the refill.) (Agent: If yes, when and what did the pharmacy advise?)  Preferred Pharmacy (with phone number or street name):[id number] - [place], [place]. [address] [place] [phone] (Phone) [phone] (Fax      Agent: Please be advised that RX refills may take up to 3 business days. We ask that you follow-up with your pharmacy.

## 2018-02-26 NOTE — Telephone Encounter (Signed)
Ambien refill Last Refill:06/24/17 # 30 with 5 refills Last OV: 02/02/18 PCP: Dr. Yong Channel [facility]   [place]

## 2018-02-27 MED ORDER — ZOLPIDEM TARTRATE 5 MG PO TABS: 5.0000 mg | ORAL_TABLET | Freq: Every evening | ORAL | 5 refills | Status: DC | PRN

## 2018-04-01 DIAGNOSIS — H401122 Primary open-angle glaucoma, left eye, moderate stage: Secondary | ICD-10-CM | POA: Diagnosis not present

## 2018-04-01 DIAGNOSIS — H401112 Primary open-angle glaucoma, right eye, moderate stage: Secondary | ICD-10-CM | POA: Diagnosis not present

## 2018-04-01 DIAGNOSIS — H53452 Other localized visual field defect, left eye: Secondary | ICD-10-CM | POA: Diagnosis not present

## 2018-04-01 DIAGNOSIS — H01009 Unspecified blepharitis unspecified eye, unspecified eyelid: Secondary | ICD-10-CM | POA: Diagnosis not present

## 2018-04-06 ENCOUNTER — Encounter (INDEPENDENT_AMBULATORY_CARE_PROVIDER_SITE_OTHER): Payer: Self-pay | Admitting: Orthopaedic Surgery

## 2018-04-06 ENCOUNTER — Ambulatory Visit (INDEPENDENT_AMBULATORY_CARE_PROVIDER_SITE_OTHER): Payer: Medicare Other | Admitting: Orthopaedic Surgery

## 2018-04-06 DIAGNOSIS — M7022 Olecranon bursitis, left elbow: Secondary | ICD-10-CM | POA: Diagnosis not present

## 2018-04-06 MED ORDER — METHYLPREDNISOLONE 4 MG PO TABS: ORAL_TABLET | ORAL | 0 refills | Status: DC

## 2018-04-06 NOTE — Progress Notes (Signed)
Office Visit Note   Patient: Tanya Leon           Date of Birth: 07-25-1941           MRN: [id number] Visit Date: 04/06/2018              Requested by: Marin Olp, MD [place], [place] [address] PCP: Marin Olp, MD   Assessment & Plan: Visit Diagnoses:  1. Olecranon bursitis of left elbow     Plan: I would like to try 6-day steroid taper to work with inflammation of her left knee and the carpal tunnel symptoms in her left hand.  I want her to stop all exercise activities with that left hand and start massaging the scar as well.  I will see her back in 2 weeks to see how she is doing overall.  She still having numbness tingling her hand we would need nerve conduction studies to evaluate compression of the transverse carpal ligament based on having previous surgery in this area from a wrist fracture.  I would also like to have 2 views of her left knee at that visit if she continues to have knee problems.  Follow-Up Instructions: Return in about 2 weeks (around 04/20/2018).   Orders:  No orders of the defined types were placed in this encounter.  Meds ordered this encounter  Medications  . methylPREDNISolone (MEDROL) 4 MG tablet    Sig: Medrol dose pack. Take as instructed    Dispense:  21 tablet    Refill:  0      Procedures: No procedures performed   Clinical Data: No additional findings.   Subjective: Chief Complaint  Patient presents with  . Left Elbow - Edema  Patient is well-known to me.  She comes in with acute left elbow swelling over the olecranon area.  She is not sure what the cause of this is.  She is never had this before.  She does have a history of a left total knee arthroplasty that was a conversion from a partial knee to a total knee.  Is been having pain along the lateral aspect of her knee on that side.  She also has a history of open reduction-internal fixation of a left distal radius fracture.  This was 5 months ago.  She  is still having some numbness and tingling in her middle finger.  She is been working on scar massaging daily and working on a squeeze ball daily as well.  She is mainly here to for me to see her for the left elbow swelling.  She is not a diabetic.  HPI  Review of Systems She currently denies any headache, chest pain, shortness of breath, fever, chills, nausea, vomiting.  Objective: Vital Signs: There were no vitals taken for this visit.  Physical Exam She is alert and oriented x3 in no acute distress examination of her left elbow does show swelling and fluid collection consistent with olecranon bursitis.  I was able to aspirate fluid from this area that was consistent more of a hematoma.  It decompressed fully.  I placed a compressive dressing over this.  I did examine her left knee and it feels like his ligaments is stable.  Her pain is along the iliotibial band and the peroneal tendon area.  She also has subjective numbness in her left hand at the median nerve distribution. Ortho Exam  Specialty Comments:  No specialty comments available.  Imaging: No results found.  PMFS History: Patient Active Problem List   Diagnosis Date Noted  . Poor posture 02/02/2018  . Pulmonary hypertension, unspecified ([place]) 07/01/2017  . Cervical arthritis 06/30/2017  . PVC's (premature ventricular contractions) 03/26/2017  . 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 Thrombocytopenia ([place]) 05/03/2015  . Basal cell carcinoma 07/07/2014  . 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 Glaucoma 06/01/2014  . Depression 06/01/2014  . 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 COLONIC POLYPS 01/13/2009  . 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 SPINAL STENOSIS, LUMBAR 01/25/2008  . INSOMNIA, CHRONIC 07/22/2007  . ALLERGIC RHINITIS 05/21/2007  . Osteopenia 02/26/2007   Past Medical History:  Diagnosis Date  . Arthritis   . Cancer (HCC)    hx of skin cancer   . Colon  polyps    adenomatous  . Complication of anesthesia    " pt stated they gave me too much - kept having to be reminded to breathe   . Constipation    opiod induced associated with back pain  . Depression    hx of   . Diverticulosis   . DIVERTICULOSIS, COLON 01/13/2009  . Headache    history of migraines  . Heart murmur    benign, dagnosed around age 77,prophylactic antibiotic  . Low back pain   . Lumbar spondylolysis   . Osteopenia   . Pneumonia    hx of as a baby   . PVC's (premature ventricular contractions)   . Rectocele   . Tubular adenoma of colon     Family History  Problem Relation Age of Onset  . Alzheimer's disease Father   . Heart disease Father        mitral valve replaced. unknown reason  . Breast cancer Mother        15  . Colon polyps Mother   . Diabetes Mother   . Heart disease Mother 79       CHF, MI 22  . Heart attack Mother        x 2  . Prostate cancer Brother   . Colon cancer Neg Hx     Past Surgical History:  Procedure Laterality Date  . ABDOMINAL HERNIA REPAIR Right    incisional  . ABDOMINAL HYSTERECTOMY     with ovaries  . BACK SURGERY  2009   L4-L5 with rods  . BLADDER SUSPENSION N/A 04/09/2017   Procedure: TRANSVAGINAL TAPE (TVT) PROCEDURE;  Surgeon: Bobbye Charleston, MD;  Location: [facility];  Service: Gynecology;  Laterality: N/A;  . CATARACT EXTRACTION, BILATERAL    . cataract left lens replacement     2016  . COLONOSCOPY    . COLONOSCOPY N/A 08/29/2017   Procedure: COLONOSCOPY;  Surgeon: Jerene Bears, MD;  Location: [facility];  Service: Gastroenterology;  Laterality: N/A;  . CYSTOCELE REPAIR N/A 04/09/2017   Procedure: ANTERIOR REPAIR (CYSTOCELE);  Surgeon: Bobbye Charleston, MD;  Location: [facility];  Service: Gynecology;  Laterality: N/A;  . CYSTOSCOPY N/A 04/09/2017   Procedure: CYSTOSCOPY;  Surgeon: Bobbye Charleston, MD;  Location: [facility];  Service: Gynecology;  Laterality: N/A;  . EYE MUSCLE SURGERY    . GLAUCOMA SURGERY Left  05/12/14  . incisional ab  05/28/11  . JOINT REPLACEMENT     right hip replacement   . LUMBAR FUSION     L4-L5  . PARTIAL KNEE ARTHROPLASTY Right   . PARTIAL KNEE ARTHROPLASTY Left   . perforated deverticular abscess/laporotomy and colostomy     depression  after this  . POLYPECTOMY N/A 08/29/2017   Procedure: POLYPECTOMY;  Surgeon: Jerene Bears, MD;  Location: [facility];  Service: Gastroenterology;  Laterality: N/A;  . stribismus eye surgery Right   . take down of colostomy    . tonsillectomly    . TONSILLECTOMY    . TOTAL HIP ARTHROPLASTY Right   . TOTAL KNEE REVISION Left 11/24/2015   Procedure: Conversion from left lateral unicompartmental knee arthroplasty to left total knee arthroplasty;  Surgeon: Mcarthur Rossetti, MD;  Location: [facility];  Service: Orthopedics;  Laterality: Left;   Social History   Occupational History  . Occupation: cpa/retired    Fish farm manager: Retired  Tobacco Use  . Smoking status: Never Smoker  . Smokeless tobacco: Never Used  Substance and Sexual Activity  . Alcohol use: Yes    Alcohol/week: 4.2 oz    Types: 7 Glasses of wine per week    Comment: 1 glass of wine daily   . Drug use: No  . Sexual activity: Yes    Birth control/protection: Post-menopausal

## 2018-04-07 DIAGNOSIS — Z01419 Encounter for gynecological examination (general) (routine) without abnormal findings: Secondary | ICD-10-CM | POA: Diagnosis not present

## 2018-04-07 DIAGNOSIS — E8941 Symptomatic postprocedural ovarian failure: Secondary | ICD-10-CM | POA: Diagnosis not present

## 2018-04-23 ENCOUNTER — Ambulatory Visit (INDEPENDENT_AMBULATORY_CARE_PROVIDER_SITE_OTHER): Payer: Medicare Other

## 2018-04-23 ENCOUNTER — Ambulatory Visit (INDEPENDENT_AMBULATORY_CARE_PROVIDER_SITE_OTHER): Payer: Medicare Other | Admitting: Orthopaedic Surgery

## 2018-04-23 ENCOUNTER — Encounter (INDEPENDENT_AMBULATORY_CARE_PROVIDER_SITE_OTHER): Payer: Self-pay | Admitting: Orthopaedic Surgery

## 2018-04-23 ENCOUNTER — Ambulatory Visit (INDEPENDENT_AMBULATORY_CARE_PROVIDER_SITE_OTHER): Payer: Medicare Other | Admitting: Physician Assistant

## 2018-04-23 DIAGNOSIS — Z96652 Presence of left artificial knee joint: Secondary | ICD-10-CM

## 2018-04-23 DIAGNOSIS — M7022 Olecranon bursitis, left elbow: Secondary | ICD-10-CM | POA: Diagnosis not present

## 2018-04-23 NOTE — Progress Notes (Signed)
Office Visit Note   Patient: Tanya Leon           Date of Birth: June 21, 1941           MRN: [id number] Visit Date: 04/23/2018              Requested by: Marin Olp, MD [place], [place] [address] PCP: Marin Olp, MD   Assessment & Plan: Visit Diagnoses:  1. Hx of total knee arthroplasty, left   2. Olecranon bursitis of left elbow     Plan: I was able to aspirate fluid from the olecranon bursa of her left elbow that was definitely consistent with an inflammatory process.  I then place a steroid in this area and a compressive dressing.  I gave her reassurance about her knees as well.  Follow-up will be as needed for all these issues.  All question concerns were answered and addressed.  Follow-Up Instructions: Return if symptoms worsen or fail to improve.   Orders:  Orders Placed This Encounter  Procedures  . Hand/UE Inj  . XR Knee 1-2 Views Left   No orders of the defined types were placed in this encounter.     Procedures: No procedures performed   Clinical Data: No additional findings.   Subjective: Chief Complaint  Patient presents with  . Left Elbow - Follow-up  The patient is here for follow-up after having an elbow drained at the olecranon bursa for olecranon bursitis.  She has no pain at all but the fluid has reaccumulated and she like to consider having it drained again.  She also has a history of a right unicompartmental knee replacement was done elsewhere.  That was of the lateral compartment and she is had no issues with that.  We actually performed a conversion of her left unicompartmental knee that was done elsewhere to a total knee due to continued arthritis throughout her knee.  She said her knees are doing well.  We will work on x-ray her left knee routinely today as well.  She denies any acute changes in her medical status.  HPI  Review of Systems She currently denies any headache, chest pain, shortness of breath,  fever, chills, nausea, vomiting.  Objective: Vital Signs: There were no vitals taken for this visit.  Physical Exam She is alert and oriented x3 and in no acute distress Ortho Exam Examination of her left elbow shows swelling around the olecranon bursa with fluid collection but no redness.  This is consistent with continued olecranon bursitis.  Both her knees have excellent range of motion.  Both knees have no pain on range of motion.  Both knees are ligamentously stable. Specialty Comments:  No specialty comments available.  Imaging: No results found.   PMFS History: Patient Active Problem List   Diagnosis Date Noted  . Poor posture 02/02/2018  . Pulmonary hypertension, unspecified ([place]) 07/01/2017  . Cervical arthritis 06/30/2017  . PVC's (premature ventricular contractions) 03/26/2017  . 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 Thrombocytopenia ([place]) 05/03/2015  . Basal cell carcinoma 07/07/2014  . 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 Glaucoma 06/01/2014  . Depression 06/01/2014  . 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 COLONIC POLYPS 01/13/2009  . 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 SPINAL STENOSIS, LUMBAR 01/25/2008  . INSOMNIA, CHRONIC 07/22/2007  . ALLERGIC RHINITIS 05/21/2007  . Osteopenia 02/26/2007   Past Medical History:  Diagnosis Date  . Arthritis   .  Cancer (HCC)    hx of skin cancer   . Colon polyps    adenomatous  . Complication of anesthesia    " pt stated they gave me too much - kept having to be reminded to breathe   . Constipation    opiod induced associated with back pain  . Depression    hx of   . Diverticulosis   . DIVERTICULOSIS, COLON 01/13/2009  . Headache    history of migraines  . Heart murmur    benign, dagnosed around age 20,prophylactic antibiotic  . Low back pain   . Lumbar spondylolysis   . Osteopenia   . Pneumonia    hx of as a baby   . PVC's (premature ventricular contractions)    . Rectocele   . Tubular adenoma of colon     Family History  Problem Relation Age of Onset  . Alzheimer's disease Father   . Heart disease Father        mitral valve replaced. unknown reason  . Breast cancer Mother        9  . Colon polyps Mother   . Diabetes Mother   . Heart disease Mother 46       CHF, MI 83  . Heart attack Mother        x 2  . Prostate cancer Brother   . Colon cancer Neg Hx     Past Surgical History:  Procedure Laterality Date  . ABDOMINAL HERNIA REPAIR Right    incisional  . ABDOMINAL HYSTERECTOMY     with ovaries  . BACK SURGERY  2009   L4-L5 with rods  . BLADDER SUSPENSION N/A 04/09/2017   Procedure: TRANSVAGINAL TAPE (TVT) PROCEDURE;  Surgeon: Bobbye Charleston, MD;  Location: [facility];  Service: Gynecology;  Laterality: N/A;  . CATARACT EXTRACTION, BILATERAL    . cataract left lens replacement     2016  . COLONOSCOPY    . COLONOSCOPY N/A 08/29/2017   Procedure: COLONOSCOPY;  Surgeon: Jerene Bears, MD;  Location: [facility];  Service: Gastroenterology;  Laterality: N/A;  . CYSTOCELE REPAIR N/A 04/09/2017   Procedure: ANTERIOR REPAIR (CYSTOCELE);  Surgeon: Bobbye Charleston, MD;  Location: [facility];  Service: Gynecology;  Laterality: N/A;  . CYSTOSCOPY N/A 04/09/2017   Procedure: CYSTOSCOPY;  Surgeon: Bobbye Charleston, MD;  Location: [facility];  Service: Gynecology;  Laterality: N/A;  . EYE MUSCLE SURGERY    . GLAUCOMA SURGERY Left 05/12/14  . incisional ab  05/28/11  . JOINT REPLACEMENT     right hip replacement   . LUMBAR FUSION     L4-L5  . PARTIAL KNEE ARTHROPLASTY Right   . PARTIAL KNEE ARTHROPLASTY Left   . perforated deverticular abscess/laporotomy and colostomy     depression after this  . POLYPECTOMY N/A 08/29/2017   Procedure: POLYPECTOMY;  Surgeon: Jerene Bears, MD;  Location: [facility];  Service: Gastroenterology;  Laterality: N/A;  . stribismus eye surgery Right   . take down of colostomy    . tonsillectomly    .  TONSILLECTOMY    . TOTAL HIP ARTHROPLASTY Right   . TOTAL KNEE REVISION Left 11/24/2015   Procedure: Conversion from left lateral unicompartmental knee arthroplasty to left total knee arthroplasty;  Surgeon: Mcarthur Rossetti, MD;  Location: [facility];  Service: Orthopedics;  Laterality: Left;

## 2018-05-26 ENCOUNTER — Encounter: Payer: Self-pay | Admitting: Family Medicine

## 2018-06-08 DIAGNOSIS — H401132 Primary open-angle glaucoma, bilateral, moderate stage: Secondary | ICD-10-CM | POA: Diagnosis not present

## 2018-06-08 DIAGNOSIS — H353132 Nonexudative age-related macular degeneration, bilateral, intermediate dry stage: Secondary | ICD-10-CM | POA: Diagnosis not present

## 2018-06-08 DIAGNOSIS — H35372 Puckering of macula, left eye: Secondary | ICD-10-CM | POA: Diagnosis not present

## 2018-06-08 DIAGNOSIS — H35371 Puckering of macula, right eye: Secondary | ICD-10-CM | POA: Diagnosis not present

## 2018-06-15 ENCOUNTER — Other Ambulatory Visit: Payer: Self-pay | Admitting: Family Medicine

## 2018-06-30 NOTE — Progress Notes (Signed)
PCP notes:   Health maintenance: Flu: Received today   Abnormal screenings: None.    Patient concerns: Patient states she has not taken zolpidem since June - does she still need this  Bruises easily, around eye where glasses lay, hand bruise from hitting on counter.    Nurse concerns: None.  Next PCP appt: Same day.

## 2018-06-30 NOTE — Progress Notes (Signed)
Subjective:   Tanya FORDHAM is a 77 y.o. female who presents for Medicare Annual (Subsequent) preventive examination.  Lives in one story single family home with husband.   Review of Systems:  No ROS.  Medicare Wellness Visit. Additional risk factors are reflected in the social history.  Sleeps 8 hrs/night. Wakes up feeling rested. Occassional naps. States she has good energy during the day.   Cardiac Risk Factors include: advanced age (>65men, >70 women)     Objective:     Vitals: BP 122/72 (BP Location: Left Arm, Patient Position: Sitting, Cuff Size: Normal)   Pulse 61   Temp (!) 97.4 F (36.3 C) (Oral)   Resp 16   Ht 5\' 2"  (1.575 m)   Wt 135 lb (61.2 kg)   SpO2 98%   BMI 24.69 kg/m   Body mass index is 24.69 kg/m.  Advanced Directives 07/01/2018 08/29/2017 06/30/2017 04/03/2017 11/24/2015 11/15/2015  Does Patient Have a Medical Advance Directive? Yes Yes Yes Yes Yes Yes  Type of Paramedic of [place];Living will [place];Living will [place];Living will [place];Living will Living will;Healthcare Power of Attorney Living will;Healthcare Power of Attorney  Does patient want to make changes to medical advance directive? No - Patient declined - No - Patient declined No - Patient declined No - Patient declined No - Patient declined  Copy of [place] in Chart? No - copy requested Yes No - copy requested No - copy requested No - copy requested No - copy requested    Tobacco Social History   Tobacco Use  Smoking Status Never Smoker  Smokeless Tobacco Never Used     Counseling given: Not Answered   Past Medical History:  Diagnosis Date  . Allergy 1973   Seasonal  . Arthritis   . Blood transfusion without reported diagnosis 08-15-08   Following hip replacement  . Cancer (HCC)    hx of skin cancer   . Cataract 1998   Cataract surgery both eyes  . Colon polyps    adenomatous  . Complication of anesthesia    " pt stated they gave me too much - kept having to be reminded to breathe   . Constipation    opiod induced associated with back pain  . Depression    hx of   . Diverticulosis   . DIVERTICULOSIS, COLON 01/13/2009  . Glaucoma 05-12-14  . Headache    history of migraines  . Heart murmur    benign, dagnosed around age 62,prophylactic antibiotic  . Low back pain   . Lumbar spondylolysis   . Osteopenia   . Pneumonia    hx of as a baby   . PVC's (premature ventricular contractions)   . Rectocele   . Tubular adenoma of colon    Past Surgical History:  Procedure Laterality Date  . ABDOMINAL HERNIA REPAIR Right    incisional  . ABDOMINAL HYSTERECTOMY     with ovaries  . BACK SURGERY  2009   L4-L5 with rods  . BLADDER SUSPENSION N/A 04/09/2017   Procedure: TRANSVAGINAL TAPE (TVT) PROCEDURE;  Surgeon: Bobbye Charleston, MD;  Location: [facility];  Service: Gynecology;  Laterality: N/A;  . CATARACT EXTRACTION, BILATERAL    . cataract left lens replacement     2016  . COLON SURGERY  2007   Rpr abscessed ruptured diverticuli  . COLONOSCOPY    . COLONOSCOPY N/A 08/29/2017   Procedure: COLONOSCOPY;  Surgeon:  Pyrtle, Lajuan Lines, MD;  Location: [facility];  Service: Gastroenterology;  Laterality: N/A;  . CYSTOCELE REPAIR N/A 04/09/2017   Procedure: ANTERIOR REPAIR (CYSTOCELE);  Surgeon: Bobbye Charleston, MD;  Location: [facility];  Service: Gynecology;  Laterality: N/A;  . CYSTOSCOPY N/A 04/09/2017   Procedure: CYSTOSCOPY;  Surgeon: Bobbye Charleston, MD;  Location: [facility];  Service: Gynecology;  Laterality: N/A;  . EYE MUSCLE SURGERY    . FRACTURE SURGERY  11-13-17   L wrist-open reduction internal fixation  . GLAUCOMA SURGERY Left 05/12/14  . HERNIA REPAIR  05-28-11   Hernia result of 2007 colon surg  . incisional ab  05/28/11  . JOINT REPLACEMENT     right hip replacement   . LUMBAR FUSION     L4-L5  . PARTIAL KNEE ARTHROPLASTY Right   . PARTIAL  KNEE ARTHROPLASTY Left   . perforated deverticular abscess/laporotomy and colostomy     depression after this  . POLYPECTOMY N/A 08/29/2017   Procedure: POLYPECTOMY;  Surgeon: Jerene Bears, MD;  Location: [facility];  Service: Gastroenterology;  Laterality: N/A;  . SPINE SURGERY  02-16-08   Fusion L4L5  . stribismus eye surgery Right   . take down of colostomy    . tonsillectomly    . TONSILLECTOMY    . TOTAL HIP ARTHROPLASTY Right   . TOTAL KNEE REVISION Left 11/24/2015   Procedure: Conversion from left lateral unicompartmental knee arthroplasty to left total knee arthroplasty;  Surgeon: Mcarthur Rossetti, MD;  Location: [facility];  Service: Orthopedics;  Laterality: Left;  . TUBAL LIGATION  1976   Family History  Problem Relation Age of Onset  . Alzheimer's disease Father   . Heart disease Father        mitral valve replaced. unknown reason  . Breast cancer Mother        93  . Colon polyps Mother   . Diabetes Mother   . Heart disease Mother 90       CHF, MI 71  . Heart attack Mother        x 2  . Arthritis Mother   . Cancer Mother   . Hearing loss Mother   . Hypertension Mother   . Prostate cancer Brother   . Cancer Brother   . Hearing loss Brother   . Hypertension Brother   . Asthma Paternal Uncle   . Hypertension Sister   . Colon cancer Neg Hx    Social History   Socioeconomic History  . Marital status: Married    Spouse name: Not on file  . Number of children: 2  . Years of education: Not on file  . Highest education level: Bachelor's degree (e.g., BA, AB, BS)  Occupational History  . 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 Financial resource strain: Not on file  . Food insecurity:    Worry: Not on file    Inability: Not on file  . Transportation needs:    Medical: Not on file    Non-medical: Not on file  Tobacco Use  . Smoking status: Never Smoker  . Smokeless tobacco: Never Used  Substance and Sexual Activity  . Alcohol  use: Yes    Alcohol/week: 7.0 standard drinks    Types: 7 Glasses of wine per week    Comment: 1 glass wine with dinner most evenings  . Drug use: No  . Sexual activity: Yes    Birth control/protection: Post-menopausal  Lifestyle  . Physical activity:  Days per week: Not on file    Minutes per session: Not on file  . Stress: Not on file  Relationships  . Social connections:    Talks on phone: Not on file    Gets together: Not on file    Attends religious service: Not on file    Active member of club or organization: Not on file    Attends meetings of clubs or organizations: Not on file    Relationship status: Not on file  Other Topics Concern  . Not on file  Social History Narrative   Married 74 years in 2015. 2 kids. 4 grandkids.       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 Previously [facility].     Outpatient Encounter Medications as of 07/01/2018  Medication Sig  . AMBULATORY NON FORMULARY MEDICATION Evening Primerose Oil 1300 mg take 1 by mouth twice a day  . aspirin-acetaminophen-caffeine (EXCEDRIN EXTRA STRENGTH) 250-250-65 MG per tablet Take 2 tablets by mouth every 6 (six) hours as needed for headache. Reported on 11/03/2015  . Azelaic Acid (FINACEA) 15 % cream Apply 1 application topically 2 (two) times daily. After skin is thoroughly washed and patted dry, gently but thoroughly massage a thin film of azelaic acid cream into the affected area twice daily, in the morning and evening.  Marland Kitchen azithromycin (AZASITE) 1 % ophthalmic solution Place 1 drop into both eyes daily. Two weeks each month  . carboxymethylcellulose (REFRESH PLUS) 0.5 % SOLN Place 1 drop into both eyes daily as needed (Dry eyes.).   Marland Kitchen Cholecalciferol (VITAMIN D) 1000 UNITS capsule Take 2,000 Units by mouth every evening.   . Ciclopirox 1 % shampoo Apply 1 each topically every Friday.   . cycloSPORINE (RESTASIS) 0.05 % ophthalmic emulsion Place 1 drop into both  eyes 2 (two) times daily.   . dorzolamide (TRUSOPT) 2 % ophthalmic solution Place 1 drop into the left eye 2 (two) times daily.  . DULoxetine (CYMBALTA) 60 MG capsule Take 1 capsule (60 mg total) by mouth daily.  Marland Kitchen estradiol (VIVELLE-DOT) 0.1 MG/24HR patch Place 1 patch onto the skin 2 (two) times a week. Reported on 11/03/2015 on thursdays and Sundays  . fluticasone (FLONASE) 50 MCG/ACT nasal spray SHAKE LIQUID AND USE 2 SPRAYS IN EACH NOSTRIL DAILY (Patient taking differently: 1 spray. SHAKE LIQUID AND USE 2 SPRAYS IN EACH NOSTRIL DAILY)  . Lutein 20 MG TABS Take 1 tablet by mouth 2 (two) times daily.   . Multiple Vitamins-Minerals (ICAPS AREDS 2) CAPS Take 1 capsule by mouth 2 (two) times daily.   . Multiple Vitamins-Minerals (MULTIVITAMIN PO) Take 1 tablet by mouth every morning.   . neomycin-polymyxin b-dexamethasone (MAXITROL) 3.5-10000-0.1 OINT Place 1 application into both eyes as needed. No more than twice a week  . NONFORMULARY OR COMPOUNDED ITEM Apply 1-2 g topically daily. [employer] Nail lacquer: Fluconazole 2%, Terbinafine 1%, DMSO  . olopatadine (PATANOL) 0.1 % ophthalmic solution Place 1 drop into both eyes 2 (two) times daily.  Marland Kitchen omega-3 acid ethyl esters (LOVAZA) 1 G capsule Take 1 g by mouth 2 (two) times daily.   . polyethylene glycol powder (GLYCOLAX/MIRALAX) powder Take 17 g by mouth every morning.   . pregabalin (LYRICA) 25 MG capsule TAKE 1 CAPSULE IN THE MORNING AND TAKE 2 TABLETS IN THE EVENING (Patient taking differently: Take 25 mg by mouth daily. TAKE 1 CAPSULE IN THE MORNING AND TAKE 2 TABLETS IN THE  EVENING)  . tiZANidine (ZANAFLEX) 2 MG tablet TAKE 1 TABLET BY MOUTH AT BEDTIME.  Marland Kitchen amoxicillin (AMOXIL) 500 MG capsule daily as needed (With dental work).   . zolpidem (AMBIEN) 5 MG tablet Take 1 tablet (5 mg total) by mouth at bedtime as needed. for sleep (Patient not taking: Reported on 07/01/2018)  . [DISCONTINUED] Lactobacillus (MORE-DOPHILUS ACIDOPHILUS PO) Take 1 capsule  by mouth daily.  . [DISCONTINUED] methylPREDNISolone (MEDROL) 4 MG tablet Medrol dose pack. Take as instructed  . [DISCONTINUED] SHINGRIX injection TO BE ADMINISTERED BY PHARMACIST   No facility-administered encounter medications on file as of 07/01/2018.     Activities of Daily Living In your present state of health, do you have any difficulty performing the following activities: 07/01/2018  Hearing? N  Vision? N  Difficulty concentrating or making decisions? N  Walking or climbing stairs? N  Dressing or bathing? N  Doing errands, shopping? N  Preparing Food and eating ? N  Using the Toilet? N  In the past six months, have you accidently leaked urine? N  Do you have problems with loss of bowel control? N  Managing your Medications? N  Managing your Finances? N  Housekeeping or managing your Housekeeping? N  Some recent data might be hidden    Patient Care Team: Marin Olp, MD as PCP - General (Family Medicine) Monna Fam, MD as Consulting Physician (Ophthalmology) Zadie Rhine Clent Demark, MD as Consulting Physician (Ophthalmology) Bobbye Charleston, MD as Consulting Physician (Obstetrics and Gynecology) [name], MD as Consulting Physician (Cardiology) Pyrtle, Lajuan Lines, MD as Consulting Physician (Gastroenterology) Jari Pigg, MD as Consulting Physician (Dermatology) Mcarthur Rossetti, MD as Consulting Physician (Orthopedic Surgery) Trula Slade, DPM as Consulting Physician (Podiatry) Earley Favor as Consulting Physician (Dentistry) Vicie Mutters, MD as Consulting Physician (Otolaryngology)    Assessment:   This is a routine wellness examination for [place].  Exercise Activities and Dietary recommendations Current Exercise Habits: Structured exercise class, Type of exercise: Other - see comments;strength training/weights(chair aerobics. physical therapy twice/mont[facility]), Time (Minutes): 45, Frequency (Times/Week): 1, Weekly Exercise (Minutes/Week): 45,  Intensity: Mild, Exercise limited by: None identified  Drinks at least 6 glasses water/day. Also drinks orange juice occasionally. Eats out once or twice/week.  Breakfast: Berniece Salines and toast or dry cereal. 1.5 cups decaf/day.  Omelettes on the weekends.  Lunch: Leftovers or chicken.  Dinner: Fish, starch and vegetables  Snack: one scoop ice cream, crackers, nuts   Goals    . Be more active, build up strength, lose 5 lbs.    . Gait and mobility and working on posture.        Fall Risk Fall Risk  07/01/2018 06/30/2017 06/04/2016 05/30/2015 01/24/2014  Falls in the past year? Yes No No No No  Number falls in past yr: 1 - - - -  Injury with Fall? Yes - - - -  Comment Fell in physical therapy, states she was tired and fell  - - - -  Risk Factor Category  High Fall Risk - - - -  Follow up Education provided;Falls prevention discussed - - - -   Depression Screen PHQ 2/9 Scores 07/01/2018 02/02/2018 06/30/2017 06/04/2016  PHQ - 2 Score 0 0 0 0  PHQ- 9 Score 0 0 0 -   PHQ2 and PHQ9 completed. Score 0. Last completed in May.  Cognitive Function MMSE - Mini Mental State Exam 06/30/2017  Orientation to time 5  Orientation to Place 5  Registration 3  Attention/ Calculation 5  Recall 3  Language- name 2 objects 2  Language- repeat 1  Language- follow 3 step command 3  Language- read & follow direction 1  Write a sentence 1  Copy design 1  Total score 30     Ad8 score reviewed for issues:  Issues making decisions:no  Less interest in hobbies / activities:no  Repeats questions, stories (family complaining):no  Trouble using ordinary gadgets (microwave, computer, phone):no  Forgets the month or year: no  Mismanaging finances: no  Remembering appts:no  Daily problems with thinking and/or memory:no Ad8 score is=0 Patient does sodoku, finances, word searches.     Immunization History  Administered Date(s) Administered  . H1N1 09/13/2008  . Influenza Whole 06/24/2008, 06/26/2009,  08/01/2010  . Influenza, High Dose Seasonal PF 07/10/2016, 06/30/2017, 07/01/2018  . Influenza, Seasonal, Injecte, Preservative Fre 07/06/2013  . Influenza,inj,Quad PF,6+ Mos 07/07/2014, 07/11/2015  . Influenza-Unspecified 06/30/2016  . Pneumococcal Conjugate-13 05/30/2015  . Pneumococcal Polysaccharide-23 12/30/2005  . Td 08/01/2010  . Zoster 10/08/2007  . Zoster Recombinat (Shingrix) 08/05/2017, 10/06/2017   Screening Tests Health Maintenance  Topic Date Due  . INFLUENZA VACCINE  04/30/2018  . TETANUS/TDAP  08/01/2020  . COLONOSCOPY  08/29/2020  . DEXA SCAN  Completed  . PNA vac Low Risk Adult  Completed      Plan:   Follow up with PCP as directed.  I have personally reviewed and noted the following in the patient's chart:   . Medical and social history . Use of alcohol, tobacco or illicit drugs  . Current medications and supplements . Functional ability and status . Nutritional status . Physical activity . Advanced directives . List of other physicians . Vitals . Screenings to include cognitive, depression, and falls . Referrals and appointments  In addition, I have reviewed and discussed with patient certain preventive protocols, quality metrics, and best practice recommendations. A written personalized care plan for preventive services as well as general preventive health recommendations were provided to patient.     Williemae Area, RN  07/01/2018

## 2018-07-01 ENCOUNTER — Encounter: Payer: Self-pay | Admitting: Family Medicine

## 2018-07-01 ENCOUNTER — Ambulatory Visit (INDEPENDENT_AMBULATORY_CARE_PROVIDER_SITE_OTHER): Payer: Medicare Other | Admitting: Family Medicine

## 2018-07-01 ENCOUNTER — Ambulatory Visit (INDEPENDENT_AMBULATORY_CARE_PROVIDER_SITE_OTHER): Payer: Medicare Other | Admitting: *Deleted

## 2018-07-01 VITALS — BP 122/72 | HR 61 | Temp 97.4°F | Resp 16 | Ht 62.0 in | Wt 135.0 lb

## 2018-07-01 DIAGNOSIS — Z23 Encounter for immunization: Secondary | ICD-10-CM | POA: Diagnosis not present

## 2018-07-01 DIAGNOSIS — Z0000 Encounter for general adult medical examination without abnormal findings: Secondary | ICD-10-CM | POA: Diagnosis not present

## 2018-07-01 DIAGNOSIS — D696 Thrombocytopenia, unspecified: Secondary | ICD-10-CM | POA: Diagnosis not present

## 2018-07-01 DIAGNOSIS — M48061 Spinal stenosis, lumbar region without neurogenic claudication: Secondary | ICD-10-CM | POA: Diagnosis not present

## 2018-07-01 DIAGNOSIS — D692 Other nonthrombocytopenic purpura: Secondary | ICD-10-CM | POA: Diagnosis not present

## 2018-07-01 DIAGNOSIS — I272 Pulmonary hypertension, unspecified: Secondary | ICD-10-CM

## 2018-07-01 DIAGNOSIS — F325 Major depressive disorder, single episode, in full remission: Secondary | ICD-10-CM

## 2018-07-01 DIAGNOSIS — G4700 Insomnia, unspecified: Secondary | ICD-10-CM | POA: Diagnosis not present

## 2018-07-01 LAB — CBC
HCT: 44.8 % (ref 36.0–46.0)
HEMOGLOBIN: 15 g/dL (ref 12.0–15.0)
MCHC: 33.6 g/dL (ref 30.0–36.0)
MCV: 92.5 fl (ref 78.0–100.0)
PLATELETS: 158 10*3/uL (ref 150.0–400.0)
RBC: 4.84 Mil/uL (ref 3.87–5.11)
RDW: 13.5 % (ref 11.5–15.5)
WBC: 5 10*3/uL (ref 4.0–10.5)

## 2018-07-01 LAB — COMPREHENSIVE METABOLIC PANEL
ALT: 12 U/L (ref 0–35)
AST: 15 U/L (ref 0–37)
Albumin: 4.3 g/dL (ref 3.5–5.2)
Alkaline Phosphatase: 55 U/L (ref 39–117)
BUN: 14 mg/dL (ref 6–23)
CALCIUM: 9.7 mg/dL (ref 8.4–10.5)
CHLORIDE: 100 meq/L (ref 96–112)
CO2: 30 meq/L (ref 19–32)
Creatinine, Ser: 0.73 mg/dL (ref 0.40–1.20)
GFR: 81.83 mL/min (ref >60.00)
Glucose, Bld: 89 mg/dL (ref 70–99)
Potassium: 4.8 mEq/L (ref 3.5–5.1)
Sodium: 136 mEq/L (ref 135–145)
Total Bilirubin: 1 mg/dL (ref 0.2–1.2)
Total Protein: 7.1 g/dL (ref 6.0–8.3)

## 2018-07-01 NOTE — Assessment & Plan Note (Signed)
S: Patient is on ambien 5mg  for sleep- has not been using since June and still able to sleep reasonably well. Gets up 2-3 hours to pee but can go right back to sleep A/P: I told patient this was fantastic news- can remain off ambien and just keep a few on hand in case future issues.

## 2018-07-01 NOTE — Assessment & Plan Note (Signed)
S: has noticed easy bruising for several years. Bruises from where glasses lay on face, easy bruising on hands or arms if bumps them.  A/P:discussed thinning subcutaneous fat. Very mild thrombocytopenia may contribute- update CBC

## 2018-07-01 NOTE — Assessment & Plan Note (Signed)
Mild pulmonary hypertension followed by Dr. Gwenlyn Found. Plans for repeat echocardiogram within next month or two. No worsening symptoms/stable.

## 2018-07-01 NOTE — Progress Notes (Signed)
I have reviewed and agree with note, evaluation, plan.  See my separate note from today  Stephen Hunter, MD  

## 2018-07-01 NOTE — Progress Notes (Signed)
Subjective:  Tanya Leon is a 77 y.o. year old very pleasant female patient who presents for/with See problem oriented charting ROS- still dealing with neck and back pain- not worse on lower dose lyrica. No chest pain.  shortness of breath only if really exerts herself. Occasional sinus headaches- or related to neck- has headaches for years. blurry vision. Feels posture is poor.    Past Medical History-  Patient Active Problem List   Diagnosis Date Noted  . Poor posture 02/02/2018    Priority: Medium  . Pulmonary hypertension, unspecified ([place]) 07/01/2017    Priority: Medium  . Cervical arthritis 06/30/2017    Priority: Medium  . PVC's (premature ventricular contractions) 03/26/2017    Priority: Medium  . Basal cell carcinoma 07/07/2014    Priority: Medium  . Depression 06/01/2014    Priority: Medium  . SPINAL STENOSIS, LUMBAR 01/25/2008    Priority: Medium  . INSOMNIA, CHRONIC 07/22/2007    Priority: Medium  . 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 Thrombocytopenia ([place]) 05/03/2015    Priority: Low  . 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 Glaucoma 06/01/2014    Priority: Low  . 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 COLONIC POLYPS 01/13/2009    Priority: Low  . 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 ALLERGIC RHINITIS 05/21/2007    Priority: Low  . Osteopenia 02/26/2007    Priority: Low    Medications- reviewed and updated Current Outpatient Medications  Medication Sig Dispense Refill  . AMBULATORY NON FORMULARY MEDICATION Evening Primerose Oil 1300 mg take 1 by mouth twice a day    . amoxicillin (AMOXIL) 500 MG capsule daily as needed (With dental work).   0  . aspirin-acetaminophen-caffeine (EXCEDRIN EXTRA STRENGTH) 884-166-06 MG per tablet Take 2 tablets by mouth every 6 (six) hours as needed for  headache. Reported on 11/03/2015    . Azelaic Acid (FINACEA) 15 % cream Apply 1 application topically 2 (two) times daily. After skin is thoroughly washed and patted dry, gently but thoroughly massage a thin film of azelaic acid cream into the affected area twice daily, in the morning and evening.    Marland Kitchen azithromycin (AZASITE) 1 % ophthalmic solution Place 1 drop into both eyes daily. Two weeks each month    . carboxymethylcellulose (REFRESH PLUS) 0.5 % SOLN Place 1 drop into both eyes daily as needed (Dry eyes.).     Marland Kitchen Cholecalciferol (VITAMIN D) 1000 UNITS capsule Take 2,000 Units by mouth every evening.     . Ciclopirox 1 % shampoo Apply 1 each topically every Friday.     . cycloSPORINE (RESTASIS) 0.05 % ophthalmic emulsion Place 1 drop into both eyes 2 (two) times daily.     . dorzolamide (TRUSOPT) 2 % ophthalmic solution Place 1 drop into the left eye 2 (two) times daily.  5  . DULoxetine (CYMBALTA) 60 MG capsule Take 1 capsule (60 mg total) by mouth daily. 90 capsule 3  . estradiol (VIVELLE-DOT) 0.1 MG/24HR patch Place 1 patch onto the skin 2 (two) times a week. Reported on 11/03/2015 on thursdays and Sundays    . fluticasone (FLONASE) 50 MCG/ACT nasal spray SHAKE LIQUID AND USE 2 SPRAYS IN EACH NOSTRIL DAILY (Patient taking differently: 1 spray. SHAKE LIQUID AND USE 2 SPRAYS IN EACH NOSTRIL  DAILY) 48 g 3  . Lutein 20 MG TABS Take 1 tablet by mouth 2 (two) times daily.     . Multiple Vitamins-Minerals (ICAPS AREDS 2) CAPS Take 1 capsule by mouth 2 (two) times daily.     . Multiple Vitamins-Minerals (MULTIVITAMIN PO) Take 1 tablet by mouth every morning.     . neomycin-polymyxin b-dexamethasone (MAXITROL) 3.5-10000-0.1 OINT Place 1 application into both eyes as needed. No more than twice a week  0  . NONFORMULARY OR COMPOUNDED ITEM Apply 1-2 g topically daily. [employer] Nail lacquer: Fluconazole 2%, Terbinafine 1%, DMSO 120 each 11  . olopatadine (PATANOL) 0.1 % ophthalmic solution Place 1 drop into  both eyes 2 (two) times daily.    Marland Kitchen omega-3 acid ethyl esters (LOVAZA) 1 G capsule Take 1 g by mouth 2 (two) times daily.     . polyethylene glycol powder (GLYCOLAX/MIRALAX) powder Take 17 g by mouth every morning.     . pregabalin (LYRICA) 25 MG capsule TAKE 1 CAPSULE IN THE MORNING AND TAKE 2 TABLETS IN THE EVENING (Patient taking differently: Take 25 mg by mouth daily. TAKE 1 CAPSULE IN THE MORNING AND TAKE 2 TABLETS IN THE EVENING) 270 capsule 1  . tiZANidine (ZANAFLEX) 2 MG tablet TAKE 1 TABLET BY MOUTH AT BEDTIME. 90 tablet 1  . zolpidem (AMBIEN) 5 MG tablet Take 1 tablet (5 mg total) by mouth at bedtime as needed. for sleep (Patient not taking: Reported on 07/01/2018) 30 tablet 5   No current facility-administered medications for this visit.     Objective: BP 122/72   Pulse 61   Temp (!) 97.4 F (36.3 C) (Oral)   Resp 16   Ht 5\' 2"  (1.575 m)   Wt 135 lb (61.2 kg)   SpO2 98%   BMI 24.69 kg/m  Gen: NAD, resting comfortably CV: RRR no murmurs rubs or gallops Lungs: CTAB no crackles, wheeze, rhonchi Abdomen: soft/nontender/nondistended/normal bowel sounds.   Ext: no edema Skin: warm, dry, bruising   Assessment/Plan:  Other notes: 1.she reflects back on fatigue- and mainly with dealing with neck or back pain.  2. Wants to do q3 year lipid panels at least. Will check next year- excellent #s two years ago.  Lab Results  Component Value Date   CHOL 180 06/04/2016   HDL 90.00 06/04/2016   LDLCALC 80 06/04/2016   TRIG 47.0 06/04/2016   CHOLHDL 2 06/04/2016  3. Still doing mammograms with Dr. Philis Pique. Still does pelvic exams.  4. Osteopenia followed by Dr. Philis Pique. Discussed weight bearing exercise importance.   INSOMNIA, CHRONIC S: Patient is on ambien 5mg  for sleep- has not been using since June and still able to sleep reasonably well. Gets up 2-3 hours to pee but can go right back to sleep A/P: I told patient this was fantastic news- can remain off ambien and just keep a  few on hand in case future issues.   SPINAL STENOSIS, LUMBAR S: Patient with continued issues from spinal stenosis in lumbar spine.  Patient was on 1 capsules of 25mg  lyrica in AM and 2 in PM, then changed to 1 in Am and 1 in PM last visit- a month ago she stopped the morning pill. She has still not noted significant worsening in low back pain. Still has some pain but not sure it is worse.  Also on cymbalta 60mg  for depression but may help with pain modulation.  More extensive note on 06/30/2017 A/P: reasonably controlled. She would like to trial 25mg   lyrica every other night and journal symptoms- if no worsening of pain on nights not on it then she can discontinue after a month or two   Depression S: Depression remains very well controlled on Cymbalta 60 mg.  PHQ 9 of 0 today.  No SI. A/P: continue current cymbalta dose- possibly helping with back pain.   Senile purpura (H[facility]) S: has noticed easy bruising for several years. Bruises from where glasses lay on face, easy bruising on hands or arms if bumps them.  A/P:discussed thinning subcutaneous fat. Very mild thrombocytopenia may contribute- update CBC  Pulmonary hypertension, unspecified ([place]) Mild pulmonary hypertension followed by Dr. Gwenlyn Found. Plans for repeat echocardiogram within next month or two. No worsening symptoms/stable.    Future Appointments  Date Time Provider [place]  07/08/2018 10:30 AM [id number]  07/22/2018  2:00 PM [name], MD [facility]  08/05/2018  9:45 AM Marin Olp, MD [facility]   Advised her to move appointment in November to 6 months from today  Lab/Order associations: SPINAL STENOSIS, LUMBAR  Major depressive disorder with single episode, in full remission ([place]) - Plan: CBC, Comprehensive metabolic panel  Senile purpura ([place])  Pulmonary hypertension, unspecified ([place])  Thrombocytopenia ([place]) - Plan: CBC, Comprehensive metabolic  panel  INSOMNIA, CHRONIC  Return precautions advised.  Garret Reddish, MD

## 2018-07-01 NOTE — Patient Instructions (Addendum)
She would like to trial 25mg  lyrica every other night and journal symptoms- if no worsening of pain on nights not on it then she can discontinue after a month or two  Please stop by lab before you go

## 2018-07-01 NOTE — Assessment & Plan Note (Signed)
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  Patient was on 1 capsules of 25mg  lyrica in AM and 2 in PM, then changed to 1 in Am and 1 in PM last visit- a month ago she stopped the morning pill. She has still not noted significant worsening in low back pain. Still has some pain but not sure it is worse.  Also on cymbalta 60mg  for depression but may help with pain modulation.  More extensive note on 06/30/2017 A/P: reasonably controlled. She would like to trial 25mg  lyrica every other night and journal symptoms- if no worsening of pain on nights not on it then she can discontinue after a month or two

## 2018-07-01 NOTE — Patient Instructions (Signed)
Tanya Leon , Thank you for taking time to come for your Medicare Wellness Visit. I appreciate your ongoing commitment to your health goals. Please review the following plan we discussed and let me know if I can assist you in the future.   These are the goals we discussed: Goals    . Be more active, build up strength, lose 5 lbs.    . Gait and mobility and working on posture.        This is a list of the screening recommended for you and due dates:  Health Maintenance  Topic Date Due  . Tetanus Vaccine  08/01/2020  . Colon Cancer Screening  08/29/2020  . Flu Shot  Completed  . DEXA scan (bone density measurement)  Completed  . Pneumonia vaccines  Completed   Preventive Care for Adults  A healthy lifestyle and preventive care can promote health and wellness. Preventive health guidelines for adults include the following key practices.  . A routine yearly physical is a good way to check with your health care provider about your health and preventive screening. It is a chance to share any concerns and updates on your health and to receive a thorough exam.  . Visit your dentist for a routine exam and preventive care every 6 months. Brush your teeth twice a day and floss once a day. Good oral hygiene prevents tooth decay and gum disease.  . The frequency of eye exams is based on your age, health, family medical history, use  of contact lenses, and other factors. Follow your health care provider's recommendations for frequency of eye exams.  . Eat a healthy diet. Foods like vegetables, fruits, whole grains, low-fat dairy products, and lean protein foods contain the nutrients you need without too many calories. Decrease your intake of foods high in solid fats, added sugars, and salt. Eat the right amount of calories for you. Get information about a proper diet from your health care provider, if necessary.  . Regular physical exercise is one of the most important things you can do for your  health. Most adults should get at least 150 minutes of moderate-intensity exercise (any activity that increases your heart rate and causes you to sweat) each week. In addition, most adults need muscle-strengthening exercises on 2 or more days a week.  Silver Sneakers may be a benefit available to you. To determine eligibility, you may visit the website: www.silversneakers.com or contact program at [phone] Mon-Fri between 8AM-8PM.   . Maintain a healthy weight. The body mass index (BMI) is a screening tool to identify possible weight problems. It provides an estimate of body fat based on height and weight. Your health care provider can find your BMI and can help you achieve or maintain a healthy weight.   For adults 20 years and older: ? A BMI below 18.5 is considered underweight. ? A BMI of 18.5 to 24.9 is normal. ? A BMI of 25 to 29.9 is considered overweight. ? A BMI of 30 and above is considered obese.   . Maintain normal blood lipids and cholesterol levels by exercising and minimizing your intake of saturated fat. Eat a balanced diet with plenty of fruit and vegetables. Blood tests for lipids and cholesterol should begin at age 36 and be repeated every 5 years. If your lipid or cholesterol levels are high, you are over 50, or you are at high risk for heart disease, you may need your cholesterol levels checked more frequently. Ongoing high lipid and  cholesterol levels should be treated with medicines if diet and exercise are not working.  . If you smoke, find out from your health care provider how to quit. If you do not use tobacco, please do not start.  . If you choose to drink alcohol, please do not consume more than 2 drinks per day. One drink is considered to be 12 ounces (355 mL) of beer, 5 ounces (148 mL) of wine, or 1.5 ounces (44 mL) of liquor.  . If you are 58-85 years old, ask your health care provider if you should take aspirin to prevent strokes.  . Use sunscreen. Apply  sunscreen liberally and repeatedly throughout the day. You should seek shade when your shadow is shorter than you. Protect yourself by wearing long sleeves, pants, a wide-brimmed hat, and sunglasses year round, whenever you are outdoors.  . Once a month, do a whole body skin exam, using a mirror to look at the skin on your back. Tell your health care provider of new moles, moles that have irregular borders, moles that are larger than a pencil eraser, or moles that have changed in shape or color.

## 2018-07-01 NOTE — Assessment & Plan Note (Signed)
S: Depression remains very well controlled on Cymbalta 60 mg.  PHQ 9 of 0 today.  No SI. A/P: continue current cymbalta dose- possibly helping with back pain.

## 2018-07-04 ENCOUNTER — Encounter: Payer: Self-pay | Admitting: Family Medicine

## 2018-07-08 ENCOUNTER — Other Ambulatory Visit: Payer: Self-pay

## 2018-07-08 ENCOUNTER — Ambulatory Visit (HOSPITAL_COMMUNITY): Payer: Medicare Other | Attending: Cardiovascular Disease

## 2018-07-08 DIAGNOSIS — I272 Pulmonary hypertension, unspecified: Secondary | ICD-10-CM | POA: Diagnosis not present

## 2018-07-08 DIAGNOSIS — I493 Ventricular premature depolarization: Secondary | ICD-10-CM | POA: Diagnosis not present

## 2018-07-22 ENCOUNTER — Ambulatory Visit (INDEPENDENT_AMBULATORY_CARE_PROVIDER_SITE_OTHER): Payer: Medicare Other | Admitting: Cardiovascular Disease

## 2018-07-22 ENCOUNTER — Encounter: Payer: Self-pay | Admitting: Cardiovascular Disease

## 2018-07-22 VITALS — BP 124/80 | HR 68 | Ht 62.0 in | Wt 138.0 lb

## 2018-07-22 DIAGNOSIS — I340 Nonrheumatic mitral (valve) insufficiency: Secondary | ICD-10-CM | POA: Diagnosis not present

## 2018-07-22 DIAGNOSIS — I272 Pulmonary hypertension, unspecified: Secondary | ICD-10-CM

## 2018-07-22 NOTE — Progress Notes (Signed)
07/22/2018 GILBERT NARAIN   1941/03/12  [id number]  Primary Physician Yong Channel Brayton Mars, MD Primary Cardiologist: [name] MD [name], [place]  HPI:  Tanya Leon is a 77 y.o.  thin-appearing married Caucasian female mother of 2, grandmother, 4 grandchildren was accompanied by her husband Tanya Leon who is a patient of mine as well.  I last saw her in the office 07/01/2017 she has no cardiac risk factors other than her mother died of a myocardial infarction at age 67. She has never had a heart attack or stroke. She denies chest pain or shortness of breath. She has noticed palpitations over the last month with some occasional dizziness. EKG performed by her PCP revealed PVCs as did her EKG today. She was drinking 2 cups of coffee a day which she has changed to decaf. Thyroid function tests were normal.  Since I saw her back event monitor failed to show PVCs although she did have them on exam.  2D echo showed severe concentric LVH with moderate MR, TR and severe left atrial enlargement with mild pulmonary hypertension.  Since I saw her a year ago she is remained asymptomatic.  She is no longer feels the PVCs, denies chest pain or shortness of breath.  2D echo performed 07/08/2018 revealed moderate concentric LVH, grade 1 diastolic dysfunction, mild to moderate MR and severe left atrial enlargement with mild pulmonary hypertension. Current Meds  Medication Sig  . AMBULATORY NON FORMULARY MEDICATION Evening Primerose Oil 1300 mg take 1 by mouth twice a day  . amoxicillin (AMOXIL) 500 MG capsule daily as needed (With dental work).   Marland Kitchen aspirin-acetaminophen-caffeine (EXCEDRIN EXTRA STRENGTH) 250-250-65 MG per tablet Take 2 tablets by mouth every 6 (six) hours as needed for headache. Reported on 11/03/2015  . Azelaic Acid (FINACEA) 15 % cream Apply 1 application topically 2 (two) times daily. After skin is thoroughly washed and patted dry, gently but thoroughly massage a thin film of azelaic acid  cream into the affected area twice daily, in the morning and evening.  Marland Kitchen azithromycin (AZASITE) 1 % ophthalmic solution Place 1 drop into both eyes daily. Two weeks each month  . carboxymethylcellulose (REFRESH PLUS) 0.5 % SOLN Place 1 drop into both eyes daily as needed (Dry eyes.).   Marland Kitchen Cholecalciferol (VITAMIN D) 1000 UNITS capsule Take 2,000 Units by mouth every evening.   . Ciclopirox 1 % shampoo Apply 1 each topically every Friday.   . cycloSPORINE (RESTASIS) 0.05 % ophthalmic emulsion Place 1 drop into both eyes 2 (two) times daily.   . dorzolamide (TRUSOPT) 2 % ophthalmic solution Place 1 drop into the left eye 2 (two) times daily.  . DULoxetine (CYMBALTA) 60 MG capsule Take 1 capsule (60 mg total) by mouth daily.  Marland Kitchen estradiol (VIVELLE-DOT) 0.1 MG/24HR patch Place 1 patch onto the skin 2 (two) times a week. Reported on 11/03/2015 on thursdays and Sundays  . fluticasone (FLONASE) 50 MCG/ACT nasal spray SHAKE LIQUID AND USE 2 SPRAYS IN EACH NOSTRIL DAILY (Patient taking differently: 1 spray. SHAKE LIQUID AND USE 2 SPRAYS IN EACH NOSTRIL DAILY)  . Lutein 20 MG TABS Take 1 tablet by mouth 2 (two) times daily.   . Multiple Vitamins-Minerals (ICAPS AREDS 2) CAPS Take 1 capsule by mouth 2 (two) times daily.   . Multiple Vitamins-Minerals (MULTIVITAMIN PO) Take 1 tablet by mouth every morning.   . neomycin-polymyxin b-dexamethasone (MAXITROL) 3.5-10000-0.1 OINT Place 1 application into both eyes as needed. No more than  twice a week  . NONFORMULARY OR COMPOUNDED ITEM Apply 1-2 g topically daily. [employer] Nail lacquer: Fluconazole 2%, Terbinafine 1%, DMSO  . olopatadine (PATANOL) 0.1 % ophthalmic solution Place 1 drop into both eyes 2 (two) times daily.  Marland Kitchen omega-3 acid ethyl esters (LOVAZA) 1 G capsule Take 1 g by mouth 2 (two) times daily.   . polyethylene glycol powder (GLYCOLAX/MIRALAX) powder Take 17 g by mouth every morning.   . pregabalin (LYRICA) 25 MG capsule TAKE 1 CAPSULE IN THE MORNING AND  TAKE 2 TABLETS IN THE EVENING (Patient taking differently: Take 25 mg by mouth daily. )  . tiZANidine (ZANAFLEX) 2 MG tablet TAKE 1 TABLET BY MOUTH AT BEDTIME.  Marland Kitchen zolpidem (AMBIEN) 5 MG tablet Take 1 tablet (5 mg total) by mouth at bedtime as needed. for sleep     Allergies  Allergen Reactions  . Shellfish Allergy Swelling    Social History   Socioeconomic History  . Marital status: Married    Spouse name: Not on file  . Number of children: 2  . Years of education: Not on file  . Highest education level: Bachelor's degree (e.g., BA, AB, BS)  Occupational History  . 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 Financial resource strain: Not on file  . Food insecurity:    Worry: Not on file    Inability: Not on file  . Transportation needs:    Medical: Not on file    Non-medical: Not on file  Tobacco Use  . Smoking status: Never Smoker  . Smokeless tobacco: Never Used  Substance and Sexual Activity  . Alcohol use: Yes    Alcohol/week: 7.0 standard drinks    Types: 7 Glasses of wine per week    Comment: 1 glass wine with dinner most evenings  . Drug use: No  . Sexual activity: Yes    Birth control/protection: Post-menopausal  Lifestyle  . Physical activity:    Days per week: Not on file    Minutes per session: Not on file  . Stress: Not on file  Relationships  . Social connections:    Talks on phone: Not on file    Gets together: Not on file    Attends religious service: Not on file    Active member of club or organization: Not on file    Attends meetings of clubs or organizations: Not on file    Relationship status: Not on file  . Intimate partner violence:    Fear of current or ex partner: Not on file    Emotionally abused: Not on file    Physically abused: Not on file    Forced sexual activity: Not on file  Other Topics Concern  . Not on file  Social History Narrative   Married 88 years in 2015. 2 kids. 4 grandkids.       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 Previously [employer].      Review of Systems: General: negative for chills, fever, night sweats or weight changes.  Cardiovascular: negative for chest pain, dyspnea on exertion, edema, orthopnea, palpitations, paroxysmal nocturnal dyspnea or shortness of breath Dermatological: negative for rash Respiratory: negative for cough or wheezing Urologic: negative for hematuria Abdominal: negative for nausea, vomiting, diarrhea, bright red blood per rectum, melena, or hematemesis Neurologic: negative for visual changes, syncope, or dizziness All other systems reviewed and are otherwise negative except as noted above.  Blood pressure 124/80, pulse 68, height 5\' 2"  (1.575 m), weight 138 lb (62.6 kg).  General appearance: alert and no distress Neck: no adenopathy, no carotid bruit, no JVD, supple, symmetrical, trachea midline and thyroid not enlarged, symmetric, no tenderness/mass/nodules Lungs: clear to auscultation bilaterally Heart: regular rate and rhythm, S1, S2 normal, no murmur, click, rub or gallop Extremities: extremities normal, atraumatic, no cyanosis or edema Pulses: 2+ and symmetric Skin: Skin color, texture, turgor normal. No rashes or lesions Neurologic: Alert and oriented X 3, normal strength and tone. Normal symmetric reflexes. Normal coordination and gait  EKG sinus rhythm at 68 with left axis deviation and nonspecific ST and T wave changes.  Personally reviewed this EKG.  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  Moderate mitral regurgitation Ms. Balzarini had a 2D echocardiogram performed 07/08/2018 revealing mild to moderate mitral regurgitation.      [name] MD FACP,FACC,FAHA, [facility] 07/22/2018 2:31 PM

## 2018-07-22 NOTE — Assessment & Plan Note (Signed)
Tanya Leon had a 2D echocardiogram performed 07/08/2018 revealing mild to moderate mitral regurgitation.

## 2018-07-22 NOTE — Patient Instructions (Signed)
Medication Instructions:  Your physician recommends that you continue on your current medications as directed. Please refer to the Current Medication list given to you today.  If you need a refill on your cardiac medications before your next appointment, please call your pharmacy.   Lab work: none If you have labs (blood work) drawn today and your tests are completely normal, you will receive your results only by: Marland Kitchen MyChart Message (if you have MyChart) OR . A paper copy in the mail If you have any lab test that is abnormal or we need to change your treatment, we will call you to review the results.  Testing/Procedures: Your physician has requested that you have an echocardiogram. Echocardiography is a painless test that uses sound waves to create images of your heart. It provides your doctor with information about the size and shape of your heart and how well your heart's chambers and valves are working. This procedure takes approximately one hour. There are no restrictions for this procedure.  SCHEDULE IN 07/2019  Follow-Up: At [facility], you and your health needs are our priority.  As part of our continuing mission to provide you with exceptional heart care, we have created designated Provider Care Teams.  These Care Teams include your primary Cardiologist (physician) and Advanced Practice Providers (APPs -  Physician Assistants and Nurse Practitioners) who all work together to provide you with the care you need, when you need it. You will need a follow up appointment in 12 months.  Please call our office 2 months in advance to schedule this appointment.  You may see  Dr. Gwenlyn Found or one of the following Advanced Practice Providers on your designated Care Team:   Kerin Ransom, PA-C Roby Lofts, [place] . Sande Rives, PA-C  Any Other Special Instructions Will Be Listed Below (If Applicable).

## 2018-07-22 NOTE — Assessment & Plan Note (Signed)
Mild pulmonary hypertension with a PA pressure 38 mmHg.

## 2018-07-22 NOTE — Assessment & Plan Note (Signed)
History of asymptomatic PVCs

## 2018-07-28 ENCOUNTER — Encounter: Payer: Self-pay | Admitting: Family Medicine

## 2018-07-29 ENCOUNTER — Other Ambulatory Visit: Payer: Self-pay | Admitting: Family Medicine

## 2018-07-29 MED ORDER — PREGABALIN 25 MG PO CAPS: ORAL_CAPSULE | ORAL | 1 refills | Status: DC

## 2018-07-29 NOTE — Telephone Encounter (Signed)
I sent this in for her

## 2018-08-05 ENCOUNTER — Ambulatory Visit: Payer: Medicare Other | Admitting: Family Medicine

## 2018-08-07 ENCOUNTER — Telehealth: Payer: Self-pay | Admitting: *Deleted

## 2018-08-07 NOTE — Telephone Encounter (Signed)
Copied from [place] [id number]. Topic: Appointment Scheduling - Scheduling Inquiry for Clinic >> Aug 07, 2018 10:41 AM Reyne Dumas L wrote: Reason for CRM:   Pt calling.  States she is moving to [place] on May 1st.  Pt wants to make an appointment for labs and a TB test that need to be done prior to her moving in.  Also states that physician will need to fill out forms for her.  Tried making pt an appointment with Dr. Yong Channel, but states that she wants to do labs first. Pt can be reached at [phone]

## 2018-08-10 NOTE — Telephone Encounter (Signed)
Called and spoke with patient. I scheduled her and her husband for appointments to get FL[employer] forms completed. I scheduled nurse visit for TB screenings 2 days prior to their exams. They will bring forms to the office

## 2018-08-17 ENCOUNTER — Other Ambulatory Visit: Payer: Self-pay

## 2018-08-19 ENCOUNTER — Telehealth: Payer: Self-pay | Admitting: Podiatry

## 2018-08-19 NOTE — Telephone Encounter (Signed)
Pt called requesting a refill on compound medicine. Pt last seen in Dec. 2018.

## 2018-08-20 MED ORDER — NONFORMULARY OR COMPOUNDED ITEM: 11 refills | Status: DC

## 2018-08-20 NOTE — Addendum Note (Signed)
Addended by: Harriett Sine D on: 08/20/2018 10:36 AM   Modules accepted: Orders

## 2018-08-20 NOTE — Telephone Encounter (Signed)
I informed pt our office was using [place] [phone] and they would contact her with delivery and coverage information. Faxed orders to [facility].

## 2018-10-15 ENCOUNTER — Other Ambulatory Visit: Payer: Self-pay | Admitting: Family Medicine

## 2018-10-27 DIAGNOSIS — R2681 Unsteadiness on feet: Secondary | ICD-10-CM | POA: Diagnosis not present

## 2018-10-27 DIAGNOSIS — H903 Sensorineural hearing loss, bilateral: Secondary | ICD-10-CM | POA: Diagnosis not present

## 2018-10-27 DIAGNOSIS — H608X3 Other otitis externa, bilateral: Secondary | ICD-10-CM | POA: Diagnosis not present

## 2018-10-27 DIAGNOSIS — H6123 Impacted cerumen, bilateral: Secondary | ICD-10-CM | POA: Diagnosis not present

## 2018-11-04 ENCOUNTER — Telehealth: Payer: Self-pay | Admitting: *Deleted

## 2018-11-04 DIAGNOSIS — D120 Benign neoplasm of cecum: Principal | ICD-10-CM

## 2018-11-04 DIAGNOSIS — K635 Polyp of colon: Secondary | ICD-10-CM

## 2018-11-04 NOTE — Addendum Note (Signed)
Addended by: Larina Bras on: 11/04/2018 02:02 PM   Modules accepted: Orders

## 2018-11-04 NOTE — Telephone Encounter (Signed)
Dr Hilarie Fredrickson has reviewed Tanya Leon's colonoscopy from 08/29/17 and indicates that it is time for her to have repeat procedure (chromoendoscopy of cecum) due to history of cecal polyp and family history colon polyps. Tanya Leon will need a two day prep and will need to have procedure completed at hospital due to her need for chromoendoscopy with methylene blue.  I have spoken to Tanya Leon and she is agreeable to procedure. She has been scheduled for procedure at [facility] on 11/13/2018 at 830 am with 7 am arrival, booking # [phone]. I have also spoken with [place]iann Mccoy, RN to make sure methylene blue will be on hand for procedure.  Tanya Leon has been scheduled for previsit on 11/05/2018 at 10 am.

## 2018-11-05 ENCOUNTER — Ambulatory Visit (AMBULATORY_SURGERY_CENTER): Payer: Self-pay

## 2018-11-05 VITALS — Ht 62.0 in | Wt 136.2 lb

## 2018-11-05 DIAGNOSIS — Z860100 Personal history of colon polyps, unspecified: Secondary | ICD-10-CM

## 2018-11-05 MED ORDER — NA SULFATE-K SULFATE-MG SULF 17.5-3.13-1.6 GM/177ML PO SOLN: 1.0000 | Freq: Once | ORAL | 0 refills | Status: AC

## 2018-11-05 NOTE — Progress Notes (Signed)
Denies allergies to eggs or soy products. Denies complication of anesthesia or sedation. Denies use of weight loss medication. Denies use of O2.   Emmi instructions declined.   Patient was given a 2 day prep. A Suprep sample was given to the patient. Lot # [id number] / Exp. Date 1/22.

## 2018-11-12 ENCOUNTER — Other Ambulatory Visit: Payer: Self-pay

## 2018-11-12 ENCOUNTER — Encounter (HOSPITAL_COMMUNITY): Payer: Self-pay | Admitting: *Deleted

## 2018-11-12 NOTE — Progress Notes (Signed)
Spoke with patient for pre procedure interview via telephone. Patient to use eye drops and take Lyrica AM of procedure. Driver will be husband Tanya Leon. Arrival time 0700.

## 2018-11-13 ENCOUNTER — Ambulatory Visit (HOSPITAL_COMMUNITY)
Admission: RE | Admit: 2018-11-13 | Discharge: 2018-11-13 | Disposition: A | Discharge status: Home / Self Care | Payer: Medicare Other | Attending: Internal Medicine | Admitting: Internal Medicine

## 2018-11-13 ENCOUNTER — Ambulatory Visit (HOSPITAL_COMMUNITY): Payer: Medicare Other | Admitting: Anesthesiology

## 2018-11-13 ENCOUNTER — Encounter (HOSPITAL_COMMUNITY)
Admission: RE | Disposition: A | Discharge status: Home / Self Care | Payer: Self-pay | Source: Home / Self Care | Attending: Internal Medicine

## 2018-11-13 ENCOUNTER — Encounter (HOSPITAL_COMMUNITY): Payer: Self-pay | Admitting: *Deleted

## 2018-11-13 DIAGNOSIS — Z8371 Family history of colonic polyps: Secondary | ICD-10-CM | POA: Diagnosis not present

## 2018-11-13 DIAGNOSIS — Z79899 Other long term (current) drug therapy: Secondary | ICD-10-CM | POA: Diagnosis not present

## 2018-11-13 DIAGNOSIS — Z96653 Presence of artificial knee joint, bilateral: Secondary | ICD-10-CM | POA: Diagnosis not present

## 2018-11-13 DIAGNOSIS — Z09 Encounter for follow-up examination after completed treatment for conditions other than malignant neoplasm: Secondary | ICD-10-CM | POA: Insufficient documentation

## 2018-11-13 DIAGNOSIS — K5900 Constipation, unspecified: Secondary | ICD-10-CM | POA: Diagnosis not present

## 2018-11-13 DIAGNOSIS — K648 Other hemorrhoids: Secondary | ICD-10-CM | POA: Insufficient documentation

## 2018-11-13 DIAGNOSIS — Z8249 Family history of ischemic heart disease and other diseases of the circulatory system: Secondary | ICD-10-CM | POA: Insufficient documentation

## 2018-11-13 DIAGNOSIS — Z86010 Personal history of colonic polyps: Secondary | ICD-10-CM | POA: Insufficient documentation

## 2018-11-13 DIAGNOSIS — D125 Benign neoplasm of sigmoid colon: Secondary | ICD-10-CM | POA: Insufficient documentation

## 2018-11-13 DIAGNOSIS — Z96643 Presence of artificial hip joint, bilateral: Secondary | ICD-10-CM | POA: Diagnosis not present

## 2018-11-13 DIAGNOSIS — Z7982 Long term (current) use of aspirin: Secondary | ICD-10-CM | POA: Diagnosis not present

## 2018-11-13 DIAGNOSIS — K5730 Diverticulosis of large intestine without perforation or abscess without bleeding: Secondary | ICD-10-CM | POA: Diagnosis not present

## 2018-11-13 DIAGNOSIS — Z888 Allergy status to other drugs, medicaments and biological substances status: Secondary | ICD-10-CM | POA: Diagnosis not present

## 2018-11-13 DIAGNOSIS — Z980 Intestinal bypass and anastomosis status: Secondary | ICD-10-CM | POA: Insufficient documentation

## 2018-11-13 DIAGNOSIS — I272 Pulmonary hypertension, unspecified: Secondary | ICD-10-CM | POA: Diagnosis not present

## 2018-11-13 DIAGNOSIS — Z9889 Other specified postprocedural states: Secondary | ICD-10-CM | POA: Insufficient documentation

## 2018-11-13 DIAGNOSIS — D120 Benign neoplasm of cecum: Secondary | ICD-10-CM | POA: Diagnosis not present

## 2018-11-13 HISTORY — PX: COLONOSCOPY WITH PROPOFOL: SHX5780

## 2018-11-13 HISTORY — PX: POLYPECTOMY: SHX5525

## 2018-11-13 HISTORY — PX: BIOPSY: SHX5522

## 2018-11-13 SURGERY — COLONOSCOPY WITH PROPOFOL
Anesthesia: Monitor Anesthesia Care

## 2018-11-13 MED ORDER — PROPOFOL 10 MG/ML IV BOLUS
INTRAVENOUS | Status: DC | PRN
  Administered 2018-11-13 (×2): 20 mg via INTRAVENOUS

## 2018-11-13 MED ORDER — EPHEDRINE SULFATE-NACL 50-0.9 MG/10ML-% IV SOSY
PREFILLED_SYRINGE | INTRAVENOUS | Status: DC | PRN
  Administered 2018-11-13 (×2): 10 mg via INTRAVENOUS

## 2018-11-13 MED ORDER — PROPOFOL 500 MG/50ML IV EMUL
INTRAVENOUS | Status: DC | PRN
  Administered 2018-11-13: 125 ug/kg/min via INTRAVENOUS

## 2018-11-13 MED ORDER — LIDOCAINE 2% (20 MG/ML) 5 ML SYRINGE
INTRAMUSCULAR | Status: DC | PRN
  Administered 2018-11-13: 100 mg via INTRAVENOUS

## 2018-11-13 MED ORDER — LACTATED RINGERS IV SOLN
INTRAVENOUS | Status: DC
  Administered 2018-11-13: 1000 mL via INTRAVENOUS

## 2018-11-13 MED ORDER — METHYLENE BLUE 0.5 % INJ SOLN
INTRAVENOUS | Status: DC | PRN
  Administered 2018-11-13: 10 mL

## 2018-11-13 MED ORDER — PROPOFOL 10 MG/ML IV BOLUS
INTRAVENOUS | Status: AC
  Filled 2018-11-13: qty 40

## 2018-11-13 MED ORDER — METHYLENE BLUE 0.5 % INJ SOLN
INTRAVENOUS | Status: AC
  Filled 2018-11-13: qty 10

## 2018-11-13 SURGICAL SUPPLY — 22 items

## 2018-11-13 NOTE — Discharge Instructions (Signed)
YOU HAD AN ENDOSCOPIC PROCEDURE TODAY: Refer to the procedure report and other information in the discharge instructions given to you for any specific questions about what was found during the examination. If this information does not answer your questions, please call [place] office at [phone] to clarify.  ° °YOU SHOULD EXPECT: Some feelings of bloating in the abdomen. Passage of more gas than usual. Walking can help get rid of the air that was put into your GI tract during the procedure and reduce the bloating. If you had a lower endoscopy (such as a colonoscopy or flexible sigmoidoscopy) you may notice spotting of blood in your stool or on the toilet paper. Some abdominal soreness may be present for a day or two, also. ° °DIET: Your first meal following the procedure should be a light meal and then it is ok to progress to your normal diet. A half-sandwich or bowl of soup is an example of a good first meal. Heavy or fried foods are harder to digest and may make you feel nauseous or bloated. Drink plenty of fluids but you should avoid alcoholic beverages for 24 hours. If you had a esophageal dilation, please see attached instructions for diet.   ° °ACTIVITY: Your care partner should take you home directly after the procedure. You should plan to take it easy, moving slowly for the rest of the day. You can resume normal activity the day after the procedure however YOU SHOULD NOT DRIVE, use power tools, machinery or perform tasks that involve climbing or major physical exertion for 24 hours (because of the sedation medicines used during the test).  ° °SYMPTOMS TO REPORT IMMEDIATELY: °A gastroenterologist can be reached at any hour. Please call [phone]  for any of the following symptoms:  °Following lower endoscopy (colonoscopy, flexible sigmoidoscopy) °Excessive amounts of blood in the stool  °Significant tenderness, worsening of abdominal pains  °Swelling of the abdomen that is new, acute  °Fever of 100° or  higher  °Following upper endoscopy (EGD, EUS, ERCP, esophageal dilation) °Vomiting of blood or coffee ground material  °New, significant abdominal pain  °New, significant chest pain or pain under the shoulder blades  °Painful or persistently difficult swallowing  °New shortness of breath  °Black, tarry-looking or red, bloody stools ° °FOLLOW UP:  °If any biopsies were taken you will be contacted by phone or by letter within the next 1-3 weeks. Call [phone]  if you have not heard about the biopsies in 3 weeks.  °Please also call with any specific questions about appointments or follow up tests. ° °

## 2018-11-13 NOTE — Anesthesia Preprocedure Evaluation (Addendum)
Anesthesia Evaluation  Patient identified by MRN, date of birth, ID band Patient awake    Reviewed: Allergy & Precautions, NPO status , Patient's Chart, lab work & pertinent test results  Airway Mallampati: I  TM Distance: >3 FB Neck ROM: Full    Dental no notable dental hx. (+) Teeth Intact, Dental Advisory Given   Pulmonary neg pulmonary ROS,    Pulmonary exam normal breath sounds clear to auscultation       Cardiovascular Normal cardiovascular exam+ Valvular Problems/Murmurs MR  Rhythm:Regular Rate:Normal  TTE 06/2018 EF 60-65%, grade 1 DD, mild to mod MR   Neuro/Psych  Headaches, PSYCHIATRIC DISORDERS Depression    GI/Hepatic negative GI ROS, Neg liver ROS,   Endo/Other  negative endocrine ROS  Renal/GU negative Renal ROS  negative genitourinary   Musculoskeletal  (+) Arthritis , Osteoarthritis,    Abdominal   Peds  Hematology negative hematology ROS (+)   Anesthesia Other Findings H/o colon polyps, cecal polyp  Reproductive/Obstetrics                            Anesthesia Physical Anesthesia Plan  ASA: II  Anesthesia Plan: MAC   Post-op Pain Management:    Induction: Intravenous  PONV Risk Score and Plan: 2 and Treatment may vary due to age or medical condition and Propofol infusion  Airway Management Planned: Natural Airway  Additional Equipment:   Intra-op Plan:   Post-operative Plan:   Informed Consent: I have reviewed the patients History and Physical, chart, labs and discussed the procedure including the risks, benefits and alternatives for the proposed anesthesia with the patient or authorized representative who has indicated his/her understanding and acceptance.     Dental advisory given  Plan Discussed with: CRNA  Anesthesia Plan Comments:         Anesthesia Quick Evaluation

## 2018-11-13 NOTE — Op Note (Signed)
[name] Procedure Date: 11/13/2018 MRN: [id number] Attending MD: Jerene Bears , MD Date of Birth: 05-25-1941 CSN: [id number] Age: 78 Admit Type: Outpatient Procedure:                Colonoscopy with chromoscopy Indications:              Surveillance: Piecemeal removal of large sessile                            adenoma last colonoscopy (< 3 yrs), Last                            colonoscopy: November 2018 Providers:                Lajuan Lines. Hilarie Fredrickson, MD, Cleda Daub, RN, Tinnie Gens,                            Technician, [facility], CRNA Referring MD:             Brayton Mars. Hunter MD Medicines:                Monitored Anesthesia Care Complications:            No immediate complications. Estimated Blood Loss:     Estimated blood loss was minimal. Procedure:                Pre-Anesthesia Assessment:                           - Prior to the procedure, a History and Physical                            was performed, and patient medications and                            allergies were reviewed. The patient's tolerance of                            previous anesthesia was also reviewed. The risks                            and benefits of the procedure and the sedation                            options and risks were discussed with the patient.                            All questions were answered, and informed consent                            was obtained. Prior Anticoagulants: The patient has                            taken no previous anticoagulant or antiplatelet  agents. ASA Grade Assessment: II - A patient with                            mild systemic disease. After reviewing the risks                            and benefits, the patient was deemed in                            satisfactory condition to undergo the procedure.                           After obtaining informed consent, the colonoscope                             was passed under direct vision. Throughout the                            procedure, the patient's blood pressure, pulse, and                            oxygen saturations were monitored continuously. The                            PCF-H190DL ([id number]) Olympus pediatric colonoscope                            was introduced through the anus and advanced to the                            terminal ileum. The colonoscopy was performed                            without difficulty. The patient tolerated the                            procedure well. The quality of the bowel                            preparation was good. The terminal ileum, ileocecal                            valve, appendiceal orifice, and rectum were                            photographed. Scope In: 8:51:46 AM Scope Out: 9:23:59 AM Scope Withdrawal Time: 0 hours 24 minutes 51 seconds  Total Procedure Duration: 0 hours 32 minutes 13 seconds  Findings:      The digital rectal exam was normal.      The terminal ileum appeared normal.      Staining chromoscopy with methylene blue was performed in the proximal       ascending colon, in the cecum and at the ileocecal valve.      Post polypectomy scaring was found in the  cecum.      Two sessile polyps were found in the cecum. The polyps were 2 to 3 mm in       size. Difficult to determine if these areas were residual from previous       polypectomy. These polyps were removed with a cold snare. Resection and       retrieval were complete.      Multiple biopsies were obtained with cold forceps for histology in a       targeted manner at the ileocecal valve to exclude adenoma. The mucosa on       the distal side of the ICV did not take up staining as readily as       surrounding tissue though did not appear overtly adenomatous, thus       biopsies to exclude adenoma.      A 4 mm polyp was found in the sigmoid colon. The polyp was sessile. The        polyp was removed with a cold snare. Resection was complete, but the       polyp tissue was not retrieved.      Multiple small-mouthed diverticula were found in the sigmoid colon,       descending colon, hepatic flexure and ascending colon.      There was evidence of a prior end-to-side colo-colonic anastomosis in       the sigmoid colon. This was patent and was characterized by healthy       appearing mucosa.      Internal hemorrhoids were found during retroflexion. The hemorrhoids       were small. Impression:               - The examined portion of the ileum was normal.                           - Chromoscopy was performed in the proximal                            ascending colon, in the cecum and at the ileocecal                            valve.                           - Post-polypectomy scaring in the cecum.                           - Two 2 to 3 mm polyps in the cecum, removed with a                            cold snare. Resected and retrieved.                           - Multiple biopsies were obtained at the ileocecal                            valve.                           - One 4 mm polyp in  the sigmoid colon, removed with                            a cold snare. Complete resection. Polyp tissue not                            retrieved.                           - Diverticulosis in the sigmoid colon, in the                            descending colon, at the hepatic flexure and in the                            ascending colon.                           - Patent end-to-side colo-colonic anastomosis,                            characterized by healthy appearing mucosa.                           - Small internal hemorrhoids. Moderate Sedation:      N/A Recommendation:           - Patient has a contact number available for                            emergencies. The signs and symptoms of potential                            delayed complications were discussed with the                             patient. Return to normal activities tomorrow.                            Written discharge instructions were provided to the                            patient.                           - Resume previous diet.                           - Continue present medications.                           - Await pathology results.                           - Repeat colonoscopy is recommended for                            surveillance.  The colonoscopy date will be                            determined after pathology results from today's                            exam become available for review. Procedure Code(s):        --- Professional ---                           [phone], Colonoscopy, flexible; with removal of                            tumor(s), polyp(s), or other lesion(s) by snare                            technique                           [id number], 73, Colonoscopy, flexible; with biopsy,                            single or multiple                           [id number], Unlisted procedure, colon Diagnosis Code(s):        --- Professional ---                           Z86.010, Personal history of colonic polyps                           K64.8, Other hemorrhoids                           Z98.890, Other specified postprocedural states                           D12.0, Benign neoplasm of cecum                           D12.5, Benign neoplasm of sigmoid colon                           Z98.0, Intestinal bypass and anastomosis status                           K57.30, Diverticulosis of large intestine without                            perforation or abscess without bleeding CPT copyright 2018 [employer]. All rights reserved. The codes documented in this report are preliminary and upon coder review may  be revised to meet current compliance requirements. Jerene Bears, MD 11/13/2018 9:45:39 AM This report has been signed electronically. Number of Addenda:  0

## 2018-11-13 NOTE — Anesthesia Postprocedure Evaluation (Signed)
Anesthesia Post Note  Patient: Tanya Leon  Procedure(s) Performed: COLONOSCOPY WITH PROPOFOL with Methylane Blue Injection (N/A ) BIOPSY     Patient location during evaluation: Endoscopy Anesthesia Type: MAC Level of consciousness: awake and alert Pain management: pain level controlled Vital Signs Assessment: post-procedure vital signs reviewed and stable Respiratory status: spontaneous breathing, nonlabored ventilation, respiratory function stable and patient connected to nasal cannula oxygen Cardiovascular status: blood pressure returned to baseline and stable Postop Assessment: no apparent nausea or vomiting Anesthetic complications: no    Last Vitals:  Vitals:   11/13/18 0940 11/13/18 0950  BP: 124/89 (!) 149/80  Pulse: 79 67  Resp: 12 16  Temp: (!) 35.7 C   SpO2: 98% 96%    Last Pain:  Vitals:   11/13/18 0950  TempSrc:   PainSc: 0-No pain                 Savon Cobbs L Laria Grimmett

## 2018-11-13 NOTE — H&P (Signed)
HPI: Tanya Leon is a 78 year old female with a past medical history of adenomatous colon polyps, colonic diverticulosis, constipation, pulmonary hypertension who returns for surveillance colonoscopy.  In November 2018 she had a flat adenoma removed from her right colon using chromoendoscopy.  She returns for surveillance.  No complaints today.  No change in bowel habits, abdominal pain, rectal bleeding or melena.  Past Medical History:  Diagnosis Date  . Allergy 1973   Seasonal  . Arthritis   . Blood transfusion without reported diagnosis 08-15-08   Following hip replacement  . Cancer (HCC)    hx of skin cancer   . Cataract 1998   Cataract surgery both eyes  . Colon polyps    adenomatous  . Complication of anesthesia    " pt stated they gave me too much - kept having to be reminded to breathe   . Constipation    opiod induced associated with back pain  . Depression    hx of   . Diverticulosis   . DIVERTICULOSIS, COLON 01/13/2009  . Glaucoma 05-12-14  . Headache    history of migraines  . Heart murmur    benign, dagnosed around age 24,prophylactic antibiotic  . Low back pain   . Lumbar spondylolysis   . Osteopenia   . Pneumonia    hx of as a baby   . Pulmonary hypertension ([place])   . PVC's (premature ventricular contractions)   . Rectocele   . Tubular adenoma of colon     Past Surgical History:  Procedure Laterality Date  . ABDOMINAL HERNIA REPAIR Right    incisional  . ABDOMINAL HYSTERECTOMY     with ovaries  . BACK SURGERY  2009   L4-L5 with rods  . BLADDER SUSPENSION N/A 04/09/2017   Procedure: TRANSVAGINAL TAPE (TVT) PROCEDURE;  Surgeon: Bobbye Charleston, MD;  Location: [facility];  Service: Gynecology;  Laterality: N/A;  . CATARACT EXTRACTION, BILATERAL    . cataract left lens replacement     2016  . COLON SURGERY  2007   Rpr abscessed ruptured diverticuli  . COLONOSCOPY    . COLONOSCOPY N/A 08/29/2017   Procedure: COLONOSCOPY;  Surgeon: Jerene Bears, MD;   Location: [facility];  Service: Gastroenterology;  Laterality: N/A;  . CYSTOCELE REPAIR N/A 04/09/2017   Procedure: ANTERIOR REPAIR (CYSTOCELE);  Surgeon: Bobbye Charleston, MD;  Location: [facility];  Service: Gynecology;  Laterality: N/A;  . CYSTOSCOPY N/A 04/09/2017   Procedure: CYSTOSCOPY;  Surgeon: Bobbye Charleston, MD;  Location: [facility];  Service: Gynecology;  Laterality: N/A;  . EYE MUSCLE SURGERY    . FRACTURE SURGERY  11-13-17   L wrist-open reduction internal fixation  . GLAUCOMA SURGERY Left 05/12/14  . HERNIA REPAIR  05-28-11   Hernia result of 2007 colon surg  . incisional ab  05/28/11  . JOINT REPLACEMENT     right hip replacement   . LUMBAR FUSION     L4-L5  . PARTIAL KNEE ARTHROPLASTY Right   . PARTIAL KNEE ARTHROPLASTY Left   . perforated deverticular abscess/laporotomy and colostomy     depression after this  . POLYPECTOMY N/A 08/29/2017   Procedure: POLYPECTOMY;  Surgeon: Jerene Bears, MD;  Location: [facility];  Service: Gastroenterology;  Laterality: N/A;  . SPINE SURGERY  02-16-08   Fusion L4L5  . stribismus eye surgery Right   . take down of colostomy    . tonsillectomly    . TONSILLECTOMY    . TOTAL HIP ARTHROPLASTY Right   .  TOTAL KNEE REVISION Left 11/24/2015   Procedure: Conversion from left lateral unicompartmental knee arthroplasty to left total knee arthroplasty;  Surgeon: Mcarthur Rossetti, MD;  Location: [facility];  Service: Orthopedics;  Laterality: Left;  . TUBAL LIGATION  1976    (Not in an outpatient encounter)   Allergies  Allergen Reactions  . Crab [Shellfish Allergy] Swelling    Soft shelled crab  . Doxycycline Rash    Family History  Problem Relation Age of Onset  . Alzheimer's disease Father   . Heart disease Father        mitral valve replaced. unknown reason  . Breast cancer Mother        41  . Colon polyps Mother   . Diabetes Mother   . Heart disease Mother 32       CHF, MI 45  . Heart attack Mother        x 2  .  Arthritis Mother   . Cancer Mother   . Hearing loss Mother   . Hypertension Mother   . Prostate cancer Brother   . Cancer Brother   . Hearing loss Brother   . Hypertension Brother   . Asthma Paternal Uncle   . Hypertension Sister   . Colon cancer Neg Hx   . Esophageal cancer Neg Hx   . Rectal cancer Neg Hx   . Stomach cancer Neg Hx     Social History   Tobacco Use  . Smoking status: Never Smoker  . Smokeless tobacco: Never Used  Substance Use Topics  . Alcohol use: Yes    Alcohol/week: 7.0 standard drinks    Types: 7 Glasses of wine per week    Comment: 1 glass wine with dinner most evenings  . Drug use: No    ROS: As per history of present illness, otherwise negative  BP (!) 158/80   Pulse (!) 56   Temp 97.7 F (36.5 C) (Oral)   Resp 18   Ht 5\' 2"  (1.575 m)   Wt 61.2 kg   SpO2 99%   BMI 24.69 kg/m  Gen: awake, alert, NAD HEENT: anicteric, op clear CV: RRR, no mrg Pulm: CTA b/l Abd: soft, NT/ND, +BS throughout Ext: no c/c/e Neuro: nonfocal  RELEVANT LABS AND IMAGING: CBC    Component Value Date/Time   WBC 5.0 07/01/2018 1011   RBC 4.84 07/01/2018 1011   HGB 15.0 07/01/2018 1011   HGB 13.3 01/22/2016 0000   HCT 44.8 07/01/2018 1011   HCT 40.9 01/22/2016 0000   PLT 158.0 07/01/2018 1011   MCV 92.5 07/01/2018 1011   MCV 89 01/22/2016 0000   MCH 31.0 04/03/2017 1350   MCHC 33.6 07/01/2018 1011   RDW 13.5 07/01/2018 1011   RDW 14.8 01/22/2016 0000   LYMPHSABS 1.3 10/02/2017 1237   LYMPHSABS 1.7 01/22/2016 0000   MONOABS 0.4 10/02/2017 1237   EOSABS 0.1 10/02/2017 1237   EOSABS 0.1 01/22/2016 0000   BASOSABS 0.0 10/02/2017 1237   BASOSABS 0.0 01/22/2016 0000    CMP     Component Value Date/Time   NA 136 07/01/2018 1011   K 4.8 07/01/2018 1011   CL 100 07/01/2018 1011   CO2 30 07/01/2018 1011   GLUCOSE 89 07/01/2018 1011   BUN 14 07/01/2018 1011   CREATININE 0.73 07/01/2018 1011   CALCIUM 9.7 07/01/2018 1011   PROT 7.1 07/01/2018 1011    PROT 6.5 02/28/2016 1524   ALBUMIN 4.3 07/01/2018 1011   ALBUMIN 4.4 02/28/2016  1524   AST 15 07/01/2018 1011   ALT 12 07/01/2018 1011   ALKPHOS 55 07/01/2018 1011   BILITOT 1.0 07/01/2018 1011   BILITOT 0.3 02/28/2016 1524   GFRNONAA >60 11/25/2015 0424   GFRAA >60 11/25/2015 0424    ASSESSMENT/PLAN: 78 year old female with a past medical history of adenomatous colon polyps, colonic diverticulosis, constipation, pulmonary hypertension who returns for surveillance colonoscopy.  1.  History of adenomatous colon polyps --surveillance colonoscopy today after piecemeal resection of flat adenoma in November 2018.  Plan for chromoendoscopy.  The nature of the procedure, as well as the risks, benefits, and alternatives were carefully and thoroughly reviewed with the patient. Ample time for discussion and questions allowed. The patient understood, was satisfied, and agreed to proceed.

## 2018-11-13 NOTE — Transfer of Care (Signed)
Immediate Anesthesia Transfer of Care Note  Patient: Tanya Leon  Procedure(s) Performed: COLONOSCOPY WITH PROPOFOL with Methylane Blue Injection (N/A ) BIOPSY  Patient Location: PACU  Anesthesia Type:MAC  Level of Consciousness: awake, alert  and oriented  Airway & Oxygen Therapy: Patient Spontanous Breathing and Patient connected to face mask oxygen  Post-op Assessment: Report given to RN and Post -op Vital signs reviewed and stable  Post vital signs: Reviewed and stable  Last Vitals:  Vitals Value Taken Time  BP 116/66 11/13/2018  9:32 AM  Temp    Pulse 72 11/13/2018  9:33 AM  Resp 18 11/13/2018  9:33 AM  SpO2 99 % 11/13/2018  9:33 AM  Vitals shown include unvalidated device data.  Last Pain:  Vitals:   11/13/18 0932  TempSrc:   PainSc: 0-No pain         Complications: No apparent anesthesia complications

## 2018-11-16 ENCOUNTER — Encounter (HOSPITAL_COMMUNITY): Payer: Self-pay | Admitting: Internal Medicine

## 2018-11-16 ENCOUNTER — Encounter: Payer: Self-pay | Admitting: Family Medicine

## 2018-11-18 ENCOUNTER — Ambulatory Visit: Payer: Medicare Other | Attending: Otolaryngology | Admitting: Physical Therapy

## 2018-11-18 DIAGNOSIS — R278 Other lack of coordination: Secondary | ICD-10-CM

## 2018-11-18 DIAGNOSIS — M6281 Muscle weakness (generalized): Secondary | ICD-10-CM | POA: Diagnosis not present

## 2018-11-18 DIAGNOSIS — R2689 Other abnormalities of gait and mobility: Secondary | ICD-10-CM

## 2018-11-18 DIAGNOSIS — R293 Abnormal posture: Secondary | ICD-10-CM | POA: Diagnosis not present

## 2018-11-18 NOTE — Therapy (Signed)
[facility] [address] [place], [place], [address] Phone: [phone]   Fax:  [phone]  Physical Therapy Evaluation  Patient Details  Name: Tanya Leon MRN: [id number] Date of Birth: [birth year] Referring Provider (PT): Vicie Mutters   Encounter Date: 11/18/2018  PT End of Session - 11/18/18 1653    Visit Number  1    Number of Visits  5    Date for PT Re-Evaluation  12/25/18    Authorization Type  Medicare    Authorization Time Period  10th visit progres note    PT Start Time  1315    PT Stop Time  1400    PT Time Calculation (min)  45 min    Equipment Utilized During Treatment  Gait belt    Activity Tolerance  Patient tolerated treatment well;Other (comment)    Behavior During Therapy  WFL for tasks assessed/performed       Past Medical History:  Diagnosis Date  . Allergy 1973   Seasonal  . Arthritis   . Blood transfusion without reported diagnosis 08-15-08   Following hip replacement  . Cancer (HCC)    hx of skin cancer   . Cataract 1998   Cataract surgery both eyes  . Colon polyps    adenomatous  . Complication of anesthesia    " pt stated they gave me too much - kept having to be reminded to breathe   . Constipation    opiod induced associated with back pain  . Depression    hx of   . Diverticulosis   . DIVERTICULOSIS, COLON 01/13/2009  . Glaucoma 05-12-14  . Headache    history of migraines  . Heart murmur    benign, dagnosed around age 75,prophylactic antibiotic  . Low back pain   . Lumbar spondylolysis   . Osteopenia   . Pneumonia    hx of as a baby   . Pulmonary hypertension ([place])   . PVC's (premature ventricular contractions)   . Rectocele   . Tubular adenoma of colon     Past Surgical History:  Procedure Laterality Date  . ABDOMINAL HERNIA REPAIR Right    incisional  . ABDOMINAL HYSTERECTOMY     with ovaries  . BACK SURGERY  2009   L4-L5 with rods  . BIOPSY  11/13/2018    Procedure: BIOPSY;  Surgeon: Jerene Bears, MD;  Location: [facility];  Service: Gastroenterology;;  . BLADDER SUSPENSION N/A 04/09/2017   Procedure: TRANSVAGINAL TAPE (TVT) PROCEDURE;  Surgeon: Bobbye Charleston, MD;  Location: [facility];  Service: Gynecology;  Laterality: N/A;  . CATARACT EXTRACTION, BILATERAL    . cataract left lens replacement     2016  . COLON SURGERY  2007   Rpr abscessed ruptured diverticuli  . COLONOSCOPY    . COLONOSCOPY N/A 08/29/2017   Procedure: COLONOSCOPY;  Surgeon: Jerene Bears, MD;  Location: [facility];  Service: Gastroenterology;  Laterality: N/A;  . COLONOSCOPY WITH PROPOFOL N/A 11/13/2018   Procedure: COLONOSCOPY WITH PROPOFOL with Methylane Blue Injection;  Surgeon: Jerene Bears, MD;  Location: [facility];  Service: Gastroenterology;  Laterality: N/A;  . CYSTOCELE REPAIR N/A 04/09/2017   Procedure: ANTERIOR REPAIR (CYSTOCELE);  Surgeon: Bobbye Charleston, MD;  Location: [facility];  Service: Gynecology;  Laterality: N/A;  . CYSTOSCOPY N/A 04/09/2017   Procedure: CYSTOSCOPY;  Surgeon: Bobbye Charleston, MD;  Location: [facility];  Service: Gynecology;  Laterality: N/A;  . EYE MUSCLE SURGERY    .  FRACTURE SURGERY  11-13-17   L wrist-open reduction internal fixation  . GLAUCOMA SURGERY Left 05/12/14  . HERNIA REPAIR  05-28-11   Hernia result of 2007 colon surg  . incisional ab  05/28/11  . JOINT REPLACEMENT     right hip replacement   . LUMBAR FUSION     L4-L5  . PARTIAL KNEE ARTHROPLASTY Right   . PARTIAL KNEE ARTHROPLASTY Left   . perforated deverticular abscess/laporotomy and colostomy     depression after this  . POLYPECTOMY N/A 08/29/2017   Procedure: POLYPECTOMY;  Surgeon: Jerene Bears, MD;  Location: [facility];  Service: Gastroenterology;  Laterality: N/A;  . POLYPECTOMY  11/13/2018   Procedure: POLYPECTOMY;  Surgeon: Jerene Bears, MD;  Location: [facility];  Service: Gastroenterology;;  . SPINE SURGERY  02-16-08   Fusion L4L5  . stribismus  eye surgery Right   . take down of colostomy    . tonsillectomly    . TONSILLECTOMY    . TOTAL HIP ARTHROPLASTY Right   . TOTAL KNEE REVISION Left 11/24/2015   Procedure: Conversion from left lateral unicompartmental knee arthroplasty to left total knee arthroplasty;  Surgeon: Mcarthur Rossetti, MD;  Location: [facility];  Service: Orthopedics;  Laterality: Left;  . TUBAL LIGATION  1976    There were no vitals filed for this visit.   Subjective Assessment - 11/18/18 1407    Subjective  Patient is wanting to be able to stand and walk with better posture; hasn't fallen since last year, when she fell in aerobics and broke her wirst;  hx of left TKA  right partial knee arthro and right THR; has rod placement in L4-L5 for fusion; has a metal implant in the left wrist from the fall /fracture;  Her husband accompanied her and he stated she walk and stand with a flexed posture and is getting worse; she sits at computer a lot; she stated she has dx of pulmonary HTN ; but dyspnea does not restrict her daily routine very much;  can walk up to a mile with standing rest breaks;  she is not using any assisted device.     Patient is accompained by:  Family member    Pertinent History  hx of right THR, bilateral TKA, lumbar pain/fusion;     Limitations  Standing;Walking    How long can you sit comfortably?  no limit    How long can you stand comfortably?  10 min    How long can you walk comfortably?  1 mile with rest breaks     Patient Stated Goals  be able to stand up straight without so much fatigue     Currently in Pain?  No/denies         [facility] PT Assessment - 11/18/18 0001      Assessment   Medical Diagnosis  difficulty walking ; posture; balance deficit    Referring Provider (PT)  Vicie Mutters      Precautions   Precautions  Fall      Restrictions   Weight Bearing Restrictions  No      Balance Screen   Has the patient fallen in the past 6 months  No    Has the patient had a decrease in  activity level because of a fear of falling?   Yes    Is the patient reluctant to leave their home because of a fear of falling?   No      Home Environment   Living  Environment  Private residence      Mining engineer Comments  marked difficulty in maintaining erect posture but able to achieve with queing       ROM / Strength   AROM / PROM / Strength  AROM;Strength      AROM   Overall AROM   Within functional limits for tasks performed    Overall AROM Comments  end range tightness    AROM Assessment Site  Hip    Right/Left Hip  --   only able to get to neutral due to hip flexor tightness     Strength   Strength Assessment Site  Hip    Right/Left Hip  --   bilateral hip abductor weakness  MMT 4-/5      Flexibility   Soft Tissue Assessment /Muscle Length  yes      Transfers   Transfers  Sit to Stand    Sit to Stand  6: Modified independent (Device/Increase time)      Ambulation/Gait   Ambulation/Gait  Yes    Ambulation/Gait Assistance  7: Independent    Ambulation Distance (Feet)  115 Feet    Assistive device  None    Gait Pattern  Decreased step length - right;Decreased step length - left;Decreased arm swing - right;Decreased arm swing - left;Step-through pattern;Trunk flexed                Objective measurements completed on examination: See above findings.              PT Education - 11/18/18 1652    Education Details  instructed on need for hip flexor flexibility and posture to improve as well as how VOR works and how to use vestib stimulation in erect posture to improve posture awarness and get out of flexed posture     Person(s) Educated  Patient;Spouse    Methods  Explanation    Comprehension  Verbalized understanding;Returned demonstration       PT Short Term Goals - 11/18/18 1701      PT SHORT TERM GOAL #1   Title  Pt will be able to stand erect w/o support x 5 min w/o complaint of excessive fatigue    Baseline  quick  fatigue and needed wall to get to fully erect posture     Time  2    Period  Weeks    Status  New        PT Long Term Goals - 11/18/18 1702      PT LONG TERM GOAL #1   Title  Pt will be able to walk 1 mile with erect posture and normalized trunk , head , eye disassociation without loss of balance.     Baseline  can walk 1 mile with multi rest breaks with a siginficant flexed posture     Time  4    Period  Weeks      PT LONG TERM GOAL #2   Title  Pt will be independent with comprehensive HEP for long term improvement in core and LE strength     Baseline  no ex program     Time  4    Period  Weeks      PT LONG TERM GOAL #3   Title  Pt will demonstrated and effective step reflex on varied surface and manage curbs w/o assisted device for commuinty ambulation    Time  4    Period  Weeks    Status  New             Plan - 11/18/18 1655    Clinical Impression Statement  Pt has upper and lower crossed syndrome that is correctalbe; within todays session and eval we were able to achieve a normal posture and improved gait pattern; she needs to improve the hip flexor flexibilty and hip abduction strength; her core/lower abs were surprisingly strong but she is not using them well with standing; she will benefit from vestibular stim ex's when erect to get her gaze in a better horizontal alignment with gravity;  ex's that induce increased head/ eye / trunk disassociation in corrected posture will improve her gait effciency and saftey; she is motivated and said she was hopefull after the eval.     History and Personal Factors relevant to plan of care:  spinal stenosis; bil knee and right hip replacment; lumbar fusion    Clinical Presentation  Stable    Clinical Presentation due to:  poor muscle recruitment patterns     Clinical Impairments Affecting Rehab Potential  may have pain that limits function and her overall strength is a deficit     PT Frequency  1x / week    PT Duration  4 weeks     PT Treatment/Interventions  Therapeutic activities;Therapeutic exercise;Balance training;Neuromuscular re-education;Stair training;Functional mobility training;Manual techniques;Vestibular    PT Next Visit Plan  review HEP    PT Home Exercise Plan  see pt instructions    Consulted and Agree with Plan of Care  Patient;Family member/caregiver    Family Member Consulted  husband       Patient will benefit from skilled therapeutic intervention in order to improve the following deficits and impairments:  Abnormal gait, Decreased balance, Decreased endurance, Decreased mobility, Difficulty walking, Improper body mechanics, Decreased activity tolerance, Decreased strength, Postural dysfunction  Visit Diagnosis: Abnormal posture  Other lack of coordination  Other abnormalities of gait and mobility  Muscle weakness (generalized)     Problem List Patient Active Problem List   Diagnosis Date Noted  . Moderate mitral regurgitation 07/22/2018  . Senile purpura ([place]) 07/01/2018  . Poor posture 02/02/2018  . History of adenomatous polyp of colon   . Pulmonary hypertension, unspecified ([place]) 07/01/2017  . Cervical arthritis 06/30/2017  . PVC's (premature ventricular contractions) 03/26/2017  . 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 Thrombocytopenia ([place]) 05/03/2015  . Basal cell carcinoma 07/07/2014  . 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 Glaucoma 06/01/2014  . Depression 06/01/2014  . 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 COLONIC POLYPS 01/13/2009  . 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 SPINAL STENOSIS, LUMBAR 01/25/2008  . INSOMNIA, CHRONIC 07/22/2007  . ALLERGIC RHINITIS 05/21/2007  . Osteopenia 02/26/2007    Tanya Leon  PT DPT 11/18/2018, 5:06 PM  [facility] [address] [place], [place], [address] Phone: [phone]   Fax:  [phone]  Name: Tanya Leon MRN: [id number] Date of Birth: 1941-01-23

## 2018-11-18 NOTE — Patient Instructions (Signed)
Access Code: [email or web address]  URL: [email or web address]  Date: 11/18/2018  Prepared by: Eden Emms, DPT   Exercises  Standing Hip Flexor Stretch - 10 reps - 3 sets - 30 sec hold - 1x daily - 7x weekly  Hip Flexor Stretch at Edge of Bed - 10 reps - 3 sets - 30 sec hold - 1x daily - 7x weekly  [place] [employer] on Table - 10 reps - 3 sets - 30 sec hold - 1x daily - 7x weekly  Wall Angels - 10 reps - 3 sets - 1x daily - 7x weekly  Standing Gaze Stabilization with Head Rotation - 10 reps - 3 sets - 1x daily - 7x weekly  DNS Bug Heel Touches - 10 reps - 3 sets - 1x daily - 7x weekly

## 2018-11-19 DIAGNOSIS — H353131 Nonexudative age-related macular degeneration, bilateral, early dry stage: Secondary | ICD-10-CM | POA: Diagnosis not present

## 2018-11-19 DIAGNOSIS — H5000 Unspecified esotropia: Secondary | ICD-10-CM | POA: Diagnosis not present

## 2018-11-19 DIAGNOSIS — H35413 Lattice degeneration of retina, bilateral: Secondary | ICD-10-CM | POA: Diagnosis not present

## 2018-11-19 DIAGNOSIS — H401132 Primary open-angle glaucoma, bilateral, moderate stage: Secondary | ICD-10-CM | POA: Diagnosis not present

## 2018-11-20 ENCOUNTER — Telehealth: Payer: Self-pay

## 2018-11-20 NOTE — Telephone Encounter (Signed)
-----   Message from Mila Merry sent at 11/20/2018 11:28 AM EST ----- Appt 12/07/18 3:15 Dr,White she is aware ----- Message ----- From: Algernon Huxley, RN Sent: 11/19/2018   8:21 AM EST To: Mila Merry  Please let me know when this pt is scheduled. Referred for Right hemicolectomy with Dr. Dema Severin.

## 2018-12-02 ENCOUNTER — Other Ambulatory Visit: Payer: Self-pay | Admitting: Family Medicine

## 2018-12-02 ENCOUNTER — Ambulatory Visit: Payer: Medicare Other | Attending: Otolaryngology | Admitting: Physical Therapy

## 2018-12-02 ENCOUNTER — Encounter: Payer: Self-pay | Admitting: Physical Therapy

## 2018-12-02 DIAGNOSIS — M6281 Muscle weakness (generalized): Secondary | ICD-10-CM | POA: Insufficient documentation

## 2018-12-02 DIAGNOSIS — R2689 Other abnormalities of gait and mobility: Secondary | ICD-10-CM | POA: Insufficient documentation

## 2018-12-02 DIAGNOSIS — R278 Other lack of coordination: Secondary | ICD-10-CM | POA: Diagnosis not present

## 2018-12-02 DIAGNOSIS — R293 Abnormal posture: Secondary | ICD-10-CM | POA: Diagnosis not present

## 2018-12-02 NOTE — Therapy (Signed)
[facility] [address] [place], [place], [address] Phone: [phone]   Fax:  [phone]  Physical Therapy Treatment  Patient Details  Name: Tanya Leon MRN: [id number] Date of Birth: [birth year] Referring Provider (PT): Vicie Mutters   Encounter Date: 12/02/2018  PT End of Session - 12/02/18 1322    Visit Number  2    Number of Visits  5    Date for PT Re-Evaluation  12/25/18    Authorization Type  Medicare    Authorization Time Period  10th visit progres note    PT Start Time  1319    PT Stop Time  1400    PT Time Calculation (min)  41 min    Equipment Utilized During Treatment  Gait belt    Activity Tolerance  Patient tolerated treatment well    Behavior During Therapy  [facility] for tasks assessed/performed       Past Medical History:  Diagnosis Date  . Allergy 1973   Seasonal  . Arthritis   . Blood transfusion without reported diagnosis 08-15-08   Following hip replacement  . Cancer (HCC)    hx of skin cancer   . Cataract 1998   Cataract surgery both eyes  . Colon polyps    adenomatous  . Complication of anesthesia    " pt stated they gave me too much - kept having to be reminded to breathe   . Constipation    opiod induced associated with back pain  . Depression    hx of   . Diverticulosis   . DIVERTICULOSIS, COLON 01/13/2009  . Glaucoma 05-12-14  . Headache    history of migraines  . Heart murmur    benign, dagnosed around age 70,prophylactic antibiotic  . Low back pain   . Lumbar spondylolysis   . Osteopenia   . Pneumonia    hx of as a baby   . Pulmonary hypertension ([place])   . PVC's (premature ventricular contractions)   . Rectocele   . Tubular adenoma of colon     Past Surgical History:  Procedure Laterality Date  . ABDOMINAL HERNIA REPAIR Right    incisional  . ABDOMINAL HYSTERECTOMY     with ovaries  . BACK SURGERY  2009   L4-L5 with rods  . BIOPSY  11/13/2018   Procedure: BIOPSY;   Surgeon: Jerene Bears, MD;  Location: [facility];  Service: Gastroenterology;;  . BLADDER SUSPENSION N/A 04/09/2017   Procedure: TRANSVAGINAL TAPE (TVT) PROCEDURE;  Surgeon: Bobbye Charleston, MD;  Location: [facility];  Service: Gynecology;  Laterality: N/A;  . CATARACT EXTRACTION, BILATERAL    . cataract left lens replacement     2016  . COLON SURGERY  2007   Rpr abscessed ruptured diverticuli  . COLONOSCOPY    . COLONOSCOPY N/A 08/29/2017   Procedure: COLONOSCOPY;  Surgeon: Jerene Bears, MD;  Location: [facility];  Service: Gastroenterology;  Laterality: N/A;  . COLONOSCOPY WITH PROPOFOL N/A 11/13/2018   Procedure: COLONOSCOPY WITH PROPOFOL with Methylane Blue Injection;  Surgeon: Jerene Bears, MD;  Location: [facility];  Service: Gastroenterology;  Laterality: N/A;  . CYSTOCELE REPAIR N/A 04/09/2017   Procedure: ANTERIOR REPAIR (CYSTOCELE);  Surgeon: Bobbye Charleston, MD;  Location: [facility];  Service: Gynecology;  Laterality: N/A;  . CYSTOSCOPY N/A 04/09/2017   Procedure: CYSTOSCOPY;  Surgeon: Bobbye Charleston, MD;  Location: [facility];  Service: Gynecology;  Laterality: N/A;  . EYE MUSCLE SURGERY    . FRACTURE  SURGERY  11-13-17   L wrist-open reduction internal fixation  . GLAUCOMA SURGERY Left 05/12/14  . HERNIA REPAIR  05-28-11   Hernia result of 2007 colon surg  . incisional ab  05/28/11  . JOINT REPLACEMENT     right hip replacement   . LUMBAR FUSION     L4-L5  . PARTIAL KNEE ARTHROPLASTY Right   . PARTIAL KNEE ARTHROPLASTY Left   . perforated deverticular abscess/laporotomy and colostomy     depression after this  . POLYPECTOMY N/A 08/29/2017   Procedure: POLYPECTOMY;  Surgeon: Jerene Bears, MD;  Location: [facility];  Service: Gastroenterology;  Laterality: N/A;  . POLYPECTOMY  11/13/2018   Procedure: POLYPECTOMY;  Surgeon: Jerene Bears, MD;  Location: [facility];  Service: Gastroenterology;;  . SPINE SURGERY  02-16-08   Fusion L4L5  . stribismus eye surgery Right    . take down of colostomy    . tonsillectomly    . TONSILLECTOMY    . TOTAL HIP ARTHROPLASTY Right   . TOTAL KNEE REVISION Left 11/24/2015   Procedure: Conversion from left lateral unicompartmental knee arthroplasty to left total knee arthroplasty;  Surgeon: Mcarthur Rossetti, MD;  Location: [facility];  Service: Orthopedics;  Laterality: Left;  . TUBAL LIGATION  1976    There were no vitals filed for this visit.  Subjective Assessment - 12/02/18 1320    Subjective  No new complaints. No falls. Some back pain.     Patient is accompained by:  Family member    Pertinent History  hx of right THR, bilateral TKA, lumbar pain/fusion;     Limitations  Standing;Walking    How long can you sit comfortably?  no limit    How long can you stand comfortably?  10 min    How long can you walk comfortably?  1 mile with rest breaks     Patient Stated Goals  be able to stand up straight without so much fatigue     Currently in Pain?  Yes    Pain Score  3     Pain Location  Back    Pain Orientation  Lower    Pain Descriptors / Indicators  Sore    Pain Type  Chronic pain    Pain Onset  More than a month ago    Pain Frequency  Occasional    Aggravating Factors   incr activity, poor posture    Pain Relieving Factors  rest, stretching      Treatment: Today's skilled session focused on review and advancement of pt's HEP. Cues needed on correct form and technique.   Access Code: [email or web address]  URL: [email or web address]  Date: 12/02/2018  Prepared by: Willow Ora   Exercises  Standing Hip Flexor Stretch - 3 reps - 1 sets - 30 hold - 1x daily - 5x weekly  Hip Flexor Stretch at Edge of Bed - 3 reps - 1 sets - 30 hold - 1x daily - 5x weekly  Wall Angels - 10 reps - 1 sets - 1x daily - 5x weekly  Narrow Stance with Eyes Closed and Head Rotation - 10 reps - 1 sets - 1x daily - 5x weekly  Narrow Stance with Eyes Closed and Head Nods - 10 reps - 1 sets - 1x daily - 5x weekly  Supine 90/90  Alternating Heel Touches with Posterior Pelvic Tilt - 10 reps - 1 sets - 1x daily - 5x weekly  Supine Thoracic Mobilization Towel Roll Vertical with Arm Stretch -  10 reps - 1 sets - 60 hold - 1x daily - 5x weekly  Supine Shoulder Horizontal Abduction with Resistance - 10 reps - 1 sets - 1x daily - 5x weekly  Supine PNF D2 Flexion with Resistance - 10 reps - 1 sets - 1x daily - 5x weekly          PT Education - 12/02/18 1404    Education Details  reviewed and updated HEP    Person(s) Educated  Patient    Methods  Explanation;Demonstration;Verbal cues;Handout;Tactile cues    Comprehension  Verbalized understanding;Returned demonstration;Verbal cues required;Need further instruction       PT Short Term Goals - 12/03/18 0930      PT SHORT TERM GOAL #1   Title  Pt will be able to stand erect w/o support x 5 min w/o complaint of excessive fatigue. (due 12/02/18)    Baseline  quick fatigue and needed wall to get to fully erect posture     Time  2    Period  Weeks    Status  New        PT Long Term Goals - 12/03/18 0930      PT LONG TERM GOAL #1   Title  Pt will be able to walk 1 mile with erect posture and normalized trunk , head , eye disassociation without loss of balance. (all LTGs due 12/16/18)    Baseline  can walk 1 mile with multi rest breaks with a siginficant flexed posture     Time  4    Period  Weeks      PT LONG TERM GOAL #2   Title  Pt will be independent with comprehensive HEP for long term improvement in core and LE strength     Baseline  no ex program     Time  4    Period  Weeks      PT LONG TERM GOAL #3   Title  Pt will demonstrated and effective step reflex on varied surface and manage curbs w/o assisted device for commuinty ambulation    Time  4    Period  Weeks    Status  New            Plan - 12/02/18 1322    Clinical Impression Statement  Today's skilled session focused on review of HEP issued at eval and additon of new ex's to address postural  strengthening. New access code created today as pt's old code not working/pulling up program. No issues reported with ex's in session. The pt is progressing well toward goals.     Clinical Impairments Affecting Rehab Potential  may have pain that limits function and her overall strength is a deficit     PT Frequency  1x / week    PT Duration  4 weeks    PT Treatment/Interventions  Therapeutic activities;Therapeutic exercise;Balance training;Neuromuscular re-education;Stair training;Functional mobility training;Manual techniques;Vestibular    PT Next Visit Plan  check STGs (I did not this session as it was her 1st follow up visit). continue to work on postural strengthening and eyes/head/trunk disassociation to improve gait efficiency.     PT Home Exercise Plan  see pt instructions    Consulted and Agree with Plan of Care  Patient;Family member/caregiver    Family Member Consulted  husband       Patient will benefit from skilled therapeutic intervention in order to improve the following deficits and impairments:  Abnormal gait, Decreased balance, Decreased endurance, Decreased mobility, Difficulty  walking, Improper body mechanics, Decreased activity tolerance, Decreased strength, Postural dysfunction  Visit Diagnosis: Muscle weakness (generalized)  Abnormal posture     Problem List Patient Active Problem List   Diagnosis Date Noted  . Moderate mitral regurgitation 07/22/2018  . Senile purpura ([place]) 07/01/2018  . Poor posture 02/02/2018  . History of adenomatous polyp of colon   . Pulmonary hypertension, unspecified ([place]) 07/01/2017  . Cervical arthritis 06/30/2017  . PVC's (premature ventricular contractions) 03/26/2017  . 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 Thrombocytopenia ([place]) 05/03/2015  . Basal cell carcinoma 07/07/2014  . 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 Glaucoma 06/01/2014  . Depression 06/01/2014  . Status post left partial knee replacement  12/11/2009  . COLONIC POLYPS 01/13/2009  . 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 SPINAL STENOSIS, LUMBAR 01/25/2008  . INSOMNIA, CHRONIC 07/22/2007  . ALLERGIC RHINITIS 05/21/2007  . Osteopenia 02/26/2007    Willow Ora, PTA, [facility] [address], [place], [place] [address] [phone] 12/03/18, 9:31 AM   Name: Tanya Leon MRN: [id number] Date of Birth: 10-27-1940

## 2018-12-02 NOTE — Patient Instructions (Signed)
Access Code: [email or web address]  URL: [email or web address]  Date: 12/02/2018  Prepared by: Willow Ora   Exercises  Standing Hip Flexor Stretch - 3 reps - 1 sets - 30 hold - 1x daily - 5x weekly  Hip Flexor Stretch at Edge of Bed - 3 reps - 1 sets - 30 hold - 1x daily - 5x weekly  Wall Angels - 10 reps - 1 sets - 1x daily - 5x weekly  Narrow Stance with Eyes Closed and Head Rotation - 10 reps - 1 sets - 1x daily - 5x weekly  Narrow Stance with Eyes Closed and Head Nods - 10 reps - 1 sets - 1x daily - 5x weekly  Supine 90/90 Alternating Heel Touches with Posterior Pelvic Tilt - 10 reps - 1 sets - 1x daily - 5x weekly  Supine Thoracic Mobilization Towel Roll Vertical with Arm Stretch - 10 reps - 1 sets - 60 hold - 1x daily - 5x weekly  Supine Shoulder Horizontal Abduction with Resistance - 10 reps - 1 sets - 1x daily - 5x weekly  Supine PNF D2 Flexion with Resistance - 10 reps - 1 sets - 1x daily - 5x weekly

## 2018-12-07 DIAGNOSIS — D126 Benign neoplasm of colon, unspecified: Secondary | ICD-10-CM | POA: Diagnosis not present

## 2018-12-08 ENCOUNTER — Telehealth: Payer: Self-pay

## 2018-12-08 NOTE — Telephone Encounter (Signed)
   [facility] Pre-operative Risk Assessment    Request for surgical clearance:  1. What type of surgery is being performed? Colon polyp removal  2. When is this surgery scheduled? TBD  3. What type of clearance is required (medical clearance vs. Pharmacy clearance to hold med vs. Both)? Medical  4. Are there any medications that need to be held prior to surgery and how long? none  5. Practice name and name of physician performing surgery? [facility]  Dr.Christopher White  6. What is your office phone number [phone]   7.   What is your office fax number  [phone]  8.   Anesthesia type   General   Kathyrn Lass 12/08/2018, 9:42 AM  _________________________________________________________________   (provider comments below)

## 2018-12-09 ENCOUNTER — Ambulatory Visit: Payer: Medicare Other | Admitting: Physical Therapy

## 2018-12-09 DIAGNOSIS — R2689 Other abnormalities of gait and mobility: Secondary | ICD-10-CM

## 2018-12-09 DIAGNOSIS — M6281 Muscle weakness (generalized): Secondary | ICD-10-CM | POA: Diagnosis not present

## 2018-12-09 DIAGNOSIS — R278 Other lack of coordination: Secondary | ICD-10-CM

## 2018-12-09 DIAGNOSIS — R293 Abnormal posture: Secondary | ICD-10-CM

## 2018-12-09 NOTE — Telephone Encounter (Signed)
   Primary Cardiologist: Quay Burow, MD  Chart reviewed as part of pre-operative protocol coverage. Patient was contacted 12/09/2018 in reference to pre-operative risk assessment for pending surgery as outlined below.  Tanya Leon was last seen on 07/22/18 by Dr. Gwenlyn Found .  Since that day, Tanya Leon has done well with her mitral regurg and pulmonary hypertension.  No known CAD or CVA.  She is active and meets 4 METS of activity.   Therefore, based on ACC/AHA guidelines, the patient would be at acceptable risk for the planned procedure without further cardiovascular testing.   I will route this recommendation to the requesting party via Epic fax function and remove from pre-op pool.  Please call with questions.  Cecilie Kicks, NP 12/09/2018, 11:57 AM

## 2018-12-09 NOTE — Telephone Encounter (Signed)
Patient called stating someone from pre-op called her, she is returning the call.

## 2018-12-10 NOTE — Therapy (Addendum)
[facility] [address] [place], [place], [address] Phone: [phone]   Fax:  [phone]  Physical Therapy Treatment  Patient Details  Name: Tanya Leon MRN: [id number] Date of Birth: 03-Mar-1941 Referring Provider (PT): Ermalinda Barrios   Encounter Date: 12/09/2018  PT End of Session - 12/09/18 1434     Visit Number  3    Number of Visits  5    Date for PT Re-Evaluation  12/25/18    Authorization Type  Medicare    Authorization Time Period  10th visit progres note    PT Start Time  1327    PT Stop Time  1410    PT Time Calculation (min)  43 min    Equipment Utilized During Treatment  Gait belt    Activity Tolerance  Patient tolerated treatment well    Behavior During Therapy  [facility] for tasks assessed/performed        Past Medical History:  Diagnosis Date   Allergy 1973   Seasonal   Arthritis    Blood transfusion without reported diagnosis 08-15-08   Following hip replacement   Cancer (HCC)    hx of skin cancer    Cataract 1998   Cataract surgery both eyes   Colon polyps    adenomatous   Complication of anesthesia    " pt stated they gave me too much - kept having to be reminded to breathe    Constipation    opiod induced associated with back pain   Depression    hx of    Diverticulosis    DIVERTICULOSIS, COLON 01/13/2009   Glaucoma 05-12-14   Headache    history of migraines   Heart murmur    benign, dagnosed around age 3,prophylactic antibiotic   Low back pain    Lumbar spondylolysis    Osteopenia    Pneumonia    hx of as a baby    Pulmonary hypertension (HCC)    PVC's (premature ventricular contractions)    Rectocele    Tubular adenoma of colon     Past Surgical History:  Procedure Laterality Date   ABDOMINAL HERNIA REPAIR Right    incisional   ABDOMINAL HYSTERECTOMY     with ovaries   BACK SURGERY  2009   L4-L5 with rods   BIOPSY  11/13/2018   Procedure: BIOPSY;  Surgeon: Beverley Fiedler, MD;  Location: [facility];  Service: Gastroenterology;;   BLADDER SUSPENSION N/A 04/09/2017   Procedure: TRANSVAGINAL TAPE (TVT) PROCEDURE;  Surgeon: Carrington Clamp, MD;  Location: [facility];  Service: Gynecology;  Laterality: N/A;   CATARACT EXTRACTION, BILATERAL     cataract left lens replacement     2016   COLON SURGERY  2007   Rpr abscessed ruptured diverticuli   COLONOSCOPY     COLONOSCOPY N/A 08/29/2017   Procedure: COLONOSCOPY;  Surgeon: Beverley Fiedler, MD;  Location: [facility];  Service: Gastroenterology;  Laterality: N/A;   COLONOSCOPY WITH PROPOFOL N/A 11/13/2018   Procedure: COLONOSCOPY WITH PROPOFOL with Methylane Blue Injection;  Surgeon: Beverley Fiedler, MD;  Location: [facility];  Service: Gastroenterology;  Laterality: N/A;   CYSTOCELE REPAIR N/A 04/09/2017   Procedure: ANTERIOR REPAIR (CYSTOCELE);  Surgeon: Carrington Clamp, MD;  Location: [facility];  Service: Gynecology;  Laterality: N/A;   CYSTOSCOPY N/A 04/09/2017   Procedure: CYSTOSCOPY;  Surgeon: Carrington Clamp, MD;  Location: [facility];  Service: Gynecology;  Laterality: N/A;   EYE MUSCLE SURGERY  FRACTURE SURGERY  11-13-17   L wrist-open reduction internal fixation   GLAUCOMA SURGERY Left 05/12/14   HERNIA REPAIR  05-28-11   Hernia result of 2007 colon surg   incisional ab  05/28/11   JOINT REPLACEMENT     right hip replacement    LUMBAR FUSION     L4-L5   PARTIAL KNEE ARTHROPLASTY Right    PARTIAL KNEE ARTHROPLASTY Left    perforated deverticular abscess/laporotomy and colostomy     depression after this   POLYPECTOMY N/A 08/29/2017   Procedure: POLYPECTOMY;  Surgeon: Beverley Fiedler, MD;  Location: [facility];  Service: Gastroenterology;  Laterality: N/A;   POLYPECTOMY  11/13/2018   Procedure: POLYPECTOMY;  Surgeon: Beverley Fiedler, MD;  Location: [facility];  Service: Gastroenterology;;   SPINE SURGERY  02-16-08   Fusion L4L5   stribismus eye surgery Right    take down of colostomy      tonsillectomly     TONSILLECTOMY     TOTAL HIP ARTHROPLASTY Right    TOTAL KNEE REVISION Left 11/24/2015   Procedure: Conversion from left lateral unicompartmental knee arthroplasty to left total knee arthroplasty;  Surgeon: Kathryne Hitch, MD;  Location: [facility];  Service: Orthopedics;  Laterality: Left;   TUBAL LIGATION  1976    There were no vitals filed for this visit.  Subjective Assessment - 12/09/18 1329     Subjective  No falls. She has not been doing exercises because busy selling house.     Patient is accompained by:  Family member    Pertinent History  hx of right THR, bilateral TKA, lumbar pain/fusion;     Limitations  Standing;Walking    How long can you sit comfortably?  no limit    How long can you stand comfortably?  10 min    How long can you walk comfortably?  1 mile with rest breaks     Patient Stated Goals  be able to stand up straight without so much fatigue     Currently in Pain?  Yes    Pain Score  1    earlier this morning 5-6/10   Pain Location  Back    Pain Orientation  Mid;Lower    Pain Descriptors / Indicators  Sore;Heaviness    Pain Type  Chronic pain    Pain Onset  More than a month ago    Pain Frequency  Intermittent    Aggravating Factors   first thing in morning, standing like cooking 30 minutes or more    Pain Relieving Factors  medication, massage chair, heat, relaxing        PT advised pt to break exercises into one group for bed or floor and second group seated & standing exercises. This should improve compliance. Tactile, demo & verbal cues on proper technique with Standing Hip Flexor Stretch - 3 reps - 1 sets - 30 hold - 1x daily - 5x weekly  Hip Flexor Stretch at Edge of Bed - 3 reps - 1 sets - 30 hold - 1x daily - 5x weekly  Wall Angels - 10 reps - 1 sets - 1x daily - 5x weekly PT added yellow theraband to this exercise. Narrow Stance with Eyes Closed and Head motions side/side, up/down & 2 diagonals. Initial 2-3 reps eyes open  then close eyes for 10 reps.  Supine 90/90 Alternating Heel Touches with Posterior Pelvic Tilt - 10 reps - 1 sets - 1x daily - 5x weekly  Supine Thoracic Mobilization  pool noodle along spine Vertical with Arm Stretch - 10 reps - 1 sets - 60 hold - 1x daily - 5x weekly  Supine Shoulder Horizontal Abduction with Resistance - 10 reps - 1 sets - 1x daily - 5x weekly (patient has yellow & red theraband) Supine PNF D2 Flexion with Resistance - 10 reps - 1 sets - 1x daily - 5x weekly (patient has yellow & red theraband)                           PT Short Term Goals - 12/09/18 1825       PT SHORT TERM GOAL #1   Title  Pt will be able to stand erect w/o support x 5 min w/o complaint of excessive fatigue. (due 12/02/18)    Baseline  MET 12/09/2018    Time  2    Period  Weeks    Status  Achieved         PT Long Term Goals - 12/03/18 0930       PT LONG TERM GOAL #1   Title  Pt will be able to walk 1 mile with erect posture and normalized trunk , head , eye disassociation without loss of balance. (all LTGs due 12/16/18)    Baseline  can walk 1 mile with multi rest breaks with a siginficant flexed posture     Time  4    Period  Weeks      PT LONG TERM GOAL #2   Title  Pt will be independent with comprehensive HEP for long term improvement in core and LE strength     Baseline  no ex program     Time  4    Period  Weeks      PT LONG TERM GOAL #3   Title  Pt will demonstrated and effective step reflex on varied surface and manage curbs w/o assisted device for commuinty ambulation    Time  4    Period  Weeks    Status  New             Plan - 12/09/18 1823     Clinical Impression Statement  Today's skilled session focused on reviewing exercises to address upright posture.     Clinical Impairments Affecting Rehab Potential  may have pain that limits function and her overall strength is a deficit     PT Frequency  1x / week    PT Duration  4 weeks    PT  Treatment/Interventions  Therapeutic activities;Therapeutic exercise;Balance training;Neuromuscular re-education;Stair training;Functional mobility training;Manual techniques;Vestibular    PT Next Visit Plan  check STGs     PT Home Exercise Plan  Access Code [email or web address]    Consulted and Agree with Plan of Care  Patient        Patient will benefit from skilled therapeutic intervention in order to improve the following deficits and impairments:  Abnormal gait, Decreased balance, Decreased endurance, Decreased mobility, Difficulty walking, Improper body mechanics, Decreased activity tolerance, Decreased strength, Postural dysfunction  Visit Diagnosis: Abnormal posture  Muscle weakness (generalized)  Other lack of coordination  Other abnormalities of gait and mobility     Problem List Patient Active Problem List   Diagnosis Date Noted   Moderate mitral regurgitation 07/22/2018   Senile purpura (HCC) 07/01/2018   Poor posture 02/02/2018   History of adenomatous polyp of colon    Pulmonary hypertension, unspecified (HCC) 07/01/2017   Cervical arthritis 06/30/2017  PVC's (premature ventricular contractions) 03/26/2017   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/2015   Thrombocytopenia (HCC) 05/03/2015   Basal cell carcinoma 07/07/2014   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   Glaucoma 06/01/2014   Depression 06/01/2014   Status post left partial knee replacement 12/11/2009   COLONIC POLYPS 01/13/2009   Status post right hip replacement 06/24/2008   SPINAL STENOSIS, LUMBAR 01/25/2008   INSOMNIA, CHRONIC 07/22/2007   ALLERGIC RHINITIS 05/21/2007   Osteopenia 02/26/2007    Tanya Leon PT, DPT 12/10/2018, 6:26 PM  [facility] [address] [place], [place], [address] Phone: [phone]   Fax:  [phone]  Name: Tanya Leon MRN: [id number] Date of Birth: [birth year]    PHYSICAL THERAPY DISCHARGE  SUMMARY  Visits from Start of Care: 3 Plan: Patient agrees to discharge.  Patient goals were partialy met. Patient is being discharged due to - not returning after last session.    Sedalia Muta, PT, DPT 11:31 AM  01/10/22

## 2018-12-16 ENCOUNTER — Ambulatory Visit: Payer: Medicare Other | Admitting: Physical Therapy

## 2018-12-21 ENCOUNTER — Other Ambulatory Visit: Payer: Self-pay

## 2018-12-21 ENCOUNTER — Telehealth: Payer: Self-pay | Admitting: Physical Therapy

## 2018-12-21 ENCOUNTER — Ambulatory Visit (INDEPENDENT_AMBULATORY_CARE_PROVIDER_SITE_OTHER): Payer: Medicare Other

## 2018-12-21 DIAGNOSIS — Z111 Encounter for screening for respiratory tuberculosis: Secondary | ICD-10-CM | POA: Diagnosis not present

## 2018-12-21 NOTE — Telephone Encounter (Signed)
Tanya Leon was contacted today regarding the temporary closing of OP Rehab Services due to Covid-19.  Therapist discussed with pt her current home program with pt reporting feeling good about what she has been given to date. Advised pt that our front office will call to schedule appointments when the office is to reopen. Pt verbalized understanding.    [facility] Rehabilitation Services will follow up with patients when we are able to resume care.  Willow Ora, PTA, [place] [address], [place], [place] [address] [phone] 12/21/18, 2:31 PM   [facility] [address] [place], [place]  [address] Phone:  [phone] Fax:  [phone] \

## 2018-12-23 ENCOUNTER — Encounter: Payer: Self-pay | Admitting: Family Medicine

## 2018-12-23 ENCOUNTER — Telehealth: Payer: Self-pay | Admitting: Family Medicine

## 2018-12-23 ENCOUNTER — Ambulatory Visit (INDEPENDENT_AMBULATORY_CARE_PROVIDER_SITE_OTHER): Payer: Medicare Other | Admitting: Family Medicine

## 2018-12-23 ENCOUNTER — Encounter: Payer: Medicare Other | Admitting: Physical Therapy

## 2018-12-23 VITALS — BP 137/86 | HR 60 | Temp 98.1°F | Ht 62.0 in | Wt 136.4 lb

## 2018-12-23 DIAGNOSIS — J301 Allergic rhinitis due to pollen: Secondary | ICD-10-CM

## 2018-12-23 DIAGNOSIS — F325 Major depressive disorder, single episode, in full remission: Secondary | ICD-10-CM | POA: Diagnosis not present

## 2018-12-23 DIAGNOSIS — D696 Thrombocytopenia, unspecified: Secondary | ICD-10-CM | POA: Diagnosis not present

## 2018-12-23 DIAGNOSIS — M48061 Spinal stenosis, lumbar region without neurogenic claudication: Secondary | ICD-10-CM | POA: Diagnosis not present

## 2018-12-23 DIAGNOSIS — I272 Pulmonary hypertension, unspecified: Secondary | ICD-10-CM | POA: Diagnosis not present

## 2018-12-23 LAB — TB SKIN TEST
INDURATION: 0 mm
TB Skin Test: NEGATIVE

## 2018-12-23 NOTE — Telephone Encounter (Signed)
Copied from [place] [id number]. Topic: General - Inquiry >> Dec 23, 2018 10:06 AM Virl Axe D wrote: Reason for CRM: Pt called to speak with [place] regarding [employer]. She would like for Tillie Rung to return her call. Please advise. [phone]

## 2018-12-23 NOTE — Telephone Encounter (Signed)
Pt has received call today.  Pt spoke with Dr. Yong Channel.

## 2018-12-23 NOTE — Patient Instructions (Addendum)
webex visit

## 2018-12-23 NOTE — Progress Notes (Signed)
Phone [phone]   Subjective:  Virtual visit via Video note  Our team/I connected with Tanya Leon on 12/23/18 at 11:00 AM EDT by a video enabled telemedicine application (webex- video component did not function during visit) and verified that I am speaking with the correct person using two identifiers.  Location patient: [facility] Location provider: [facility], office Persons participating in the virtual visit: patient  Our team/I discussed the limitations of evaluation and management by telemedicine and the availability of in person appointments. In light of current covid-19 pandemic, patient also understands that we are trying to protect them by minimizing in office contact if at all possible.  The patient expressed consent for telemedicine visit and agreed to proceed.   ROS- No chest pain or shortness of breath. No headache or blurry vision. No fever or cough.    Past Medical History-  Patient Active Problem List   Diagnosis Date Noted  . Poor posture 02/02/2018    Priority: Medium  . Pulmonary hypertension, unspecified ([place]) 07/01/2017    Priority: Medium  . Cervical arthritis 06/30/2017    Priority: Medium  . PVC's (premature ventricular contractions) 03/26/2017    Priority: Medium  . Basal cell carcinoma 07/07/2014    Priority: Medium  . Depression 06/01/2014    Priority: Medium  . SPINAL STENOSIS, LUMBAR 01/25/2008    Priority: Medium  . INSOMNIA, CHRONIC 07/22/2007    Priority: Medium  . Senile purpura ([place]) 07/01/2018    Priority: Low  . Failed left unicompartmental knee replacement ([place]) 11/24/2015    Priority: Low  . Thrombocytopenia ([place]) 05/03/2015    Priority: Low  . 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 Glaucoma 06/01/2014    Priority: Low  . 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 COLONIC POLYPS 01/13/2009    Priority: Low  . 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 Allergic  rhinitis 05/21/2007    Priority: Low  . Osteopenia 02/26/2007    Priority: Low  . Moderate mitral regurgitation 07/22/2018  . History of adenomatous polyp of colon     Medications- reviewed and updated Current Outpatient Medications  Medication Sig Dispense Refill  . aspirin-acetaminophen-caffeine (EXCEDRIN EXTRA STRENGTH) 250-250-65 MG per tablet Take 2 tablets by mouth every 6 (six) hours as needed for headache. Reported on 11/03/2015    . Azelaic Acid (FINACEA) 15 % cream Apply 1 application topically 2 (two) times daily. After skin is thoroughly washed and patted dry, gently but thoroughly massage a thin film of azelaic acid cream into the affected area twice daily, in the morning and evening.    Marland Kitchen azithromycin (AZASITE) 1 % ophthalmic solution Place 1 drop into both eyes See admin instructions. 1 drop applied to each eyelids for 1st 14 days of each month    . Biotin 5000 MCG TABS Take 5,000 mcg by mouth every evening.    . carboxymethylcellulose (REFRESH PLUS) 0.5 % SOLN Place 1 drop into both eyes 4 (four) times daily as needed (Dry eyes.).     Marland Kitchen Cholecalciferol (VITAMIN D3) 50 MCG (2000 UT) TABS Take 2,000 Units by mouth every evening.    . Ciclopirox 1 % shampoo Apply 1 each topically once a week.     . cycloSPORINE (RESTASIS) 0.05 % ophthalmic emulsion Place 1 drop into both eyes 2 (two) times daily.     . dorzolamide (TRUSOPT) 2 % ophthalmic solution Place 1 drop  into the left eye 2 (two) times daily.  5  . DULoxetine (CYMBALTA) 60 MG capsule TAKE 1 CAPSULE BY MOUTH EVERY DAY (Patient taking differently: Take 60 mg by mouth daily. ) 90 capsule 3  . estradiol (VIVELLE-DOT) 0.1 MG/24HR patch Place 1 patch onto the skin 2 (two) times a week. Fridays & Monday    . EVENING PRIMROSE OIL PO Take 1,300 mg by mouth 2 (two) times daily.    . fluocinonide (LIDEX) 0.05 % external solution Apply 1 application topically once a week. Applied to scalp after shampooing    . fluticasone (FLONASE) 50  MCG/ACT nasal spray SHAKE LIQUID AND USE 2 SPRAYS IN EACH NOSTRIL DAILY (Patient taking differently: Place 1 spray into both nostrils daily. ) 48 g 3  . Lutein 20 MG TABS Take 20 mg by mouth 2 (two) times daily.     . Multiple Vitamin (MULTIVITAMIN WITH MINERALS) TABS tablet Take 1 tablet by mouth daily.    . Multiple Vitamins-Minerals (PRESERVISION AREDS 2 PO) Take 1 tablet by mouth 2 (two) times daily.    . NONFORMULARY OR COMPOUNDED ITEM [facility]:  Antifungal Nail Lacquer - Terbinafine 3%, Fluconazole 2%, Tea Tree Oil 5%, Urea 10%, Ibuprofen 2%, in DMSO suspension #74ml, apply to affected nails daily at bedtime or twice daily. (Patient taking differently: Apply 1 application topically at bedtime. [facility]:  Antifungal Nail Lacquer - Terbinafine 3%, Fluconazole 2%, Tea Tree Oil 5%, Urea 10%, Ibuprofen 2%, in DMSO suspension #26ml, apply to affected nails daily at bedtime or twice daily.) 100 each 11  . olopatadine (PATANOL) 0.1 % ophthalmic solution Place 1 drop into both eyes 2 (two) times daily as needed (for Spring/Summer allergies.).     Marland Kitchen omega-3 acid ethyl esters (LOVAZA) 1 G capsule Take 1,000 mg by mouth 2 (two) times daily.     L[place] (OYSTER CALCIUM) 500 MG TABS tablet Take 500 mg of elemental calcium by mouth 2 (two) times daily.    . polyethylene glycol powder (GLYCOLAX/MIRALAX) powder Take 17 g by mouth daily.     . pregabalin (LYRICA) 25 MG capsule TAKE 1 CAPSULE IN THE MORNING AND TAKE 1 CAPSULE IN THE EVENING (Patient taking differently: Take 25 mg by mouth 2 (two) times daily. TAKE 1 CAPSULE IN THE MORNING AND TAKE 1 CAPSULE IN THE EVENING) 180 capsule 1  . tiZANidine (ZANAFLEX) 2 MG tablet TAKE 1 TABLET BY MOUTH EVERYDAY AT BEDTIME 90 tablet 1  . amoxicillin (AMOXIL) 500 MG capsule Take 2,000 mg by mouth See admin instructions. Take 4 capsules (2000 mg) by mouth 1 hour prior to dental procedures.  0   No current facility-administered medications for this  visit.      Objective:  BP 137/86 (BP Location: Left Arm, Patient Position: Sitting, Cuff Size: Normal)   Pulse 60   Temp 98.1 F (36.7 C) (Oral)   Ht 5\' 2"  (1.575 m)   Wt 136 lb 6.1 oz (61.9 kg)   BMI 24.94 kg/m  Sounds to be resting comfortably No labored breathing     Assessment and Plan    #Colon polyps S: Patient with precancerous colon polyps- has been recommended to have surgery to remove these A/P: Continue to follow- current plans in limbo with COVID-19    # Depression S: well controlled on cymbalta 60mg    Now off ambien for several months Depression screen [facility] 2/9 12/23/2018  Decreased Interest 0  Down, Depressed, Hopeless 0  PHQ - 2 Score  0  Altered sleeping 0  Tired, decreased energy 0  Change in appetite 0  Feeling bad or failure about yourself  0  Trouble concentrating 0  Moving slowly or fidgety/restless 0  Suicidal thoughts 0  PHQ-9 Score 0  Difficult doing work/chores Not difficult at all  Some recent data might be hidden  A/P: Stable. Continue current medications.   #Spinal stenosis, lumbar S: compliant with Lyrica 25 mg twice a day. Still having pain in the low back.  Neck comes and goes. Working with physical therapy- doing home exercises. Using tizanidine nightly and that helps A/P:  Stable. Continue current medications.    #Allergic rhinitis S:complaint with flonase.  Reasonable control-some worsening and beginning of spring A/P:  Stable. Continue current medications.    #Thrombocytopenia S: Intermittent issue-resolved on most recent check A/P: We will continue to monitor- given current COVID-19 pandemic we opted to defer labs until next visit in 6 months  #Senile purpura S: easy bruising and bleeding somewhat better lately A/P: Doing better-continue to monitor  #Mild pulmonary hypertension S: Following with cardiology.  No recent issues A/P: Stable-continue cardiology follow-up.  Has echocardiogram planned in October    Future  Appointments  Date Time Provider [place]  07/27/2019 10:30 AM [id number]   Return in about 6 months (around 06/25/2019) for follow up- or sooner if needed.  Lab/Order associations: Major depressive disorder with single episode, in full remission ([place])  SPINAL STENOSIS, LUMBAR  Seasonal allergic rhinitis due to pollen  Thrombocytopenia ([place])  Pulmonary hypertension, unspecified ([place]), Chronic  Return precautions advised.  Garret Reddish, MD

## 2019-01-06 ENCOUNTER — Telehealth: Payer: Self-pay | Admitting: Physical Therapy

## 2019-01-06 ENCOUNTER — Ambulatory Visit: Payer: Medicare Other | Admitting: Family Medicine

## 2019-01-06 NOTE — Telephone Encounter (Signed)
Tanya Leon was contacted today regarding the temporary closing of OP Rehab Services due to Covid-19.  Therapist discussed current HEP and option for telehealth visit.   Patient is not interested in further information for an e-visit, virtual check in, or telehealth visit at this time.  [facility] Rehabilitation Services will follow up with her when we are able to resume care.  Guido Sander, [place] [address] [place], [place]  [address] Phone:  [phone] Fax:  [phone] \

## 2019-01-24 ENCOUNTER — Other Ambulatory Visit: Payer: Self-pay | Admitting: Family Medicine

## 2019-01-26 ENCOUNTER — Telehealth: Payer: Self-pay | Admitting: Family Medicine

## 2019-01-26 NOTE — Telephone Encounter (Signed)
Pregabalin 25mg  Last filled 07/29/18  #180/1 Last ov 12/23/18

## 2019-02-04 NOTE — Telephone Encounter (Signed)
Rx has been verbally given to pharmacist. Other Rx has been canceled from [facility] Location. Pt notified of update! No further action needed!

## 2019-02-04 NOTE — Telephone Encounter (Signed)
See request °

## 2019-02-04 NOTE — Telephone Encounter (Signed)
Pt called stating that [facility] cannot transfer RX for her on Lyrica because it is controlled. She is [facility] [place]. She has recently moved to [facility] in [place] and [facility] [place] [facility].    [id number] [address], [place] [phone] (Phone) [phone] (Fax)

## 2019-02-19 ENCOUNTER — Telehealth: Payer: Medicare Other

## 2019-02-19 ENCOUNTER — Ambulatory Visit (INDEPENDENT_AMBULATORY_CARE_PROVIDER_SITE_OTHER)
Admission: RE | Admit: 2019-02-19 | Discharge: 2019-02-19 | Disposition: A | Discharge status: Home / Self Care | Payer: Medicare Other | Source: Ambulatory Visit

## 2019-02-19 DIAGNOSIS — M542 Cervicalgia: Secondary | ICD-10-CM | POA: Diagnosis not present

## 2019-02-19 DIAGNOSIS — S161XXA Strain of muscle, fascia and tendon at neck level, initial encounter: Secondary | ICD-10-CM

## 2019-02-19 MED ORDER — TIZANIDINE HCL 2 MG PO TABS: 2.0000 mg | ORAL_TABLET | Freq: Every day | ORAL | 0 refills | Status: DC

## 2019-02-19 MED ORDER — DICLOFENAC SODIUM 1 % TD GEL: 2.0000 g | Freq: Four times a day (QID) | TRANSDERMAL | 0 refills | Status: DC

## 2019-02-19 NOTE — ED Provider Notes (Signed)
Virtual Visit via Video Note:  Tanya Leon  initiated request for Telemedicine visit with [facility] team. I connected with Sula Rumple  on 02/19/2019 at 1:56 PM  for a synchronized telemedicine visit using a video enabled HIPPA compliant telemedicine application. I verified that I am speaking with Sula Rumple  using two identifiers. Zigmund Gottron, NP  was physically located in a [facility] Urgent care site and JODEAN VALADE was located at a different location.   The limitations of evaluation and management by telemedicine as well as the availability of in-person appointments were discussed. Patient was informed that she  may incur a bill ( including co-pay) for this virtual visit encounter. Sula Rumple  expressed understanding and gave verbal consent to proceed with virtual visit.     History of Present Illness:Tanya Leon  is a 78 y.o. female presents with complaints of neck pain. Primarily R>L. Started approximately 3 weeks ago after moving. She had lifted items above her head and felt strain to the neck. The pain has been intermittent since. Worse with certain movements. She feels that working on her computer more has also caused increased strain to it. She notices it when she applies her eye drops with the movement of her head. No numbness, tingling weakness to her arms or hands. Denies any previous neck surgery. States she has known arthritis to neck on imaging. Uses zanaflex which does help some, taking 2mg . States she has taken 4mg  in the past and tolerated well. Takes it for back pain.   Past Medical History:  Diagnosis Date  . Allergy 1973   Seasonal  . Arthritis   . Blood transfusion without reported diagnosis 08-15-08   Following hip replacement  . Cancer (HCC)    hx of skin cancer   . Cataract 1998   Cataract surgery both eyes  . Colon polyps    adenomatous  . Complication of anesthesia    " pt stated they gave me too much - kept having to be  reminded to breathe   . Constipation    opiod induced associated with back pain  . Depression    hx of   . Diverticulosis   . DIVERTICULOSIS, COLON 01/13/2009  . Glaucoma 05-12-14  . Headache    history of migraines  . Heart murmur    benign, dagnosed around age 26,prophylactic antibiotic  . Low back pain   . Lumbar spondylolysis   . Osteopenia   . Pneumonia    hx of as a baby   . Pulmonary hypertension ([place])   . PVC's (premature ventricular contractions)   . Rectocele   . Tubular adenoma of colon     Allergies  Allergen Reactions  . Crab [Shellfish Allergy] Swelling    Soft shelled crab  . Doxycycline Rash        Observations/Objective:  Alert, oriented. Moving head and neck without difficulty. Indicating pain to right trapezius and right sternocleidomastoid.    Assessment and Plan: Cervical strain likely. Will briefly increase zanaflex to 4mg  qhs prn. Topical voltaren as well. Heat, massage, exercises provided. Follow up with PCP as needed for persistent symptoms. Patient verbalized understanding and agreeable to plan.    Follow Up Instructions:    I discussed the assessment and treatment plan with the patient. The patient was provided an opportunity to ask questions and all were answered. The patient agreed with the plan and demonstrated an understanding of the instructions.  The patient was advised to call back or seek an in-person evaluation if the symptoms worsen or if the condition fails to improve as anticipated.  I provided 11 minutes of non-face-to-face time during this encounter.    Zigmund Gottron, NP  02/19/2019 1:56 PM         Zigmund Gottron, NP 02/19/19 [id number]

## 2019-02-19 NOTE — Discharge Instructions (Signed)
Moist heat, massage, exercises- see attached.  Voltaren gel topically 4 times a day as needed.  May increase to 4mg  of zanaflex as needed.  Please follow up with your primary care provider as needed for persistent symptoms.

## 2019-03-02 ENCOUNTER — Ambulatory Visit: Payer: Self-pay | Admitting: Surgery

## 2019-03-02 NOTE — H&P (Signed)
CC: Referral by Dr. Hilarie Fredrickson for endoscopically unresectable adenomatous tissue around the ileocecal valve  HPI: Tanya. Tanya Leon is a very pleasant 32yoF with hx of arthritis, ?OSA, mild pulmonary HTN whom presents as a referral from Dr. Hilarie Fredrickson for endoscopically unresectable/ill-defined adenomatous tissue about the ileocecal valve. She underwent chromo-endoscopy with Dr. Hilarie Fredrickson 11/13/2022 history of colon polyps. She is found with chromo-endoscopy to have post polypectomy scarring in the cecum, 2 sessile polyps in the cecum which were removed; multiple biopsies and a target management of the ileocecal valve to exclude adenoma. The mucosal and distal side ileocecal valve did not take abstaining readily and the tissue did not appear overtly adenomatous but biopsies were taken. These biopsies returned as tubular adenoma. The margins of where this tissue began and it was not clear on endoscopy and therefore additional attempts at removal were deemed not to be feasible by Dr. Hilarie Fredrickson. She was therefore referred to [place] for further evaluation. She also had multiple small bowel diverticula sigmoid, descending, hepatic flexure, ascending colon. 4 mm polyp was removed from the sigmoid but the tissue was not retrieved. Normal-appearing colocolonic anastomosis was also found. Internal hemorrhoids were seen.  Her prior colonoscopy was done 07/2017 which demonstrated a 15-20 mm polyp in the cecum that was flat and removed in a piecemeal manner. 2 other diminutive polyps were removed. All polypoid tissue returned as tubular adenoma without dysplasia.  PMH: ARthritis (managed with prn NSAIDs, denies steroid use); possible OSA (workup underway with Dr. Gwenlyn Found - cardiology); also seeing for mild pulmonary HTN  PSH: Exploratory laparotomy with TAH/BSO and end colostomy - 2007 - found to have perforated Diverticulitis at That Time (Dr. Zettie Pho) Laparoscopic colostomy takedown 4-5mo later (Dr. Zettie Pho) Repair of low midline  incisional hernia with mesh, Dr. Dalbert Batman 2012  FHx: She reports her mother had breast cancer at advanced age. She reports her brother had prostate cancer.  Social: Denies use of tobacco/drugs; daily glass of wine; retired  ROS: A comprehensive 10 system review of systems was completed with the patient and pertinent findings as noted above.  The patient is a 78 year old female.   Past Surgical History Tanya Leon, CMA; 12/07/2018 3:04 PM) Cataract Surgery  Bilateral. Colon Polyp Removal - Colonoscopy  Colon Removal - Partial  Hip Surgery  Right. Hysterectomy (not due to cancer) - Complete  Knee Surgery  Bilateral. Spinal Surgery - Lower Back  Tonsillectomy   Diagnostic Studies History Tanya Leon, CMA; 12/07/2018 3:04 PM) Colonoscopy  within last year Mammogram  within last year Pap Smear  1-5 years ago  Allergies Tanya Leon, CMA; 12/07/2018 3:05 PM) No Known Drug Allergies [12/07/2018]: Allergies Reconciled   Medication History Tanya Leon, CMA; 12/07/2018 3:06 PM) Lyrica (25MG  Capsule, Oral) Active. AzaSite (1% Solution, Ophthalmic) Active. DULoxetine HCl (60MG  Capsule DR Part, Oral) Active. Estradiol (0.1MG /24HR Patch TW, Transdermal) Active. Fluocinonide (0.05% Solution, External) Active. Olopatadine HCl (0.1% Solution, Ophthalmic) Active. tiZANidine HCl (2MG  Tablet, Oral) Active. Medications Reconciled  Social History Tanya Leon, CMA; 12/07/2018 3:04 PM) Alcohol use  Moderate alcohol use. Caffeine use  Carbonated beverages, Coffee. No drug use  Tobacco use  Never smoker.  Family History Tanya Leon, [place]; 12/07/2018 3:04 PM) Arthritis  Brother, Mother, Sister. Breast Cancer  Mother. Depression  Mother, Sister. Heart Disease  Father, Mother. Hypertension  Sister. Ischemic Bowel Disease  Brother, Mother. Prostate Cancer  Brother.  Pregnancy / Birth History Tanya Leon, [place]; 12/07/2018 3:04 PM) Age at menarche  60  years. Age of menopause  [age] Contraceptive History  Oral contraceptives. Length (months) of breastfeeding  3-6 Maternal age  59-25 Para  2  Other Problems Tanya Leon, CMA; 12/07/2018 3:04 PM) Arthritis  Back Pain  Bladder Problems  Cancer  Depression  Diverticulosis  Heart murmur  Other disease, cancer, significant illness  Transfusion history     Review of Systems ([place]; 12/07/2018 3:04 PM) General Not Present- Appetite Loss, Chills, Fatigue, Fever, Night Sweats, Weight Gain and Weight Loss. Skin Present- Dryness. Not Present- Change in Wart/Mole, Hives, Jaundice, New Lesions, Non-Healing Wounds, Rash and Ulcer. HEENT Present- Hearing Loss, Seasonal Allergies and Wears glasses/contact lenses. Not Present- Earache, Hoarseness, Nose Bleed, Oral Ulcers, Ringing in the Ears, Sinus Pain, Sore Throat, Visual Disturbances and Yellow Eyes. Respiratory Present- Snoring. Not Present- Bloody sputum, Chronic Cough, Difficulty Breathing and Wheezing. Breast Not Present- Breast Mass, Breast Pain, Nipple Discharge and Skin Changes. Cardiovascular Present- Palpitations and Shortness of Breath. Not Present- Chest Pain, Difficulty Breathing Lying Down, Leg Cramps, Rapid Heart Rate and Swelling of Extremities. Gastrointestinal Not Present- Abdominal Pain, Bloating, Bloody Stool, Change in Bowel Habits, Chronic diarrhea, Constipation, Difficulty Swallowing, Excessive gas, Gets full quickly at meals, Hemorrhoids, Indigestion, Nausea, Rectal Pain and Vomiting. Female Genitourinary Present- Nocturia. Not Present- Frequency, Painful Urination, Pelvic Pain and Urgency. Musculoskeletal Present- Back Pain and Joint Stiffness. Not Present- Joint Pain, Muscle Pain, Muscle Weakness and Swelling of Extremities. Neurological Present- Trouble walking. Not Present- Decreased Memory, Fainting, Headaches, Numbness, Seizures, Tingling, Tremor and Weakness. Psychiatric Not Present- Anxiety,  Bipolar, Change in Sleep Pattern, Depression, Fearful and Frequent crying. Endocrine Not Present- Cold Intolerance, Excessive Hunger, Hair Changes, Heat Intolerance, Hot flashes and New Diabetes. Hematology Present- Easy Bruising. Not Present- Blood Thinners, Excessive bleeding, Gland problems, HIV and Persistent Infections.  Vitals (Sabrina Canty CMA; 12/07/2018 3:06 PM) 12/07/2018 3:06 PM Weight: 135.8 lb Height: 62in Body Surface Area: 1.62 m Body Mass Index: 24.84 kg/m  Temp.: 97.75F(Temporal)  Pulse: 90 (Regular)  P.OX: 97% (Room air) BP: 144/84 (Sitting, Left Arm, Standard)       Physical Exam Harrell Gave M. Lakrisha Iseman MD; 12/07/2018 4:13 PM) The physical exam findings are as follows: Note:Constitutional: No acute distress; conversant; no deformities Eyes: Moist conjunctiva; no lid lag; anicteric sclerae; pupils equal round and reactive to light Neck: Trachea midline; no palpable thyromegaly Lungs: Normal respiratory effort; no tactile fremitus CV: Regular rate and rhythm; no palpable thrill; no pitting edema GI: Abdomen soft, nontender, nondistended; no palpable hepatosplenomegaly; scars consistent with stated surgical history MSK: Normal gait; no clubbing/cyanosis Psychiatric: Appropriate affect; alert and oriented 3 Lymphatic: No palpable cervical or axillary lymphadenopathy    Assessment & Plan Harrell Gave M. Jilleen Essner MD; 12/07/2018 4:17 PM) POLYP OF COLON, ADENOMATOUS (D12.6) Story: Tanya. Mckillop is a very pleasant 21yoF with hx of arthritis, ?OSA, ?pulmonary HTN (Dr. Gwenlyn Found) - here today for evaluation of endoscopically unresectable/ill-defined margin tubular adenomatous tissue at the ileocecal valve on her most recent colonoscopy. This is at the site of a prior 15-20 mm polyp which was removed 07/2017. Due to the margins being now ill-defined and unclear as to where the adenomatous tissue starts and finishes, she was referred here for consideration of right  hemicolectomy Impression: -The anatomy and physiology of the GI tract was discussed at length with the patient. The pathophysiology of colon polyps and cancer was discussed at length with associated pictures using a laparoscopic colorectal surgery handout book. -We discussed options of doing forward. We discussed ongoing observation with the risk of malignant  degeneration. We also discussed the possibility of dysplasia/cancer being present but not found on biopsy although the areas on colonoscopy did not appear particularly worrisome for malignancy -We discussed right hemicolectomy-laparoscopic and potential open techniques. -Prior to this, she'll need clearance from her cardiologist-Dr. Gwenlyn Found -The procedure, material risks (including, but not limited to, pain, bleeding, infection, scarring, need for blood transfusion, damage to surrounding structures- blood vessels/nerves/viscus/organs, damage to ureter, urine leak, leak from anastomosis, need for additional procedures, need for stoma which may be permanent, hernia, recurrence, DVT/PE, pneumonia, heart attack, stroke, death) benefits and alternatives to surgery were discussed at length. The patient's and her husband's questions were answered to their satisfaction, they voiced understanding and elected to proceed with surgery. Additionally, we discussed typical postoperative expectations and the recovery process. -She is moving into a retirement community locally 01/29/2019. She has requested to delay her procedure until she has completed this and is tentatively planning June/July. We discussed potential risk of progression of the lesion in the interval as well.  Signed electronically by Ileana Roup, MD (12/07/2018 4:19 PM)

## 2019-03-08 DIAGNOSIS — H04123 Dry eye syndrome of bilateral lacrimal glands: Secondary | ICD-10-CM | POA: Diagnosis not present

## 2019-03-08 DIAGNOSIS — H53422 Scotoma of blind spot area, left eye: Secondary | ICD-10-CM | POA: Diagnosis not present

## 2019-03-08 DIAGNOSIS — H401132 Primary open-angle glaucoma, bilateral, moderate stage: Secondary | ICD-10-CM | POA: Diagnosis not present

## 2019-03-08 DIAGNOSIS — H40052 Ocular hypertension, left eye: Secondary | ICD-10-CM | POA: Diagnosis not present

## 2019-03-29 ENCOUNTER — Encounter: Payer: Self-pay | Admitting: Family Medicine

## 2019-03-29 DIAGNOSIS — N6323 Unspecified lump in the left breast, lower outer quadrant: Secondary | ICD-10-CM | POA: Diagnosis not present

## 2019-03-31 ENCOUNTER — Encounter: Payer: Self-pay | Admitting: Family Medicine

## 2019-03-31 DIAGNOSIS — N6002 Solitary cyst of left breast: Secondary | ICD-10-CM | POA: Diagnosis not present

## 2019-04-05 ENCOUNTER — Telehealth: Payer: Self-pay | Admitting: Internal Medicine

## 2019-04-05 NOTE — Telephone Encounter (Signed)
Patient called said that she would like to talk to Dr. Hilarie Fredrickson before her Colon resection surgery she has on 04-30-19. She would like to know how her life will change after.

## 2019-04-05 NOTE — Telephone Encounter (Signed)
See note below

## 2019-04-06 NOTE — Telephone Encounter (Signed)
Please schedule on 04/13/2019 at 250, there is currently an opening

## 2019-04-06 NOTE — Telephone Encounter (Signed)
Appt scheduled

## 2019-04-12 ENCOUNTER — Telehealth: Payer: Self-pay | Admitting: *Deleted

## 2019-04-12 NOTE — Telephone Encounter (Signed)
Covid-19 screening questions   Do you now or have you had a fever in the last 14 days? NO   Do you have any respiratory symptoms of shortness of breath or cough now or in the last 14 days? NO  Do you have any family members or close contacts with diagnosed or suspected Covid-19 in the past 14 days? NO  Have you been tested for Covid-19 and found to be positive? NO        

## 2019-04-13 ENCOUNTER — Ambulatory Visit (INDEPENDENT_AMBULATORY_CARE_PROVIDER_SITE_OTHER): Payer: Medicare Other | Admitting: Internal Medicine

## 2019-04-13 ENCOUNTER — Encounter: Payer: Self-pay | Admitting: Internal Medicine

## 2019-04-13 VITALS — BP 122/84 | HR 61 | Temp 97.7°F | Wt 137.0 lb

## 2019-04-13 DIAGNOSIS — K635 Polyp of colon: Secondary | ICD-10-CM

## 2019-04-13 DIAGNOSIS — D120 Benign neoplasm of cecum: Secondary | ICD-10-CM

## 2019-04-13 DIAGNOSIS — Z86010 Personal history of colonic polyps: Secondary | ICD-10-CM

## 2019-04-13 NOTE — Patient Instructions (Signed)
Good luck on your upcoming surgery!  Follow up with our office after surgery as needed.  If you are age 79 or older, your body mass index should be between 23-30. Your Body mass index is 25.06 kg/m. If this is out of the aforementioned range listed, please consider follow up with your Primary Care Provider.  If you are age 40 or younger, your body mass index should be between 19-25. Your Body mass index is 25.06 kg/m. If this is out of the aformentioned range listed, please consider follow up with your Primary Care Provider.

## 2019-04-13 NOTE — Progress Notes (Signed)
   Subjective:    Patient ID: Tanya Leon, female    DOB: September 14, 1941, 78 y.o.   MRN: [id number]  HPI Tanya Leon is a 78 year old female with a history of adenomatous colon polyps, colonic diverticulosis, constipation, pulmonary hypertension who returns today to discuss persistent adenomatous colon polyps near the ileocecal valve and plans for ileocecectomy.  She is here today with her husband.  She had a surveillance colonoscopy with chromoendoscopy which I performed on 11/13/2018.  This was to follow-up previous piecemeal resection of a flat adenomatous polyp at the ileocecal valve and the junction of ileum and cecum.  There was no definitive polypoid tissue seen despite chromoendoscopy though biopsy showed persistent adenoma.  I referred her to see Dr. Dema Severin and he discussed ileocecectomy.  This had been scheduled but delayed due to the COVID-19 pandemic.  It is currently scheduled for 04/30/2019 and she has many questions today regarding the surgery.  She is feeling well.  Bowel movements occur 5-7 times per day with MiraLAX.  Stools are not loose or watery but more formed.  She feels that her rectocele definitively affects her bowel habits.  She has not yet seen Dr. Zigmund Daniel with urogynecology to this regard.  Her questions include discussion about exactly which part of the large and small intestine will be removed, how he will decide to remove which area, what percentage of her colon will she be left with, could elective surgeries be discontinued again due to the COVID-19 pandemic, recovery of her normal GI function, who will navigate the process with her or any challenges that arise postsurgically.   Review of Systems As per HPI, otherwise negative  Current Medications, Allergies, Past Medical History, Past Surgical History, Family History and Social History were reviewed in [employer] record.     Objective:   Physical Exam BP 122/84 (BP Location: Right Arm,  Patient Position: Sitting, Cuff Size: Normal)   Pulse 61   Temp 97.7 F (36.5 C) (Oral)   Wt 137 lb (62.1 kg)   SpO2 95%   BMI 25.06 kg/m  Gen: awake, alert, NAD Neuro: nonfocal       Assessment & Plan:  78 year old female with a history of adenomatous colon polyps, colonic diverticulosis, constipation, pulmonary hypertension who returns today to discuss persistent adenomatous colon polyps near the ileocecal valve and plans for ileocecectomy.  1.  Recurrent flat adenoma at the ileocecal valve and cecum --despite multiple prior piecemeal resection polypectomy and chromoendoscopy there is still adenomatous change at the ileocecal valve.  After multiple attempts at colonoscopy it is evident that we are not going to be able to remove this adenomatous lesion endoscopically.  She understands this and understands ileocecectomy.  We spent a great deal of time today discussing and answering her questions.  I ensured her that I would continue to help manage her GI conditions in the postoperative period and beyond.  She is planning to proceed with the schedule ileocecectomy with Dr. Dema Severin later this month.  She can certainly call me with any questions as they arise  30 minutes spent with the patient today. Greater than 50% was spent in counseling and coordination of care with the patient

## 2019-04-16 ENCOUNTER — Encounter: Payer: Self-pay | Admitting: Family Medicine

## 2019-04-23 DIAGNOSIS — D126 Benign neoplasm of colon, unspecified: Secondary | ICD-10-CM | POA: Diagnosis not present

## 2019-04-26 NOTE — Patient Instructions (Addendum)
YOU NEED TO HAVE A COVID 19 TEST ON_____TUESDAY, July 28TH_______, THIS TEST MUST BE DONE BEFORE SURGERY, GO TO [facility] , [id number]. ONCE YOUR COVID TEST IS COMPLETED, PLEASE BEGIN THE QUARANTINE INSTRUCTIONS AS OUTLINED IN YOUR HANDOUT.                 Tanya LACEK     Your procedure is scheduled on: 04-30-2019   Report to [facility] Main  Entrance     Report to [facility] at 5:30AM    1 VISITOR IS ALLOWED TO WAIT IN WAITING ROOM  ONLY DAY OF YOUR SURGERY.     Call this number if you have problems the morning of surgery [phone]    Remember: PLEASE FOLLOW ALL BOWEL PREP INSTRUCTIONS PROVIDED BY YOUR SURGEON IF ANY!  DRINK 2 PRESURGERY ENSURE DRINKS THE NIGHT BEFORE SURGERY AT 10:00 PM. NO SOLIDS AFTER MIDNIGHT THE NIGHT PRIOR TO THE SURGERY.  YOU MAY DRINK CLEAR LIQUIDS UNTIL __4:30AM_____ THE NEXT MORNING (SEE DIET BELOW).  AT ____4:30AM____ THE DAY OF SURGERY, FINISH THE LAST PRE-SURGERY DRINK. NOTHING BY MOUTH AFTER THE LAST ENSURE DRINK!     BRUSH YOUR TEETH MORNING OF SURGERY AND RINSE YOUR MOUTH OUT, NO CHEWING GUM CANDY OR MINTS.     CLEAR LIQUID DIET   Foods Allowed                                                                     Foods Excluded  Coffee and tea, regular and decaf                             liquids that you cannot  Plain Jell-O any favor except red or purple                                           see through such as: Fruit ices (not with fruit pulp)                                     milk, soups, orange juice  Iced Popsicles                                    All solid food Carbonated beverages, regular and diet                                    Cranberry, grape and apple juices Sports drinks like Gatorade Lightly seasoned clear broth or consume(fat free) Sugar, honey syrup  Sample Menu Breakfast                                Lunch  Supper Cranberry juice                     Beef broth                            Chicken broth Jell-O                                     Grape juice                           Apple juice Coffee or tea                        Jell-O                                      Popsicle                                                Coffee or tea                        Coffee or tea  _____________________________________________________________________       Take these medicines the morning of surgery with A SIP OF WATER: DULOXETINE, FLONASE, EYE DROPS  DO NOT TAKE ANY DIABETIC MEDICATIONS DAY OF YOUR SURGERY                                 You may not have any metal on your body including hair pins and              piercings  Do not wear jewelry, make-up, lotions, powders or perfumes, deodorant             Do not wear nail polish.  Do not shave  48 hours prior to surgery.     Do not bring valuables to the hospital. [place].  Contacts, dentures or bridgework may not be worn into surgery.  Leave suitcase in the car. After surgery it may be brought to your room.                   Please read over the following fact sheets you were given: _____________________________________________________________________             [facility] - Preparing for Surgery Before surgery, you can play an important role.  Because skin is not sterile, your skin needs to be as free of germs as possible.  You can reduce the number of germs on your skin by washing with CHG (chlorahexidine gluconate) soap before surgery.  CHG is an antiseptic cleaner which kills germs and bonds with the skin to continue killing germs even after washing. Please DO NOT use if you have an allergy to CHG or antibacterial soaps.  If your skin becomes reddened/irritated stop using the CHG and inform your nurse when you arrive at Short Stay. Do  not shave (including legs and underarms) for at least 48 hours prior to  the first CHG shower.  You may shave your face/neck. Please follow these instructions carefully:  1.  Shower with CHG Soap the night before surgery and the  morning of Surgery.  2.  If you choose to wash your hair, wash your hair first as usual with your  normal  shampoo.  3.  After you shampoo, rinse your hair and body thoroughly to remove the  shampoo.                           4.  Use CHG as you would any other liquid soap.  You can apply chg directly  to the skin and wash                       Gently with a scrungie or clean washcloth.  5.  Apply the CHG Soap to your body ONLY FROM THE NECK DOWN.   Do not use on face/ open                           Wound or open sores. Avoid contact with eyes, ears mouth and genitals (private parts).                       Wash face,  Genitals (private parts) with your normal soap.             6.  Wash thoroughly, paying special attention to the area where your surgery  will be performed.  7.  Thoroughly rinse your body with warm water from the neck down.  8.  DO NOT shower/wash with your normal soap after using and rinsing off  the CHG Soap.                9.  Pat yourself dry with a clean towel.            10.  Wear clean pajamas.            11.  Place clean sheets on your bed the night of your first shower and do not  sleep with pets. Day of Surgery : Do not apply any lotions/deodorants the morning of surgery.  Please wear clean clothes to the hospital/surgery center.  FAILURE TO FOLLOW THESE INSTRUCTIONS MAY RESULT IN THE CANCELLATION OF YOUR SURGERY PATIENT SIGNATURE_________________________________  NURSE SIGNATURE__________________________________  ________________________________________________________________________   Adam Phenix  An incentive spirometer is a tool that can help keep your lungs clear and active. This tool measures how well you are filling your lungs with each breath. Taking long deep breaths may help reverse or  decrease the chance of developing breathing (pulmonary) problems (especially infection) following:  A long period of time when you are unable to move or be active. BEFORE THE PROCEDURE   If the spirometer includes an indicator to show your best effort, your nurse or respiratory therapist will set it to a desired goal.  If possible, sit up straight or lean slightly forward. Try not to slouch.  Hold the incentive spirometer in an upright position. INSTRUCTIONS FOR USE  1. Sit on the edge of your bed if possible, or sit up as far as you can in bed or on a chair. 2. Hold the incentive spirometer in an upright position. 3. Breathe out normally. 4. Place the mouthpiece in  your mouth and seal your lips tightly around it. 5. Breathe in slowly and as deeply as possible, raising the piston or the ball toward the top of the column. 6. Hold your breath for 3-5 seconds or for as long as possible. Allow the piston or ball to fall to the bottom of the column. 7. Remove the mouthpiece from your mouth and breathe out normally. 8. Rest for a few seconds and repeat Steps 1 through 7 at least 10 times every 1-2 hours when you are awake. Take your time and take a few normal breaths between deep breaths. 9. The spirometer may include an indicator to show your best effort. Use the indicator as a goal to work toward during each repetition. 10. After each set of 10 deep breaths, practice coughing to be sure your lungs are clear. If you have an incision (the cut made at the time of surgery), support your incision when coughing by placing a pillow or rolled up towels firmly against it. Once you are able to get out of bed, walk around indoors and cough well. You may stop using the incentive spirometer when instructed by your caregiver.  RISKS AND COMPLICATIONS  Take your time so you do not get dizzy or light-headed.  If you are in pain, you may need to take or ask for pain medication before doing incentive spirometry.  It is harder to take a deep breath if you are having pain. AFTER USE  Rest and breathe slowly and easily.  It can be helpful to keep track of a log of your progress. Your caregiver can provide you with a simple table to help with this. If you are using the spirometer at home, follow these instructions: [place] IF:   You are having difficultly using the spirometer.  You have trouble using the spirometer as often as instructed.  Your pain medication is not giving enough relief while using the spirometer.  You develop fever of 100.5 F (38.1 C) or higher. SEEK IMMEDIATE MEDICAL CARE IF:   You cough up bloody sputum that had not been present before.  You develop fever of 102 F (38.9 C) or greater.  You develop worsening pain at or near the incision site. MAKE SURE YOU:   Understand these instructions.  Will watch your condition.  Will get help right away if you are not doing well or get worse. Document Released: 01/27/2007 Document Revised: 12/09/2011 Document Reviewed: 03/30/2007 [facility] Patient Information 2014 [place], [place].   ________________________________________________________________________

## 2019-04-27 ENCOUNTER — Other Ambulatory Visit (HOSPITAL_COMMUNITY)
Admission: RE | Admit: 2019-04-27 | Discharge: 2019-04-27 | Disposition: A | Discharge status: Home / Self Care | Payer: Medicare Other | Source: Ambulatory Visit | Attending: Surgery | Admitting: Surgery

## 2019-04-27 ENCOUNTER — Other Ambulatory Visit: Payer: Self-pay

## 2019-04-27 ENCOUNTER — Encounter (HOSPITAL_COMMUNITY)
Admission: RE | Admit: 2019-04-27 | Discharge: 2019-04-27 | Disposition: A | Discharge status: Home / Self Care | Payer: Medicare Other | Source: Ambulatory Visit | Attending: Surgery | Admitting: Surgery

## 2019-04-27 ENCOUNTER — Encounter (HOSPITAL_COMMUNITY): Payer: Self-pay

## 2019-04-27 DIAGNOSIS — Z1159 Encounter for screening for other viral diseases: Secondary | ICD-10-CM | POA: Insufficient documentation

## 2019-04-27 DIAGNOSIS — I272 Pulmonary hypertension, unspecified: Secondary | ICD-10-CM | POA: Diagnosis not present

## 2019-04-27 DIAGNOSIS — Z8719 Personal history of other diseases of the digestive system: Secondary | ICD-10-CM | POA: Diagnosis not present

## 2019-04-27 DIAGNOSIS — M1990 Unspecified osteoarthritis, unspecified site: Secondary | ICD-10-CM | POA: Diagnosis not present

## 2019-04-27 DIAGNOSIS — K635 Polyp of colon: Secondary | ICD-10-CM | POA: Diagnosis not present

## 2019-04-27 DIAGNOSIS — K660 Peritoneal adhesions (postprocedural) (postinfection): Secondary | ICD-10-CM | POA: Diagnosis not present

## 2019-04-27 DIAGNOSIS — G4733 Obstructive sleep apnea (adult) (pediatric): Secondary | ICD-10-CM | POA: Diagnosis not present

## 2019-04-27 DIAGNOSIS — K621 Rectal polyp: Secondary | ICD-10-CM | POA: Insufficient documentation

## 2019-04-27 DIAGNOSIS — Z01818 Encounter for other preprocedural examination: Secondary | ICD-10-CM | POA: Insufficient documentation

## 2019-04-27 DIAGNOSIS — K648 Other hemorrhoids: Secondary | ICD-10-CM | POA: Diagnosis not present

## 2019-04-27 DIAGNOSIS — Z881 Allergy status to other antibiotic agents status: Secondary | ICD-10-CM | POA: Diagnosis not present

## 2019-04-27 DIAGNOSIS — M8580 Other specified disorders of bone density and structure, unspecified site: Secondary | ICD-10-CM | POA: Diagnosis not present

## 2019-04-27 DIAGNOSIS — Z20828 Contact with and (suspected) exposure to other viral communicable diseases: Secondary | ICD-10-CM | POA: Diagnosis not present

## 2019-04-27 DIAGNOSIS — R001 Bradycardia, unspecified: Secondary | ICD-10-CM | POA: Insufficient documentation

## 2019-04-27 DIAGNOSIS — Z91013 Allergy to seafood: Secondary | ICD-10-CM | POA: Diagnosis not present

## 2019-04-27 DIAGNOSIS — R9431 Abnormal electrocardiogram [ECG] [EKG]: Secondary | ICD-10-CM | POA: Insufficient documentation

## 2019-04-27 DIAGNOSIS — Z79899 Other long term (current) drug therapy: Secondary | ICD-10-CM | POA: Diagnosis not present

## 2019-04-27 DIAGNOSIS — I444 Left anterior fascicular block: Secondary | ICD-10-CM | POA: Insufficient documentation

## 2019-04-27 HISTORY — DX: Presence of external hearing-aid: Z97.4

## 2019-04-27 LAB — CBC WITH DIFFERENTIAL/PLATELET
Abs Immature Granulocytes: 0.01 10*3/uL (ref 0.00–0.07)
Basophils Absolute: 0 10*3/uL (ref 0.0–0.1)
Basophils Relative: 1 %
Eosinophils Absolute: 0.1 10*3/uL (ref 0.0–0.5)
Eosinophils Relative: 3 %
HCT: 43.2 % (ref 36.0–46.0)
Hemoglobin: 13.7 g/dL (ref 12.0–15.0)
Immature Granulocytes: 0 %
Lymphocytes Relative: 33 %
Lymphs Abs: 1.4 10*3/uL (ref 0.7–4.0)
MCH: 30.4 pg (ref 26.0–34.0)
MCHC: 31.7 g/dL (ref 30.0–36.0)
MCV: 95.8 fL (ref 80.0–100.0)
Monocytes Absolute: 0.4 10*3/uL (ref 0.1–1.0)
Monocytes Relative: 10 %
Neutro Abs: 2.3 10*3/uL (ref 1.7–7.7)
Neutrophils Relative %: 53 %
Platelets: 132 10*3/uL — ABNORMAL LOW (ref 150–400)
RBC: 4.51 MIL/uL (ref 3.87–5.11)
RDW: 13.2 % (ref 11.5–15.5)
WBC: 4.3 10*3/uL (ref 4.0–10.5)
nRBC: 0 % (ref 0.0–0.2)

## 2019-04-27 LAB — COMPREHENSIVE METABOLIC PANEL
ALT: 17 U/L (ref 0–44)
AST: 18 U/L (ref 15–41)
Albumin: 4.1 g/dL (ref 3.5–5.0)
Alkaline Phosphatase: 58 U/L (ref 38–126)
Anion gap: 7 (ref 5–15)
BUN: 22 mg/dL (ref 8–23)
CO2: 28 mmol/L (ref 22–32)
Calcium: 9.4 mg/dL (ref 8.9–10.3)
Chloride: 102 mmol/L (ref 98–111)
Creatinine, Ser: 0.63 mg/dL (ref 0.44–1.00)
GFR calc Af Amer: >60 mL/min (ref >60)
GFR calc non Af Amer: >60 mL/min (ref >60)
Glucose, Bld: 87 mg/dL (ref 70–99)
Potassium: 4.5 mmol/L (ref 3.5–5.1)
Sodium: 137 mmol/L (ref 135–145)
Total Bilirubin: 0.6 mg/dL (ref 0.3–1.2)
Total Protein: 6.4 g/dL — ABNORMAL LOW (ref 6.5–8.1)

## 2019-04-27 LAB — APTT: aPTT: 34 seconds (ref 24–36)

## 2019-04-27 LAB — SARS CORONAVIRUS 2 (TAT 6-24 HRS): SARS Coronavirus 2: NEGATIVE

## 2019-04-27 LAB — PROTIME-INR
INR: 1 (ref 0.8–1.2)
Prothrombin Time: 12.8 seconds (ref 11.4–15.2)

## 2019-04-27 LAB — HEMOGLOBIN A1C
Hgb A1c MFr Bld: 5 % (ref 4.8–5.6)
Mean Plasma Glucose: 96.8 mg/dL

## 2019-04-28 ENCOUNTER — Encounter: Payer: Self-pay | Admitting: Family Medicine

## 2019-04-28 DIAGNOSIS — H401132 Primary open-angle glaucoma, bilateral, moderate stage: Secondary | ICD-10-CM | POA: Diagnosis not present

## 2019-04-28 DIAGNOSIS — H04123 Dry eye syndrome of bilateral lacrimal glands: Secondary | ICD-10-CM | POA: Diagnosis not present

## 2019-04-28 DIAGNOSIS — H53422 Scotoma of blind spot area, left eye: Secondary | ICD-10-CM | POA: Diagnosis not present

## 2019-04-28 DIAGNOSIS — H40052 Ocular hypertension, left eye: Secondary | ICD-10-CM | POA: Diagnosis not present

## 2019-04-29 ENCOUNTER — Encounter (HOSPITAL_COMMUNITY): Payer: Self-pay | Admitting: Anesthesiology

## 2019-04-29 MED ORDER — BUPIVACAINE LIPOSOME 1.3 % IJ SUSP
20.0000 mL | Freq: Once | INTRAMUSCULAR | Status: DC
  Filled 2019-04-29: qty 20

## 2019-04-29 NOTE — Anesthesia Preprocedure Evaluation (Addendum)
Anesthesia Evaluation  Patient identified by MRN, date of birth, ID band Patient awake    Reviewed: Allergy & Precautions, NPO status , Patient's Chart, lab work & pertinent test results, reviewed documented beta blocker date and time   History of Anesthesia Complications (+) history of anesthetic complications  Airway Mallampati: II  TM Distance: >3 FB Neck ROM: Full    Dental no notable dental hx. (+) Teeth Intact   Pulmonary pneumonia, resolved,    Pulmonary exam normal breath sounds clear to auscultation       Cardiovascular + Peripheral Vascular Disease  Normal cardiovascular exam+ Valvular Problems/Murmurs MR  Rhythm:Regular Rate:Normal  EKG 7/28/202 SB, LAD non specific ST- T changes  ECHO 07/08/2018 Left ventricle: The cavity size was normal. There was moderate concentric hypertrophy. Systolic function was normal. The estimated ejection fraction was in the range of 60% to 65%. Wall motion was normal; there were no regional wall motion abnormalities. Doppler parameters are consistent with abnormal left ventricular relaxation (grade 1 diastolic dysfunction). Doppler parameters are consistent with indeterminate ventricular filling pressure. - Aortic valve: Transvalvular velocity was within the normal range.   There was no stenosis. There was no regurgitation. - Mitral valve: Transvalvular velocity was within the normal range.   There was no evidence for stenosis. There was mild to moderate   regurgitation. - Left atrium: The atrium was severely dilated. - Right ventricle: The cavity size was normal. Wall thickness was   normal. Systolic function was normal. - Atrial septum: No defect or patent foramen ovale was identified by color flow Doppler. - Tricuspid valve: There was mild regurgitation. - Pulmonary arteries: Systolic pressure was within the normal range. PA peak pressure: 38 mm Hg (S).   Neuro/Psych  Headaches,  PSYCHIATRIC DISORDERS Depression Glaucoma    GI/Hepatic Neg liver ROS, Adenoma ileocecal valve   Endo/Other  negative endocrine ROS  Renal/GU negative Renal ROS  negative genitourinary   Musculoskeletal  (+) Arthritis , Osteoarthritis,  Osteopenia Lumbar stenosis   Abdominal (+) - obese,   Peds  Hematology   Anesthesia Other Findings   Reproductive/Obstetrics                           Anesthesia Physical Anesthesia Plan  ASA: II  Anesthesia Plan: General   Post-op Pain Management:    Induction: Intravenous  PONV Risk Score and Plan: 4 or greater and Ondansetron, Treatment may vary due to age or medical condition and Dexamethasone  Airway Management Planned: Oral ETT  Additional Equipment:   Intra-op Plan:   Post-operative Plan: Extubation in OR  Informed Consent: I have reviewed the patients History and Physical, chart, labs and discussed the procedure including the risks, benefits and alternatives for the proposed anesthesia with the patient or authorized representative who has indicated his/her understanding and acceptance.     Dental advisory given  Plan Discussed with: CRNA and Surgeon  Anesthesia Plan Comments: (See PAT note 04/27/2019, Konrad Felix, PA-C)      Anesthesia Quick Evaluation

## 2019-04-29 NOTE — Progress Notes (Signed)
Anesthesia Chart Review   Case: [id number] Date/Time: 04/30/19 0715   Procedure: LAPAROSCOPIC POSSIBLE OPEN RIGHT HEMICOLECTOMY (Right )   Anesthesia type: General   Pre-op diagnosis: endoscopically unreceptable polyp of ileocecal valve   [facility] ROOM 01 / [facility]   Surgeon: Ileana Roup, MD      DISCUSSION:78 y.o. never smoker with h/o depression, migraines, thrombocytopenia (monitored by PCP), pulmonary HTN, PVC's, polyp of ileocecal valve scheduled for above procedure 04/30/2019 with Dr. Nadeen Landau.   Cardiac clearance received for upcoming procedure 12/09/2018.  Per Cecilie Kicks, NP, "Sula Rumple was last seen on 07/22/18 by Dr. Gwenlyn Found .  Since that day, BREINDY MEADOW has done well with her mitral regurg and pulmonary hypertension.  No known CAD or CVA.  She is active and meets 4 METS of activity. Therefore, based on ACC/AHA guidelines, the patient would be at acceptable risk for the planned procedure without further cardiovascular testing."  Anticipate pt can proceed with planned procedure barring acute status change.   VS: BP (!) 161/71   Pulse 65   Temp 37.1 C (Oral)   Resp 18   Ht 5\' 2"  (1.575 m)   Wt 62.1 kg   SpO2 98%   BMI 25.06 kg/m   PROVIDERS: [name], MD is PCP   Quay Burow, MD is Cardiologist  LABS: Labs reviewed: Acceptable for surgery. (all labs ordered are listed, but only abnormal results are displayed)  Labs Reviewed  CBC WITH DIFFERENTIAL/PLATELET - Abnormal; Notable for the following components:      Result Value   Platelets 132 (*)    All other components within normal limits  COMPREHENSIVE METABOLIC PANEL - Abnormal; Notable for the following components:   Total Protein 6.4 (*)    All other components within normal limits  APTT  HEMOGLOBIN A1C  PROTIME-INR  TYPE AND SCREEN     IMAGES:   EKG: 04/27/2019 Rate 57 bpm Sinus bradycardia Left anterior fascicular block Nonspecific T wave abnormality Abnormal  ECG No significant change since last tracing  CV: Echo 07/08/18 Study Conclusions  - Left ventricle: The cavity size was normal. There was moderate   concentric hypertrophy. Systolic function was normal. The   estimated ejection fraction was in the range of 60% to 65%. Wall   motion was normal; there were no regional wall motion   abnormalities. Doppler parameters are consistent with abnormal   left ventricular relaxation (grade 1 diastolic dysfunction).   Doppler parameters are consistent with indeterminate ventricular   filling pressure. - Aortic valve: Transvalvular velocity was within the normal range.   There was no stenosis. There was no regurgitation. - Mitral valve: Transvalvular velocity was within the normal range.   There was no evidence for stenosis. There was mild to moderate   regurgitation. - Left atrium: The atrium was severely dilated. - Right ventricle: The cavity size was normal. Wall thickness was   normal. Systolic function was normal. - Atrial septum: No defect or patent foramen ovale was identified   by color flow Doppler. - Tricuspid valve: There was mild regurgitation. - Pulmonary arteries: Systolic pressure was within the normal   range. PA peak pressure: 38 mm Hg (S). Past Medical History:  Diagnosis Date  . Allergy 1973   Seasonal  . Arthritis   . Blood transfusion without reported diagnosis 08/15/2008   Following hip replacement  . Cancer (HCC)    hx of skin cancer   . Cataract 1998   Cataract  surgery both eyes  . Colon polyps    adenomatous  . Complication of anesthesia    " pt stated they gave me too much - kept having to be reminded to breathe   . Constipation    opiod induced associated with back pain  . Depression    hx of   . Diverticulosis   . DIVERTICULOSIS, COLON 01/13/2009  . Glaucoma 05-12-14  . Headache    history of migraines  . Heart murmur    benign, dagnosed around age 33,prophylactic antibiotic  . Low back pain   .  Lumbar spondylolysis   . Osteopenia   . Pneumonia    hx of as a baby   . Pulmonary hypertension ([place])    monitored by cards Dr Gwenlyn Found annually via echo   . PVC's (premature ventricular contractions)    i have them periodically , Dr Gwenlyn Found had me cut back on caffeine and that helped   . Rectocele   . Tubular adenoma of colon   . Wears hearing aid in both ears     Past Surgical History:  Procedure Laterality Date  . ABDOMINAL HERNIA REPAIR Right    incisional  . ABDOMINAL HYSTERECTOMY     with ovaries  . BACK SURGERY  2009   L4-L5 with rods  . BIOPSY  11/13/2018   Procedure: BIOPSY;  Surgeon: Jerene Bears, MD;  Location: [facility];  Service: Gastroenterology;;  . BLADDER SUSPENSION N/A 04/09/2017   Procedure: TRANSVAGINAL TAPE (TVT) PROCEDURE;  Surgeon: Bobbye Charleston, MD;  Location: [facility];  Service: Gynecology;  Laterality: N/A;  . CATARACT EXTRACTION, BILATERAL    . cataract left lens replacement     2016  . COLON SURGERY  2007   Rpr abscessed ruptured diverticuli  . COLONOSCOPY    . COLONOSCOPY N/A 08/29/2017   Procedure: COLONOSCOPY;  Surgeon: Jerene Bears, MD;  Location: [facility];  Service: Gastroenterology;  Laterality: N/A;  . COLONOSCOPY WITH PROPOFOL N/A 11/13/2018   Procedure: COLONOSCOPY WITH PROPOFOL with Methylane Blue Injection;  Surgeon: Jerene Bears, MD;  [facility];  Service: Gastroenterology;  Laterality: N/A;  . CYSTOCELE REPAIR N/A 04/09/2017   Procedure: ANTERIOR REPAIR (CYSTOCELE);  Surgeon: Bobbye Charleston, MD;  Location: [facility];  Service: Gynecology;  Laterality: N/A;  . CYSTOSCOPY N/A 04/09/2017   Procedure: CYSTOSCOPY;  Surgeon: Bobbye Charleston, MD;  Location: [facility];  Service: Gynecology;  Laterality: N/A;  . EYE MUSCLE SURGERY    . FRACTURE SURGERY  11-13-17   L wrist-open reduction internal fixation  . GLAUCOMA SURGERY Left 05/12/14  . HERNIA REPAIR  05-28-11   Hernia result of 2007 colon surg  . incisional ab  05/28/11  .  JOINT REPLACEMENT     right hip replacement   . LUMBAR FUSION     L4-L5  . PARTIAL KNEE ARTHROPLASTY Right   . PARTIAL KNEE ARTHROPLASTY Left   . perforated deverticular abscess/laporotomy and colostomy     depression after this  . POLYPECTOMY N/A 08/29/2017   Procedure: POLYPECTOMY;  Surgeon: Jerene Bears, MD;  Location: [facility];  Service: Gastroenterology;  Laterality: N/A;  . POLYPECTOMY  11/13/2018   Procedure: POLYPECTOMY;  Surgeon: Jerene Bears, MD;  [facility];  Service: Gastroenterology;;  . SPINE SURGERY  02-16-08   Fusion L4L5  . stribismus eye surgery Right   . take down of colostomy    . tonsillectomly    . TONSILLECTOMY    . TOTAL HIP  ARTHROPLASTY Right   . TOTAL KNEE REVISION Left 11/24/2015   Procedure: Conversion from left lateral unicompartmental knee arthroplasty to left total knee arthroplasty;  Surgeon: Mcarthur Rossetti, MD;  Location[facility];  Service: Orthopedics;  Laterality: Left;  . TUBAL LIGATION  1976    MEDICATIONS: . amoxicillin (AMOXIL) 500 MG capsule  . aspirin-acetaminophen-caffeine (EXCEDRIN EXTRA STRENGTH) 250-250-65 MG per tablet  . Azelaic Acid (FINACEA) 15 % cream  . azithromycin (AZASITE) 1 % ophthalmic solution  . Biotin 5000 MCG TABS  . Brinzolamide-Brimonidine (SIMBRINZA) 1-0.2 % SUSP  . Calcium-Magnesium 500-250 MG TABS  . carboxymethylcellulose (REFRESH PLUS) 0.5 % SOLN  . Cholecalciferol (VITAMIN D3) 50 MCG (2000 UT) TABS  . Ciclopirox 1 % shampoo  . cycloSPORINE (RESTASIS) 0.05 % ophthalmic emulsion  . diclofenac sodium (VOLTAREN) 1 % GEL  . DULoxetine (CYMBALTA) 60 MG capsule  . estradiol (VIVELLE-DOT) 0.1 MG/24HR patch  . EVENING PRIMROSE OIL PO  . fluocinonide (LIDEX) 0.05 % external solution  . fluticasone (FLONASE) 50 MCG/ACT nasal spray  . Lutein 20 MG TABS  . Multiple Vitamin (MULTIVITAMIN WITH MINERALS) TABS tablet  . Multiple Vitamins-Minerals (PRESERVISION AREDS 2 PO)  . NONFORMULARY OR  COMPOUNDED ITEM  . olopatadine (PATANOL) 0.1 % ophthalmic solution  . omega-3 acid ethyl esters (LOVAZA) 1 G capsule  . polyethylene glycol powder (GLYCOLAX/MIRALAX) powder  . pregabalin (LYRICA) 25 MG capsule  . tiZANidine (ZANAFLEX) 2 MG tablet   No current facility-administered medications for this encounter.    Derrill Memo ON 04/30/2019] bupivacaine liposome (EXPAREL) 1.3 % injection 266 mg     Maia Plan [facility] Pre-Surgical Testing [phone] 04/29/19  10:42 AM

## 2019-04-30 ENCOUNTER — Inpatient Hospital Stay (HOSPITAL_COMMUNITY)
Admission: RE | Admit: 2019-04-30 | Discharge: 2019-05-02 | DRG: 331 | Disposition: A | Discharge status: Home / Self Care | Payer: Medicare Other | Attending: General Surgery | Admitting: General Surgery

## 2019-04-30 ENCOUNTER — Inpatient Hospital Stay (HOSPITAL_COMMUNITY): Payer: Medicare Other | Admitting: Anesthesiology

## 2019-04-30 ENCOUNTER — Encounter (HOSPITAL_COMMUNITY)
Admission: RE | Disposition: A | Discharge status: Home / Self Care | Payer: Self-pay | Source: Home / Self Care | Attending: Surgery

## 2019-04-30 ENCOUNTER — Inpatient Hospital Stay (HOSPITAL_COMMUNITY): Payer: Medicare Other | Admitting: Physician Assistant

## 2019-04-30 ENCOUNTER — Other Ambulatory Visit: Payer: Self-pay

## 2019-04-30 ENCOUNTER — Encounter (HOSPITAL_COMMUNITY): Payer: Self-pay

## 2019-04-30 DIAGNOSIS — Z91013 Allergy to seafood: Secondary | ICD-10-CM | POA: Diagnosis not present

## 2019-04-30 DIAGNOSIS — Z79899 Other long term (current) drug therapy: Secondary | ICD-10-CM | POA: Diagnosis not present

## 2019-04-30 DIAGNOSIS — Z96641 Presence of right artificial hip joint: Secondary | ICD-10-CM | POA: Diagnosis present

## 2019-04-30 DIAGNOSIS — Z974 Presence of external hearing-aid: Secondary | ICD-10-CM | POA: Diagnosis not present

## 2019-04-30 DIAGNOSIS — K660 Peritoneal adhesions (postprocedural) (postinfection): Secondary | ICD-10-CM | POA: Diagnosis not present

## 2019-04-30 DIAGNOSIS — Z820 Family history of epilepsy and other diseases of the nervous system: Secondary | ICD-10-CM | POA: Diagnosis not present

## 2019-04-30 DIAGNOSIS — Z8042 Family history of malignant neoplasm of prostate: Secondary | ICD-10-CM

## 2019-04-30 DIAGNOSIS — G4733 Obstructive sleep apnea (adult) (pediatric): Secondary | ICD-10-CM | POA: Diagnosis present

## 2019-04-30 DIAGNOSIS — Z8249 Family history of ischemic heart disease and other diseases of the circulatory system: Secondary | ICD-10-CM

## 2019-04-30 DIAGNOSIS — Z8719 Personal history of other diseases of the digestive system: Secondary | ICD-10-CM

## 2019-04-30 DIAGNOSIS — Z90710 Acquired absence of both cervix and uterus: Secondary | ICD-10-CM

## 2019-04-30 DIAGNOSIS — Z85828 Personal history of other malignant neoplasm of skin: Secondary | ICD-10-CM | POA: Diagnosis not present

## 2019-04-30 DIAGNOSIS — K635 Polyp of colon: Principal | ICD-10-CM | POA: Diagnosis present

## 2019-04-30 DIAGNOSIS — Z8371 Family history of colonic polyps: Secondary | ICD-10-CM

## 2019-04-30 DIAGNOSIS — D126 Benign neoplasm of colon, unspecified: Secondary | ICD-10-CM | POA: Diagnosis not present

## 2019-04-30 DIAGNOSIS — K648 Other hemorrhoids: Secondary | ICD-10-CM | POA: Diagnosis present

## 2019-04-30 DIAGNOSIS — Z8261 Family history of arthritis: Secondary | ICD-10-CM | POA: Diagnosis not present

## 2019-04-30 DIAGNOSIS — I272 Pulmonary hypertension, unspecified: Secondary | ICD-10-CM | POA: Diagnosis not present

## 2019-04-30 DIAGNOSIS — D122 Benign neoplasm of ascending colon: Secondary | ICD-10-CM | POA: Diagnosis not present

## 2019-04-30 DIAGNOSIS — Z803 Family history of malignant neoplasm of breast: Secondary | ICD-10-CM | POA: Diagnosis not present

## 2019-04-30 DIAGNOSIS — Z881 Allergy status to other antibiotic agents status: Secondary | ICD-10-CM | POA: Diagnosis not present

## 2019-04-30 DIAGNOSIS — Z20828 Contact with and (suspected) exposure to other viral communicable diseases: Secondary | ICD-10-CM | POA: Diagnosis not present

## 2019-04-30 DIAGNOSIS — M8580 Other specified disorders of bone density and structure, unspecified site: Secondary | ICD-10-CM | POA: Diagnosis not present

## 2019-04-30 DIAGNOSIS — D696 Thrombocytopenia, unspecified: Secondary | ICD-10-CM | POA: Diagnosis not present

## 2019-04-30 DIAGNOSIS — Z833 Family history of diabetes mellitus: Secondary | ICD-10-CM

## 2019-04-30 DIAGNOSIS — M1990 Unspecified osteoarthritis, unspecified site: Secondary | ICD-10-CM | POA: Diagnosis not present

## 2019-04-30 DIAGNOSIS — Z825 Family history of asthma and other chronic lower respiratory diseases: Secondary | ICD-10-CM

## 2019-04-30 DIAGNOSIS — I739 Peripheral vascular disease, unspecified: Secondary | ICD-10-CM | POA: Diagnosis not present

## 2019-04-30 HISTORY — PX: LAPAROSCOPIC RIGHT HEMI COLECTOMY: SHX5926

## 2019-04-30 LAB — TYPE AND SCREEN
ABO/RH(D): B POS
Antibody Screen: NEGATIVE

## 2019-04-30 SURGERY — LAPAROSCOPIC RIGHT HEMI COLECTOMY
Anesthesia: General | Site: Abdomen | Laterality: Right

## 2019-04-30 MED ORDER — HEPARIN SODIUM (PORCINE) 5000 UNIT/ML IJ SOLN
5000.0000 [IU] | Freq: Three times a day (TID) | INTRAMUSCULAR | Status: DC
  Administered 2019-05-01 – 2019-05-02 (×4): 5000 [IU] via SUBCUTANEOUS
  Filled 2019-04-30 (×4): qty 1

## 2019-04-30 MED ORDER — ROCURONIUM BROMIDE 10 MG/ML (PF) SYRINGE
PREFILLED_SYRINGE | INTRAVENOUS | Status: AC
  Filled 2019-04-30: qty 10

## 2019-04-30 MED ORDER — FLUTICASONE PROPIONATE 50 MCG/ACT NA SUSP
2.0000 | Freq: Every day | NASAL | Status: DC
  Administered 2019-04-30 – 2019-05-02 (×3): 2 via NASAL
  Filled 2019-04-30: qty 16

## 2019-04-30 MED ORDER — HYDROMORPHONE HCL 1 MG/ML IJ SOLN
INTRAMUSCULAR | Status: AC
  Filled 2019-04-30: qty 1

## 2019-04-30 MED ORDER — LACTATED RINGERS IV SOLN
INTRAVENOUS | Status: DC
  Administered 2019-05-01: 06:00:00 via INTRAVENOUS

## 2019-04-30 MED ORDER — BRINZOLAMIDE 1 % OP SUSP
1.0000 [drp] | Freq: Two times a day (BID) | OPHTHALMIC | Status: DC
  Filled 2019-04-30: qty 10

## 2019-04-30 MED ORDER — GABAPENTIN 300 MG PO CAPS
300.0000 mg | ORAL_CAPSULE | ORAL | Status: AC
  Administered 2019-04-30: 300 mg via ORAL
  Filled 2019-04-30: qty 1

## 2019-04-30 MED ORDER — SODIUM CHLORIDE 0.9 % IV SOLN
2.0000 g | INTRAVENOUS | Status: AC
  Administered 2019-04-30: 2 g via INTRAVENOUS
  Filled 2019-04-30: qty 2

## 2019-04-30 MED ORDER — DIPHENHYDRAMINE HCL 50 MG/ML IJ SOLN: 12.5000 mg | Freq: Four times a day (QID) | INTRAMUSCULAR | Status: DC | PRN

## 2019-04-30 MED ORDER — EPHEDRINE SULFATE-NACL 50-0.9 MG/10ML-% IV SOSY
PREFILLED_SYRINGE | INTRAVENOUS | Status: DC | PRN
  Administered 2019-04-30 (×2): 10 mg via INTRAVENOUS

## 2019-04-30 MED ORDER — BUPIVACAINE-EPINEPHRINE 0.25% -1:200000 IJ SOLN
INTRAMUSCULAR | Status: DC | PRN
  Administered 2019-04-30: 30 mL

## 2019-04-30 MED ORDER — ONDANSETRON HCL 4 MG/2ML IJ SOLN: 4.0000 mg | Freq: Four times a day (QID) | INTRAMUSCULAR | Status: DC | PRN

## 2019-04-30 MED ORDER — ACETAMINOPHEN 500 MG PO TABS
1000.0000 mg | ORAL_TABLET | Freq: Four times a day (QID) | ORAL | Status: DC
  Administered 2019-04-30 – 2019-05-02 (×8): 1000 mg via ORAL
  Filled 2019-04-30 (×8): qty 2

## 2019-04-30 MED ORDER — EPHEDRINE 5 MG/ML INJ
INTRAVENOUS | Status: AC
  Filled 2019-04-30: qty 10

## 2019-04-30 MED ORDER — FENTANYL CITRATE (PF) 100 MCG/2ML IJ SOLN
INTRAMUSCULAR | Status: DC | PRN
  Administered 2019-04-30: 100 ug via INTRAVENOUS
  Administered 2019-04-30 (×3): 50 ug via INTRAVENOUS

## 2019-04-30 MED ORDER — DIPHENHYDRAMINE HCL 12.5 MG/5ML PO ELIX: 12.5000 mg | ORAL_SOLUTION | Freq: Four times a day (QID) | ORAL | Status: DC | PRN

## 2019-04-30 MED ORDER — DEXAMETHASONE SODIUM PHOSPHATE 10 MG/ML IJ SOLN
INTRAMUSCULAR | Status: AC
  Filled 2019-04-30: qty 1

## 2019-04-30 MED ORDER — BUPIVACAINE-EPINEPHRINE 0.5% -1:200000 IJ SOLN
INTRAMUSCULAR | Status: AC
  Filled 2019-04-30: qty 1

## 2019-04-30 MED ORDER — TRAMADOL HCL 50 MG PO TABS
50.0000 mg | ORAL_TABLET | Freq: Four times a day (QID) | ORAL | Status: DC | PRN
  Administered 2019-04-30 – 2019-05-01 (×4): 50 mg via ORAL
  Filled 2019-04-30 (×4): qty 1

## 2019-04-30 MED ORDER — CALCIUM-MAGNESIUM 500-250 MG PO TABS: 1.0000 | ORAL_TABLET | Freq: Every day | ORAL | Status: DC

## 2019-04-30 MED ORDER — CALCIUM CARBONATE 1250 (500 CA) MG PO TABS
1.0000 | ORAL_TABLET | Freq: Every day | ORAL | Status: DC
  Administered 2019-05-01 – 2019-05-02 (×2): 500 mg via ORAL
  Filled 2019-04-30 (×2): qty 1

## 2019-04-30 MED ORDER — MEPERIDINE HCL 50 MG/ML IJ SOLN: 6.2500 mg | INTRAMUSCULAR | Status: DC | PRN

## 2019-04-30 MED ORDER — ALVIMOPAN 12 MG PO CAPS
12.0000 mg | ORAL_CAPSULE | ORAL | Status: AC
  Administered 2019-04-30: 12 mg via ORAL
  Filled 2019-04-30: qty 1

## 2019-04-30 MED ORDER — ROCURONIUM BROMIDE 10 MG/ML (PF) SYRINGE
PREFILLED_SYRINGE | INTRAVENOUS | Status: DC | PRN
  Administered 2019-04-30: 50 mg via INTRAVENOUS

## 2019-04-30 MED ORDER — DEXAMETHASONE SODIUM PHOSPHATE 10 MG/ML IJ SOLN
INTRAMUSCULAR | Status: DC | PRN
  Administered 2019-04-30: 8 mg via INTRAVENOUS

## 2019-04-30 MED ORDER — LACTATED RINGERS IV SOLN
INTRAVENOUS | Status: DC
  Administered 2019-04-30 (×2): via INTRAVENOUS

## 2019-04-30 MED ORDER — LIDOCAINE 20MG/ML (2%) 15 ML SYRINGE OPTIME
INTRAMUSCULAR | Status: DC | PRN
  Administered 2019-04-30: 1.5 mg/kg/h via INTRAVENOUS

## 2019-04-30 MED ORDER — HEPARIN SODIUM (PORCINE) 5000 UNIT/ML IJ SOLN
5000.0000 [IU] | Freq: Once | INTRAMUSCULAR | Status: AC
  Administered 2019-04-30: 07:00:00 5000 [IU] via SUBCUTANEOUS
  Filled 2019-04-30: qty 1

## 2019-04-30 MED ORDER — BUPIVACAINE LIPOSOME 1.3 % IJ SUSP
INTRAMUSCULAR | Status: DC | PRN
  Administered 2019-04-30: 20 mL

## 2019-04-30 MED ORDER — HYDROMORPHONE HCL 1 MG/ML IJ SOLN
0.5000 mg | INTRAMUSCULAR | Status: DC | PRN
  Administered 2019-04-30 – 2019-05-01 (×2): 0.5 mg via INTRAVENOUS
  Filled 2019-04-30: qty 0.5

## 2019-04-30 MED ORDER — LACTATED RINGERS IV SOLN
INTRAVENOUS | Status: AC | PRN
  Administered 2019-04-30: 1000 mL

## 2019-04-30 MED ORDER — CHLORHEXIDINE GLUCONATE CLOTH 2 % EX PADS: 6.0000 | MEDICATED_PAD | Freq: Once | CUTANEOUS | Status: DC

## 2019-04-30 MED ORDER — KETAMINE HCL 10 MG/ML IJ SOLN
INTRAMUSCULAR | Status: AC
  Filled 2019-04-30: qty 1

## 2019-04-30 MED ORDER — VITAMIN D 25 MCG (1000 UNIT) PO TABS
2000.0000 [IU] | ORAL_TABLET | Freq: Every day | ORAL | Status: DC
  Administered 2019-05-01 – 2019-05-02 (×2): 2000 [IU] via ORAL
  Filled 2019-04-30 (×2): qty 2

## 2019-04-30 MED ORDER — DULOXETINE HCL 60 MG PO CPEP
60.0000 mg | ORAL_CAPSULE | Freq: Every day | ORAL | Status: DC
  Administered 2019-05-01 – 2019-05-02 (×2): 60 mg via ORAL
  Filled 2019-04-30 (×2): qty 1

## 2019-04-30 MED ORDER — PREGABALIN 25 MG PO CAPS
25.0000 mg | ORAL_CAPSULE | Freq: Two times a day (BID) | ORAL | Status: DC
  Administered 2019-04-30 – 2019-05-02 (×4): 25 mg via ORAL
  Filled 2019-04-30 (×4): qty 1

## 2019-04-30 MED ORDER — BRIMONIDINE TARTRATE 0.2 % OP SOLN
1.0000 [drp] | Freq: Two times a day (BID) | OPHTHALMIC | Status: DC
  Filled 2019-04-30: qty 5

## 2019-04-30 MED ORDER — POLYVINYL ALCOHOL 1.4 % OP SOLN
1.0000 [drp] | Freq: Three times a day (TID) | OPHTHALMIC | Status: DC | PRN
  Filled 2019-04-30: qty 15

## 2019-04-30 MED ORDER — LIDOCAINE 2% (20 MG/ML) 5 ML SYRINGE
INTRAMUSCULAR | Status: AC
  Filled 2019-04-30: qty 5

## 2019-04-30 MED ORDER — ONDANSETRON HCL 4 MG PO TABS: 4.0000 mg | ORAL_TABLET | Freq: Four times a day (QID) | ORAL | Status: DC | PRN

## 2019-04-30 MED ORDER — ENSURE SURGERY PO LIQD
237.0000 mL | Freq: Two times a day (BID) | ORAL | Status: DC
  Administered 2019-05-02: 237 mL via ORAL
  Filled 2019-04-30 (×5): qty 237

## 2019-04-30 MED ORDER — PROPOFOL 10 MG/ML IV BOLUS
INTRAVENOUS | Status: AC
  Filled 2019-04-30: qty 20

## 2019-04-30 MED ORDER — LIDOCAINE 2% (20 MG/ML) 5 ML SYRINGE
INTRAMUSCULAR | Status: DC | PRN
  Administered 2019-04-30: 50 mg via INTRAVENOUS

## 2019-04-30 MED ORDER — HYDROMORPHONE HCL 1 MG/ML IJ SOLN: 0.2500 mg | INTRAMUSCULAR | Status: DC | PRN

## 2019-04-30 MED ORDER — ONDANSETRON HCL 4 MG/2ML IJ SOLN
INTRAMUSCULAR | Status: AC
  Filled 2019-04-30: qty 2

## 2019-04-30 MED ORDER — ACETAMINOPHEN 500 MG PO TABS
1000.0000 mg | ORAL_TABLET | ORAL | Status: AC
  Administered 2019-04-30: 1000 mg via ORAL
  Filled 2019-04-30: qty 2

## 2019-04-30 MED ORDER — CARBOXYMETHYLCELLULOSE SODIUM 0.5 % OP SOLN: 1.0000 [drp] | Freq: Three times a day (TID) | OPHTHALMIC | Status: DC | PRN

## 2019-04-30 MED ORDER — BRINZOLAMIDE-BRIMONIDINE 1-0.2 % OP SUSP
1.0000 [drp] | Freq: Two times a day (BID) | OPHTHALMIC | Status: DC
  Administered 2019-05-01 – 2019-05-02 (×2): 1 [drp] via OPHTHALMIC

## 2019-04-30 MED ORDER — HYDRALAZINE HCL 20 MG/ML IJ SOLN: 10.0000 mg | INTRAMUSCULAR | Status: DC | PRN

## 2019-04-30 MED ORDER — KETAMINE HCL 10 MG/ML IJ SOLN
INTRAMUSCULAR | Status: DC | PRN
  Administered 2019-04-30: 30 mg via INTRAVENOUS

## 2019-04-30 MED ORDER — TIZANIDINE HCL 4 MG PO TABS
2.0000 mg | ORAL_TABLET | Freq: Every day | ORAL | Status: DC
  Administered 2019-04-30 – 2019-05-01 (×2): 2 mg via ORAL
  Filled 2019-04-30 (×2): qty 1

## 2019-04-30 MED ORDER — LIDOCAINE HCL 1 % IJ SOLN
INTRAMUSCULAR | Status: AC
  Filled 2019-04-30: qty 20

## 2019-04-30 MED ORDER — VITAMIN D3 50 MCG (2000 UT) PO TABS: 2000.0000 [IU] | ORAL_TABLET | Freq: Every evening | ORAL | Status: DC

## 2019-04-30 MED ORDER — FENTANYL CITRATE (PF) 250 MCG/5ML IJ SOLN
INTRAMUSCULAR | Status: AC
  Filled 2019-04-30: qty 5

## 2019-04-30 MED ORDER — CYCLOSPORINE 0.05 % OP EMUL
1.0000 [drp] | Freq: Two times a day (BID) | OPHTHALMIC | Status: DC
  Administered 2019-04-30 – 2019-05-02 (×3): 1 [drp] via OPHTHALMIC
  Filled 2019-04-30 (×2): qty 30

## 2019-04-30 MED ORDER — BUPIVACAINE-EPINEPHRINE (PF) 0.25% -1:200000 IJ SOLN
INTRAMUSCULAR | Status: AC
  Filled 2019-04-30: qty 30

## 2019-04-30 MED ORDER — ONDANSETRON HCL 4 MG/2ML IJ SOLN
INTRAMUSCULAR | Status: DC | PRN
  Administered 2019-04-30: 4 mg via INTRAVENOUS

## 2019-04-30 MED ORDER — SODIUM CHLORIDE 0.9 % IR SOLN
Status: DC | PRN
  Administered 2019-04-30: 2000 mL

## 2019-04-30 MED ORDER — SODIUM CHLORIDE (PF) 0.9 % IJ SOLN
INTRAMUSCULAR | Status: AC
  Filled 2019-04-30: qty 20

## 2019-04-30 MED ORDER — IBUPROFEN 200 MG PO TABS
600.0000 mg | ORAL_TABLET | Freq: Four times a day (QID) | ORAL | Status: DC | PRN
  Filled 2019-04-30: qty 3

## 2019-04-30 MED ORDER — SUCCINYLCHOLINE CHLORIDE 200 MG/10ML IV SOSY
PREFILLED_SYRINGE | INTRAVENOUS | Status: DC | PRN
  Administered 2019-04-30: 160 mg via INTRAVENOUS

## 2019-04-30 MED ORDER — METOCLOPRAMIDE HCL 5 MG/ML IJ SOLN: 10.0000 mg | Freq: Once | INTRAMUSCULAR | Status: DC | PRN

## 2019-04-30 MED ORDER — ALUM & MAG HYDROXIDE-SIMETH 200-200-20 MG/5ML PO SUSP: 30.0000 mL | Freq: Four times a day (QID) | ORAL | Status: DC | PRN

## 2019-04-30 MED ORDER — MAGNESIUM OXIDE 400 (241.3 MG) MG PO TABS
200.0000 mg | ORAL_TABLET | Freq: Every day | ORAL | Status: DC
  Administered 2019-05-01 – 2019-05-02 (×2): 200 mg via ORAL
  Filled 2019-04-30 (×2): qty 1

## 2019-04-30 MED ORDER — OLOPATADINE HCL 0.1 % OP SOLN
1.0000 [drp] | Freq: Two times a day (BID) | OPHTHALMIC | Status: DC | PRN
  Filled 2019-04-30: qty 5

## 2019-04-30 SURGICAL SUPPLY — 58 items
APL PRP STRL LF DISP 70% ISPRP (MISCELLANEOUS)
APPLIER CLIP ROT 10 11.4 M/L (STAPLE)
APR CLP MED LRG 11.4X10 (STAPLE)
BLADE CLIPPER SURG (BLADE) ×1 IMPLANT
CABLE HIGH FREQUENCY MONO STRZ (ELECTRODE) ×2 IMPLANT
CELLS DAT CNTRL 66122 CELL SVR (MISCELLANEOUS) IMPLANT
CHLORAPREP W/TINT 26 (MISCELLANEOUS) ×1 IMPLANT
CLIP APPLIE ROT 10 11.4 M/L (STAPLE) IMPLANT
COVER WAND RF STERILE (DRAPES) ×1 IMPLANT
DECANTER SPIKE VIAL GLASS SM (MISCELLANEOUS) ×2 IMPLANT
DISSECTOR BLUNT TIP ENDO 5MM (MISCELLANEOUS) IMPLANT
DRSG OPSITE POSTOP 4X6 (GAUZE/BANDAGES/DRESSINGS) ×1 IMPLANT
ELECT REM PT RETURN 15FT ADLT (MISCELLANEOUS) ×2 IMPLANT
GAUZE SPONGE 4X4 12PLY STRL (GAUZE/BANDAGES/DRESSINGS) ×2 IMPLANT
GLOVE BIO SURGEON STRL SZ7.5 (GLOVE) ×4 IMPLANT
GLOVE ECLIPSE 8.0 STRL XLNG CF (GLOVE) ×4 IMPLANT
GOWN STRL REUS W/TWL XL LVL3 (GOWN DISPOSABLE) ×8 IMPLANT
KIT TURNOVER KIT A (KITS) IMPLANT
LIGASURE IMPACT 36 18CM CVD LR (INSTRUMENTS) IMPLANT
NS IRRIG 1000ML POUR BTL (IV SOLUTION) ×3 IMPLANT
PACK COLON (CUSTOM PROCEDURE TRAY) ×2 IMPLANT
PAD POSITIONING PINK XL (MISCELLANEOUS) ×1 IMPLANT
PROTECTOR NERVE ULNAR (MISCELLANEOUS) IMPLANT
RELOAD PROXIMATE 75MM BLUE (ENDOMECHANICALS) ×2 IMPLANT
RELOAD STAPLE 75 3.8 BLU REG (ENDOMECHANICALS) IMPLANT
RETRACTOR WND ALEXIS 18 MED (MISCELLANEOUS) IMPLANT
RTRCTR WOUND ALEXIS 18CM MED (MISCELLANEOUS)
SCISSORS LAP 5X35 DISP (ENDOMECHANICALS) ×2 IMPLANT
SEALER TISSUE G2 STRG ARTC 35C (ENDOMECHANICALS) ×1 IMPLANT
SET IRRIG TUBING LAPAROSCOPIC (IRRIGATION / IRRIGATOR) ×1 IMPLANT
SET TUBE SMOKE EVAC HIGH FLOW (TUBING) ×2 IMPLANT
SHEARS HARMONIC ACE PLUS 36CM (ENDOMECHANICALS) IMPLANT
SLEEVE ADV FIXATION 5X100MM (TROCAR) ×4 IMPLANT
SLEEVE ENDOPATH XCEL 5M (ENDOMECHANICALS) ×2 IMPLANT
STAPLER GUN LINEAR PROX 60 (STAPLE) ×1 IMPLANT
STAPLER PROXIMATE 75MM BLUE (STAPLE) ×2 IMPLANT
STAPLER VISISTAT 35W (STAPLE) ×2 IMPLANT
SUT MNCRL AB 4-0 PS2 18 (SUTURE) ×2 IMPLANT
SUT PDS AB 1 CT1 27 (SUTURE) ×4 IMPLANT
SUT PROLENE 2 0 CT2 30 (SUTURE) IMPLANT
SUT PROLENE 2 0 KS (SUTURE) IMPLANT
SUT SILK 2 0 (SUTURE) ×2
SUT SILK 2 0 SH CR/8 (SUTURE) ×2 IMPLANT
SUT SILK 2-0 18XBRD TIE 12 (SUTURE) ×1 IMPLANT
SUT SILK 3 0 (SUTURE) ×2
SUT SILK 3 0 SH CR/8 (SUTURE) ×2 IMPLANT
SUT SILK 3-0 18XBRD TIE 12 (SUTURE) ×1 IMPLANT
SYS LAPSCP GELPORT 120MM (MISCELLANEOUS) ×2
SYSTEM LAPSCP GELPORT 120MM (MISCELLANEOUS) IMPLANT
TAPE CLOTH 4X10 WHT NS (GAUZE/BANDAGES/DRESSINGS) IMPLANT
TOWEL OR 17X26 10 PK STRL BLUE (TOWEL DISPOSABLE) ×2 IMPLANT
TRAY FOLEY MTR SLVR 14FR STAT (SET/KITS/TRAYS/PACK) ×2 IMPLANT
TRAY FOLEY MTR SLVR 16FR STAT (SET/KITS/TRAYS/PACK) IMPLANT
TROCAR ADV FIXATION 5X100MM (TROCAR) ×2 IMPLANT
TROCAR BALLN 12MMX100 BLUNT (TROCAR) ×2 IMPLANT
TROCAR XCEL NON-BLD 11X100MML (ENDOMECHANICALS) IMPLANT
TUBING CONNECTING 10 (TUBING) ×4 IMPLANT
YANKAUER SUCT BULB TIP NO VENT (SUCTIONS) ×2 IMPLANT

## 2019-04-30 NOTE — H&P (Signed)
CC: Referral by Dr. Hilarie Fredrickson for endoscopically unresectable adenomatous tissue around the ileocecal valve  HPI: Tanya Leon is a very pleasant 78yoF with hx of arthritis, ?OSA, mild pulmonary HTN whom presents as a referral from Dr. Hilarie Fredrickson for endoscopically unresectable/ill-defined adenomatous tissue about the ileocecal valve. She underwent chromo-endoscopy with Dr. Hilarie Fredrickson 11/13/2022 history of colon polyps. She is found with chromo-endoscopy to have post polypectomy scarring in the cecum, 2 sessile polyps in the cecum which were removed; multiple biopsies and a target management of the ileocecal valve to exclude adenoma. The mucosal and distal side ileocecal valve did not take abstaining readily and the tissue did not appear overtly adenomatous but biopsies were taken. These biopsies returned as tubular adenoma. The margins of where this tissue began and it was not clear on endoscopy and therefore additional attempts at removal were deemed not to be feasible by Dr. Hilarie Fredrickson. She was therefore referred to [place] for further evaluation. She also had multiple small bowel diverticula sigmoid, descending, hepatic flexure, ascending colon. 4 mm polyp was removed from the sigmoid but the tissue was not retrieved. Normal-appearing colocolonic anastomosis was also found. Internal hemorrhoids were seen.  Her prior colonoscopy was done 07/2017 which demonstrated a 15-20 mm polyp in the cecum that was flat and removed in a piecemeal manner. 2 other diminutive polyps were removed. All polypoid tissue returned as tubular adenoma without dysplasia.  PMH: ARthritis (managed with prn NSAIDs, denies steroid use); possible OSA (workup underway with Dr. Gwenlyn Found - cardiology); also seeing for mild pulmonary HTN  PSH: Exploratory laparotomy with TAH/BSO and end colostomy - 2007 - found to have perforated Diverticulitis at That Time (Dr. Zettie Pho) Laparoscopic colostomy takedown 4-61mo later (Dr. Zettie Pho) Repair of low midline  incisional hernia with mesh, Dr. Dalbert Batman 2012  FHx: She reports her mother had breast cancer at advanced age. She reports her brother had prostate cancer.  Social: Denies use of tobacco/drugs; daily glass of wine; retired  ROS: A comprehensive 10 system review of systems was completed with the patient and pertinent findings as noted above.  Past Medical History:  Diagnosis Date  . Allergy 1973   Seasonal  . Arthritis   . Blood transfusion without reported diagnosis 08/15/2008   Following hip replacement  . Cancer (HCC)    hx of skin cancer   . Cataract 1998   Cataract surgery both eyes  . Colon polyps    adenomatous  . Complication of anesthesia    " pt stated they gave me too much - kept having to be reminded to breathe   . Constipation    opiod induced associated with back pain  . Depression    hx of   . Diverticulosis   . DIVERTICULOSIS, COLON 01/13/2009  . Glaucoma 05-12-14  . Headache    history of migraines  . Heart murmur    benign, dagnosed around age 38,prophylactic antibiotic  . Low back pain   . Lumbar spondylolysis   . Osteopenia   . Pneumonia    hx of as a baby   . Pulmonary hypertension ([place])    monitored by cards Dr Gwenlyn Found annually via echo   . PVC's (premature ventricular contractions)    i have them periodically , Dr Gwenlyn Found had me cut back on caffeine and that helped   . Rectocele   . Tubular adenoma of colon   . Wears hearing aid in both ears     Past Surgical History:  Procedure Laterality Date  . ABDOMINAL  HERNIA REPAIR Right    incisional  . ABDOMINAL HYSTERECTOMY     with ovaries  . BACK SURGERY  2009   L4-L5 with rods  . BIOPSY  11/13/2018   Procedure: BIOPSY;  Surgeon: Jerene Bears, MD;  Location: [facility];  Service: Gastroenterology;;  . BLADDER SUSPENSION N/A 04/09/2017   Procedure: TRANSVAGINAL TAPE (TVT) PROCEDURE;  Surgeon: Bobbye Charleston, MD;  Location: [facility];  Service: Gynecology;  Laterality: N/A;  . CATARACT EXTRACTION,  BILATERAL    . cataract left lens replacement     2016  . COLON SURGERY  2007   Rpr abscessed ruptured diverticuli  . COLONOSCOPY    . COLONOSCOPY N/A 08/29/2017   Procedure: COLONOSCOPY;  Surgeon: Jerene Bears, MD;  Location: [facility];  Service: Gastroenterology;  Laterality: N/A;  . COLONOSCOPY WITH PROPOFOL N/A 11/13/2018   Procedure: COLONOSCOPY WITH PROPOFOL with Methylane Blue Injection;  Surgeon: Jerene Bears, MD;  Location: [facility];  Service: Gastroenterology;  Laterality: N/A;  . CYSTOCELE REPAIR N/A 04/09/2017   Procedure: ANTERIOR REPAIR (CYSTOCELE);  Surgeon: Bobbye Charleston, MD;  Location: [facility];  Service: Gynecology;  Laterality: N/A;  . CYSTOSCOPY N/A 04/09/2017   Procedure: CYSTOSCOPY;  Surgeon: Bobbye Charleston, MD;  Location: [facility];  Service: Gynecology;  Laterality: N/A;  . EYE MUSCLE SURGERY    . FRACTURE SURGERY  11-13-17   L wrist-open reduction internal fixation  . GLAUCOMA SURGERY Left 05/12/14  . HERNIA REPAIR  05-28-11   Hernia result of 2007 colon surg  . incisional ab  05/28/11  . JOINT REPLACEMENT     right hip replacement   . LUMBAR FUSION     L4-L5  . PARTIAL KNEE ARTHROPLASTY Right   . PARTIAL KNEE ARTHROPLASTY Left   . perforated deverticular abscess/laporotomy and colostomy     depression after this  . POLYPECTOMY N/A 08/29/2017   Procedure: POLYPECTOMY;  Surgeon: Jerene Bears, MD;  Location: [facility];  Service: Gastroenterology;  Laterality: N/A;  . POLYPECTOMY  11/13/2018   Procedure: POLYPECTOMY;  Surgeon: Jerene Bears, MD;  Location: [facility];  Service: Gastroenterology;;  . SPINE SURGERY  02-16-08   Fusion L4L5  . stribismus eye surgery Right   . take down of colostomy    . tonsillectomly    . TONSILLECTOMY    . TOTAL HIP ARTHROPLASTY Right   . TOTAL KNEE REVISION Left 11/24/2015   Procedure: Conversion from left lateral unicompartmental knee arthroplasty to left total knee arthroplasty;  Surgeon: Mcarthur Rossetti,  MD;  Location: [facility];  Service: Orthopedics;  Laterality: Left;  . TUBAL LIGATION  1976    Family History  Problem Relation Age of Onset  . Alzheimer's disease Father   . Heart disease Father        mitral valve replaced. unknown reason  . Breast cancer Mother        76  . Colon polyps Mother   . Diabetes Mother   . Heart disease Mother 67       CHF, MI 59  . Heart attack Mother        x 2  . Arthritis Mother   . Cancer Mother   . Hearing loss Mother   . Hypertension Mother   . Prostate cancer Brother   . Cancer Brother   . Hearing loss Brother   . Hypertension Brother   . Asthma Paternal Uncle   . Hypertension Sister   . Colon cancer Neg Hx   .  Esophageal cancer Neg Hx   . Rectal cancer Neg Hx   . Stomach cancer Neg Hx     Social:  reports that she has never smoked. She has never used smokeless tobacco. She reports current alcohol use of about 7.0 standard drinks of alcohol per week. She reports that she does not use drugs.  Allergies:  Allergies  Allergen Reactions  . Crab [Shellfish Allergy] Swelling    Soft shelled crab  . Doxycycline Rash    Medications: I have reviewed the patient's current medications.  No results found for this or any previous visit (from the past 48 hour(s)).  No results found.  ROS - all of the below systems have been reviewed with the patient and positives are indicated with bold text General: chills, fever or night sweats Eyes: blurry vision or double vision ENT: epistaxis or sore throat Allergy/Immunology: itchy/watery eyes or nasal congestion Hematologic/Lymphatic: bleeding problems, blood clots or swollen lymph nodes Endocrine: temperature intolerance or unexpected weight changes Breast: new or changing breast lumps or nipple discharge Resp: cough, shortness of breath, or wheezing CV: chest pain or dyspnea on exertion GI: as per HPI GU: dysuria, trouble voiding, or hematuria MSK: joint pain or joint stiffness Neuro: TIA or  stroke symptoms Derm: pruritus and skin lesion changes Psych: anxiety and depression  PE Blood pressure (!) 145/79, pulse 64, temperature 97.8 F (36.6 C), temperature source Oral, resp. rate 18, height 5\' 2"  (1.575 m), weight 62.1 kg, SpO2 98 %. Constitutional: NAD; conversant; no deformities Eyes: Moist conjunctiva; no lid lag; anicteric; PERRL Neck: Trachea midline; no thyromegaly Lungs: Normal respiratory effort; no tactile fremitus CV: RRR; no palpable thrills; no pitting edema GI: Abd soft, NT/ND; no palpable hepatosplenomegaly MSK: Normal gait; no clubbing/cyanosis Psychiatric: Appropriate affect; alert and oriented x3 Lymphatic: No palpable cervical or axillary lymphadenopathy  No results found for this or any previous visit (from the past 48 hour(s)).  No results found.  A/P: Ms. Lindstrom is a very pleasant 68yoF with hx of arthritis, ?OSA, ?pulmonary HTN (Dr. Gwenlyn Found) - here today for surgery for endoscopically unresectable/ill-defined margin tubular adenomatous tissue at the ileocecal valve on her most recent colonoscopy. This is at the site of a prior 15-20 mm polyp which was removed 07/2017. Due to the margins being now ill-defined and unclear as to where the adenomatous tissue starts and finishes, she was referred to me for consideration of right hemicolectomy   -The anatomy and physiology of the GI tract has been reviewed at length with the patient. The pathophysiology of colon polyps and cancer was discussed as well. -We have reviewed options of going forward. We discussed ongoing observation with the risk of malignant degeneration. We also discussed the possibility of dysplasia/cancer being present but not found on biopsy although the areas on colonoscopy did not appear particularly worrisome for malignancy -We discussed right hemicolectomy-laparoscopic and potential open techniques. -Cardiac clearance obtained, Dr.Berry -The procedure, material risks (including, but not  limited to, pain, bleeding, infection, scarring, need for blood transfusion, damage to surrounding structures- blood vessels/nerves/viscus/organs - particularly given her surgical history, damage to ureter, urine leak, leak from anastomosis, need for additional procedures, need for stoma which may be permanent, hernia, recurrence, DVT/PE, pneumonia, heart attack, stroke, death) benefits and alternatives to surgery were discussed at length. The patient's questions were again answered to her satisfaction today, she voiced understanding and elected to proceed with surgery. Additionally, we discussed typical postoperative expectations and the recovery process.  Sharon Mt. Dema Severin, M.D.  General and Colorectal Surgery [facility]`

## 2019-04-30 NOTE — Anesthesia Postprocedure Evaluation (Signed)
Anesthesia Post Note  Patient: Tanya Leon  Procedure(s) Performed: LAPAROSCOPIC  RIGHT HEMI COLECTOMY (Right Abdomen)     Patient location during evaluation: PACU Anesthesia Type: General Level of consciousness: awake and alert and oriented Pain management: pain level controlled Vital Signs Assessment: post-procedure vital signs reviewed and stable Respiratory status: spontaneous breathing, nonlabored ventilation and respiratory function stable Cardiovascular status: blood pressure returned to baseline and stable Postop Assessment: no apparent nausea or vomiting Anesthetic complications: no    Last Vitals:  Vitals:   04/30/19 0545 04/30/19 0952  BP: (!) 145/79 132/77  Pulse: 64 81  Resp: 18 17  Temp: 36.6 C 36.4 C  SpO2: 98% 92%    Last Pain:  Vitals:   04/30/19 1045  TempSrc:   PainSc: Asleep                 Rylan Kaufmann A.

## 2019-04-30 NOTE — Progress Notes (Signed)
Pt. Will uses eye medications at bedside. Restasis and Brinzolamide-Brimonidine. Medications have been verified by Pharmacist. Proper paperwork is at bedside. RN's to dispense home meds. At bedside.

## 2019-04-30 NOTE — Anesthesia Procedure Notes (Signed)
Procedure Name: Intubation Date/Time: 04/30/2019 7:41 AM Performed by: Anne Fu, CRNA Pre-anesthesia Checklist: Patient identified, Emergency Drugs available, Suction available, Patient being monitored and Timeout performed Patient Re-evaluated:Patient Re-evaluated prior to induction Oxygen Delivery Method: Circle system utilized Preoxygenation: Pre-oxygenation with 100% oxygen Induction Type: IV induction Ventilation: Mask ventilation without difficulty Laryngoscope Size: Mac and 4 Grade View: Grade II Tube type: Oral Tube size: 7.5 mm Number of attempts: 1 Airway Equipment and Method: Stylet Placement Confirmation: ETT inserted through vocal cords under direct vision,  positive ETCO2 and breath sounds checked- equal and bilateral Secured at: 19 cm Tube secured with: Tape Dental Injury: Teeth and Oropharynx as per pre-operative assessment

## 2019-04-30 NOTE — Op Note (Addendum)
PATIENT: Tanya Leon  78 y.o. female  Patient Care Team: [name], MD as PCP - General (Internal Medicine) [name], MD as PCP - Cardiology (Cardiology) Monna Fam, MD as Consulting Physician (Ophthalmology) Zadie Rhine Clent Demark, MD as Consulting Physician (Ophthalmology) Bobbye Charleston, MD as Consulting Physician (Obstetrics and Gynecology) [name], MD as Consulting Physician (Cardiology) Pyrtle, Lajuan Lines, MD as Consulting Physician (Gastroenterology) Jari Pigg, MD as Consulting Physician (Dermatology) Mcarthur Rossetti, MD as Consulting Physician (Orthopedic Surgery) Trula Slade, DPM as Consulting Physician (Podiatry) Earley Favor as Consulting Physician (Dentistry) Vicie Mutters, MD as Consulting Physician (Otolaryngology)  PREOP DIAGNOSIS: Endoscopically unresectable colon polyp - ileocecal valve  POSTOP DIAGNOSIS: Same  PROCEDURE: 1. Laparoscopic right hemicolectomy with ileocolic anastomosis 2. Laparoscopic lysis of adhesions (20 minutes)  SURGEON: Sharon Mt. Jackee Glasner, MD  ASSISTANT: Leighton Ruff, MD  ANESTHESIA: General endotracheal  EBL: 50 mL Total I/O In: 1000 [I.V.:1000] Out: 225 [Urine:200; Blood:25]  DRAINS: None  SPECIMEN:  Right colon (terminal ileum, cecum/appendix, ascending colon)  COUNTS: Sponge, needle and instrument counts were reported correct x2  FINDINGS: Moderate amount of abdominal wall adhesions between omentum/bowel and abdominal wall - taken down sharply. Mesh visualized intraperitoneally and well incorporated. Laparoscopic right hemicolectomy including right branch of middle colic artery with ileocolic anastomosis to transverse colon. Palpable pulse in marginal artery and middle colics at the level of the anastomosis.   STATEMENT OF MEDICAL NECESSITY: Tanya Leon is a very pleasant 78yo female with hx of arthritis, ?OSA, mild pulmonary HTN whom presents as a referral from Dr. Hilarie Fredrickson for  endoscopically unresectable/ill-defined adenomatous tissue about the ileocecal valve. She underwent chromo-endoscopy with Dr. Hilarie Fredrickson 11/13/2022 history of colon polyps. She is found with chromo-endoscopy to have post polypectomy scarring in the cecum, 2 sessile polyps in the cecum which were removed; multiple biopsies and a target management of the ileocecal valve to exclude adenoma. The mucosal and distal side ileocecal valve did not take take staining readily and the tissue did not appear overtly adenomatous but biopsies were taken. These biopsies returned as tubular adenoma. The margins of where this tissue began and it was not clear on endoscopy and therefore additional attempts at removal were deemed not to be feasible by Dr. Hilarie Fredrickson. She was therefore referred to [place] for further evaluation.  After seeing her discussing everything, she opted to pursue surgical intervention.  Please refer to notes elsewhere for details regarding this discussion.   NARRATIVE:  The patient was identified & brought into the operating room, placed supine on the operating table and SCDs were applied to the lower extremities. General endotracheal anesthesia was induced. The patient was positioned supine with arms tucked. Antibiotics were administered. A foley catheter was placed under sterile conditions. Hair in the region of planned surgery was clipped. The abdomen was prepped and draped in a sterile fashion. A timeout was performed confirming our patient and plan.   Beginning with the extraction port, a supraumbilical incision was made and carried down to the midline fascia through her prior incision.  The subcutaneous tissue was then incised with electrocautery. The peritoneum was identified and elevated between clamps and carefully opened sharply.  There were no significant adhesions at this exact location which was high in the abdomen overlying her liver.  The defect was opened further.  Omental adhesions were identified  inferiorly.  These were taken down sharply to allow placement of a wound protector.  A small Alexis wound protector with a cap and  associated port was then placed. The abdomen was insufflated to 15 mmHg with Co2. A laparoscope was placed and camera inspection revealed no evidence of injury. Bilateral TAP blocks were then performed under laparoscopic visualization using a mixture of 0.25% marcaine with epinepherine + Exparel. Additional ports were then placed under direct laparoscopic visualization - two in the left hemiabdomen.  Omental adhesions between abdominal wall and the omentum were then taken down sharply.  There were also adhesions between the proximal transverse colon and the abdominal wall that were taken down sharply.  The small bowel and colon was then inspected and no evidence of injury to the serosa was identified.  The abdomen was surveyed. The liver and peritoneum appeared normal.  The mesh was identified in the mid abdomen and appeared well incorporated.   The patient was positioned in trendelenburg with left side down. The ileocolic pedicle was identified. Gentle blunt dissection commenced around the pedicle and the duodenum was identified and freed from the surrounding structures and well away from the circumferentially dissected ileocolic pedicle. The Enseal device was then used to divide the ileocolic pedicle. The stump was inspected and noted to be completely hemostatic.  The retroperitoneal plane was carefully developed bluntly, separating the ascending mesocolon from the retroperitoneum in the avascular plane. The terminal ileum was then mobilized off the retroperitoneum in this same plane taking care not to injure the retroperitoneal structures. The cecum and ascending colon were then mobilized medially by taking down the Mohammad Granade line of Toldt up to the level of the hepatic flexure sharply.  Hepatic flexure was then taken down from this approach up to the level of the gallbladder.  She was  then repositioned in reverse Trendelenburg. The omentum was taken off of the right transverse colon and the lesser sac was entered.  The transverse mesocolon was mobilized and the remaining attachments between the transverse mesocolon the gallbladder as well as the hepatic flexure were taken down sharply.. The entire colon was then flipped medially and the lateral edge of the duodenum underneath was seen. The cecum and TI reached the LUQ easily.   At this point, the abdomen was desufflated and the terminal ileum and right colon delivered through the wound protector. The terminal ileum was then transected using a GIA blue load stapler. The remaining mesentery was divided using the Enseal device. The divided mesentery was inspected and noted to be hemostatic. The distal point of transection was then identified on the transverse colon at a location that included the right branch of the middle colics, leaving the main middle colic feeding the remaining transverse colon. The transverse colon was then transected using another blue load GIA stapler.  The specimen was then passed off. Attention was turned to creating the anastomosis. The terminal ileum and transverse colon were inspected for orientation to ensure no twisting nor bowel included in the mesenteric defect. An anastomosis was created between the terminal ileum and the transverse colon using a 75 mm GIA blue load stapler. The staple line was inspected and noted to be hemostatic.  The common enterotomy channel was closed using a TA 60 blue load stapler. Hemostasis was achieved at the staple line using 3-0 silk U-stitches. 3-0 silk sutures were used to imbricate the corners of the staple line as well. There was a palpable pulse in the transverse mesentery along the middle colic vessel and additionally the marginal artery.  A 2-0 silk suture was placed securing the "crotch" of the anastomosis. The anastomosis was palpated and  noted to be widely patent. Succus was  passed to and fro through the anastomosis and no leakage of contents occurred. This was then placed back into the abdomen and omentum placed on top. The abdomen was then irrigated with sterile saline and hemostasis verified. There was a small amount of oozing from the falciform ligament at the extraction site so this was controlled by ligating it between clamps. The omentum was down over the anastomosis. The laparoscopic ports were removed under direct visualization and the sites noted to be hemostatic. The Alexis wound protector was removed, counts were reported correct, and we switched to clean instruments, gowns and drapes.  The fascia was then closed using two running #1 PDS sutures.  The skin of all incision sites was closed with 4-0 monocryl subcuticular suture. Dermabond was placed on the port sites and a sterile dressing was placed over the abdominal incision. All counts were reported correct x2. The patient was then awakened from anesthesia, extubated, transferred to a stretcher and transported to the post anesthesia care unit in stable condition.   An MD assistant was necessary for complex tissue manipulation and retraction given her complicated surgical history.  DISPOSITION: PACU in satisfactory condition

## 2019-04-30 NOTE — Transfer of Care (Signed)
Immediate Anesthesia Transfer of Care Note  Patient: Tanya Leon  Procedure(s) Performed: Procedure(s): LAPAROSCOPIC  RIGHT HEMI COLECTOMY (Right)  Patient Location: PACU  Anesthesia Type:General  Level of Consciousness:  sedated, patient cooperative and responds to stimulation  Airway & Oxygen Therapy:Patient Spontanous Breathing and Patient connected to face mask oxgen  Post-op Assessment:  Report given to PACU RN and Post -op Vital signs reviewed and stable  Post vital signs:  Reviewed and stable  Last Vitals:  Vitals:   04/30/19 0545  BP: (!) 145/79  Pulse: 64  Resp: 18  Temp: 36.6 C  SpO2: 49%    Complications: No apparent anesthesia complications

## 2019-05-01 ENCOUNTER — Encounter (HOSPITAL_COMMUNITY): Payer: Self-pay | Admitting: Surgery

## 2019-05-01 LAB — CBC
HCT: 35.7 % — ABNORMAL LOW (ref 36.0–46.0)
Hemoglobin: 11.2 g/dL — ABNORMAL LOW (ref 12.0–15.0)
MCH: 30.8 pg (ref 26.0–34.0)
MCHC: 31.4 g/dL (ref 30.0–36.0)
MCV: 98.1 fL (ref 80.0–100.0)
Platelets: 122 10*3/uL — ABNORMAL LOW (ref 150–400)
RBC: 3.64 MIL/uL — ABNORMAL LOW (ref 3.87–5.11)
RDW: 13.4 % (ref 11.5–15.5)
WBC: 7.2 10*3/uL (ref 4.0–10.5)
nRBC: 0 % (ref 0.0–0.2)

## 2019-05-01 LAB — BASIC METABOLIC PANEL
Anion gap: 6 (ref 5–15)
BUN: 9 mg/dL (ref 8–23)
CO2: 26 mmol/L (ref 22–32)
Calcium: 8 mg/dL — ABNORMAL LOW (ref 8.9–10.3)
Chloride: 104 mmol/L (ref 98–111)
Creatinine, Ser: 0.69 mg/dL (ref 0.44–1.00)
GFR calc Af Amer: >60 mL/min (ref >60)
GFR calc non Af Amer: >60 mL/min (ref >60)
Glucose, Bld: 112 mg/dL — ABNORMAL HIGH (ref 70–99)
Potassium: 3.3 mmol/L — ABNORMAL LOW (ref 3.5–5.1)
Sodium: 136 mmol/L (ref 135–145)

## 2019-05-01 NOTE — Progress Notes (Signed)
  Progress Note: General Surgery Service   Assessment/Plan: Active Problems:   Colon polyp  s/p Procedure(s): LAPAROSCOPIC  RIGHT HEMI COLECTOMY 04/30/2019 -advance diet -stop IVF -ambulate    LOS: 1 day  Chief Complaint/Subjective: Pain well controlled, tolerating liquids, no flatus  Objective: Vital signs in last 24 hours: Temp:  [97.4 F (36.3 C)-98.3 F (36.8 C)] 98 F (36.7 C) (08/01 0522) Pulse Rate:  [57-96] 57 (08/01 0522) Resp:  [15-18] 15 (08/01 0522) BP: (99-134)/(60-77) 118/66 (08/01 0522) SpO2:  [92 %-98 %] 95 % (08/01 0522) Last BM Date: 04/29/19  Intake/Output from previous day: 07/31 0701 - 08/01 0700 In: 3320.8 [P.O.:1200; I.V.:2020.8; IV Piggyback:100] Out: 3212 [Urine:1800; Blood:25] Intake/Output this shift: No intake/output data recorded.  Lungs: nonlabored  Cardiovascular: RRR  Abd: soft, ATTP, incisions c/d/i, some fullness at LLQ trocar site consistent with small hematoma  Extremities: no edema  Neuro: AOx4  Lab Results: CBC  Recent Labs    05/01/19 0443  WBC 7.2  HGB 11.2*  HCT 35.7*  PLT 122*   BMET Recent Labs    05/01/19 0443  NA 136  K 3.3*  CL 104  CO2 26  GLUCOSE 112*  BUN 9  CREATININE 0.69  CALCIUM 8.0*   PT/INR No results for input(s): LABPROT, INR in the last 72 hours. ABG No results for input(s): PHART, HCO3 in the last 72 hours.  Invalid input(s): PCO2, PO2  Studies/Results:  Anti-infectives: Anti-infectives (From admission, onward)   Start     Dose/Rate Route Frequency Ordered Stop   04/30/19 0600  cefoTEtan (CEFOTAN) 2 g in sodium chloride 0.9 % 100 mL IVPB     2 g 200 mL/hr over 30 Minutes Intravenous On call to O.R. 04/30/19 0544 04/30/19 0820      Medications: Scheduled Meds: . acetaminophen  1,000 mg Oral Q6H  . Brinzolamide-Brimonidine  1 drop Left Eye BID  . calcium carbonate  1 tablet Oral Q breakfast   And  . magnesium oxide  200 mg Oral Q breakfast  . cholecalciferol  2,000  Units Oral Daily  . cycloSPORINE  1 drop Both Eyes BID  . DULoxetine  60 mg Oral Daily  . feeding supplement  237 mL Oral BID BM  . fluticasone  2 spray Each Nare Daily  . heparin injection (subcutaneous)  5,000 Units Subcutaneous Q8H  . pregabalin  25 mg Oral BID  . tiZANidine  2 mg Oral QHS   Continuous Infusions: PRN Meds:.alum & mag hydroxide-simeth, diphenhydrAMINE **OR** diphenhydrAMINE, hydrALAZINE, HYDROmorphone (DILAUDID) injection, ibuprofen, olopatadine, ondansetron **OR** ondansetron (ZOFRAN) IV, polyvinyl alcohol, traMADol  Mickeal Skinner, MD [facility]

## 2019-05-02 LAB — BASIC METABOLIC PANEL
Anion gap: 8 (ref 5–15)
BUN: 10 mg/dL (ref 8–23)
CO2: 26 mmol/L (ref 22–32)
Calcium: 8.1 mg/dL — ABNORMAL LOW (ref 8.9–10.3)
Chloride: 103 mmol/L (ref 98–111)
Creatinine, Ser: 0.62 mg/dL (ref 0.44–1.00)
GFR calc Af Amer: >60 mL/min (ref >60)
GFR calc non Af Amer: >60 mL/min (ref >60)
Glucose, Bld: 89 mg/dL (ref 70–99)
Potassium: 3.5 mmol/L (ref 3.5–5.1)
Sodium: 137 mmol/L (ref 135–145)

## 2019-05-02 LAB — CBC
HCT: 37.1 % (ref 36.0–46.0)
Hemoglobin: 11.6 g/dL — ABNORMAL LOW (ref 12.0–15.0)
MCH: 30.4 pg (ref 26.0–34.0)
MCHC: 31.3 g/dL (ref 30.0–36.0)
MCV: 97.4 fL (ref 80.0–100.0)
Platelets: 123 10*3/uL — ABNORMAL LOW (ref 150–400)
RBC: 3.81 MIL/uL — ABNORMAL LOW (ref 3.87–5.11)
RDW: 13.2 % (ref 11.5–15.5)
WBC: 6.2 10*3/uL (ref 4.0–10.5)
nRBC: 0 % (ref 0.0–0.2)

## 2019-05-02 MED ORDER — TRAMADOL HCL 50 MG PO TABS: 50.0000 mg | ORAL_TABLET | Freq: Four times a day (QID) | ORAL | 0 refills | Status: DC | PRN

## 2019-05-02 NOTE — Progress Notes (Signed)
Reviewed d/c paperwork with patient, went over d/c instructions, follow up appointment, & medication regimen.Walked patient down to car where her husband picked her up.

## 2019-05-02 NOTE — Discharge Instructions (Signed)
POST OP INSTRUCTIONS AFTER COLON SURGERY  1. DIET: Be sure to include lots of fluids daily to stay hydrated - 64oz of water per day (8, 8 oz glasses).  Avoid fast food or heavy meals for the first couple of weeks as your are more likely to get nauseated. Avoid raw/uncooked fruits or vegetables for the first 4 weeks (its ok to have these if they are blended into smoothie form). If you have fruits/vegetables, make sure they are cooked until soft enough to mash on the roof of your mouth and chew your food well. Otherwise, diet as tolerated.  2. Take your usually prescribed home medications unless otherwise directed.  3. PAIN CONTROL: a. Pain is best controlled by a usual combination of three different methods TOGETHER: i. Ice/Heat ii. Over the counter pain medication iii. Prescription pain medication b. Most patients will experience some swelling and bruising around the surgical site.  Ice packs or heating pads (30-60 minutes up to 6 times a day) will help. Some people prefer to use ice alone, heat alone, alternating between ice & heat.  Experiment to what works for you.  Swelling and bruising can take several weeks to resolve.   c. It is helpful to take an over-the-counter pain medication regularly for the first few weeks: i. Ibuprofen (Motrin/Advil) - 200mg  tabs - take 3 tabs (600mg ) every 6 hours as needed for pain (unless you have been directed previously to avoid NSAIDs/ibuprofen) ii. Acetaminophen (Tylenol) - you may take 650mg  every 6 hours as needed. You can take this with motrin as they act differently on the body. If you are taking a narcotic pain medication that has acetaminophen in it, do not take over the counter tylenol at the same time. iii. NOTE: You may take both of these medications together - most patients  find it most helpful when alternating between the two (i.e. Ibuprofen at 6am, tylenol at 9am, ibuprofen at 12pm ...) d. A  prescription for pain medication should be given to you  upon discharge.  Take your pain medication as prescribed if your pain is not adequatly controlled with the over-the-counter pain reliefs mentioned above.  4. Avoid getting constipated.  Between the surgery and the pain medications, it is common to experience some constipation.  Increasing fluid intake and taking a fiber supplement (such as Metamucil, Citrucel, FiberCon, MiraLax, etc) 1-2 times a day regularly will usually help prevent this problem from occurring.  A mild laxative (prune juice, Milk of Magnesia, MiraLax, etc) should be taken according to package directions if there are no bowel movements after 48 hours.    5. Dressing: Your incisions are covered in Dermabond which is like sterile superglue for the skin. This will come off on it's own in a couple weeks. It is waterproof and you may bathe normally starting the day after your surgery in a shower. Avoid baths/pools/lakes/oceans until your wounds have fully healed.  6. ACTIVITIES as tolerated:   a. Avoid heavy lifting (>10lbs or 1 gallon of milk) for the next 6 weeks. b. You may resume regular daily activities as tolerated--such as daily self-care, walking, climbing stairs--gradually increasing activities as tolerated.  If you can walk 30 minutes without difficulty, it is safe to try more intense activity such as jogging, treadmill, bicycling, low-impact aerobics.  c. DO NOT PUSH THROUGH PAIN.  Let pain be your guide: If it hurts to do something, don't do it. d. Dennis Bast may drive when you are no longer taking prescription pain medication, you  can comfortably wear a seatbelt, and you can safely maneuver your car and apply brakes. e. Dennis Bast may have sexual intercourse when it is comfortable.   7. FOLLOW UP in our office a. Please call [facility] at [phone] to set up an appointment to see your surgeon in the office for a follow-up appointment approximately 2 weeks after your surgery. b. Make sure that you call for this appointment the day you arrive  home to insure a convenient appointment time.  9. If you have disability or family leave forms that need to be completed, you may have them completed by your primary care physician's office; for return to work instructions, please ask our office staff and they will be happy to assist you in obtaining this documentation   When to call us [phone]: 1. Poor pain control 2. Reactions / problems with new medications (rash/itching, etc)  3. Fever over 101.5 F (38.5 C) 4. Inability to urinate 5. Nausea/vomiting 6. Worsening swelling or bruising 7. Continued bleeding from incision. 8. Increased pain, redness, or drainage from the incision  The clinic staff is available to answer your questions during regular business hours (8:30am-5pm).  Please dont hesitate to call and ask to speak to one of our nurses for clinical concerns.   A surgeon from [facility] is always on call at the hospitals   If you have a medical emergency, go to the nearest emergency room or call 911.  [facility], [place] [address], [place], [place],   [address] MAIN: [phone] FAX: [phone] [email or web address]

## 2019-05-02 NOTE — Discharge Summary (Signed)
Physician Discharge Summary  Patient ID: Tanya Leon MRN: [id number] DOB/AGE: [birth year] 79 y.o.  Admit date: 04/30/2019 Discharge date: 05/02/2019  Admission Diagnoses:  Discharge Diagnoses:  Active Problems:   Colon polyp   Discharged Condition: good  Hospital Course: 78 yo female presented for right hemicolectomy for colon polyp. Postoperatively she was admitted to the surgical floor. Her diet was advanced she had flatus POD 1 and a bowel movement POD 2. She was ambulating well and eating well and discharged POD 2.  Consults: None  Significant Diagnostic Studies:  CBC    Component Value Date/Time   WBC 6.2 05/02/2019 0444   RBC 3.81 (L) 05/02/2019 0444   HGB 11.6 (L) 05/02/2019 0444   HGB 13.3 01/22/2016 0000   HCT 37.1 05/02/2019 0444   HCT 40.9 01/22/2016 0000   PLT 123 (L) 05/02/2019 0444   MCV 97.4 05/02/2019 0444   MCV 89 01/22/2016 0000   MCH 30.4 05/02/2019 0444   MCHC 31.3 05/02/2019 0444   RDW 13.2 05/02/2019 0444   RDW 14.8 01/22/2016 0000   LYMPHSABS 1.4 04/27/2019 0938   LYMPHSABS 1.7 01/22/2016 0000   MONOABS 0.4 04/27/2019 0938   EOSABS 0.1 04/27/2019 0938   EOSABS 0.1 01/22/2016 0000   BASOSABS 0.0 04/27/2019 0938   BASOSABS 0.0 01/22/2016 0000     Treatments: surgery: lap r hemicolectomy  Discharge Exam: Blood pressure 130/72, pulse 71, temperature 97.7 F (36.5 C), temperature source Oral, resp. rate 20, height 5\' 2"  (1.575 m), weight 62.3 kg, SpO2 98 %. General appearance: alert and cooperative Head: Normocephalic, without obvious abnormality, atraumatic Neck: no adenopathy, no carotid bruit, no JVD, supple, symmetrical, trachea midline and thyroid not enlarged, symmetric, no tenderness/mass/nodules GI: incision c/d/i, some ecchymosis, no erythema or drainage  Disposition: Discharge disposition: 01-Home or Self Care       Discharge Instructions    Call MD for:  persistant nausea and vomiting   Complete by: As directed    Call MD  for:  redness, tenderness, or signs of infection (pain, swelling, redness, odor or green/yellow discharge around incision site)   Complete by: As directed    Call MD for:  severe uncontrolled pain   Complete by: As directed    Call MD for:  temperature >100.4   Complete by: As directed    Diet - low sodium heart healthy   Complete by: As directed    Increase activity slowly   Complete by: As directed    No dressing needed   Complete by: As directed      Allergies as of 05/02/2019      Reactions   Crab [shellfish Allergy] Swelling   Soft shelled crab   Doxycycline Rash      Medication List    STOP taking these medications   amoxicillin 500 MG capsule Commonly known as: AMOXIL   diclofenac sodium 1 % Gel Commonly known as: VOLTAREN     TAKE these medications   azithromycin 1 % ophthalmic solution Commonly known as: AZASITE Place 1 drop into both eyes See admin instructions. Apply drop onto each eye lid for 10 days out of each month.   Biotin 5000 MCG Tabs Take 5,000 mcg by mouth every evening.   Calcium-Magnesium 500-250 MG Tabs Take 1 tablet by mouth daily.   carboxymethylcellulose 0.5 % Soln Commonly known as: REFRESH PLUS Place 1 drop into both eyes 3 (three) times daily as needed (Dry eyes).   Ciclopirox 1 % shampoo Apply 1 each  topically 2 (two) times a week.   cycloSPORINE 0.05 % ophthalmic emulsion Commonly known as: RESTASIS Place 1 drop into both eyes 2 (two) times daily.   DULoxetine 60 MG capsule Commonly known as: CYMBALTA TAKE 1 CAPSULE BY MOUTH EVERY DAY What changed: how much to take   estradiol 0.1 MG/24HR patch Commonly known as: VIVELLE-DOT Place 1 patch onto the skin 2 (two) times a week.   EVENING PRIMROSE OIL PO Take 1,300 mg by mouth 2 (two) times daily.   Excedrin Extra Strength 250-250-65 MG tablet Generic drug: aspirin-acetaminophen-caffeine Take 2 tablets by mouth every 6 (six) hours as needed for headache.   Finacea 15 %  cream Generic drug: Azelaic Acid Apply 1 application topically 2 (two) times daily. After skin is thoroughly washed and patted dry, gently but thoroughly massage a thin film of azelaic acid cream into the affected area twice daily, in the morning and evening.   fluocinonide 0.05 % external solution Commonly known as: LIDEX Apply 1 application topically 2 (two) times a week. Applied to scalp after shampooing   fluticasone 50 MCG/ACT nasal spray Commonly known as: FLONASE USE 2 SPRAYS IN EACH NOSTRIL DAILY What changed:   how much to take  how to take this  when to take this   Lutein 20 MG Tabs Take 20 mg by mouth 2 (two) times daily.   multivitamin with minerals Tabs tablet Take 1 tablet by mouth daily.   NONFORMULARY OR COMPOUNDED ITEM [facility]:  Antifungal Nail Lacquer - Terbinafine 3%, Fluconazole 2%, Tea Tree Oil 5%, Urea 10%, Ibuprofen 2%, in DMSO suspension #60ml, apply to affected nails daily at bedtime or twice daily. What changed:   how much to take  how to take this  when to take this   olopatadine 0.1 % ophthalmic solution Commonly known as: PATANOL Place 1 drop into both eyes 2 (two) times daily as needed for allergies.   omega-3 acid ethyl esters 1 g capsule Commonly known as: LOVAZA Take 1,000 mg by mouth 2 (two) times daily.   polyethylene glycol powder 17 GM/SCOOP powder Commonly known as: GLYCOLAX/MIRALAX Take 17 g by mouth daily.   pregabalin 25 MG capsule Commonly known as: LYRICA Take 1 capsule (25 mg total) by mouth 2 (two) times daily. TAKE 1 CAPSULE IN THE MORNING AND TAKE 1 CAPSULE IN THE EVENING   PRESERVISION AREDS 2 PO Take 1 tablet by mouth 2 (two) times daily.   Simbrinza 1-0.2 % Susp Generic drug: Brinzolamide-Brimonidine Place 1 drop into the left eye 2 (two) times a day.   tiZANidine 2 MG tablet Commonly known as: ZANAFLEX Take 1-2 tablets (2-4 mg total) by mouth at bedtime. What changed: how much to take    traMADol 50 MG tablet Commonly known as: ULTRAM Take 1 tablet (50 mg total) by mouth every 6 (six) hours as needed (pain not controlled with ibuprofen).   Vitamin D3 50 MCG (2000 UT) Tabs Take 2,000 Units by mouth every evening.        Signed: Arta Bruce  05/02/2019, 8:14 AM

## 2019-05-03 ENCOUNTER — Telehealth: Payer: Self-pay | Admitting: *Deleted

## 2019-05-03 LAB — NOVEL CORONAVIRUS, NAA (HOSP ORDER, SEND-OUT TO REF LAB; TAT 18-24 HRS): SARS-CoV-2, NAA: NOT DETECTED

## 2019-05-03 NOTE — Telephone Encounter (Signed)
Patient has been scheduled for follow up appointment on 06/22/19 at 210pm. She verbalizes understanding.

## 2019-05-03 NOTE — Telephone Encounter (Signed)
-----   Message from Jerene Bears, MD sent at 05/03/2019 11:24 AM EDT ----- Regarding: RE: Ileocecal valve out! Thanks for your help. I had attempted resection multiple times and adenomatous tissue remained. Glad it went well. I will see her in a few months for continuity Thanks again Willis Modena, OV in late Sept or Oct for post-surgery followup JMP ----- Message ----- From: Ileana Roup, MD Sent: 05/03/2019  11:05 AM EDT To: Jerene Bears, MD Subject: Ileocecal valve out!                           Remonia Richter - we finally got Ms. Panzer squared away. 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 She had delayed scheduling initially for personal reasons/COVID. She had persistent adenomatous tissue around her ileocecal valve that had ill defined margins. She underwent laparoscopic right hemicolectomy 7/31 and has done well thus far. We got her path back today - tubular adenoma without dysplasia. I called and let her know everything and she was unsure when you wanted to ultimately repeat her colonoscopy. I told her I'd shoot you a message. Let me know if you have any questions! I appreciate your help with her.  Gerald Stabs

## 2019-05-14 DIAGNOSIS — Z85828 Personal history of other malignant neoplasm of skin: Secondary | ICD-10-CM | POA: Diagnosis not present

## 2019-05-14 DIAGNOSIS — L821 Other seborrheic keratosis: Secondary | ICD-10-CM | POA: Diagnosis not present

## 2019-05-14 DIAGNOSIS — Z411 Encounter for cosmetic surgery: Secondary | ICD-10-CM | POA: Diagnosis not present

## 2019-05-14 DIAGNOSIS — L723 Sebaceous cyst: Secondary | ICD-10-CM | POA: Diagnosis not present

## 2019-05-14 DIAGNOSIS — L570 Actinic keratosis: Secondary | ICD-10-CM | POA: Diagnosis not present

## 2019-05-14 DIAGNOSIS — D3610 Benign neoplasm of peripheral nerves and autonomic nervous system, unspecified: Secondary | ICD-10-CM | POA: Diagnosis not present

## 2019-06-04 ENCOUNTER — Other Ambulatory Visit: Payer: Self-pay | Admitting: Family Medicine

## 2019-06-22 ENCOUNTER — Ambulatory Visit (INDEPENDENT_AMBULATORY_CARE_PROVIDER_SITE_OTHER): Payer: Medicare Other | Admitting: Internal Medicine

## 2019-06-22 ENCOUNTER — Encounter: Payer: Self-pay | Admitting: Internal Medicine

## 2019-06-22 ENCOUNTER — Other Ambulatory Visit: Payer: Self-pay

## 2019-06-22 VITALS — BP 138/90 | HR 80 | Wt 136.0 lb

## 2019-06-22 DIAGNOSIS — Z9049 Acquired absence of other specified parts of digestive tract: Secondary | ICD-10-CM

## 2019-06-22 DIAGNOSIS — Z86010 Personal history of colonic polyps: Secondary | ICD-10-CM

## 2019-06-22 NOTE — Patient Instructions (Signed)
Follow up with Dr Hilarie Fredrickson in Summer 2021.  If you are age 79 or older, your body mass index should be between 23-30. Your Body mass index is 24.87 kg/m. If this is out of the aforementioned range listed, please consider follow up with your Primary Care Provider.  If you are age 54 or younger, your body mass index should be between 19-25. Your Body mass index is 24.87 kg/m. If this is out of the aformentioned range listed, please consider follow up with your Primary Care Provider.

## 2019-06-23 NOTE — Progress Notes (Signed)
   Subjective:    Patient ID: Tanya Leon, female    DOB: 1941/05/28, 78 y.o.   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  She is here today with her husband.  She was last seen on 04/13/2019.  She underwent a successful right hemicolectomy performed by Dr. Dema Severin on 04/30/2019.  This was performed for persistent adenomatous polyp in the cecum near the ileocecal valve despite multiple attempts at resection and chromoendoscopy.  The surgery was successful and pathology did reveal persistent tubular adenoma without high-grade dysplasia.  She had a short 2 night stay in the hospital and has recovered well.  She has no abdominal pain.  She has resumed her MiraLAX therapy and is having 5-6 bowel movements per day.  She has not had significant diarrhea.  No bleeding.  Appetite has returned to normal as have her energy levels.  She is very happy with her progress.   Review of Systems As per HPI, otherwise negative  Current Medications, Allergies, Past Medical History, Past Surgical History, Family History and Social History were reviewed in [employer] record.     Objective:   Physical Exam BP 138/90   Pulse 80   Wt 136 lb (61.7 kg)   BMI 24.87 kg/m  Gen: awake, alert, NAD HEENT: anicteric, op clear CV: RRR, no mrg Pulm: CTA b/l Abd: soft, NT/ND, +BS throughout, well-healed surgical laparoscopic incisions Ext: no c/c/e Neuro: nonfocal  CBC    Component Value Date/Time   WBC 6.2 05/02/2019 0444   RBC 3.81 (L) 05/02/2019 0444   HGB 11.6 (L) 05/02/2019 0444   HGB 13.3 01/22/2016 0000   HCT 37.1 05/02/2019 0444   HCT 40.9 01/22/2016 0000   PLT 123 (L) 05/02/2019 0444   MCV 97.4 05/02/2019 0444   MCV 89 01/22/2016 0000   MCH  30.4 05/02/2019 0444   MCHC 31.3 05/02/2019 0444   RDW 13.2 05/02/2019 0444   RDW 14.8 01/22/2016 0000   LYMPHSABS 1.4 04/27/2019 0938   LYMPHSABS 1.7 01/22/2016 0000   MONOABS 0.4 04/27/2019 0938   EOSABS 0.1 04/27/2019 0938   EOSABS 0.1 01/22/2016 0000   BASOSABS 0.0 04/27/2019 0938   BASOSABS 0.0 01/22/2016 0000       Assessment & Plan:  78 year old female with a history of 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   1.  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  Multiple attempts at colonoscopic EMR was unsuccessful adequately and completely removing this lesion.  We can discuss next summer when to consider surveillance colonoscopy.  There are no true guidelines here we may consider looking at the one-year mark. --Continue MiraLAX daily --Follow-up with me summer 2020 around July, sooner if needed  2.  Chronic constipation --she will be continuing her MiraLAX therapy  25 minutes spent with the patient today. Greater than 50% was spent in counseling and coordination of care with the patient

## 2019-06-30 DIAGNOSIS — I272 Pulmonary hypertension, unspecified: Secondary | ICD-10-CM | POA: Diagnosis not present

## 2019-06-30 DIAGNOSIS — D696 Thrombocytopenia, unspecified: Secondary | ICD-10-CM | POA: Diagnosis not present

## 2019-06-30 DIAGNOSIS — D692 Other nonthrombocytopenic purpura: Secondary | ICD-10-CM | POA: Diagnosis not present

## 2019-07-01 DIAGNOSIS — M4805 Spinal stenosis, thoracolumbar region: Secondary | ICD-10-CM | POA: Diagnosis not present

## 2019-07-01 DIAGNOSIS — Z23 Encounter for immunization: Secondary | ICD-10-CM | POA: Diagnosis not present

## 2019-07-01 DIAGNOSIS — Z0000 Encounter for general adult medical examination without abnormal findings: Secondary | ICD-10-CM | POA: Diagnosis not present

## 2019-07-04 DIAGNOSIS — Z23 Encounter for immunization: Secondary | ICD-10-CM | POA: Diagnosis not present

## 2019-07-12 DIAGNOSIS — M6281 Muscle weakness (generalized): Secondary | ICD-10-CM | POA: Diagnosis not present

## 2019-07-14 DIAGNOSIS — H53422 Scotoma of blind spot area, left eye: Secondary | ICD-10-CM | POA: Diagnosis not present

## 2019-07-14 DIAGNOSIS — H35371 Puckering of macula, right eye: Secondary | ICD-10-CM | POA: Diagnosis not present

## 2019-07-14 DIAGNOSIS — H401132 Primary open-angle glaucoma, bilateral, moderate stage: Secondary | ICD-10-CM | POA: Diagnosis not present

## 2019-07-14 DIAGNOSIS — H353132 Nonexudative age-related macular degeneration, bilateral, intermediate dry stage: Secondary | ICD-10-CM | POA: Diagnosis not present

## 2019-07-14 DIAGNOSIS — H35372 Puckering of macula, left eye: Secondary | ICD-10-CM | POA: Diagnosis not present

## 2019-07-21 DIAGNOSIS — M6281 Muscle weakness (generalized): Secondary | ICD-10-CM | POA: Diagnosis not present

## 2019-07-26 ENCOUNTER — Other Ambulatory Visit: Payer: Self-pay | Admitting: Family Medicine

## 2019-07-27 ENCOUNTER — Other Ambulatory Visit: Payer: Self-pay

## 2019-07-27 ENCOUNTER — Ambulatory Visit (HOSPITAL_COMMUNITY): Payer: Medicare Other | Attending: Cardiology

## 2019-07-27 DIAGNOSIS — I272 Pulmonary hypertension, unspecified: Secondary | ICD-10-CM | POA: Insufficient documentation

## 2019-07-30 ENCOUNTER — Telehealth: Payer: Self-pay | Admitting: Cardiovascular Disease

## 2019-07-30 ENCOUNTER — Ambulatory Visit: Payer: Medicare Other | Admitting: Cardiovascular Disease

## 2019-07-30 NOTE — Telephone Encounter (Signed)
Patient wanted to know if her husband would be allowed to come with her to her upcoming appointment. She was able to come with him to his recent appointment

## 2019-08-02 NOTE — Telephone Encounter (Signed)
Advised pt of Covid restrictions with visitors. Verbalized understanding. Offered Dr Gwenlyn Found to call husband while in room. Pt agreed.

## 2019-08-02 NOTE — Telephone Encounter (Signed)
Patient is following up on whether or not her husband can come with her to her appt.

## 2019-08-03 ENCOUNTER — Encounter: Payer: Self-pay | Admitting: Cardiovascular Disease

## 2019-08-03 ENCOUNTER — Ambulatory Visit (INDEPENDENT_AMBULATORY_CARE_PROVIDER_SITE_OTHER): Payer: Medicare Other | Admitting: Cardiovascular Disease

## 2019-08-03 ENCOUNTER — Other Ambulatory Visit: Payer: Self-pay

## 2019-08-03 VITALS — BP 142/84 | HR 56 | Temp 97.2°F | Ht 62.0 in | Wt 141.8 lb

## 2019-08-03 DIAGNOSIS — I272 Pulmonary hypertension, unspecified: Secondary | ICD-10-CM | POA: Diagnosis not present

## 2019-08-03 DIAGNOSIS — I340 Nonrheumatic mitral (valve) insufficiency: Secondary | ICD-10-CM | POA: Diagnosis not present

## 2019-08-03 DIAGNOSIS — E785 Hyperlipidemia, unspecified: Secondary | ICD-10-CM

## 2019-08-03 NOTE — Progress Notes (Signed)
08/03/2019 Tanya Leon   08-05-1941  CN:[id number]  Primary Physician Tanya Glazier, MD Primary Cardiologist: Tanya Harp MD Tanya Leon, [place]  HPI:  Tanya Leon is a 78 y.o.  thin-appearing married Caucasian female mother of 2, grandmother, 4 grandchildren was accompanied by her husband Tanya Leon who is a patient of mine as well.  I last saw her in the office 07/22/2018 she has no cardiac risk factors other than her mother died of a myocardial infarction at age 66. She has never had a heart attack or stroke. She denies chest pain or shortness of breath. She has noticed palpitations over the last month with some occasional dizziness. EKG performed by her PCP revealed PVCs as did her EKG today. She was drinking 2 cups of coffee a day which she has changed to decaf. Thyroid function tests were normal.   Since I saw her in the office a year ago she did have a monitor which failed to show PVCs.  She says that her palpitations have resolved since I last saw her.  Her most recent 2D echo performed 07/27/2019 showed normal LV size and function, mild bileaflet prolapse with mild to moderate MR, and diastolic dysfunction with a RV systolic blood pressure wound which was mildly elevated at 35 mmHg.  She does exercise, walks [facility] 3 flights of stairs [facility] which is where she and her husband live does complain of some mild shortness of breath with with moderate exertion.  She can play pickle ball for an hour with only some shortness of breath.  I have reassured her that this is fairly normal for her age   Current Meds  Medication Sig  . aspirin-acetaminophen-caffeine ([place]) [id number] MG per tablet Take 2 tablets by mouth every 6 (six) hours as needed for headache.   . Azelaic Acid (FINACEA) 15 % cream Apply 1 application topically 2 (two) times daily. After skin is thoroughly washed and patted dry, gently but thoroughly massage a thin film of azelaic acid cream into  the affected area twice daily, in the morning and evening.  Marland Kitchen azithromycin (AZASITE) 1 % ophthalmic solution Place 1 drop into both eyes See admin instructions. Apply drop onto each eye lid for 10 days out of each month.  . Biotin 5000 MCG TABS Take 5,000 mcg by mouth every evening.  . Brinzolamide-Brimonidine (SIMBRINZA) 1-0.2 % SUSP Place 1 drop into the left eye 2 (two) times a day.  . Calcium-Magnesium 500-250 MG TABS Take 1 tablet by mouth daily.  . carboxymethylcellulose (REFRESH PLUS) 0.5 % SOLN Place 1 drop into both eyes 3 (three) times daily as needed (Dry eyes).   . Cholecalciferol (VITAMIN D3) 50 MCG (2000 UT) TABS Take 2,000 Units by mouth every evening.  . Ciclopirox 1 % shampoo Apply 1 each topically 2 (two) times a week.   . cycloSPORINE (RESTASIS) 0.05 % ophthalmic emulsion Place 1 drop into both eyes 2 (two) times daily.   . DULoxetine (CYMBALTA) 60 MG capsule TAKE 1 CAPSULE BY MOUTH EVERY DAY (Patient taking differently: Take 60 mg by mouth daily. )  . estradiol (VIVELLE-DOT) 0.1 MG/24HR patch Place 1 patch onto the skin 2 (two) times a week.   Marland Kitchen EVENING PRIMROSE OIL PO Take 1,300 mg by mouth 2 (two) times daily.  . fluocinonide (LIDEX) 0.05 % external solution Apply 1 application topically 2 (two) times a week. Applied to scalp after shampooing   . fluticasone (FLONASE) 50  MCG/ACT nasal spray USE 2 SPRAYS IN EACH NOSTRIL DAILY (Patient taking differently: Place 2 sprays into both nostrils daily. USE 2 SPRAYS IN EACH NOSTRIL DAILY)  . Lutein 20 MG TABS Take 20 mg by mouth 2 (two) times daily.   . Multiple Vitamin (MULTIVITAMIN WITH MINERALS) TABS tablet Take 1 tablet by mouth daily.  . Multiple Vitamins-Minerals (PRESERVISION AREDS 2 PO) Take 1 tablet by mouth 2 (two) times daily.  . NONFORMULARY OR COMPOUNDED ITEM [facility]:  Antifungal Nail Lacquer - Terbinafine 3%, Fluconazole 2%, Tea Tree Oil 5%, Urea 10%, Ibuprofen 2%, in DMSO suspension #64ml, apply to affected  nails daily at bedtime or twice daily. (Patient taking differently: Apply 1 application topically at bedtime. [facility]:  Antifungal Nail Lacquer - Terbinafine 3%, Fluconazole 2%, Tea Tree Oil 5%, Urea 10%, Ibuprofen 2%, in DMSO suspension #67ml, apply to affected nails daily at bedtime or twice daily.)  . olopatadine (PATANOL) 0.1 % ophthalmic solution Place 1 drop into both eyes 2 (two) times daily as needed for allergies.   Marland Kitchen omega-3 acid ethyl esters (LOVAZA) 1 G capsule Take 1,000 mg by mouth 2 (two) times daily.   . polyethylene glycol powder (GLYCOLAX/MIRALAX) powder Take 17 g by mouth daily.   . pregabalin (LYRICA) 25 MG capsule Take 1 capsule (25 mg total) by mouth 2 (two) times daily. TAKE 1 CAPSULE IN THE MORNING AND TAKE 1 CAPSULE IN THE EVENING (Patient taking differently: Take 25 mg by mouth 2 (two) times daily. TAKE 3 CAPSULE IN THE MORNING AND TAKE 2 CAPSULE IN THE EVENING)  . tiZANidine (ZANAFLEX) 2 MG tablet Take 1 tablet (2 mg total) by mouth at bedtime as needed.     Allergies  Allergen Reactions  . Crab [Shellfish Allergy] Swelling    Soft shelled crab  . Doxycycline Rash    Social History   Socioeconomic History  . Marital status: Married    Spouse name: Not on file  . Number of children: 2  . Years of education: Not on file  . Highest education level: Bachelor's degree (e.g., BA, AB, BS)  Occupational History  . 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 Financial resource strain: Not on file  . Food insecurity    Worry: Not on file    Inability: Not on file  . Transportation needs    Medical: Not on file    Non-medical: Not on file  Tobacco Use  . Smoking status: Never Smoker  . Smokeless tobacco: Never Used  Substance and Sexual Activity  . Alcohol use: Yes    Alcohol/week: 7.0 standard drinks    Types: 7 Glasses of wine per week    Comment: 1 glass wine with dinner most evenings  . Drug use: No  . Sexual activity: Yes     Birth control/protection: Post-menopausal  Lifestyle  . Physical activity    Days per week: Not on file    Minutes per session: Not on file  . Stress: Not on file  Relationships  . Social Herbalist on phone: Not on file    Gets together: Not on file    Attends religious service: Not on file    Active member of club or organization: Not on file    Attends meetings of clubs or organizations: Not on file    Relationship status: Not on file  . Intimate partner violence    Fear of current or ex partner: Not on  file    Emotionally abused: Not on file    Physically abused: Not on file    Forced sexual activity: Not on file  Other Topics Concern  . Not on file  Social History Narrative   Married 65 years in 2015. 2 kids. 4 grandkids.       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 Previously [employer].      Review of Systems: General: negative for chills, fever, night sweats or weight changes.  Cardiovascular: negative for chest pain, dyspnea on exertion, edema, orthopnea, palpitations, paroxysmal nocturnal dyspnea or shortness of breath Dermatological: negative for rash Respiratory: negative for cough or wheezing Urologic: negative for hematuria Abdominal: negative for nausea, vomiting, diarrhea, bright red blood per rectum, melena, or hematemesis Neurologic: negative for visual changes, syncope, or dizziness All other systems reviewed and are otherwise negative except as noted above.    Blood pressure (!) 158/84, pulse (!) 56, temperature (!) 97.2 F (36.2 C), height 5\' 2"  (1.575 m), weight 141 lb 12.8 oz (64.3 kg), SpO2 96 %.  General appearance: alert and no distress Neck: no adenopathy, no carotid bruit, no JVD, supple, symmetrical, trachea midline and thyroid not enlarged, symmetric, no tenderness/mass/nodules Lungs: clear to auscultation bilaterally Heart: regular rate and rhythm, S1, S2 normal, no murmur, click,  rub or gallop Extremities: extremities normal, atraumatic, no cyanosis or edema Pulses: 2+ and symmetric Skin: Skin color, texture, turgor normal. No rashes or lesions Neurologic: Alert and oriented X 3, normal strength and tone. Normal symmetric reflexes. Normal coordination and gait  EKG not performed today  ASSESSMENT AND PLAN:   PVC's (premature ventricular contractions) History of PVCs in the past but she is no longer affected by it.  Pulmonary hypertension, unspecified ([place]) History of mild pulmonary hypertension with an RV systolic pressure of 35  Moderate mitral regurgitation History of mild to moderate mitral regurgitation with mild bileaflet prolapse which we are following by annual 2D echo is most recently performed 07/27/2019.  She does have normal LV size and function with diastolic dysfunction.      Tanya Harp MD FACP,FACC,FAHA, [facility] 08/03/2019 3:13 PM

## 2019-08-03 NOTE — Patient Instructions (Signed)
Medication Instructions:  Your physician recommends that you continue on your current medications as directed. Please refer to the Current Medication list given to you today.  If you need a refill on your cardiac medications before your next appointment, please call your pharmacy.   Lab work: Lipid and Hepatic Function If you have labs (blood work) drawn today and your tests are completely normal, you will receive your results only by: MyChart Message (if you have MyChart) OR A paper copy in the mail If you have any lab test that is abnormal or we need to change your treatment, we will call you to review the results.  Testing/Procedures: Your physician has requested that you have an echocardiogram in [place]. Echocardiography is a painless test that uses sound waves to create images of your heart. It provides your doctor with information about the size and shape of your heart and how well your heart's chambers and valves are working. This procedure takes approximately one hour. There are no restrictions for this procedure. [place] [address]   Follow-Up: At [facility], you and your health needs are our priority.  As part of our continuing mission to provide you with exceptional heart care, we have created designated Provider Care Teams.  These Care Teams include your primary Cardiologist (physician) and Advanced Practice Providers (APPs -  Physician Assistants and Nurse Practitioners) who all work together to provide you with the care you need, when you need it. You may see Quay Burow, MD or one of the following Advanced Practice Providers on your designated Care Team:    Kerin Ransom, PA-C  [place], [place]  Coletta Memos, [place]  Your physician wants you to follow-up in: 1 YEAR. You will receive a reminder letter in the mail two months in advance. If you don't receive a letter, please call our office to schedule the follow-up appointment.

## 2019-08-03 NOTE — Assessment & Plan Note (Signed)
History of mild to moderate mitral regurgitation with mild bileaflet prolapse which we are following by annual 2D echo is most recently performed 07/27/2019.  She does have normal LV size and function with diastolic dysfunction.

## 2019-08-03 NOTE — Assessment & Plan Note (Signed)
History of PVCs in the past but she is no longer affected by it.

## 2019-08-03 NOTE — Assessment & Plan Note (Signed)
History of mild pulmonary hypertension with an RV systolic pressure of 35

## 2019-08-11 DIAGNOSIS — I272 Pulmonary hypertension, unspecified: Secondary | ICD-10-CM | POA: Diagnosis not present

## 2019-08-11 DIAGNOSIS — Z0189 Encounter for other specified special examinations: Secondary | ICD-10-CM | POA: Diagnosis not present

## 2019-08-11 DIAGNOSIS — E7849 Other hyperlipidemia: Secondary | ICD-10-CM | POA: Diagnosis not present

## 2019-08-11 DIAGNOSIS — I348 Other nonrheumatic mitral valve disorders: Secondary | ICD-10-CM | POA: Diagnosis not present

## 2019-08-18 ENCOUNTER — Other Ambulatory Visit: Payer: Self-pay | Admitting: Family Medicine

## 2019-10-05 DIAGNOSIS — Z981 Arthrodesis status: Secondary | ICD-10-CM | POA: Diagnosis not present

## 2019-10-05 DIAGNOSIS — M545 Low back pain: Secondary | ICD-10-CM | POA: Diagnosis not present

## 2019-10-08 DIAGNOSIS — Z4689 Encounter for fitting and adjustment of other specified devices: Secondary | ICD-10-CM | POA: Diagnosis not present

## 2019-10-08 DIAGNOSIS — K469 Unspecified abdominal hernia without obstruction or gangrene: Secondary | ICD-10-CM | POA: Diagnosis not present

## 2019-10-11 DIAGNOSIS — R293 Abnormal posture: Secondary | ICD-10-CM | POA: Diagnosis not present

## 2019-10-11 DIAGNOSIS — M48061 Spinal stenosis, lumbar region without neurogenic claudication: Secondary | ICD-10-CM | POA: Diagnosis not present

## 2019-10-11 DIAGNOSIS — M545 Low back pain: Secondary | ICD-10-CM | POA: Diagnosis not present

## 2019-10-11 DIAGNOSIS — M4125 Other idiopathic scoliosis, thoracolumbar region: Secondary | ICD-10-CM | POA: Diagnosis not present

## 2019-10-12 ENCOUNTER — Other Ambulatory Visit: Payer: Self-pay | Admitting: Family Medicine

## 2019-10-13 DIAGNOSIS — R293 Abnormal posture: Secondary | ICD-10-CM | POA: Diagnosis not present

## 2019-10-13 DIAGNOSIS — M545 Low back pain: Secondary | ICD-10-CM | POA: Diagnosis not present

## 2019-10-13 DIAGNOSIS — Z23 Encounter for immunization: Secondary | ICD-10-CM | POA: Diagnosis not present

## 2019-10-13 DIAGNOSIS — M4125 Other idiopathic scoliosis, thoracolumbar region: Secondary | ICD-10-CM | POA: Diagnosis not present

## 2019-10-13 DIAGNOSIS — M48061 Spinal stenosis, lumbar region without neurogenic claudication: Secondary | ICD-10-CM | POA: Diagnosis not present

## 2019-10-14 DIAGNOSIS — M4125 Other idiopathic scoliosis, thoracolumbar region: Secondary | ICD-10-CM | POA: Diagnosis not present

## 2019-10-14 DIAGNOSIS — R293 Abnormal posture: Secondary | ICD-10-CM | POA: Diagnosis not present

## 2019-10-14 DIAGNOSIS — M48061 Spinal stenosis, lumbar region without neurogenic claudication: Secondary | ICD-10-CM | POA: Diagnosis not present

## 2019-10-14 DIAGNOSIS — M545 Low back pain: Secondary | ICD-10-CM | POA: Diagnosis not present

## 2019-10-18 DIAGNOSIS — R293 Abnormal posture: Secondary | ICD-10-CM | POA: Diagnosis not present

## 2019-10-18 DIAGNOSIS — M48061 Spinal stenosis, lumbar region without neurogenic claudication: Secondary | ICD-10-CM | POA: Diagnosis not present

## 2019-10-18 DIAGNOSIS — M545 Low back pain: Secondary | ICD-10-CM | POA: Diagnosis not present

## 2019-10-18 DIAGNOSIS — M4125 Other idiopathic scoliosis, thoracolumbar region: Secondary | ICD-10-CM | POA: Diagnosis not present

## 2019-10-19 ENCOUNTER — Other Ambulatory Visit: Payer: Self-pay | Admitting: Family Medicine

## 2019-10-19 DIAGNOSIS — R293 Abnormal posture: Secondary | ICD-10-CM | POA: Diagnosis not present

## 2019-10-19 DIAGNOSIS — M545 Low back pain: Secondary | ICD-10-CM | POA: Diagnosis not present

## 2019-10-19 DIAGNOSIS — M4125 Other idiopathic scoliosis, thoracolumbar region: Secondary | ICD-10-CM | POA: Diagnosis not present

## 2019-10-19 DIAGNOSIS — M48061 Spinal stenosis, lumbar region without neurogenic claudication: Secondary | ICD-10-CM | POA: Diagnosis not present

## 2019-10-21 DIAGNOSIS — M48061 Spinal stenosis, lumbar region without neurogenic claudication: Secondary | ICD-10-CM | POA: Diagnosis not present

## 2019-10-21 DIAGNOSIS — R293 Abnormal posture: Secondary | ICD-10-CM | POA: Diagnosis not present

## 2019-10-21 DIAGNOSIS — M545 Low back pain: Secondary | ICD-10-CM | POA: Diagnosis not present

## 2019-10-21 DIAGNOSIS — M4125 Other idiopathic scoliosis, thoracolumbar region: Secondary | ICD-10-CM | POA: Diagnosis not present

## 2019-10-25 DIAGNOSIS — R293 Abnormal posture: Secondary | ICD-10-CM | POA: Diagnosis not present

## 2019-10-25 DIAGNOSIS — M545 Low back pain: Secondary | ICD-10-CM | POA: Diagnosis not present

## 2019-10-25 DIAGNOSIS — M4125 Other idiopathic scoliosis, thoracolumbar region: Secondary | ICD-10-CM | POA: Diagnosis not present

## 2019-10-25 DIAGNOSIS — M48061 Spinal stenosis, lumbar region without neurogenic claudication: Secondary | ICD-10-CM | POA: Diagnosis not present

## 2019-10-26 DIAGNOSIS — R293 Abnormal posture: Secondary | ICD-10-CM | POA: Diagnosis not present

## 2019-10-26 DIAGNOSIS — M48061 Spinal stenosis, lumbar region without neurogenic claudication: Secondary | ICD-10-CM | POA: Diagnosis not present

## 2019-10-26 DIAGNOSIS — M545 Low back pain: Secondary | ICD-10-CM | POA: Diagnosis not present

## 2019-10-26 DIAGNOSIS — M4125 Other idiopathic scoliosis, thoracolumbar region: Secondary | ICD-10-CM | POA: Diagnosis not present

## 2019-10-29 DIAGNOSIS — M48061 Spinal stenosis, lumbar region without neurogenic claudication: Secondary | ICD-10-CM | POA: Diagnosis not present

## 2019-10-29 DIAGNOSIS — M545 Low back pain: Secondary | ICD-10-CM | POA: Diagnosis not present

## 2019-10-29 DIAGNOSIS — R293 Abnormal posture: Secondary | ICD-10-CM | POA: Diagnosis not present

## 2019-10-29 DIAGNOSIS — M4125 Other idiopathic scoliosis, thoracolumbar region: Secondary | ICD-10-CM | POA: Diagnosis not present

## 2019-11-01 DIAGNOSIS — M4125 Other idiopathic scoliosis, thoracolumbar region: Secondary | ICD-10-CM | POA: Diagnosis not present

## 2019-11-01 DIAGNOSIS — M542 Cervicalgia: Secondary | ICD-10-CM | POA: Diagnosis not present

## 2019-11-01 DIAGNOSIS — M48061 Spinal stenosis, lumbar region without neurogenic claudication: Secondary | ICD-10-CM | POA: Diagnosis not present

## 2019-11-01 DIAGNOSIS — K469 Unspecified abdominal hernia without obstruction or gangrene: Secondary | ICD-10-CM | POA: Diagnosis not present

## 2019-11-01 DIAGNOSIS — Z4689 Encounter for fitting and adjustment of other specified devices: Secondary | ICD-10-CM | POA: Diagnosis not present

## 2019-11-01 DIAGNOSIS — R293 Abnormal posture: Secondary | ICD-10-CM | POA: Diagnosis not present

## 2019-11-01 DIAGNOSIS — M545 Low back pain: Secondary | ICD-10-CM | POA: Diagnosis not present

## 2019-11-03 DIAGNOSIS — M48061 Spinal stenosis, lumbar region without neurogenic claudication: Secondary | ICD-10-CM | POA: Diagnosis not present

## 2019-11-03 DIAGNOSIS — M545 Low back pain: Secondary | ICD-10-CM | POA: Diagnosis not present

## 2019-11-03 DIAGNOSIS — M4125 Other idiopathic scoliosis, thoracolumbar region: Secondary | ICD-10-CM | POA: Diagnosis not present

## 2019-11-03 DIAGNOSIS — R293 Abnormal posture: Secondary | ICD-10-CM | POA: Diagnosis not present

## 2019-11-03 DIAGNOSIS — M542 Cervicalgia: Secondary | ICD-10-CM | POA: Diagnosis not present

## 2019-11-05 DIAGNOSIS — M545 Low back pain: Secondary | ICD-10-CM | POA: Diagnosis not present

## 2019-11-05 DIAGNOSIS — M4125 Other idiopathic scoliosis, thoracolumbar region: Secondary | ICD-10-CM | POA: Diagnosis not present

## 2019-11-05 DIAGNOSIS — M542 Cervicalgia: Secondary | ICD-10-CM | POA: Diagnosis not present

## 2019-11-05 DIAGNOSIS — R293 Abnormal posture: Secondary | ICD-10-CM | POA: Diagnosis not present

## 2019-11-05 DIAGNOSIS — M48061 Spinal stenosis, lumbar region without neurogenic claudication: Secondary | ICD-10-CM | POA: Diagnosis not present

## 2019-11-08 DIAGNOSIS — R293 Abnormal posture: Secondary | ICD-10-CM | POA: Diagnosis not present

## 2019-11-08 DIAGNOSIS — M4125 Other idiopathic scoliosis, thoracolumbar region: Secondary | ICD-10-CM | POA: Diagnosis not present

## 2019-11-08 DIAGNOSIS — M48061 Spinal stenosis, lumbar region without neurogenic claudication: Secondary | ICD-10-CM | POA: Diagnosis not present

## 2019-11-08 DIAGNOSIS — M545 Low back pain: Secondary | ICD-10-CM | POA: Diagnosis not present

## 2019-11-08 DIAGNOSIS — M542 Cervicalgia: Secondary | ICD-10-CM | POA: Diagnosis not present

## 2019-11-09 DIAGNOSIS — Z23 Encounter for immunization: Secondary | ICD-10-CM | POA: Diagnosis not present

## 2019-11-15 DIAGNOSIS — R293 Abnormal posture: Secondary | ICD-10-CM | POA: Diagnosis not present

## 2019-11-15 DIAGNOSIS — M542 Cervicalgia: Secondary | ICD-10-CM | POA: Diagnosis not present

## 2019-11-15 DIAGNOSIS — M4125 Other idiopathic scoliosis, thoracolumbar region: Secondary | ICD-10-CM | POA: Diagnosis not present

## 2019-11-15 DIAGNOSIS — M545 Low back pain: Secondary | ICD-10-CM | POA: Diagnosis not present

## 2019-11-15 DIAGNOSIS — M48061 Spinal stenosis, lumbar region without neurogenic claudication: Secondary | ICD-10-CM | POA: Diagnosis not present

## 2019-11-17 DIAGNOSIS — M545 Low back pain: Secondary | ICD-10-CM | POA: Diagnosis not present

## 2019-11-17 DIAGNOSIS — M4125 Other idiopathic scoliosis, thoracolumbar region: Secondary | ICD-10-CM | POA: Diagnosis not present

## 2019-11-17 DIAGNOSIS — M542 Cervicalgia: Secondary | ICD-10-CM | POA: Diagnosis not present

## 2019-11-17 DIAGNOSIS — M48061 Spinal stenosis, lumbar region without neurogenic claudication: Secondary | ICD-10-CM | POA: Diagnosis not present

## 2019-11-17 DIAGNOSIS — R293 Abnormal posture: Secondary | ICD-10-CM | POA: Diagnosis not present

## 2019-11-19 DIAGNOSIS — M4125 Other idiopathic scoliosis, thoracolumbar region: Secondary | ICD-10-CM | POA: Diagnosis not present

## 2019-11-19 DIAGNOSIS — R293 Abnormal posture: Secondary | ICD-10-CM | POA: Diagnosis not present

## 2019-11-19 DIAGNOSIS — M542 Cervicalgia: Secondary | ICD-10-CM | POA: Diagnosis not present

## 2019-11-19 DIAGNOSIS — M48061 Spinal stenosis, lumbar region without neurogenic claudication: Secondary | ICD-10-CM | POA: Diagnosis not present

## 2019-11-19 DIAGNOSIS — M545 Low back pain: Secondary | ICD-10-CM | POA: Diagnosis not present

## 2019-11-23 DIAGNOSIS — H35071 Retinal telangiectasis, right eye: Secondary | ICD-10-CM | POA: Diagnosis not present

## 2019-11-23 DIAGNOSIS — H35371 Puckering of macula, right eye: Secondary | ICD-10-CM | POA: Diagnosis not present

## 2019-11-23 DIAGNOSIS — H353131 Nonexudative age-related macular degeneration, bilateral, early dry stage: Secondary | ICD-10-CM | POA: Diagnosis not present

## 2019-11-23 DIAGNOSIS — K469 Unspecified abdominal hernia without obstruction or gangrene: Secondary | ICD-10-CM | POA: Diagnosis not present

## 2019-11-23 DIAGNOSIS — H43813 Vitreous degeneration, bilateral: Secondary | ICD-10-CM | POA: Diagnosis not present

## 2019-11-30 DIAGNOSIS — H35071 Retinal telangiectasis, right eye: Secondary | ICD-10-CM | POA: Diagnosis not present

## 2019-11-30 DIAGNOSIS — L600 Ingrowing nail: Secondary | ICD-10-CM | POA: Diagnosis not present

## 2019-11-30 DIAGNOSIS — H35413 Lattice degeneration of retina, bilateral: Secondary | ICD-10-CM | POA: Diagnosis not present

## 2019-11-30 DIAGNOSIS — H4423 Degenerative myopia, bilateral: Secondary | ICD-10-CM | POA: Diagnosis not present

## 2019-11-30 DIAGNOSIS — G4701 Insomnia due to medical condition: Secondary | ICD-10-CM | POA: Diagnosis not present

## 2019-11-30 DIAGNOSIS — R0683 Snoring: Secondary | ICD-10-CM | POA: Diagnosis not present

## 2019-11-30 DIAGNOSIS — H3500 Unspecified background retinopathy: Secondary | ICD-10-CM | POA: Diagnosis not present

## 2019-11-30 DIAGNOSIS — H353131 Nonexudative age-related macular degeneration, bilateral, early dry stage: Secondary | ICD-10-CM | POA: Diagnosis not present

## 2019-12-09 DIAGNOSIS — K469 Unspecified abdominal hernia without obstruction or gangrene: Secondary | ICD-10-CM | POA: Diagnosis not present

## 2019-12-09 DIAGNOSIS — Z4689 Encounter for fitting and adjustment of other specified devices: Secondary | ICD-10-CM | POA: Diagnosis not present

## 2019-12-17 DIAGNOSIS — K469 Unspecified abdominal hernia without obstruction or gangrene: Secondary | ICD-10-CM | POA: Diagnosis not present

## 2019-12-23 DIAGNOSIS — G4719 Other hypersomnia: Secondary | ICD-10-CM | POA: Diagnosis not present

## 2019-12-23 DIAGNOSIS — R0683 Snoring: Secondary | ICD-10-CM | POA: Diagnosis not present

## 2019-12-29 DIAGNOSIS — Z0189 Encounter for other specified special examinations: Secondary | ICD-10-CM | POA: Diagnosis not present

## 2019-12-29 DIAGNOSIS — D692 Other nonthrombocytopenic purpura: Secondary | ICD-10-CM | POA: Diagnosis not present

## 2019-12-29 DIAGNOSIS — D696 Thrombocytopenia, unspecified: Secondary | ICD-10-CM | POA: Diagnosis not present

## 2019-12-29 DIAGNOSIS — F329 Major depressive disorder, single episode, unspecified: Secondary | ICD-10-CM | POA: Diagnosis not present

## 2019-12-29 DIAGNOSIS — G4700 Insomnia, unspecified: Secondary | ICD-10-CM | POA: Diagnosis not present

## 2019-12-30 DIAGNOSIS — L600 Ingrowing nail: Secondary | ICD-10-CM | POA: Diagnosis not present

## 2019-12-30 DIAGNOSIS — I272 Pulmonary hypertension, unspecified: Secondary | ICD-10-CM | POA: Diagnosis not present

## 2019-12-30 DIAGNOSIS — D696 Thrombocytopenia, unspecified: Secondary | ICD-10-CM | POA: Diagnosis not present

## 2019-12-30 DIAGNOSIS — E7849 Other hyperlipidemia: Secondary | ICD-10-CM | POA: Diagnosis not present

## 2020-01-10 DIAGNOSIS — Z20822 Contact with and (suspected) exposure to covid-19: Secondary | ICD-10-CM | POA: Diagnosis not present

## 2020-01-10 DIAGNOSIS — Z20828 Contact with and (suspected) exposure to other viral communicable diseases: Secondary | ICD-10-CM | POA: Diagnosis not present

## 2020-01-11 ENCOUNTER — Encounter (INDEPENDENT_AMBULATORY_CARE_PROVIDER_SITE_OTHER): Payer: Medicare Other | Admitting: Ophthalmology

## 2020-01-12 ENCOUNTER — Other Ambulatory Visit: Payer: Self-pay

## 2020-01-12 ENCOUNTER — Ambulatory Visit (INDEPENDENT_AMBULATORY_CARE_PROVIDER_SITE_OTHER): Payer: Medicare Other | Admitting: Ophthalmology

## 2020-01-12 ENCOUNTER — Encounter (INDEPENDENT_AMBULATORY_CARE_PROVIDER_SITE_OTHER): Payer: Self-pay | Admitting: Ophthalmology

## 2020-01-12 DIAGNOSIS — H35071 Retinal telangiectasis, right eye: Secondary | ICD-10-CM

## 2020-01-12 NOTE — Patient Instructions (Signed)
Macular telangiectasis (MAC-TEL), or parafoveal telangiectasis is a condition of "unknown" cause.  Findings in or near the macula (center of vision) consist of microaneurysms (leaking small capillaries), often with leakage of fluid which in the active phase can impact fine discriminatory vision, and in some cases trigger profound scarring in the macula, with severe permanent vision loss.  Standard treatment is observation and periodic examinations to monitor for treatable complications.   The cause  of this condition is "unknown".  However, the practice of Dr. Zadie Rhine has discovered an association with sleep apnea with its nightly periods of low oxygen in the blood stream (hypoxia), retained carbon dioxide (hypercapnia), associated with transient nocturnal hypertensive episodes.   More recently, some patients also been found to have advanced lung disease, whether asthma or COPD, with similar findings.  Dr. Zadie Rhine has been evaluating the association of sleep apnea, nightly hypoxia, and Macular telangiectasis for over 18 years.  Most patients are found to be noncompliant with sleep apnea therapy or testing in the past.  Resumption of CPAP or similar therapy is strongly recommended if ordered in the past.  Upon review of risk factors or findings positive for sleep apnea, more formal, extensive sleep laboratory or home testing, may be recommended.  Numerous patients, proven to have MAC-TEL, have improved or resolved their ey condition promptly, within weeks, of the use of nighttime oxygen supplementation or continuous positive airway pressure (CPAP).

## 2020-01-12 NOTE — Progress Notes (Signed)
01/12/2020     CHIEF COMPLAINT Patient presents for Retina Follow Up   HISTORY OF PRESENT ILLNESS: Tanya Leon is a 79 y.o. female who presents to the clinic today for:   HPI    Retina Follow Up    Patient presents with  Other.  In both eyes.  Duration of 6 weeks.  Since onset it is stable.          Comments    6 week follow up - OCT OU Patient denies change in vision and overall has no complaints. Patient states she is scheduled for a sleep study on the 29th.       Last edited by Gerda Diss, [place] on 01/12/2020  9:16 AM. (History)      Referring physician: [name], MD [place],  [place] [id number]  HISTORICAL INFORMATION:   Selected notes from the MEDICAL RECORD NUMBER    Lab Results  Component Value Date   HGBA1C 5.0 04/27/2019     CURRENT MEDICATIONS: Current Outpatient Medications (Ophthalmic Drugs)  Medication Sig  . azithromycin (AZASITE) 1 % ophthalmic solution Place 1 drop into both eyes See admin instructions. Apply drop onto each eye lid for 10 days out of each month.  . Brinzolamide-Brimonidine (SIMBRINZA) 1-0.2 % SUSP Place 1 drop into the left eye 2 (two) times a day.  . carboxymethylcellulose (REFRESH PLUS) 0.5 % SOLN Place 1 drop into both eyes 3 (three) times daily as needed (Dry eyes).   . cycloSPORINE (RESTASIS) 0.05 % ophthalmic emulsion Place 1 drop into both eyes 2 (two) times daily.   Marland Kitchen olopatadine (PATANOL) 0.1 % ophthalmic solution INSTILL 1 DROP INTO BOTH EYES TWICE A DAY   No current facility-administered medications for this visit. (Ophthalmic Drugs)   Current Outpatient Medications (Other)  Medication Sig  . aspirin-acetaminophen-caffeine (EXCEDRIN EXTRA STRENGTH) 250-250-65 MG per tablet Take 2 tablets by mouth every 6 (six) hours as needed for headache.   . Azelaic Acid (FINACEA) 15 % cream Apply 1 application topically 2 (two) times daily. After skin is thoroughly washed and patted dry, gently but thoroughly  massage a thin film of azelaic acid cream into the affected area twice daily, in the morning and evening.  . Biotin 5000 MCG TABS Take 5,000 mcg by mouth every evening.  . Calcium-Magnesium 500-250 MG TABS Take 1 tablet by mouth daily.  . Cholecalciferol (VITAMIN D3) 50 MCG (2000 UT) TABS Take 2,000 Units by mouth every evening.  . Ciclopirox 1 % shampoo Apply 1 each topically 2 (two) times a week.   . DULoxetine (CYMBALTA) 60 MG capsule TAKE 1 CAPSULE BY MOUTH EVERY DAY (Patient taking differently: Take 60 mg by mouth daily. )  . estradiol (VIVELLE-DOT) 0.1 MG/24HR patch Place 1 patch onto the skin 2 (two) times a week.   Marland Kitchen EVENING PRIMROSE OIL PO Take 1,300 mg by mouth 2 (two) times daily.  . fluocinonide (LIDEX) 0.05 % external solution Apply 1 application topically 2 (two) times a week. Applied to scalp after shampooing   . fluticasone (FLONASE) 50 MCG/ACT nasal spray SPRAY 2 SPRAYS INTO EACH NOSTRIL EVERY DAY  . Lutein 20 MG TABS Take 20 mg by mouth 2 (two) times daily.   . Multiple Vitamin (MULTIVITAMIN WITH MINERALS) TABS tablet Take 1 tablet by mouth daily.  . Multiple Vitamins-Minerals (PRESERVISION AREDS 2 PO) Take 1 tablet by mouth 2 (two) times daily.  . NONFORMULARY OR COMPOUNDED ITEM [facility]:  Antifungal Nail  Lacquer - Terbinafine 3%, Fluconazole 2%, Tea Tree Oil 5%, Urea 10%, Ibuprofen 2%, in DMSO suspension #26m, apply to affected nails daily at bedtime or twice daily. (Patient taking differently: Apply 1 application topically at bedtime. [facility]:  Antifungal Nail Lacquer - Terbinafine 3%, Fluconazole 2%, Tea Tree Oil 5%, Urea 10%, Ibuprofen 2%, in DMSO suspension #369m apply to affected nails daily at bedtime or twice daily.)  . omega-3 acid ethyl esters (LOVAZA) 1 G capsule Take 1,000 mg by mouth 2 (two) times daily.   . polyethylene glycol powder (GLYCOLAX/MIRALAX) powder Take 17 g by mouth daily.   . pregabalin (LYRICA) 25 MG capsule Take 1 capsule (25 mg  total) by mouth 2 (two) times daily. TAKE 1 CAPSULE IN THE MORNING AND TAKE 1 CAPSULE IN THE EVENING (Patient taking differently: Take 25 mg by mouth 2 (two) times daily. TAKE 3 CAPSULE IN THE MORNING AND TAKE 2 CAPSULE IN THE EVENING)  . tiZANidine (ZANAFLEX) 2 MG tablet Take 1 tablet (2 mg total) by mouth at bedtime as needed.   No current facility-administered medications for this visit. (Other)      REVIEW OF SYSTEMS:    ALLERGIES Allergies  Allergen Reactions  . Crab [Shellfish Allergy] Swelling    Soft shelled crab  . Doxycycline Rash    PAST MEDICAL HISTORY Past Medical History:  Diagnosis Date  . Allergy 1973   Seasonal  . Arthritis   . Blood transfusion without reported diagnosis 08/15/2008   Following hip replacement  . Cancer (HCC)    hx of skin cancer   . Cataract 1998   Cataract surgery both eyes  . Colon polyps    adenomatous  . Complication of anesthesia    " pt stated they gave me too much - kept having to be reminded to breathe   . Constipation    opiod induced associated with back pain  . Depression    hx of   . Diverticulosis   . DIVERTICULOSIS, COLON 01/13/2009  . Glaucoma 05-12-14  . Headache    history of migraines  . Heart murmur    benign, dagnosed around age 62,prophylactic antibiotic  . Low back pain   . Lumbar spondylolysis   . Osteopenia   . Pneumonia    hx of as a baby   . Pulmonary hypertension ([facility]   monitored by cards Dr BeGwenlyn Foundnnually via echo   . PVC's (premature ventricular contractions)    i have them periodically , Dr BeGwenlyn Foundad me cut back on caffeine and that helped   . Rectocele   . Tubular adenoma of colon   . Wears hearing aid in both ears    Past Surgical History:  Procedure Laterality Date  . ABDOMINAL HERNIA REPAIR Right    incisional  . ABDOMINAL HYSTERECTOMY     with ovaries  . BACK SURGERY  2009   L4-L5 with rods  . BIOPSY  11/13/2018   Procedure: BIOPSY;  Surgeon: PyJerene BearsMD;  [facility];  Service: Gastroenterology;;  . BLADDER SUSPENSION N/A 04/09/2017   Procedure: TRANSVAGINAL TAPE (TVT) PROCEDURE;  Surgeon: HoBobbye CharlestonMD;  [facility];  Service: Gynecology;  Laterality: N/A;  . CATARACT EXTRACTION, BILATERAL    . cataract left lens replacement     2016  . COLON SURGERY  2007   Rpr abscessed ruptured diverticuli  . COLONOSCOPY    . COLONOSCOPY N/A 08/29/2017   Procedure: COLONOSCOPY;  Surgeon: PyJerene Bears  MD;  [facility]: [facility];  Service: Gastroenterology;  Laterality: N/A;  . COLONOSCOPY WITH PROPOFOL N/A 11/13/2018   Procedure: COLONOSCOPY WITH PROPOFOL with Methylane Blue Injection;  Surgeon: Jerene Bears, MD;  Location: [facility];  Service: Gastroenterology;  Laterality: N/A;  . CYSTOCELE REPAIR N/A 04/09/2017   Procedure: ANTERIOR REPAIR (CYSTOCELE);  Surgeon: Bobbye Charleston, MD;  Location: [facility];  Service: Gynecology;  Laterality: N/A;  . CYSTOSCOPY N/A 04/09/2017   Procedure: CYSTOSCOPY;  Surgeon: Bobbye Charleston, MD;  Location: [facility];  Service: Gynecology;  Laterality: N/A;  . EYE MUSCLE SURGERY    . FRACTURE SURGERY  11-13-17   L wrist-open reduction internal fixation  . GLAUCOMA SURGERY Left 05/12/14  . HERNIA REPAIR  05-28-11   Hernia result of 2007 colon surg  . incisional ab  05/28/11  . JOINT REPLACEMENT     right hip replacement   . LAPAROSCOPIC RIGHT HEMI COLECTOMY Right 04/30/2019   Procedure: LAPAROSCOPIC  RIGHT HEMI COLECTOMY;  Surgeon: Ileana Roup, MD;  Location: [facility];  Service: General;  Laterality: Right;  . LUMBAR FUSION     L4-L5  . PARTIAL KNEE ARTHROPLASTY Right   . PARTIAL KNEE ARTHROPLASTY Left   . perforated deverticular abscess/laporotomy and colostomy     depression after this  . POLYPECTOMY N/A 08/29/2017   Procedure: POLYPECTOMY;  Surgeon: Jerene Bears, MD;  [facility];  Service: Gastroenterology;  Laterality: N/A;  . POLYPECTOMY  11/13/2018   Procedure: POLYPECTOMY;   Surgeon: Jerene Bears, MD;  [facility]: [facility];  Service: Gastroenterology;;  . SPINE SURGERY  02-16-08   Fusion L4L5  . stribismus eye surgery Right   . take down of colostomy    . tonsillectomly    . TONSILLECTOMY    . TOTAL HIP ARTHROPLASTY Right   . TOTAL KNEE REVISION Left 11/24/2015   Procedure: Conversion from left lateral unicompartmental knee arthroplasty to left total knee arthroplasty;  Surgeon: Mcarthur Rossetti, MD;  Location: [facility];  Service: Orthopedics;  Laterality: Left;  . TUBAL LIGATION  1976    FAMILY HISTORY Family History  Problem Relation Age of Onset  . Alzheimer's disease Father   . Heart disease Father        mitral valve replaced. unknown reason  . Breast cancer Mother        69  . Colon polyps Mother   . Diabetes Mother   . Heart disease Mother 68       CHF, MI 34  . Heart attack Mother        x 2  . Arthritis Mother   . Cancer Mother   . Hearing loss Mother   . Hypertension Mother   . Prostate cancer Brother   . Cancer Brother   . Hearing loss Brother   . Hypertension Brother   . Asthma Paternal Uncle   . Hypertension Sister   . Colon cancer Neg Hx   . Esophageal cancer Neg Hx   . Rectal cancer Neg Hx   . Stomach cancer Neg Hx     SOCIAL HISTORY Social History   Tobacco Use  . Smoking status: Never Smoker  . Smokeless tobacco: Never Used  Substance Use Topics  . Alcohol use: Yes    Alcohol/week: 7.0 standard drinks    Types: 7 Glasses of wine per week    Comment: 1 glass wine with dinner most evenings  . Drug use: No  OPHTHALMIC EXAM:  Base Eye Exam    Visual Acuity (Snellen - Linear)      Right Left   Dist cc 20/25-1 20/50+2   Dist ph cc  NI   Correction: Glasses       Tonometry (Tonopen, 9:25 AM)      Right Left   Pressure 13 14       Neuro/Psych    Oriented x3: Yes   Mood/Affect: Normal       Dilation   No dilation today        Slit Lamp and Fundus Exam    External Exam      Right  Left   External Normal Normal       Slit Lamp Exam      Right Left   Lids/Lashes Normal Normal   Conjunctiva/Sclera White and quiet White and quiet   Cornea Clear Clear   Anterior Chamber Deep and quiet Deep and quiet   Iris Round and reactive Round and reactive   Lens Posterior chamber intraocular lens Posterior chamber intraocular lens   Vitreous Normal Normal          IMAGING AND PROCEDURES  Imaging and Procedures for 01/12/20  OCT, Retina - OU - Both Eyes       Right Eye Quality was good. Progression has been stable. Findings include abnormal foveal contour, myopic contour.   Left Eye Findings include normal observations, myopic contour.   Notes OD with the irregular foveal contour temporally.  There is micro-CME apparent although not confirmed.  OS With dry age-related macular degenerative changes of mild.  Perhaps minor intraretinal CME.  These findings are stable and yet presleep lab testing.                ASSESSMENT/PLAN:  No problem-specific Assessment & Plan notes found for this encounter.      ICD-10-CM   1. 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  2.  3.  Ophthalmic Meds Ordered this visit:  No orders of the defined types were placed in this encounter.      Return in about 3 months (around 04/12/2020) for DILATE OU, OCT.  Patient Instructions  Macular telangiectasis (MAC-TEL), or parafoveal telangiectasis is a condition of "unknown" cause.  Findings in or near the macula (center of vision) consist of microaneurysms (leaking small capillaries), often with leakage of fluid which in the active phase can impact fine discriminatory vision, and in some cases trigger profound scarring in the macula, with severe permanent vision loss.  Standard treatment is observation and periodic examinations to monitor for treatable complications.   The cause  of this condition is "unknown".  However, the practice of  Dr. Zadie Rhine has discovered an association with sleep apnea with its nightly periods of low oxygen in the blood stream (hypoxia), retained carbon dioxide (hypercapnia), associated with transient nocturnal hypertensive episodes.   More recently, some patients also been found to have advanced lung disease, whether asthma or COPD, with similar findings.  Dr. Zadie Rhine has been evaluating the association of sleep apnea, nightly hypoxia, and Macular telangiectasis for over 18 years.  Most patients are found to be noncompliant with sleep apnea therapy or testing in the past.  Resumption of CPAP or similar therapy is strongly recommended if ordered in the past.  Upon review of risk factors or findings positive for sleep apnea, more formal, extensive sleep laboratory or home testing, may be recommended.  Numerous patients, proven to have MAC-TEL, have improved or resolved their ey condition promptly, within weeks, of the use of nighttime oxygen supplementation or continuous positive airway pressure (CPAP).    Explained the diagnoses, plan, and follow up with the patient and they expressed understanding.  Patient expressed understanding of the importance of proper follow up care.   Clent Demark Sheppard Luckenbach M.D. Diseases & Surgery of the Retina and Vitreous [facility] 01/12/20     Abbreviations: M myopia (nearsighted); A astigmatism; H hyperopia (farsighted); P presbyopia; Mrx spectacle prescription;  CTL contact lenses; OD right eye; OS left eye; OU both eyes  XT exotropia; ET esotropia; PEK punctate epithelial keratitis; PEE punctate epithelial erosions; DES dry eye syndrome; MGD meibomian gland dysfunction; ATs artificial tears; PFAT's preservative free artificial tears; [place] nuclear sclerotic cataract; PSC posterior subcapsular cataract; ERM epi-retinal membrane; PVD posterior vitreous detachment; RD retinal detachment; DM diabetes mellitus; DR diabetic retinopathy; NPDR non-proliferative diabetic  retinopathy; PDR proliferative diabetic retinopathy; CSME clinically significant macular edema; DME diabetic macular edema; dbh dot blot hemorrhages; CWS cotton wool spot; POAG primary open angle glaucoma; C/D cup-to-disc ratio; HVF humphrey visual field; GVF goldmann visual field; OCT optical coherence tomography; IOP intraocular pressure; BRVO Branch retinal vein occlusion; CRVO central retinal vein occlusion; CRAO central retinal artery occlusion; BRAO branch retinal artery occlusion; RT retinal tear; SB scleral buckle; PPV pars plana vitrectomy; VH Vitreous hemorrhage; PRP panretinal laser photocoagulation; IVK intravitreal kenalog; VMT vitreomacular traction; MH Macular hole;  NVD neovascularization of the disc; NVE neovascularization elsewhere; AREDS age related eye disease study; ARMD age related macular degeneration; POAG primary open angle glaucoma; EBMD epithelial/anterior basement membrane dystrophy; ACIOL anterior chamber intraocular lens; IOL intraocular lens; PCIOL posterior chamber intraocular lens; Phaco/IOL phacoemulsification with intraocular lens placement; [place] photorefractive keratectomy; LASIK laser assisted in situ keratomileusis; HTN hypertension; DM diabetes mellitus; COPD chronic obstructive pulmonary disease

## 2020-01-13 ENCOUNTER — Other Ambulatory Visit: Payer: Self-pay | Admitting: Family Medicine

## 2020-01-13 DIAGNOSIS — K469 Unspecified abdominal hernia without obstruction or gangrene: Secondary | ICD-10-CM | POA: Diagnosis not present

## 2020-01-13 DIAGNOSIS — N813 Complete uterovaginal prolapse: Secondary | ICD-10-CM | POA: Diagnosis not present

## 2020-01-13 DIAGNOSIS — N815 Vaginal enterocele: Secondary | ICD-10-CM | POA: Diagnosis not present

## 2020-01-13 DIAGNOSIS — N816 Rectocele: Secondary | ICD-10-CM | POA: Diagnosis not present

## 2020-01-13 DIAGNOSIS — Z90710 Acquired absence of both cervix and uterus: Secondary | ICD-10-CM | POA: Diagnosis not present

## 2020-01-13 HISTORY — PX: RECTOCELE REPAIR: SHX761

## 2020-01-20 DIAGNOSIS — H53422 Scotoma of blind spot area, left eye: Secondary | ICD-10-CM | POA: Diagnosis not present

## 2020-01-20 DIAGNOSIS — H401132 Primary open-angle glaucoma, bilateral, moderate stage: Secondary | ICD-10-CM | POA: Diagnosis not present

## 2020-01-25 ENCOUNTER — Other Ambulatory Visit: Payer: Self-pay | Admitting: Family Medicine

## 2020-01-27 DIAGNOSIS — B351 Tinea unguium: Secondary | ICD-10-CM | POA: Diagnosis not present

## 2020-01-27 DIAGNOSIS — L600 Ingrowing nail: Secondary | ICD-10-CM | POA: Diagnosis not present

## 2020-01-27 DIAGNOSIS — G4733 Obstructive sleep apnea (adult) (pediatric): Secondary | ICD-10-CM | POA: Diagnosis not present

## 2020-01-28 DIAGNOSIS — G4733 Obstructive sleep apnea (adult) (pediatric): Secondary | ICD-10-CM | POA: Diagnosis not present

## 2020-02-04 DIAGNOSIS — H1132 Conjunctival hemorrhage, left eye: Secondary | ICD-10-CM | POA: Diagnosis not present

## 2020-02-04 DIAGNOSIS — H02844 Edema of left upper eyelid: Secondary | ICD-10-CM | POA: Diagnosis not present

## 2020-02-04 DIAGNOSIS — S0012XA Contusion of left eyelid and periocular area, initial encounter: Secondary | ICD-10-CM | POA: Diagnosis not present

## 2020-02-04 DIAGNOSIS — R040 Epistaxis: Secondary | ICD-10-CM | POA: Diagnosis not present

## 2020-02-07 DIAGNOSIS — S0012XD Contusion of left eyelid and periocular area, subsequent encounter: Secondary | ICD-10-CM | POA: Diagnosis not present

## 2020-02-14 DIAGNOSIS — H02844 Edema of left upper eyelid: Secondary | ICD-10-CM | POA: Diagnosis not present

## 2020-02-14 DIAGNOSIS — H1132 Conjunctival hemorrhage, left eye: Secondary | ICD-10-CM | POA: Diagnosis not present

## 2020-02-14 DIAGNOSIS — S0012XA Contusion of left eyelid and periocular area, initial encounter: Secondary | ICD-10-CM | POA: Diagnosis not present

## 2020-02-25 ENCOUNTER — Telehealth: Payer: Self-pay | Admitting: Internal Medicine

## 2020-02-25 NOTE — Telephone Encounter (Signed)
Pt is scheduled for a colonoscopy in [place] and inquired whether she will need a hospital colon instead.  Pt was scheduled from recall and it did not specify hosp colon.

## 2020-02-25 NOTE — Telephone Encounter (Signed)
Spoke with patient, pt states that last two colonoscopies were at the hospital but her recall colon has been scheduled in the [facility]. Where would you like pt to have procedure?

## 2020-02-29 NOTE — Telephone Encounter (Signed)
Previous colonoscopies performed in the outpatient hospital setting due to availability for equipment for EMR/larger polypectomy. Given that she is now status post right hemicolectomy, I do not anticipate the need for large polypectomy and thus recommended repeat colonoscopy in the [place]. Please let me know if she has any further questions after receiving this explanation

## 2020-03-01 NOTE — Telephone Encounter (Signed)
Spoke with patient, she is aware that colonoscopy in [place] is fine. Pt reminded of her procedure date and previsit date. Pt advised to call office with any other questions or concerns.

## 2020-03-07 ENCOUNTER — Telehealth: Payer: Self-pay

## 2020-03-07 NOTE — Telephone Encounter (Signed)
-----   Message from Jerene Bears, MD sent at 03/07/2020 11:00 AM EDT ----- No okay to proceed with colon as scheduled Thanks Abigail Butts! ----- Message ----- From: Doristine Church, EOT Sent: 03/07/2020  10:57 AM EDT To: Jerene Bears, MD  Hello sir, I am working on recalls. This patient is scheduled for a colonoscopy. She is 79 years old and you stated on the recall sheet she needed an OV instead. Would you like me for me to reschedule her for an OV? She was scheduled before I received the recall sheets.

## 2020-03-22 ENCOUNTER — Emergency Department (HOSPITAL_BASED_OUTPATIENT_CLINIC_OR_DEPARTMENT_OTHER)
Admission: EM | Admit: 2020-03-22 | Discharge: 2020-03-22 | Disposition: A | Discharge status: Home / Self Care | Payer: Medicare Other | Attending: Emergency Medicine | Admitting: Emergency Medicine

## 2020-03-22 ENCOUNTER — Other Ambulatory Visit: Payer: Self-pay

## 2020-03-22 ENCOUNTER — Emergency Department (HOSPITAL_BASED_OUTPATIENT_CLINIC_OR_DEPARTMENT_OTHER): Payer: Medicare Other

## 2020-03-22 ENCOUNTER — Encounter (HOSPITAL_BASED_OUTPATIENT_CLINIC_OR_DEPARTMENT_OTHER): Payer: Self-pay

## 2020-03-22 DIAGNOSIS — S6991XA Unspecified injury of right wrist, hand and finger(s), initial encounter: Secondary | ICD-10-CM | POA: Diagnosis present

## 2020-03-22 DIAGNOSIS — S0990XA Unspecified injury of head, initial encounter: Secondary | ICD-10-CM | POA: Diagnosis not present

## 2020-03-22 DIAGNOSIS — Y9289 Other specified places as the place of occurrence of the external cause: Secondary | ICD-10-CM | POA: Insufficient documentation

## 2020-03-22 DIAGNOSIS — Y9379 Activity, other specified sports and athletics: Secondary | ICD-10-CM | POA: Diagnosis not present

## 2020-03-22 DIAGNOSIS — S01111A Laceration without foreign body of right eyelid and periocular area, initial encounter: Secondary | ICD-10-CM | POA: Diagnosis not present

## 2020-03-22 DIAGNOSIS — W01198A Fall on same level from slipping, tripping and stumbling with subsequent striking against other object, initial encounter: Secondary | ICD-10-CM | POA: Insufficient documentation

## 2020-03-22 DIAGNOSIS — Y998 Other external cause status: Secondary | ICD-10-CM | POA: Diagnosis not present

## 2020-03-22 DIAGNOSIS — S52541A Smith's fracture of right radius, initial encounter for closed fracture: Secondary | ICD-10-CM

## 2020-03-22 MED ORDER — ACETAMINOPHEN 325 MG PO TABS
650.0000 mg | ORAL_TABLET | Freq: Once | ORAL | Status: AC
  Administered 2020-03-22: 650 mg via ORAL
  Filled 2020-03-22: qty 2

## 2020-03-22 MED ORDER — HYDROCODONE-ACETAMINOPHEN 5-325 MG PO TABS
1.0000 | ORAL_TABLET | Freq: Once | ORAL | Status: AC
  Administered 2020-03-22: 1 via ORAL
  Filled 2020-03-22: qty 1

## 2020-03-22 MED ORDER — LIDOCAINE-EPINEPHRINE (PF) 1 %-1:200000 IJ SOLN
10.0000 mL | Freq: Once | INTRAMUSCULAR | Status: AC
  Administered 2020-03-22: 10 mL
  Filled 2020-03-22: qty 30

## 2020-03-22 MED ORDER — MORPHINE SULFATE 15 MG PO TABS: 7.5000 mg | ORAL_TABLET | ORAL | 0 refills | Status: DC | PRN

## 2020-03-22 MED ORDER — LIDOCAINE-EPINEPHRINE (PF) 2 %-1:200000 IJ SOLN: 10.0000 mL | Freq: Once | INTRAMUSCULAR | Status: DC

## 2020-03-22 NOTE — Discharge Instructions (Signed)
Take 4 over the counter ibuprofen tablets 3 times a day or 2 over-the-counter naproxen tablets twice a day for pain. Also take tylenol 1000mg (2 extra strength) four times a day.   Then take the pain medicine if you feel like you need it. Narcotics do not help with the pain, they only make you care about it less.  You can become addicted to this, people may break into your house to steal it.  It will constipate you.  If you drive under the influence of this medicine you can get a DUI.     Please return for redness drainage or swelling from your laceration.  The stitches should dissolve on their own between day 3 and 5.  If it is still there after day 5 you can gently plug it with tweezers.  You can apply Vaseline bacitracin or Neosporin polymycin to the area as you need to.  If you want to clean it you can use the half water and half hydrogen peroxide solution on the tip of a Q-tip.  This can get wet but no scrubbing.  Please not put it under water.  Please follow-up with a hand surgeon in the office.  Try and elevate this above the level of your heart as best you can.  Please return for worsening pain to the arm or if you have numbness or tingling to your fingers.

## 2020-03-22 NOTE — ED Provider Notes (Signed)
[facility] Provider Note   CSN: [id number] Arrival date & time: 03/22/20  1056     History Chief Complaint  Patient presents with  . Laceration  . Arm Injury    Tanya Leon is a 79 y.o. female.  79 yo F with a chief complaint of a fall.  Patient was playing pickle ball and when she stepped on the ball and she fell and landed on her right wrist and then struck her right head against the ground.  She denies loss of consciousness.  Denies injury to the neck or the chest abdomen or the lower extremities.  She denies any change in vision to her right eye.  The history is provided by the patient and the spouse.  Laceration Associated symptoms: no fever   Arm Injury Associated symptoms: no fever   Injury This is a new problem. The current episode started less than 1 hour ago. The problem occurs constantly. The problem has not changed since onset.Associated symptoms include headaches. Pertinent negatives include no chest pain, no abdominal pain and no shortness of breath. The symptoms are aggravated by bending and twisting. Nothing relieves the symptoms. She has tried nothing for the symptoms. The treatment provided no relief.       Past Medical History:  Diagnosis Date  . Allergy 1973   Seasonal  . Arthritis   . Blood transfusion without reported diagnosis 08/15/2008   Following hip replacement  . Cancer (HCC)    hx of skin cancer   . Cataract 1998   Cataract surgery both eyes  . Colon polyps    adenomatous  . Complication of anesthesia    " pt stated they gave me too much - kept having to be reminded to breathe   . Constipation    opiod induced associated with back pain  . Depression    hx of   . Diverticulosis   . DIVERTICULOSIS, COLON 01/13/2009  . Glaucoma 05-12-14  . Headache    history of migraines  . Heart murmur    benign, dagnosed around age 66,prophylactic antibiotic  . Low back pain   . Lumbar spondylolysis   . Osteopenia    . Pneumonia    hx of as a baby   . Pulmonary hypertension ([place])    monitored by cards Dr Gwenlyn Found annually via echo   . PVC's (premature ventricular contractions)    i have them periodically , Dr Gwenlyn Found had me cut back on caffeine and that helped   . Rectocele   . Tubular adenoma of colon   . Wears hearing aid in both ears     Patient Active Problem List   Diagnosis Date Noted  . Colon polyp 04/30/2019  . Moderate mitral regurgitation 07/22/2018  . Senile purpura ([place]) 07/01/2018  . Poor posture 02/02/2018  . History of adenomatous polyp of colon   . Pulmonary hypertension, unspecified ([place]) 07/01/2017  . Cervical arthritis 06/30/2017  . PVC's (premature ventricular contractions) 03/26/2017  . 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 Thrombocytopenia ([place]) 05/03/2015  . Basal cell carcinoma 07/07/2014  . 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 Glaucoma 06/01/2014  . Depression 06/01/2014  . 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 COLONIC POLYPS 01/13/2009  . 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 SPINAL STENOSIS, LUMBAR 01/25/2008  . INSOMNIA, CHRONIC 07/22/2007  . Allergic rhinitis 05/21/2007  . Osteopenia 02/26/2007    Past Surgical History:  Procedure Laterality Date  .  ABDOMINAL HERNIA REPAIR Right    incisional  . ABDOMINAL HYSTERECTOMY     with ovaries  . BACK SURGERY  2009   L4-L5 with rods  . BIOPSY  11/13/2018   Procedure: BIOPSY;  Surgeon: Jerene Bears, MD;  Location: [facility];  Service: Gastroenterology;;  . BLADDER SUSPENSION N/A 04/09/2017   Procedure: TRANSVAGINAL TAPE (TVT) PROCEDURE;  Surgeon: Bobbye Charleston, MD;  Location: [facility];  Service: Gynecology;  Laterality: N/A;  . CATARACT EXTRACTION, BILATERAL    . cataract left lens replacement     2016  . COLON SURGERY  2007   Rpr abscessed ruptured diverticuli  . COLONOSCOPY    . COLONOSCOPY N/A 08/29/2017   Procedure: COLONOSCOPY;  Surgeon: Jerene Bears,  MD;  Location: [facility];  Service: Gastroenterology;  Laterality: N/A;  . COLONOSCOPY WITH PROPOFOL N/A 11/13/2018   Procedure: COLONOSCOPY WITH PROPOFOL with Methylane Blue Injection;  Surgeon: Jerene Bears, MD;  Location: [facility];  Service: Gastroenterology;  Laterality: N/A;  . CYSTOCELE REPAIR N/A 04/09/2017   Procedure: ANTERIOR REPAIR (CYSTOCELE);  Surgeon: Bobbye Charleston, MD;  Location: [facility];  Service: Gynecology;  Laterality: N/A;  . CYSTOSCOPY N/A 04/09/2017   Procedure: CYSTOSCOPY;  Surgeon: Bobbye Charleston, MD;  Location: [facility];  Service: Gynecology;  Laterality: N/A;  . EYE MUSCLE SURGERY    . FRACTURE SURGERY  11-13-17   L wrist-open reduction internal fixation  . GLAUCOMA SURGERY Left 05/12/14  . HERNIA REPAIR  05-28-11   Hernia result of 2007 colon surg  . incisional ab  05/28/11  . JOINT REPLACEMENT     right hip replacement   . LAPAROSCOPIC RIGHT HEMI COLECTOMY Right 04/30/2019   Procedure: LAPAROSCOPIC  RIGHT HEMI COLECTOMY;  Surgeon: Ileana Roup, MD;  Location: [facility];  Service: General;  Laterality: Right;  . LUMBAR FUSION     L4-L5  . PARTIAL KNEE ARTHROPLASTY Right   . PARTIAL KNEE ARTHROPLASTY Left   . perforated deverticular abscess/laporotomy and colostomy     depression after this  . POLYPECTOMY N/A 08/29/2017   Procedure: POLYPECTOMY;  Surgeon: Jerene Bears, MD;  Location: [facility];  Service: Gastroenterology;  Laterality: N/A;  . POLYPECTOMY  11/13/2018   Procedure: POLYPECTOMY;  Surgeon: Jerene Bears, MD;  Location: [facility];  Service: Gastroenterology;;  . SPINE SURGERY  02-16-08   Fusion L4L5  . stribismus eye surgery Right   . take down of colostomy    . tonsillectomly    . TONSILLECTOMY    . TOTAL HIP ARTHROPLASTY Right   . TOTAL KNEE REVISION Left 11/24/2015   Procedure: Conversion from left lateral unicompartmental knee arthroplasty to left total knee arthroplasty;  Surgeon: Mcarthur Rossetti, MD;  Location: [facility];  Service: Orthopedics;  Laterality: Left;  . TUBAL LIGATION  1976     OB History   No obstetric history on file.     Family History  Problem Relation Age of Onset  . Alzheimer's disease Father   . Heart disease Father        mitral valve replaced. unknown reason  . Breast cancer Mother        43  . Colon polyps Mother   . Diabetes Mother   . Heart disease Mother 46       CHF, MI 68  . Heart attack Mother        x 2  . Arthritis Mother   . Cancer Mother   . Hearing loss  Mother   . Hypertension Mother   . Prostate cancer Brother   . Cancer Brother   . Hearing loss Brother   . Hypertension Brother   . Asthma Paternal Uncle   . Hypertension Sister   . Colon cancer Neg Hx   . Esophageal cancer Neg Hx   . Rectal cancer Neg Hx   . Stomach cancer Neg Hx     Social History   Tobacco Use  . Smoking status: Never Smoker  . Smokeless tobacco: Never Used  Vaping Use  . Vaping Use: Never used  Substance Use Topics  . Alcohol use: Yes    Alcohol/week: 7.0 standard drinks    Types: 7 Glasses of wine per week    Comment: 1 glass wine with dinner most evenings  . Drug use: No    Home Medications Prior to Admission medications   Medication Sig Start Date End Date Taking? Authorizing Provider  aspirin-acetaminophen-caffeine (EXCEDRIN EXTRA STRENGTH) [phone] MG per tablet Take 2 tablets by mouth every 6 (six) hours as needed for headache.     [provider]  Azelaic Acid (FINACEA) 15 % cream Apply 1 application topically 2 (two) times daily. After skin is thoroughly washed and patted dry, gently but thoroughly massage a thin film of azelaic acid cream into the affected area twice daily, in the morning and evening.    [provider]  azithromycin (AZASITE) 1 % ophthalmic solution Place 1 drop into both eyes See admin instructions. Apply drop onto each eye lid for 10 days out of each month.    [provider]  Biotin 5000 MCG TABS Take 5,000  mcg by mouth every evening.    [provider]  Brinzolamide-Brimonidine [facility]) 1-0.2 % SUSP Place 1 drop into the left eye 2 (two) times a day.    [provider]  Calcium-Magnesium 500-250 MG TABS Take 1 tablet by mouth daily.    [provider]  carboxymethylcellulose (REFRESH PLUS) 0.5 % SOLN Place 1 drop into both eyes 3 (three) times daily as needed (Dry eyes).     [provider]  Cholecalciferol (VITAMIN D3) 50 MCG (2000 UT) TABS Take 2,000 Units by mouth every evening.    [provider]  Ciclopirox 1 % shampoo Apply 1 each topically 2 (two) times a week.  07/05/11   [provider]  cycloSPORINE (RESTASIS) 0.05 % ophthalmic emulsion Place 1 drop into both eyes 2 (two) times daily.     [provider]  DULoxetine (CYMBALTA) 60 MG capsule TAKE 1 CAPSULE BY MOUTH EVERY DAY Patient taking differently: Take 60 mg by mouth daily.  10/15/18   Marin Olp, MD  estradiol (VIVELLE-DOT) 0.1 MG/24HR patch Place 1 patch onto the skin 2 (two) times a week.     [provider]  EVENING PRIMROSE OIL PO Take 1,300 mg by mouth 2 (two) times daily.    [provider]  fluocinonide (LIDEX) 0.05 % external solution Apply 1 application topically 2 (two) times a week. Applied to scalp after shampooing     [provider]  fluticasone (FLONASE) 50 MCG/ACT nasal spray SPRAY 2 SPRAYS INTO EACH NOSTRIL EVERY DAY 01/13/20   Marin Olp, MD  Lutein 20 MG TABS Take 20 mg by mouth 2 (two) times daily.     [provider]  morphine (MSIR) 15 MG tablet Take 0.5 tablets (7.5 mg total) by mouth every 4 (four) hours as needed for severe pain. 03/22/20  Deno Etienne, DO  Multiple Vitamin (MULTIVITAMIN WITH MINERALS) TABS tablet Take 1 tablet by mouth daily.    [provider]  Multiple Vitamins-Minerals (PRESERVISION AREDS 2 PO) Take 1 tablet by mouth 2 (two) times daily.    [provider]    NONFORMULARY OR COMPOUNDED ITEM [facility]:  Antifungal Nail Lacquer - Terbinafine 3%, Fluconazole 2%, Tea Tree Oil 5%, Urea 10%, Ibuprofen 2%, in DMSO suspension #55ml, apply to affected nails daily at bedtime or twice daily. Patient taking differently: Apply 1 application topically at bedtime. [facility]:  Antifungal Nail Lacquer - Terbinafine 3%, Fluconazole 2%, Tea Tree Oil 5%, Urea 10%, Ibuprofen 2%, in DMSO suspension #65ml, apply to affected nails daily at bedtime or twice daily. 08/20/18   Trula Slade, DPM  olopatadine (PATANOL) 0.1 % ophthalmic solution INSTILL 1 DROP INTO BOTH EYES TWICE A DAY 08/18/19   Marin Olp, MD  omega-3 acid ethyl esters (LOVAZA) 1 G capsule Take 1,000 mg by mouth 2 (two) times daily.     [provider]  polyethylene glycol powder (GLYCOLAX/MIRALAX) powder Take 17 g by mouth daily.     [provider]  pregabalin (LYRICA) 25 MG capsule Take 1 capsule (25 mg total) by mouth 2 (two) times daily. TAKE 1 CAPSULE IN THE MORNING AND TAKE 1 CAPSULE IN THE EVENING Patient taking differently: Take 25 mg by mouth 2 (two) times daily. TAKE 3 CAPSULE IN THE MORNING AND TAKE 2 CAPSULE IN THE EVENING 01/26/19   Marin Olp, MD  tiZANidine (ZANAFLEX) 2 MG tablet TAKE 1 TABLET (2 MG TOTAL) BY MOUTH AT BEDTIME AS NEEDED. 01/26/20   Marin Olp, MD    Allergies    Otho Darner allergy] and Doxycycline  Review of Systems   Review of Systems  Constitutional: Negative for chills and fever.  HENT: Negative for congestion and rhinorrhea.   Eyes: Negative for redness and visual disturbance.  Respiratory: Negative for shortness of breath and wheezing.   Cardiovascular: Negative for chest pain and palpitations.  Gastrointestinal: Negative for abdominal pain, nausea and vomiting.  Genitourinary: Negative for dysuria and urgency.  Musculoskeletal: Positive for arthralgias and myalgias.  Skin: Negative for pallor and  wound.  Neurological: Positive for headaches. Negative for dizziness.    Physical Exam Updated Vital Signs BP (!) 156/99 (BP Location: Left Arm)   Pulse 71   Temp 97.7 F (36.5 C) (Oral)   Resp 16   Ht 5\' 2"  (1.575 m)   Wt 62.1 kg   SpO2 95%   BMI 25.06 kg/m   Physical Exam Vitals and nursing note reviewed.  Constitutional:      General: She is not in acute distress.    Appearance: She is well-developed. She is not diaphoretic.  HENT:     Head: Normocephalic.     Comments: Laceration to the right temporal region.  Extraocular motion is intact.  No periorbital rim tenderness.  Stellate laceration approximately 6 cm in length. Eyes:     Pupils: Pupils are equal, round, and reactive to light.  Cardiovascular:     Rate and Rhythm: Normal rate and regular rhythm.     Heart sounds: No murmur heard.  No friction rub. No gallop.   Pulmonary:     Effort: Pulmonary effort is normal.     Breath sounds: No wheezing or rales.  Abdominal:     General: There is no distension.     Palpations: Abdomen is soft.  Tenderness: There is no abdominal tenderness.  Musculoskeletal:        General: Tenderness and deformity present.     Cervical back: Normal range of motion and neck supple.     Comments: Tenderness and deformity to the right distal forearm.  Pulse motor and sensation are intact distally.  Skin:    General: Skin is warm and dry.  Neurological:     Mental Status: She is alert and oriented to person, place, and time.  Psychiatric:        Behavior: Behavior normal.     ED Results / Procedures / Treatments   Labs (all labs ordered are listed, but only abnormal results are displayed) Labs Reviewed - No data to display  EKG None  Radiology DG Forearm Right  Result Date: 03/22/2020 CLINICAL DATA:  Right wrist pain after fall. EXAM: RIGHT FOREARM - 2 VIEW COMPARISON:  None. FINDINGS: Severely displaced and comminuted fracture is seen involving the distal right radius  with intra-articular extension. The ulna is unremarkable. No soft tissue abnormality is noted. IMPRESSION: Severely displaced and comminuted distal right radial fracture with intra-articular extension. Electronically Signed   By: Marijo Conception M.D.   On: 03/22/2020 12:17   DG Wrist Complete Right  Result Date: 03/22/2020 CLINICAL DATA:  Right wrist pain after fall today. EXAM: RIGHT WRIST - COMPLETE 3+ VIEW COMPARISON:  None. FINDINGS: Severely displaced and comminuted fracture is seen involving the distal radius. Intra-articular extension is noted. No soft tissue abnormality is noted. Degenerative changes are seen involving the first carpometacarpal joint as well as the adjacent intercarpal joints. IMPRESSION: Severely displaced and comminuted distal right radial fracture. Electronically Signed   By: Marijo Conception M.D.   On: 03/22/2020 12:16   CT Head Wo Contrast  Result Date: 03/22/2020 CLINICAL DATA:  Fall today with blunt head trauma. Right hand laceration and pain. Initial encounter. EXAM: CT HEAD WITHOUT CONTRAST TECHNIQUE: Contiguous axial images were obtained from the base of the skull through the vertex without intravenous contrast. COMPARISON:  04/05/2009 FINDINGS: Brain: No evidence of acute infarction, hemorrhage, hydrocephalus, extra-axial collection, or mass lesion/mass effect. Moderate diffuse cerebral atrophy shows mild progression since previous study. Mild chronic small vessel disease is also noted. Vascular:  No hyperdense vessel or other acute findings. Skull: No evidence of fracture or other significant bone abnormality. Sinuses/Orbits:  No acute findings. Other: None. IMPRESSION: 1. No acute intracranial abnormality. 2. Moderate cerebral atrophy and chronic small vessel disease. Electronically Signed   By: Marlaine Hind M.D.   On: 03/22/2020 12:23    Procedures Reduction of fracture  Date/Time: 03/22/2020 2:05 PM Performed by: Deno Etienne, DO Authorized by: Deno Etienne, DO   Preparation: Patient was prepped and draped in the usual sterile fashion. Local anesthesia used: yes Anesthesia: hematoma block  Anesthesia: Local anesthesia used: yes Local Anesthetic: lidocaine 1% with epinephrine Anesthetic total: 10 mL  Sedation: Patient sedated: no  Patient tolerance: patient tolerated the procedure well with no immediate complications Comments: Placed in finger traps for about 25 minutes and then some manual displacement distally and posteriorly into the distal radius with improved alignment.  Marland Kitchen.Laceration Repair  Date/Time: 03/22/2020 2:06 PM Performed by: Deno Etienne, DO Authorized by: Deno Etienne, DO   Consent:    Consent obtained:  Verbal   Consent given by:  Patient   Risks discussed:  Infection, pain, poor cosmetic result and poor wound healing   Alternatives discussed:  No treatment and delayed treatment Anesthesia (see  MAR for exact dosages):    Anesthesia method:  Local infiltration   Local anesthetic:  Lidocaine 1% WITH epi Laceration details:    Location:  Face   Face location:  R eyebrow   Length (cm):  6 Repair type:    Repair type:  Intermediate Exploration:    Hemostasis achieved with:  Epinephrine and direct pressure   Wound exploration: entire depth of wound probed and visualized     Wound extent: no nerve damage noted, no underlying fracture noted and no vascular damage noted (\)     Contaminated: no   Treatment:    Area cleansed with:  Saline   Amount of cleaning:  Standard   Irrigation solution:  Sterile saline   Irrigation volume:  20   Irrigation method:  Syringe   Visualized foreign bodies/material removed: no   Skin repair:    Repair method:  Sutures   Suture size:  5-0   Suture material:  Fast-absorbing gut   Suture technique:  Simple interrupted   Number of sutures:  6 Approximation:    Approximation:  Close Post-procedure details:    Dressing:  Open (no dressing)   Patient tolerance of procedure:  Tolerated  well, no immediate complications   (including critical care time)  Medications Ordered in ED Medications  acetaminophen (TYLENOL) tablet 650 mg (650 mg Oral Given 03/22/20 1129)  HYDROcodone-acetaminophen (NORCO/VICODIN) 5-325 MG per tablet 1 tablet (1 tablet Oral Given 03/22/20 1129)  lidocaine-EPINEPHrine (XYLOCAINE-EPINEPHrine) 1 %-1:200000 (PF) injection 10 mL (10 mLs Infiltration Given 03/22/20 1130)    ED Course  I have reviewed the triage vital signs and the nursing notes.  Pertinent labs & imaging results that were available during my care of the patient were reviewed by me and considered in my medical decision making (see chart for details).    MDM Rules/Calculators/A&P                          79 yo F with a chief complaints of a fall.  This happened while she was playing sports she had stepped on a ball and fell struck the right side of her head and landed on her right wrist.  Likely distal forearm fracture.  Will obtain a plain film.  She also has a complex laceration to the right temporal region.  Repaired at bedside.  CT the head.  She has no midline C-spine tenderness and is able to rotate her head 45 degrees without pain.  Will not obtain C-spine imaging.  CT scan of the head is negative for acute intracranial pathology.  Distal radius with volar displacement.  This was reduced at bedside after being hung in the finger traps.  Splinted in place.  Will follow up with hand surgery in the office.  2:08 PM:  I have discussed the diagnosis/risks/treatment options with the patient and family and believe the pt to be eligible for discharge home to follow-up with Hand, PCP. We also discussed returning to the ED immediately if new or worsening sx occur. We discussed the sx which are most concerning (e.g., sudden worsening pain, fever, inability to tolerate by mouth) that necessitate immediate return. Medications administered to the patient during their visit and any new prescriptions  provided to the patient are listed below.  Medications given during this visit Medications  acetaminophen (TYLENOL) tablet 650 mg (650 mg Oral Given 03/22/20 1129)  HYDROcodone-acetaminophen (NORCO/VICODIN) 5-325 MG per tablet 1 tablet (1 tablet Oral Given  03/22/20 1129)  lidocaine-EPINEPHrine (XYLOCAINE-EPINEPHrine) 1 %-1:200000 (PF) injection 10 mL (10 mLs Infiltration Given 03/22/20 1130)     The patient appears reasonably screen and/or stabilized for discharge and I doubt any other medical condition or other [facility] requiring further screening, evaluation, or treatment in the ED at this time prior to discharge.   Final Clinical Impression(s) / ED Diagnoses Final diagnoses:  Closed Smith's fracture of right radius, initial encounter    Rx / DC Orders ED Discharge Orders         Ordered    morphine (MSIR) 15 MG tablet  Every 4 hours PRN     Discontinue  Reprint     03/22/20 [place], [place], DO 03/22/20 1408

## 2020-03-22 NOTE — ED Triage Notes (Signed)
Pt arrives via [facility] from [place] living r/t fall today while playing pickle ball today. Pt has laceration to right eyebrow and deformity to right wrist. NAD. Denies LOC, denies blood thinners.

## 2020-03-27 HISTORY — PX: WRIST SURGERY: SHX841

## 2020-04-12 ENCOUNTER — Other Ambulatory Visit: Payer: Self-pay

## 2020-04-12 ENCOUNTER — Ambulatory Visit (INDEPENDENT_AMBULATORY_CARE_PROVIDER_SITE_OTHER): Payer: Medicare Other | Admitting: Ophthalmology

## 2020-04-12 ENCOUNTER — Encounter (INDEPENDENT_AMBULATORY_CARE_PROVIDER_SITE_OTHER): Payer: Self-pay | Admitting: Ophthalmology

## 2020-04-12 DIAGNOSIS — R0683 Snoring: Secondary | ICD-10-CM

## 2020-04-12 DIAGNOSIS — H35071 Retinal telangiectasis, right eye: Secondary | ICD-10-CM

## 2020-04-12 DIAGNOSIS — H35413 Lattice degeneration of retina, bilateral: Secondary | ICD-10-CM

## 2020-04-12 NOTE — Progress Notes (Signed)
04/12/2020     CHIEF COMPLAINT Patient presents for Retina Follow Up   HISTORY OF PRESENT ILLNESS: Tanya Leon is a 79 y.o. female who presents to the clinic today for:   HPI    Retina Follow Up    Patient presents with  Other.  In both eyes.  Duration of 3 months.  Since onset it is stable.          Comments    3 month follow up - OCT OU Patient states that she had a sleep study and was dx with severe sleep apnea; patient was put on BiPAP that she uses every night (except for when she got stitches)       Last edited by Gerda Diss on 04/12/2020 11:14 AM. (History)      Referring physician: [name], MD [place],  [place] [address]  HISTORICAL INFORMATION:   Selected notes from the MEDICAL RECORD NUMBER    Lab Results  Component Value Date   HGBA1C 5.0 04/27/2019     CURRENT MEDICATIONS: Current Outpatient Medications (Ophthalmic Drugs)  Medication Sig  . azithromycin (AZASITE) 1 % ophthalmic solution Place 1 drop into both eyes See admin instructions. Apply drop onto each eye lid for 10 days out of each month.  . Brinzolamide-Brimonidine (SIMBRINZA) 1-0.2 % SUSP Place 1 drop into the left eye 2 (two) times a day.  . carboxymethylcellulose (REFRESH PLUS) 0.5 % SOLN Place 1 drop into both eyes 3 (three) times daily as needed (Dry eyes).   . cycloSPORINE (RESTASIS) 0.05 % ophthalmic emulsion Place 1 drop into both eyes 2 (two) times daily.   Marland Kitchen olopatadine (PATANOL) 0.1 % ophthalmic solution INSTILL 1 DROP INTO BOTH EYES TWICE A DAY   No current facility-administered medications for this visit. (Ophthalmic Drugs)   Current Outpatient Medications (Other)  Medication Sig  . aspirin-acetaminophen-caffeine (EXCEDRIN EXTRA STRENGTH) 250-250-65 MG per tablet Take 2 tablets by mouth every 6 (six) hours as needed for headache.   . Azelaic Acid (FINACEA) 15 % cream Apply 1 application topically 2 (two) times daily. After skin is thoroughly washed and  patted dry, gently but thoroughly massage a thin film of azelaic acid cream into the affected area twice daily, in the morning and evening.  . Biotin 5000 MCG TABS Take 5,000 mcg by mouth every evening.  . Calcium-Magnesium 500-250 MG TABS Take 1 tablet by mouth daily.  . Cholecalciferol (VITAMIN D3) 50 MCG (2000 UT) TABS Take 2,000 Units by mouth every evening.  . Ciclopirox 1 % shampoo Apply 1 each topically 2 (two) times a week.   . DULoxetine (CYMBALTA) 60 MG capsule TAKE 1 CAPSULE BY MOUTH EVERY DAY (Patient taking differently: Take 60 mg by mouth daily. )  . estradiol (VIVELLE-DOT) 0.1 MG/24HR patch Place 1 patch onto the skin 2 (two) times a week.   Marland Kitchen EVENING PRIMROSE OIL PO Take 1,300 mg by mouth 2 (two) times daily.  . fluocinonide (LIDEX) 0.05 % external solution Apply 1 application topically 2 (two) times a week. Applied to scalp after shampooing   . fluticasone (FLONASE) 50 MCG/ACT nasal spray SPRAY 2 SPRAYS INTO EACH NOSTRIL EVERY DAY  . Lutein 20 MG TABS Take 20 mg by mouth 2 (two) times daily.   Marland Kitchen morphine (MSIR) 15 MG tablet Take 0.5 tablets (7.5 mg total) by mouth every 4 (four) hours as needed for severe pain.  . Multiple Vitamin (MULTIVITAMIN WITH MINERALS) TABS tablet Take 1 tablet  by mouth daily.  . Multiple Vitamins-Minerals (PRESERVISION AREDS 2 PO) Take 1 tablet by mouth 2 (two) times daily.  . NONFORMULARY OR COMPOUNDED ITEM [facility]:  Antifungal Nail Lacquer - Terbinafine 3%, Fluconazole 2%, Tea Tree Oil 5%, Urea 10%, Ibuprofen 2%, in DMSO suspension #55m, apply to affected nails daily at bedtime or twice daily. (Patient taking differently: Apply 1 application topically at bedtime. [facility]:  Antifungal Nail Lacquer - Terbinafine 3%, Fluconazole 2%, Tea Tree Oil 5%, Urea 10%, Ibuprofen 2%, in DMSO suspension #325m apply to affected nails daily at bedtime or twice daily.)  . omega-3 acid ethyl esters (LOVAZA) 1 G capsule Take 1,000 mg by mouth 2 (two)  times daily.   . polyethylene glycol powder (GLYCOLAX/MIRALAX) powder Take 17 g by mouth daily.   . pregabalin (LYRICA) 25 MG capsule Take 1 capsule (25 mg total) by mouth 2 (two) times daily. TAKE 1 CAPSULE IN THE MORNING AND TAKE 1 CAPSULE IN THE EVENING (Patient taking differently: Take 25 mg by mouth 2 (two) times daily. TAKE 3 CAPSULE IN THE MORNING AND TAKE 2 CAPSULE IN THE EVENING)  . tiZANidine (ZANAFLEX) 2 MG tablet TAKE 1 TABLET (2 MG TOTAL) BY MOUTH AT BEDTIME AS NEEDED.   No current facility-administered medications for this visit. (Other)      REVIEW OF SYSTEMS:    ALLERGIES Allergies  Allergen Reactions  . Crab [Shellfish Allergy] Swelling    Soft shelled crab  . Doxycycline Rash    PAST MEDICAL HISTORY Past Medical History:  Diagnosis Date  . Allergy 1973   Seasonal  . Arthritis   . Blood transfusion without reported diagnosis 08/15/2008   Following hip replacement  . Cancer (HCC)    hx of skin cancer   . Cataract 1998   Cataract surgery both eyes  . Colon polyps    adenomatous  . Complication of anesthesia    " pt stated they gave me too much - kept having to be reminded to breathe   . Constipation    opiod induced associated with back pain  . Depression    hx of   . Diverticulosis   . DIVERTICULOSIS, COLON 01/13/2009  . Glaucoma 05-12-14  . Headache    history of migraines  . Heart murmur    benign, dagnosed around age 36,prophylactic antibiotic  . Low back pain   . Lumbar spondylolysis   . Osteopenia   . Pneumonia    hx of as a baby   . [place]   monitored by cards Dr BeGwenlyn Foundnnually via echo   . PVC's (premature ventricular contractions)    i have them periodically , Dr BeGwenlyn Foundad me cut back on caffeine and that helped   . Rectocele   . Tubular adenoma of colon   . Wears hearing aid in both ears    Past Surgical History:  Procedure Laterality Date  . ABDOMINAL HERNIA REPAIR Right    incisional  . ABDOMINAL  HYSTERECTOMY     with ovaries  . BACK SURGERY  2009   L4-L5 with rods  . BIOPSY  11/13/2018   Procedure: BIOPSY;  Surgeon: PyJerene BearsMD;  [facility];  Service: Gastroenterology;;  . BLADDER SUSPENSION N/A 04/09/2017   Procedure: TRANSVAGINAL TAPE (TVT) PROCEDURE;  Surgeon: HoBobbye CharlestonMD;  [facility];  Service: Gynecology;  Laterality: N/A;  . CATARACT EXTRACTION, BILATERAL    . cataract left lens replacement     2016  .  COLON SURGERY  2007   Rpr abscessed ruptured diverticuli  . COLONOSCOPY    . COLONOSCOPY N/A 08/29/2017   Procedure: COLONOSCOPY;  Surgeon: Jerene Bears, MD;  [facility];  Service: Gastroenterology;  Laterality: N/A;  . COLONOSCOPY WITH PROPOFOL N/A 11/13/2018   Procedure: COLONOSCOPY WITH PROPOFOL with Methylane Blue Injection;  Surgeon: Jerene Bears, MD;  [facility];  Service: Gastroenterology;  Laterality: N/A;  . CYSTOCELE REPAIR N/A 04/09/2017   Procedure: ANTERIOR REPAIR (CYSTOCELE);  Surgeon: Bobbye Charleston, MD;  Location: [facility];  Service: Gynecology;  Laterality: N/A;  . CYSTOSCOPY N/A 04/09/2017   Procedure: CYSTOSCOPY;  Surgeon: Bobbye Charleston, MD;  Location: [facility];  Service: Gynecology;  Laterality: N/A;  . EYE MUSCLE SURGERY    . FRACTURE SURGERY  11-13-17   L wrist-open reduction internal fixation  . GLAUCOMA SURGERY Left 05/12/14  . HERNIA REPAIR  05-28-11   Hernia result of 2007 colon surg  . incisional ab  05/28/11  . JOINT REPLACEMENT     right hip replacement   . LAPAROSCOPIC RIGHT HEMI COLECTOMY Right 04/30/2019   Procedure: LAPAROSCOPIC  RIGHT HEMI COLECTOMY;  Surgeon: Ileana Roup, MD;  [facility]: [facility];  Service: General;  Laterality: Right;  . LUMBAR FUSION     L4-L5  . PARTIAL KNEE ARTHROPLASTY Right   . PARTIAL KNEE ARTHROPLASTY Left   . perforated deverticular abscess/laporotomy and colostomy     depression after this  . POLYPECTOMY N/A 08/29/2017   Procedure: POLYPECTOMY;   Surgeon: Jerene Bears, MD;  [facility];  Service: Gastroenterology;  Laterality: N/A;  . POLYPECTOMY  11/13/2018   Procedure: POLYPECTOMY;  Surgeon: Jerene Bears, MD;  [facility];  Service: Gastroenterology;;  . SPINE SURGERY  02-16-08   Fusion L4L5  . stribismus eye surgery Right   . take down of colostomy    . tonsillectomly    . TONSILLECTOMY    . TOTAL HIP ARTHROPLASTY Right   . TOTAL KNEE REVISION Left 11/24/2015   Procedure: Conversion from left lateral unicompartmental knee arthroplasty to left total knee arthroplasty;  Surgeon: Mcarthur Rossetti, MD;  Location: [facility];  Service: Orthopedics;  Laterality: Left;  . TUBAL LIGATION  1976    FAMILY HISTORY Family History  Problem Relation Age of Onset  . Alzheimer's disease Father   . Heart disease Father        mitral valve replaced. unknown reason  . Breast cancer Mother        88  . Colon polyps Mother   . Diabetes Mother   . Heart disease Mother 14       CHF, MI 77  . Heart attack Mother        x 2  . Arthritis Mother   . Cancer Mother   . Hearing loss Mother   . Hypertension Mother   . Prostate cancer Brother   . Cancer Brother   . Hearing loss Brother   . Hypertension Brother   . Asthma Paternal Uncle   . Hypertension Sister   . Colon cancer Neg Hx   . Esophageal cancer Neg Hx   . Rectal cancer Neg Hx   . Stomach cancer Neg Hx     SOCIAL HISTORY Social History   Tobacco Use  . Smoking status: Never Smoker  . Smokeless tobacco: Never Used  Vaping Use  . Vaping Use: Never used  Substance Use Topics  . Alcohol use: Yes  Alcohol/week: 7.0 standard drinks    Types: 7 Glasses of wine per week    Comment: 1 glass wine with dinner most evenings  . Drug use: No         OPHTHALMIC EXAM:  Base Eye Exam    Visual Acuity (Snellen - Linear)      Right Left   Dist cc 20/40-1 20/40-1   Dist ph cc 20/25-1 NI       Tonometry (Tonopen, 11:21 AM)      Right Left   Pressure  14 12       Pupils      Pupils Dark Light Shape React APD   Right PERRL 4 3 Round Slow None   Left PERRL 4 3 Round Slow None       Visual Fields (Counting fingers)      Left Right    Full Full       Extraocular Movement      Right Left    Full Full       Neuro/Psych    Oriented x3: Yes   Mood/Affect: Normal       Dilation    Both eyes: 1.0% Mydriacyl, 2.5% Phenylephrine @ 11:21 AM        Slit Lamp and Fundus Exam    External Exam      Right Left   External Normal Normal       Slit Lamp Exam      Right Left   Lids/Lashes Normal Normal   Conjunctiva/Sclera White and quiet White and quiet   Cornea Clear Clear   Anterior Chamber Deep and quiet Deep and quiet   Iris Round and reactive Round and reactive   Lens Posterior chamber intraocular lens Posterior chamber intraocular lens   Anterior Vitreous Normal Normal       Fundus Exam      Right Left   Posterior Vitreous Posterior vitreous detachment Posterior vitreous detachment   Disc Peripapillary atrophy Peripapillary atrophy   C/D Ratio 0.05 0.05   Macula Hard drusen, no exudates, no hemorrhage, Geographic atrophy Hard drusen, no exudates, no hemorrhage, Geographic atrophy   Vessels Normal Normal   Periphery Normal Normal          IMAGING AND PROCEDURES  Imaging and Procedures for 04/12/20  OCT, Retina - OU - Both Eyes       Right Eye Quality was good. Scan locations included subfoveal. Central Foveal Thickness: 309. Progression has improved. Findings include cystoid macular edema.   Left Eye Quality was good. Scan locations included subfoveal. Central Foveal Thickness: 289. Progression has improved.   Notes Perifoveal's intraretinal CME possibly MAC-TEL related, nonetheless not no change now the patient is only on BiPAP for the last 6 weeks.                ASSESSMENT/PLAN:  No problem-specific Assessment & Plan notes found for this encounter.      ICD-10-CM   1. Retinal  telangiectasia of right eye  H35.071 OCT, Retina - OU - Both Eyes  2. Snoring  R06.83   3. Bilateral retinal lattice degeneration  H35.413 OCT, Retina - OU - Both Eyes    1.  Patient underwent sleep testing at [facility].  Patient indeed found to have severe sleep apnea failed a trial of CPAP due to high pressures, and is currently using BiPAP with some effectiveness such that the patient has an improved sense of wellbeing.  2.  No OCT findings  change since prior to her use of BiPAP the last 6 weeks.  3.  Ophthalmic Meds Ordered this visit:  No orders of the defined types were placed in this encounter.      Return in about 3 months (around 07/13/2020) for DILATE OU, OCT.  There are no Patient Instructions on file for this visit.   Explained the diagnoses, plan, and follow up with the patient and they expressed understanding.  Patient expressed understanding of the importance of proper follow up care.   Clent Demark Zedric Deroy M.D. Diseases & Surgery of the Retina and Vitreous Retina & Diabetic [place] 04/12/20     Abbreviations: M myopia (nearsighted); A astigmatism; H hyperopia (farsighted); P presbyopia; Mrx spectacle prescription;  CTL contact lenses; OD right eye; OS left eye; OU both eyes  XT exotropia; ET esotropia; PEK punctate epithelial keratitis; PEE punctate epithelial erosions; DES dry eye syndrome; MGD meibomian gland dysfunction; ATs artificial tears; PFAT's preservative free artificial tears; [place] nuclear sclerotic cataract; PSC posterior subcapsular cataract; ERM epi-retinal membrane; PVD posterior vitreous detachment; RD retinal detachment; DM diabetes mellitus; DR diabetic retinopathy; NPDR non-proliferative diabetic retinopathy; PDR proliferative diabetic retinopathy; CSME clinically significant macular edema; DME diabetic macular edema; dbh dot blot hemorrhages; CWS cotton wool spot; POAG primary open angle glaucoma; C/D cup-to-disc ratio; HVF  humphrey visual field; GVF goldmann visual field; OCT optical coherence tomography; IOP intraocular pressure; BRVO Branch retinal vein occlusion; CRVO central retinal vein occlusion; CRAO central retinal artery occlusion; BRAO branch retinal artery occlusion; RT retinal tear; SB scleral buckle; PPV pars plana vitrectomy; VH Vitreous hemorrhage; PRP panretinal laser photocoagulation; IVK intravitreal kenalog; VMT vitreomacular traction; MH Macular hole;  NVD neovascularization of the disc; NVE neovascularization elsewhere; AREDS age related eye disease study; ARMD age related macular degeneration; POAG primary open angle glaucoma; EBMD epithelial/anterior basement membrane dystrophy; ACIOL anterior chamber intraocular lens; IOL intraocular lens; PCIOL posterior chamber intraocular lens; Phaco/IOL phacoemulsification with intraocular lens placement; [place] photorefractive keratectomy; LASIK laser assisted in situ keratomileusis; HTN hypertension; DM diabetes mellitus; COPD chronic obstructive pulmonary disease

## 2020-04-13 ENCOUNTER — Telehealth: Payer: Self-pay | Admitting: *Deleted

## 2020-04-13 ENCOUNTER — Other Ambulatory Visit: Payer: Self-pay

## 2020-04-13 ENCOUNTER — Ambulatory Visit (AMBULATORY_SURGERY_CENTER): Payer: Self-pay | Admitting: *Deleted

## 2020-04-13 VITALS — Ht 62.0 in | Wt 136.0 lb

## 2020-04-13 DIAGNOSIS — Z4789 Encounter for other orthopedic aftercare: Secondary | ICD-10-CM | POA: Diagnosis not present

## 2020-04-13 DIAGNOSIS — Z86010 Personal history of colonic polyps: Secondary | ICD-10-CM

## 2020-04-13 DIAGNOSIS — S52501D Unspecified fracture of the lower end of right radius, subsequent encounter for closed fracture with routine healing: Secondary | ICD-10-CM | POA: Diagnosis not present

## 2020-04-13 MED ORDER — SUPREP BOWEL PREP KIT 17.5-3.13-1.6 GM/177ML PO SOLN: 1.0000 | Freq: Once | ORAL | 0 refills | Status: AC

## 2020-04-13 NOTE — Progress Notes (Signed)
Comp covid vacc x 2   No egg or soy allergy known to patient  No issues with past sedation with any surgeries or procedures no intubation problems in the past  No diet pills per patient No home 02 use per patient  No blood thinners per patient  Pt denies issues with constipation  No A fib or A flutter  EMMI video to pt or MyChart  COVID 19 guidelines implemented in [place] today   6-28 right wrist surgery after a fall playing pickle ball- TE to JMP- ? Post pone Colon 7-28 - pt had a block and some light sedation   Due to the COVID-19 pandemic we are asking patients to follow these guidelines. Please only bring one care partner. Please be aware that your care partner may wait in the car in the parking lot or if they feel like they will be too hot to wait in the car, they may wait in the lobby on the 4th floor. All care partners are required to wear a mask the entire time (we do not have any that we can provide them), they need to practice social distancing, and we will do a Covid check for all patient's and care partners when you arrive. Also we will check their temperature and your temperature. If the care partner waits in their car they need to stay in the parking lot the entire time and we will call them on their cell phone when the patient is ready for discharge so they can bring the car to the front of the building. Also all patient's will need to wear a mask into building.

## 2020-04-13 NOTE — Telephone Encounter (Signed)
Marie,  This pt is cleared for anesthetic care at [facility].  Thanks,  Sumedha Munnerlyn 

## 2020-04-13 NOTE — Telephone Encounter (Signed)
Thanks,she doesn't have to get surgeons okay s/p wrist surgery-( just making sure)  Tanya Leon

## 2020-04-13 NOTE — Telephone Encounter (Signed)
Called pt 3 times- LMTRC cell x 2 and home number listed x 1 - marie

## 2020-04-13 NOTE — Telephone Encounter (Signed)
Dr Hilarie Fredrickson,  I saw Tanya Leon in [place] today- she fell in June 2021 and broke her right wrsit playing pickle ball and had surgery 03-27-2020- she stated she had a block with some light sedation- she has a colon scheduled for 04-26-20 for a hx of colon polyps. She also told me she has been diagnosed with severe sleep apnea and is wearing a bi pap every night- no 02 with her bi-pap.  Can she proceed with her colon after the surgery just being 6-28 and with her newly dx'd severe OSA or does she need to post pone and have an OV  Please advise, thanks for your time, [name]

## 2020-04-13 NOTE — Telephone Encounter (Signed)
I do not think so, but if she wishes to do so, that is okay with me This is not needed from my perspective Thanks

## 2020-04-13 NOTE — Telephone Encounter (Signed)
Ok for [place] procedure I will cc Nulty to ensure he is in agreement Thanks

## 2020-04-13 NOTE — Telephone Encounter (Signed)
LMTRC to [facility] in pv

## 2020-04-14 NOTE — Telephone Encounter (Signed)
Patient is returning the call  

## 2020-04-14 NOTE — Telephone Encounter (Signed)
Spoke with the patient notified her that she is cleared to have the colonoscopy as scheduled per MD and CRNA recommendations. No questions from the patient at this time.

## 2020-04-19 DIAGNOSIS — Z1231 Encounter for screening mammogram for malignant neoplasm of breast: Secondary | ICD-10-CM | POA: Diagnosis not present

## 2020-04-19 DIAGNOSIS — M85852 Other specified disorders of bone density and structure, left thigh: Secondary | ICD-10-CM | POA: Diagnosis not present

## 2020-04-25 ENCOUNTER — Encounter: Payer: Self-pay | Admitting: Certified Registered Nurse Anesthetist

## 2020-04-26 ENCOUNTER — Ambulatory Visit (AMBULATORY_SURGERY_CENTER): Payer: Medicare Other | Admitting: Internal Medicine

## 2020-04-26 ENCOUNTER — Other Ambulatory Visit: Payer: Self-pay

## 2020-04-26 ENCOUNTER — Encounter: Payer: Self-pay | Admitting: Internal Medicine

## 2020-04-26 VITALS — BP 140/66 | HR 54 | Temp 97.8°F | Resp 11 | Ht 62.0 in | Wt 136.0 lb

## 2020-04-26 DIAGNOSIS — G4733 Obstructive sleep apnea (adult) (pediatric): Secondary | ICD-10-CM | POA: Diagnosis not present

## 2020-04-26 DIAGNOSIS — Z86010 Personal history of colonic polyps: Secondary | ICD-10-CM | POA: Diagnosis not present

## 2020-04-26 MED ORDER — SODIUM CHLORIDE 0.9 % IV SOLN: 500.0000 mL | Freq: Once | INTRAVENOUS | Status: DC

## 2020-04-26 NOTE — Progress Notes (Signed)
Report given to PACU, vss 

## 2020-04-26 NOTE — Progress Notes (Signed)
Vitals-CW ?

## 2020-04-26 NOTE — Patient Instructions (Signed)
Handout on diverticulosis and hemorrhoids given. ° ° ° °YOU HAD AN ENDOSCOPIC PROCEDURE TODAY AT THE [facility]:   Refer to the procedure report that was given to you for any specific questions about what was found during the examination.  If the procedure report does not answer your questions, please call your gastroenterologist to clarify.  If you requested that your care partner not be given the details of your procedure findings, then the procedure report has been included in a sealed envelope for you to review at your convenience later. ° °YOU SHOULD EXPECT: Some feelings of bloating in the abdomen. Passage of more gas than usual.  Walking can help get rid of the air that was put into your GI tract during the procedure and reduce the bloating. If you had a lower endoscopy (such as a colonoscopy or flexible sigmoidoscopy) you may notice spotting of blood in your stool or on the toilet paper. If you underwent a bowel prep for your procedure, you may not have a normal bowel movement for a few days. ° °Please Note:  You might notice some irritation and congestion in your nose or some drainage.  This is from the oxygen used during your procedure.  There is no need for concern and it should clear up in a day or so. ° °SYMPTOMS TO REPORT IMMEDIATELY: ° °Following lower endoscopy (colonoscopy or flexible sigmoidoscopy): ° Excessive amounts of blood in the stool ° Significant tenderness or worsening of abdominal pains ° Swelling of the abdomen that is new, acute ° Fever of 100°F or higher ° °For urgent or emergent issues, a gastroenterologist can be reached at any hour by calling [phone]. °Do not use MyChart messaging for urgent concerns.  ° ° °DIET:  We do recommend a small meal at first, but then you may proceed to your regular diet.  Drink plenty of fluids but you should avoid alcoholic beverages for 24 hours. ° °ACTIVITY:  You should plan to take it easy for the rest of today and you should NOT  DRIVE or use heavy machinery until tomorrow (because of the sedation medicines used during the test).   ° °FOLLOW UP: °Our staff will call the number listed on your records 48-72 hours following your procedure to check on you and address any questions or concerns that you may have regarding the information given to you following your procedure. If we do not reach you, we will leave a message.  We will attempt to reach you two times.  During this call, we will ask if you have developed any symptoms of COVID 19. If you develop any symptoms (ie: fever, flu-like symptoms, shortness of breath, cough etc.) before then, please call [phone].  If you test positive for Covid 19 in the 2 weeks post procedure, please call and report this information to us.   ° °If any biopsies were taken you will be contacted by phone or by letter within the next 1-3 weeks.  Please call us at [phone] if you have not heard about the biopsies in 3 weeks.  ° ° °SIGNATURES/CONFIDENTIALITY: °You and/or your care partner have signed paperwork which will be entered into your electronic medical record.  These signatures attest to the fact that that the information above on your After Visit Summary has been reviewed and is understood.  Full responsibility of the confidentiality of this discharge information lies with you and/or your care-partner.  °

## 2020-04-26 NOTE — Op Note (Signed)
[place] Patient Name: Tanya Leon Procedure Date: 04/26/2020 9:03 AM MRN: [id number] Endoscopist: Jerene Bears , MD Age: 79 Referring MD:  Date of Birth: 08-10-1941 Gender: Female Account #: [id number] Procedure:                Colonoscopy Indications:              High risk colon cancer surveillance: Personal                            history of large flat and sessile adenoma (> 10 mm)                            status post piecemeal resection over several                            colonoscopies, Last colonoscopy: February 2020 with                            chromoendoscopy revealing residual adenoma; status                            post right hemicolectomy for residual cecal/IC                            valve adenoma in July 2020 Medicines:                Monitored Anesthesia Care Procedure:                Pre-Anesthesia Assessment:                           - Prior to the procedure, a History and Physical                            was performed, and patient medications and                            allergies were reviewed. The patient's tolerance of                            previous anesthesia was also reviewed. The risks                            and benefits of the procedure and the sedation                            options and risks were discussed with the patient.                            All questions were answered, and informed consent                            was obtained. Prior Anticoagulants: The patient has  taken no previous anticoagulant or antiplatelet                            agents. ASA Grade Assessment: III - A patient with                            severe systemic disease. After reviewing the risks                            and benefits, the patient was deemed in                            satisfactory condition to undergo the procedure.                           After obtaining informed consent, the  colonoscope                            was passed under direct vision. Throughout the                            procedure, the patient's blood pressure, pulse, and                            oxygen saturations were monitored continuously. The                            Colonoscope was introduced through the anus and                            advanced to the terminal ileum. The colonoscopy was                            performed without difficulty. The patient tolerated                            the procedure well. The quality of the bowel                            preparation was good. The terminal ileum, ileocecal                            valve, appendiceal orifice, and rectum were                            photographed. Scope In: 9:18:43 AM Scope Out: 9:34:05 AM Scope Withdrawal Time: 0 hours 11 minutes 29 seconds  Total Procedure Duration: 0 hours 15 minutes 22 seconds  Findings:                 The digital rectal exam was normal.                           There was evidence of a prior end-to-side  ileo-colonic anastomosis in the ascending colon.                            This was patent and was characterized by healthy                            appearing mucosa. The anastomosis was traversed.                           The neo-terminal ileum appeared normal.                           Multiple small-mouthed diverticula were found in                            the sigmoid colon, descending colon, transverse                            colon and ascending colon.                           There was evidence of a prior end-to-side                            colo-colonic anastomosis in the sigmoid colon. This                            was patent and was characterized by healthy                            appearing mucosa.                           Internal hemorrhoids were found during                            retroflexion. The hemorrhoids were small.                            The exam was otherwise without abnormality. Complications:            No immediate complications. Estimated Blood Loss:     Estimated blood loss: none. Impression:               - Patent end-to-side ileo-colonic anastomosis,                            characterized by healthy appearing mucosa.                           - The examined portion of the ileum was normal.                           - Diverticulosis in the sigmoid colon, in the  descending colon, in the transverse colon and in                            the ascending colon.                           - Patent end-to-side colo-colonic sigmoid                            anastomosis, characterized by healthy appearing                            mucosa.                           - Internal hemorrhoids.                           - The examination was otherwise normal.                           - No specimens collected. Recommendation:           - Patient has a contact number available for                            emergencies. The signs and symptoms of potential                            delayed complications were discussed with the                            patient. Return to normal activities tomorrow.                            Written discharge instructions were provided to the                            patient.                           - Resume previous diet.                           - Continue present medications.                           - No recommendation at this time regarding repeat                            colonoscopy due to lack of polyps today and current                            age. Colonoscopy could be considered in 3 years                            based on overall health at that time.  Jerene Bears, MD 04/26/2020 9:44:26 AM This report has been signed electronically.

## 2020-04-27 DIAGNOSIS — M96631 Fracture of radius or ulna following insertion of orthopedic implant, joint prosthesis, or bone plate, right arm: Secondary | ICD-10-CM | POA: Diagnosis not present

## 2020-04-27 DIAGNOSIS — S52501D Unspecified fracture of the lower end of right radius, subsequent encounter for closed fracture with routine healing: Secondary | ICD-10-CM | POA: Diagnosis not present

## 2020-04-27 DIAGNOSIS — Z4789 Encounter for other orthopedic aftercare: Secondary | ICD-10-CM | POA: Diagnosis not present

## 2020-04-27 DIAGNOSIS — M6389 Disorders of muscle in diseases classified elsewhere, multiple sites: Secondary | ICD-10-CM | POA: Diagnosis not present

## 2020-04-28 ENCOUNTER — Telehealth: Payer: Self-pay | Admitting: *Deleted

## 2020-04-28 NOTE — Telephone Encounter (Signed)
°  Follow up Call-  Call back number 04/26/2020  Post procedure Call Back phone  # [phone]  Permission to leave phone message Yes  Some recent data might be hidden    Spoke with Tanya Leon- pt sleeping Patient questions:  Do you have a fever, pain , or abdominal swelling? No. Pain Score  0 *  Have you tolerated food without any problems? Yes.    Have you been able to return to your normal activities? Yes.    Do you have any questions about your discharge instructions: Diet   No. Medications  No. Follow up visit  No.  Do you have questions or concerns about your Care? No.  Actions: * If pain score is 4 or above: No action needed, pain <4.  1. Have you developed a fever since your procedure? no  2.   Have you had an respiratory symptoms (SOB or cough) since your procedure? no  3.   Have you tested positive for COVID 19 since your procedure no  4.   Have you had any family members/close contacts diagnosed with the COVID 19 since your procedure?  no   If yes to any of these questions please route to Joylene John, RN and Erenest Rasher, RN

## 2020-05-03 DIAGNOSIS — M96631 Fracture of radius or ulna following insertion of orthopedic implant, joint prosthesis, or bone plate, right arm: Secondary | ICD-10-CM | POA: Diagnosis not present

## 2020-05-03 DIAGNOSIS — Z4789 Encounter for other orthopedic aftercare: Secondary | ICD-10-CM | POA: Diagnosis not present

## 2020-05-03 DIAGNOSIS — M6389 Disorders of muscle in diseases classified elsewhere, multiple sites: Secondary | ICD-10-CM | POA: Diagnosis not present

## 2020-05-04 DIAGNOSIS — Z4789 Encounter for other orthopedic aftercare: Secondary | ICD-10-CM | POA: Diagnosis not present

## 2020-05-04 DIAGNOSIS — M6389 Disorders of muscle in diseases classified elsewhere, multiple sites: Secondary | ICD-10-CM | POA: Diagnosis not present

## 2020-05-04 DIAGNOSIS — M96631 Fracture of radius or ulna following insertion of orthopedic implant, joint prosthesis, or bone plate, right arm: Secondary | ICD-10-CM | POA: Diagnosis not present

## 2020-05-07 DIAGNOSIS — M96631 Fracture of radius or ulna following insertion of orthopedic implant, joint prosthesis, or bone plate, right arm: Secondary | ICD-10-CM | POA: Diagnosis not present

## 2020-05-07 DIAGNOSIS — M6389 Disorders of muscle in diseases classified elsewhere, multiple sites: Secondary | ICD-10-CM | POA: Diagnosis not present

## 2020-05-07 DIAGNOSIS — Z4789 Encounter for other orthopedic aftercare: Secondary | ICD-10-CM | POA: Diagnosis not present

## 2020-05-08 DIAGNOSIS — M6389 Disorders of muscle in diseases classified elsewhere, multiple sites: Secondary | ICD-10-CM | POA: Diagnosis not present

## 2020-05-08 DIAGNOSIS — M96631 Fracture of radius or ulna following insertion of orthopedic implant, joint prosthesis, or bone plate, right arm: Secondary | ICD-10-CM | POA: Diagnosis not present

## 2020-05-08 DIAGNOSIS — Z4789 Encounter for other orthopedic aftercare: Secondary | ICD-10-CM | POA: Diagnosis not present

## 2020-05-10 DIAGNOSIS — I517 Cardiomegaly: Secondary | ICD-10-CM | POA: Diagnosis not present

## 2020-05-10 DIAGNOSIS — G4733 Obstructive sleep apnea (adult) (pediatric): Secondary | ICD-10-CM | POA: Diagnosis not present

## 2020-05-10 DIAGNOSIS — R0602 Shortness of breath: Secondary | ICD-10-CM | POA: Diagnosis not present

## 2020-05-10 DIAGNOSIS — I272 Pulmonary hypertension, unspecified: Secondary | ICD-10-CM | POA: Diagnosis not present

## 2020-05-15 DIAGNOSIS — Z4789 Encounter for other orthopedic aftercare: Secondary | ICD-10-CM | POA: Diagnosis not present

## 2020-05-15 DIAGNOSIS — M96631 Fracture of radius or ulna following insertion of orthopedic implant, joint prosthesis, or bone plate, right arm: Secondary | ICD-10-CM | POA: Diagnosis not present

## 2020-05-15 DIAGNOSIS — M6389 Disorders of muscle in diseases classified elsewhere, multiple sites: Secondary | ICD-10-CM | POA: Diagnosis not present

## 2020-05-17 DIAGNOSIS — M6389 Disorders of muscle in diseases classified elsewhere, multiple sites: Secondary | ICD-10-CM | POA: Diagnosis not present

## 2020-05-17 DIAGNOSIS — Z4789 Encounter for other orthopedic aftercare: Secondary | ICD-10-CM | POA: Diagnosis not present

## 2020-05-17 DIAGNOSIS — M96631 Fracture of radius or ulna following insertion of orthopedic implant, joint prosthesis, or bone plate, right arm: Secondary | ICD-10-CM | POA: Diagnosis not present

## 2020-05-18 ENCOUNTER — Other Ambulatory Visit: Payer: Self-pay | Admitting: Family Medicine

## 2020-05-19 DIAGNOSIS — S52501D Unspecified fracture of the lower end of right radius, subsequent encounter for closed fracture with routine healing: Secondary | ICD-10-CM | POA: Diagnosis not present

## 2020-05-19 DIAGNOSIS — Z4789 Encounter for other orthopedic aftercare: Secondary | ICD-10-CM | POA: Diagnosis not present

## 2020-05-22 DIAGNOSIS — M6389 Disorders of muscle in diseases classified elsewhere, multiple sites: Secondary | ICD-10-CM | POA: Diagnosis not present

## 2020-05-22 DIAGNOSIS — Z4789 Encounter for other orthopedic aftercare: Secondary | ICD-10-CM | POA: Diagnosis not present

## 2020-05-22 DIAGNOSIS — M96631 Fracture of radius or ulna following insertion of orthopedic implant, joint prosthesis, or bone plate, right arm: Secondary | ICD-10-CM | POA: Diagnosis not present

## 2020-05-24 DIAGNOSIS — M96631 Fracture of radius or ulna following insertion of orthopedic implant, joint prosthesis, or bone plate, right arm: Secondary | ICD-10-CM | POA: Diagnosis not present

## 2020-05-24 DIAGNOSIS — Z4789 Encounter for other orthopedic aftercare: Secondary | ICD-10-CM | POA: Diagnosis not present

## 2020-05-24 DIAGNOSIS — N884 Hypertrophic elongation of cervix uteri: Secondary | ICD-10-CM | POA: Diagnosis not present

## 2020-05-24 DIAGNOSIS — Z8742 Personal history of other diseases of the female genital tract: Secondary | ICD-10-CM | POA: Diagnosis not present

## 2020-05-24 DIAGNOSIS — M6389 Disorders of muscle in diseases classified elsewhere, multiple sites: Secondary | ICD-10-CM | POA: Diagnosis not present

## 2020-05-29 DIAGNOSIS — M6389 Disorders of muscle in diseases classified elsewhere, multiple sites: Secondary | ICD-10-CM | POA: Diagnosis not present

## 2020-05-29 DIAGNOSIS — Z4789 Encounter for other orthopedic aftercare: Secondary | ICD-10-CM | POA: Diagnosis not present

## 2020-05-29 DIAGNOSIS — M96631 Fracture of radius or ulna following insertion of orthopedic implant, joint prosthesis, or bone plate, right arm: Secondary | ICD-10-CM | POA: Diagnosis not present

## 2020-05-30 DIAGNOSIS — D3610 Benign neoplasm of peripheral nerves and autonomic nervous system, unspecified: Secondary | ICD-10-CM | POA: Diagnosis not present

## 2020-05-30 DIAGNOSIS — Z85828 Personal history of other malignant neoplasm of skin: Secondary | ICD-10-CM | POA: Diagnosis not present

## 2020-05-30 DIAGNOSIS — L821 Other seborrheic keratosis: Secondary | ICD-10-CM | POA: Diagnosis not present

## 2020-05-30 DIAGNOSIS — L578 Other skin changes due to chronic exposure to nonionizing radiation: Secondary | ICD-10-CM | POA: Diagnosis not present

## 2020-05-30 DIAGNOSIS — D225 Melanocytic nevi of trunk: Secondary | ICD-10-CM | POA: Diagnosis not present

## 2020-05-30 DIAGNOSIS — L723 Sebaceous cyst: Secondary | ICD-10-CM | POA: Diagnosis not present

## 2020-05-31 DIAGNOSIS — M6389 Disorders of muscle in diseases classified elsewhere, multiple sites: Secondary | ICD-10-CM | POA: Diagnosis not present

## 2020-05-31 DIAGNOSIS — Z4789 Encounter for other orthopedic aftercare: Secondary | ICD-10-CM | POA: Diagnosis not present

## 2020-05-31 DIAGNOSIS — M96631 Fracture of radius or ulna following insertion of orthopedic implant, joint prosthesis, or bone plate, right arm: Secondary | ICD-10-CM | POA: Diagnosis not present

## 2020-06-05 DIAGNOSIS — M96631 Fracture of radius or ulna following insertion of orthopedic implant, joint prosthesis, or bone plate, right arm: Secondary | ICD-10-CM | POA: Diagnosis not present

## 2020-06-05 DIAGNOSIS — M6389 Disorders of muscle in diseases classified elsewhere, multiple sites: Secondary | ICD-10-CM | POA: Diagnosis not present

## 2020-06-05 DIAGNOSIS — Z4789 Encounter for other orthopedic aftercare: Secondary | ICD-10-CM | POA: Diagnosis not present

## 2020-06-08 DIAGNOSIS — M6389 Disorders of muscle in diseases classified elsewhere, multiple sites: Secondary | ICD-10-CM | POA: Diagnosis not present

## 2020-06-08 DIAGNOSIS — M96631 Fracture of radius or ulna following insertion of orthopedic implant, joint prosthesis, or bone plate, right arm: Secondary | ICD-10-CM | POA: Diagnosis not present

## 2020-06-08 DIAGNOSIS — Z4789 Encounter for other orthopedic aftercare: Secondary | ICD-10-CM | POA: Diagnosis not present

## 2020-06-12 DIAGNOSIS — M6389 Disorders of muscle in diseases classified elsewhere, multiple sites: Secondary | ICD-10-CM | POA: Diagnosis not present

## 2020-06-12 DIAGNOSIS — Z4789 Encounter for other orthopedic aftercare: Secondary | ICD-10-CM | POA: Diagnosis not present

## 2020-06-12 DIAGNOSIS — M96631 Fracture of radius or ulna following insertion of orthopedic implant, joint prosthesis, or bone plate, right arm: Secondary | ICD-10-CM | POA: Diagnosis not present

## 2020-06-14 DIAGNOSIS — M6389 Disorders of muscle in diseases classified elsewhere, multiple sites: Secondary | ICD-10-CM | POA: Diagnosis not present

## 2020-06-14 DIAGNOSIS — M96631 Fracture of radius or ulna following insertion of orthopedic implant, joint prosthesis, or bone plate, right arm: Secondary | ICD-10-CM | POA: Diagnosis not present

## 2020-06-14 DIAGNOSIS — Z4789 Encounter for other orthopedic aftercare: Secondary | ICD-10-CM | POA: Diagnosis not present

## 2020-06-16 DIAGNOSIS — S52501D Unspecified fracture of the lower end of right radius, subsequent encounter for closed fracture with routine healing: Secondary | ICD-10-CM | POA: Diagnosis not present

## 2020-06-20 DIAGNOSIS — M96631 Fracture of radius or ulna following insertion of orthopedic implant, joint prosthesis, or bone plate, right arm: Secondary | ICD-10-CM | POA: Diagnosis not present

## 2020-06-20 DIAGNOSIS — Z4789 Encounter for other orthopedic aftercare: Secondary | ICD-10-CM | POA: Diagnosis not present

## 2020-06-20 DIAGNOSIS — M6389 Disorders of muscle in diseases classified elsewhere, multiple sites: Secondary | ICD-10-CM | POA: Diagnosis not present

## 2020-06-22 DIAGNOSIS — Z85828 Personal history of other malignant neoplasm of skin: Secondary | ICD-10-CM | POA: Diagnosis not present

## 2020-06-22 DIAGNOSIS — I700 Atherosclerosis of aorta: Secondary | ICD-10-CM | POA: Diagnosis not present

## 2020-06-22 DIAGNOSIS — I272 Pulmonary hypertension, unspecified: Secondary | ICD-10-CM | POA: Diagnosis not present

## 2020-06-22 DIAGNOSIS — J984 Other disorders of lung: Secondary | ICD-10-CM | POA: Diagnosis not present

## 2020-06-22 DIAGNOSIS — J479 Bronchiectasis, uncomplicated: Secondary | ICD-10-CM | POA: Diagnosis not present

## 2020-06-22 DIAGNOSIS — I517 Cardiomegaly: Secondary | ICD-10-CM | POA: Diagnosis not present

## 2020-06-23 DIAGNOSIS — Z4789 Encounter for other orthopedic aftercare: Secondary | ICD-10-CM | POA: Diagnosis not present

## 2020-06-23 DIAGNOSIS — M6389 Disorders of muscle in diseases classified elsewhere, multiple sites: Secondary | ICD-10-CM | POA: Diagnosis not present

## 2020-06-23 DIAGNOSIS — M96631 Fracture of radius or ulna following insertion of orthopedic implant, joint prosthesis, or bone plate, right arm: Secondary | ICD-10-CM | POA: Diagnosis not present

## 2020-06-27 DIAGNOSIS — M6389 Disorders of muscle in diseases classified elsewhere, multiple sites: Secondary | ICD-10-CM | POA: Diagnosis not present

## 2020-06-27 DIAGNOSIS — Z4789 Encounter for other orthopedic aftercare: Secondary | ICD-10-CM | POA: Diagnosis not present

## 2020-06-27 DIAGNOSIS — M96631 Fracture of radius or ulna following insertion of orthopedic implant, joint prosthesis, or bone plate, right arm: Secondary | ICD-10-CM | POA: Diagnosis not present

## 2020-06-28 DIAGNOSIS — G4733 Obstructive sleep apnea (adult) (pediatric): Secondary | ICD-10-CM | POA: Diagnosis not present

## 2020-06-28 DIAGNOSIS — I272 Pulmonary hypertension, unspecified: Secondary | ICD-10-CM | POA: Diagnosis not present

## 2020-06-28 DIAGNOSIS — R0683 Snoring: Secondary | ICD-10-CM | POA: Diagnosis not present

## 2020-06-28 DIAGNOSIS — G4730 Sleep apnea, unspecified: Secondary | ICD-10-CM | POA: Diagnosis not present

## 2020-07-01 DIAGNOSIS — Z23 Encounter for immunization: Secondary | ICD-10-CM | POA: Diagnosis not present

## 2020-07-03 DIAGNOSIS — Z4789 Encounter for other orthopedic aftercare: Secondary | ICD-10-CM | POA: Diagnosis not present

## 2020-07-03 DIAGNOSIS — M96631 Fracture of radius or ulna following insertion of orthopedic implant, joint prosthesis, or bone plate, right arm: Secondary | ICD-10-CM | POA: Diagnosis not present

## 2020-07-03 DIAGNOSIS — M6389 Disorders of muscle in diseases classified elsewhere, multiple sites: Secondary | ICD-10-CM | POA: Diagnosis not present

## 2020-07-06 DIAGNOSIS — I272 Pulmonary hypertension, unspecified: Secondary | ICD-10-CM | POA: Diagnosis not present

## 2020-07-06 DIAGNOSIS — D696 Thrombocytopenia, unspecified: Secondary | ICD-10-CM | POA: Diagnosis not present

## 2020-07-06 DIAGNOSIS — Z0189 Encounter for other specified special examinations: Secondary | ICD-10-CM | POA: Diagnosis not present

## 2020-07-06 DIAGNOSIS — E7849 Other hyperlipidemia: Secondary | ICD-10-CM | POA: Diagnosis not present

## 2020-07-10 DIAGNOSIS — M96631 Fracture of radius or ulna following insertion of orthopedic implant, joint prosthesis, or bone plate, right arm: Secondary | ICD-10-CM | POA: Diagnosis not present

## 2020-07-10 DIAGNOSIS — Z4789 Encounter for other orthopedic aftercare: Secondary | ICD-10-CM | POA: Diagnosis not present

## 2020-07-10 DIAGNOSIS — M6389 Disorders of muscle in diseases classified elsewhere, multiple sites: Secondary | ICD-10-CM | POA: Diagnosis not present

## 2020-07-11 DIAGNOSIS — M545 Low back pain, unspecified: Secondary | ICD-10-CM | POA: Diagnosis not present

## 2020-07-11 DIAGNOSIS — G4733 Obstructive sleep apnea (adult) (pediatric): Secondary | ICD-10-CM | POA: Diagnosis not present

## 2020-07-11 DIAGNOSIS — I272 Pulmonary hypertension, unspecified: Secondary | ICD-10-CM | POA: Diagnosis not present

## 2020-07-11 DIAGNOSIS — Z0000 Encounter for general adult medical examination without abnormal findings: Secondary | ICD-10-CM | POA: Diagnosis not present

## 2020-07-11 DIAGNOSIS — G8929 Other chronic pain: Secondary | ICD-10-CM | POA: Diagnosis not present

## 2020-07-11 DIAGNOSIS — L718 Other rosacea: Secondary | ICD-10-CM | POA: Diagnosis not present

## 2020-07-11 DIAGNOSIS — M8580 Other specified disorders of bone density and structure, unspecified site: Secondary | ICD-10-CM | POA: Diagnosis not present

## 2020-07-11 DIAGNOSIS — D696 Thrombocytopenia, unspecified: Secondary | ICD-10-CM | POA: Diagnosis not present

## 2020-07-11 DIAGNOSIS — F32A Depression, unspecified: Secondary | ICD-10-CM | POA: Diagnosis not present

## 2020-07-13 ENCOUNTER — Encounter (INDEPENDENT_AMBULATORY_CARE_PROVIDER_SITE_OTHER): Payer: Medicare Other | Admitting: Ophthalmology

## 2020-07-14 DIAGNOSIS — S52501D Unspecified fracture of the lower end of right radius, subsequent encounter for closed fracture with routine healing: Secondary | ICD-10-CM | POA: Diagnosis not present

## 2020-07-17 DIAGNOSIS — H353132 Nonexudative age-related macular degeneration, bilateral, intermediate dry stage: Secondary | ICD-10-CM | POA: Diagnosis not present

## 2020-07-17 DIAGNOSIS — H04123 Dry eye syndrome of bilateral lacrimal glands: Secondary | ICD-10-CM | POA: Diagnosis not present

## 2020-07-17 DIAGNOSIS — M6389 Disorders of muscle in diseases classified elsewhere, multiple sites: Secondary | ICD-10-CM | POA: Diagnosis not present

## 2020-07-17 DIAGNOSIS — Z4789 Encounter for other orthopedic aftercare: Secondary | ICD-10-CM | POA: Diagnosis not present

## 2020-07-17 DIAGNOSIS — M96631 Fracture of radius or ulna following insertion of orthopedic implant, joint prosthesis, or bone plate, right arm: Secondary | ICD-10-CM | POA: Diagnosis not present

## 2020-07-17 DIAGNOSIS — H401132 Primary open-angle glaucoma, bilateral, moderate stage: Secondary | ICD-10-CM | POA: Diagnosis not present

## 2020-07-17 DIAGNOSIS — H35072 Retinal telangiectasis, left eye: Secondary | ICD-10-CM | POA: Diagnosis not present

## 2020-07-19 DIAGNOSIS — D485 Neoplasm of uncertain behavior of skin: Secondary | ICD-10-CM | POA: Diagnosis not present

## 2020-07-19 DIAGNOSIS — L720 Epidermal cyst: Secondary | ICD-10-CM | POA: Diagnosis not present

## 2020-07-20 ENCOUNTER — Encounter (INDEPENDENT_AMBULATORY_CARE_PROVIDER_SITE_OTHER): Payer: Medicare Other | Admitting: Ophthalmology

## 2020-07-24 ENCOUNTER — Encounter (INDEPENDENT_AMBULATORY_CARE_PROVIDER_SITE_OTHER): Payer: Self-pay | Admitting: Ophthalmology

## 2020-07-24 ENCOUNTER — Other Ambulatory Visit: Payer: Self-pay

## 2020-07-24 ENCOUNTER — Ambulatory Visit (INDEPENDENT_AMBULATORY_CARE_PROVIDER_SITE_OTHER): Payer: Medicare Other | Admitting: Ophthalmology

## 2020-07-24 DIAGNOSIS — H35072 Retinal telangiectasis, left eye: Secondary | ICD-10-CM

## 2020-07-24 DIAGNOSIS — H35071 Retinal telangiectasis, right eye: Secondary | ICD-10-CM | POA: Diagnosis not present

## 2020-07-24 DIAGNOSIS — G4733 Obstructive sleep apnea (adult) (pediatric): Secondary | ICD-10-CM | POA: Diagnosis not present

## 2020-07-24 NOTE — Assessment & Plan Note (Signed)
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  She reports formal sleep apnea testing disclosed severe AHI, and has since commenced with use of a BiPAP mask  Patient awakens with a improved sense of wellbeing and awakens fewer times per night.  This is a good suggestion that enhanced nightly oxygenation is occurring.  Moreover OCT of the right eye discloses a minor improvement in retinal thickening over the duration of time from February to now October 2021

## 2020-07-24 NOTE — Progress Notes (Signed)
07/24/2020     CHIEF COMPLAINT Patient presents for Retina Follow Up   HISTORY OF PRESENT ILLNESS: Tanya Leon is a 79 y.o. female who presents to the clinic today for:   HPI    Retina Follow Up    Patient presents with  Other.  In both eyes.  This started 3 months ago.  Severity is mild.  Duration of 3 months.  Since onset it is stable.          Comments    3 Month F/U OU  Pt denies noticeable changes to [place] OU since last visit. Pt denies ocular pain, flashes of light, or floaters OU.         Last edited by [name], [place] on 07/24/2020  1:45 PM. (History)      Referring physician: [name], MD [place],  [place] [address]  HISTORICAL INFORMATION:   Selected notes from the MEDICAL RECORD NUMBER    Lab Results  Component Value Date   HGBA1C 5.0 04/27/2019     CURRENT MEDICATIONS: Current Outpatient Medications (Ophthalmic Drugs)  Medication Sig  . azithromycin (AZASITE) 1 % ophthalmic solution Place 1 drop into both eyes See admin instructions. Apply drop onto each eye lid for 10 days out of each month.  . Brinzolamide-Brimonidine (SIMBRINZA) 1-0.2 % SUSP Place 1 drop into the left eye 2 (two) times a day.  . carboxymethylcellulose (REFRESH PLUS) 0.5 % SOLN Place 1 drop into both eyes 3 (three) times daily as needed (Dry eyes).   . cycloSPORINE (RESTASIS) 0.05 % ophthalmic emulsion Place 1 drop into both eyes 2 (two) times daily.   Marland Kitchen olopatadine (PATANOL) 0.1 % ophthalmic solution INSTILL 1 DROP INTO BOTH EYES TWICE A DAY   No current facility-administered medications for this visit. (Ophthalmic Drugs)   Current Outpatient Medications (Other)  Medication Sig  . amoxicillin (AMOXIL) 500 MG capsule TAKE 4 CAPSULES 1 HOUR PRIOR TO APPOINTMENT (Patient not taking: Reported on 04/13/2020)  . aspirin-acetaminophen-caffeine (EXCEDRIN EXTRA STRENGTH) 250-250-65 MG per tablet Take 2 tablets by mouth every 6 (six) hours as needed for headache.     . Azelaic Acid (FINACEA) 15 % cream Apply 1 application topically 2 (two) times daily. After skin is thoroughly washed and patted dry, gently but thoroughly massage a thin film of azelaic acid cream into the affected area twice daily, in the morning and evening.  . Biotin 5000 MCG TABS Take 5,000 mcg by mouth every evening.  . Calcium-Magnesium 500-250 MG TABS Take 1 tablet by mouth daily.  . Cholecalciferol (VITAMIN D3) 50 MCG (2000 UT) TABS Take 2,000 Units by mouth every evening.  . Ciclopirox 1 % shampoo Apply 1 each topically 2 (two) times a week.   . DULoxetine (CYMBALTA) 60 MG capsule TAKE 1 CAPSULE BY MOUTH EVERY DAY (Patient taking differently: Take 60 mg by mouth daily. )  . estradiol (VIVELLE-DOT) 0.1 MG/24HR patch Place 1 patch onto the skin 2 (two) times a week.   Marland Kitchen EVENING PRIMROSE OIL PO Take 1,300 mg by mouth 2 (two) times daily.  . fluocinolone (SYNALAR) 0.01 % external solution Apply topically.  . fluocinonide (LIDEX) 0.05 % external solution Apply 1 application topically 2 (two) times a week. Applied to scalp after shampooing   . fluticasone (FLONASE) 50 MCG/ACT nasal spray SPRAY 2 SPRAYS INTO EACH NOSTRIL EVERY DAY  . Lutein 20 MG TABS Take 20 mg by mouth 2 (two) times daily.   Marland Kitchen morphine (  MSIR) 15 MG tablet Take 0.5 tablets (7.5 mg total) by mouth every 4 (four) hours as needed for severe pain.  . Multiple Vitamin (MULTIVITAMIN WITH MINERALS) TABS tablet Take 1 tablet by mouth daily.  . Multiple Vitamins-Minerals (PRESERVISION AREDS 2 PO) Take 1 tablet by mouth 2 (two) times daily.  . NONFORMULARY OR COMPOUNDED ITEM [facility]:  Antifungal Nail Lacquer - Terbinafine 3%, Fluconazole 2%, Tea Tree Oil 5%, Urea 10%, Ibuprofen 2%, in DMSO suspension #36m, apply to affected nails daily at bedtime or twice daily. (Patient taking differently: Apply 1 application topically at bedtime. [facility]:  Antifungal Nail Lacquer - Terbinafine 3%, Fluconazole 2%, Tea Tree Oil  5%, Urea 10%, Ibuprofen 2%, in DMSO suspension #331m apply to affected nails daily at bedtime or twice daily.)  . omega-3 acid ethyl esters (LOVAZA) 1 G capsule Take 1,000 mg by mouth 2 (two) times daily.   . polyethylene glycol powder (GLYCOLAX/MIRALAX) powder Take 17 g by mouth daily.   . pregabalin (LYRICA) 25 MG capsule Take 1 capsule (25 mg total) by mouth 2 (two) times daily. TAKE 1 CAPSULE IN THE MORNING AND TAKE 1 CAPSULE IN THE EVENING (Patient taking differently: Take 25 mg by mouth 2 (two) times daily. TAKE 1CAPSULE IN THE MORNING AND TAKE 2  CAPSULE IN THE EVENING)  . tiZANidine (ZANAFLEX) 2 MG tablet TAKE 1 TABLET (2 MG TOTAL) BY MOUTH AT BEDTIME AS NEEDED.   Current Facility-Administered Medications (Other)  Medication Route  . 0.9 %  sodium chloride infusion Intravenous      REVIEW OF SYSTEMS:    ALLERGIES Allergies  Allergen Reactions  . Crab [Shellfish Allergy] Swelling    Soft shelled crab  . Doxycycline Rash    PAST MEDICAL HISTORY Past Medical History:  Diagnosis Date  . Allergy 1973   Seasonal  . Arthritis   . Blood transfusion without reported diagnosis 08/15/2008   Following hip replacement  . Cancer (HCC)    hx of skin cancer   . Cataract 1998   Cataract surgery both eyes  . Colon polyps    adenomatous  . Complication of anesthesia    " pt stated they gave me too much - kept having to be reminded to breathe   . Constipation    past hx- no longer an issue   . Depression    hx of   . Diverticulosis   . DIVERTICULOSIS, COLON 01/13/2009  . Glaucoma 05-12-14  . Headache    history of migraines  . Heart murmur    benign, dagnosed around age 24,prophylactic antibiotic  . Low back pain   . Lumbar spondylolysis   . Osteopenia   . Pneumonia    hx of as a baby   . Pulmonary hypertension (HCSnelling   monitored by cards Dr BeGwenlyn Foundnnually via echo   . PVC's (premature ventricular contractions)    i have them periodically , Dr BeGwenlyn Foundad me cut back on  caffeine and that helped   . Rectocele   . Sleep apnea    wears bi-pap, no oxygen   . Tubular adenoma of colon   . Wears hearing aid in both ears    Past Surgical History:  Procedure Laterality Date  . ABDOMINAL HERNIA REPAIR Right    incisional  . ABDOMINAL HYSTERECTOMY     with ovaries  . BACK SURGERY  2009   L4-L5 with rods  . BIOPSY  11/13/2018   Procedure: BIOPSY;  Surgeon: PyJerene Bears  MD;  Location: [facility];  Service: Gastroenterology;;  . BLADDER SUSPENSION N/A 04/09/2017   Procedure: TRANSVAGINAL TAPE (TVT) PROCEDURE;  Surgeon: Bobbye Charleston, MD;  Location: [facility];  Service: Gynecology;  Laterality: N/A;  . CATARACT EXTRACTION, BILATERAL    . cataract left lens replacement     2016  . COLON SURGERY  2007   Rpr abscessed ruptured diverticuli  . COLONOSCOPY    . COLONOSCOPY N/A 08/29/2017   Procedure: COLONOSCOPY;  Surgeon: Jerene Bears, MD;  Location: [facility];  Service: Gastroenterology;  Laterality: N/A;  . COLONOSCOPY WITH PROPOFOL N/A 11/13/2018   Procedure: COLONOSCOPY WITH PROPOFOL with Methylane Blue Injection;  Surgeon: Jerene Bears, MD;  Location: [facility];  Service: Gastroenterology;  Laterality: N/A;  . CYSTOCELE REPAIR N/A 04/09/2017   Procedure: ANTERIOR REPAIR (CYSTOCELE);  Surgeon: Bobbye Charleston, MD;  Location: [facility];  Service: Gynecology;  Laterality: N/A;  . CYSTOSCOPY N/A 04/09/2017   Procedure: CYSTOSCOPY;  Surgeon: Bobbye Charleston, MD;  Location: [facility];  Service: Gynecology;  Laterality: N/A;  . EYE MUSCLE SURGERY    . FRACTURE SURGERY  11-13-17   L wrist-open reduction internal fixation  . GLAUCOMA SURGERY Left 05/12/14  . HERNIA REPAIR  05-28-11   Hernia result of 2007 colon surg  . incisional ab  05/28/11  . JOINT REPLACEMENT     right hip replacement   . LAPAROSCOPIC RIGHT HEMI COLECTOMY Right 04/30/2019   Procedure: LAPAROSCOPIC  RIGHT HEMI COLECTOMY;  Surgeon: Ileana Roup, MD;  Location: [facility];  Service:  General;  Laterality: Right;  . LUMBAR FUSION     L4-L5  . PARTIAL KNEE ARTHROPLASTY Right   . PARTIAL KNEE ARTHROPLASTY Left   . perforated deverticular abscess/laporotomy and colostomy     depression after this  . POLYPECTOMY N/A 08/29/2017   Procedure: POLYPECTOMY;  Surgeon: Jerene Bears, MD;  Location: [facility];  Service: Gastroenterology;  Laterality: N/A;  . POLYPECTOMY  11/13/2018   Procedure: POLYPECTOMY;  Surgeon: Jerene Bears, MD;  Location: [facility];  Service: Gastroenterology;;  . POLYPECTOMY    . RECTOCELE REPAIR  01/13/2020  . SPINE SURGERY  02-16-08   Fusion L4L5  . stribismus eye surgery Right   . take down of colostomy    . tonsillectomly    . TONSILLECTOMY    . TOTAL HIP ARTHROPLASTY Right   . TOTAL KNEE REVISION Left 11/24/2015   Procedure: Conversion from left lateral unicompartmental knee arthroplasty to left total knee arthroplasty;  Surgeon: Mcarthur Rossetti, MD;  Location: [facility];  Service: Orthopedics;  Laterality: Left;  . TUBAL LIGATION  1976  . WRIST SURGERY Right 03/27/2020    FAMILY HISTORY Family History  Problem Relation Age of Onset  . Alzheimer's disease Father   . Heart disease Father        mitral valve replaced. unknown reason  . Breast cancer Mother        72  . Colon polyps Mother   . Diabetes Mother   . Heart disease Mother 26       CHF, MI 65  . Heart attack Mother        x 2  . Arthritis Mother   . Cancer Mother   . Hearing loss Mother   . Hypertension Mother   . Prostate cancer Brother   . Cancer Brother   . Hearing loss Brother   . Hypertension Brother   . Asthma Paternal Uncle   . Hypertension Sister   .  Colon cancer Neg Hx   . Esophageal cancer Neg Hx   . Rectal cancer Neg Hx   . Stomach cancer Neg Hx     SOCIAL HISTORY Social History   Tobacco Use  . Smoking status: Never Smoker  . Smokeless tobacco: Never Used  Vaping Use  . Vaping Use: Never used  Substance Use Topics  . Alcohol use: Yes     Alcohol/week: 7.0 standard drinks    Types: 7 Glasses of wine per week    Comment: 1 glass wine with dinner most evenings  . Drug use: No         OPHTHALMIC EXAM:  Base Eye Exam    Visual Acuity (ETDRS)      Right Left   Dist cc 20/20 -2 20/40 +2   Dist ph cc  20/25 -2   Correction: Glasses       Tonometry (Tonopen, 1:45 PM)      Right Left   Pressure 15 14       Pupils      Pupils Dark Light Shape React APD   Right PERRL 4 3 Round Brisk None   Left PERRL 4 3 Round Brisk None       Visual Fields (Counting fingers)      Left Right    Full Full       Extraocular Movement      Right Left    Full Full       Neuro/Psych    Oriented x3: Yes   Mood/Affect: Normal       Dilation    Both eyes: 1.0% Mydriacyl, 2.5% Phenylephrine @ 1:49 PM        Slit Lamp and Fundus Exam    External Exam      Right Left   External Normal Normal       Slit Lamp Exam      Right Left   Lids/Lashes Normal Normal   Conjunctiva/Sclera White and quiet White and quiet   Cornea Clear Clear   Anterior Chamber Deep and quiet Deep and quiet   Iris Round and reactive Round and reactive   Lens Posterior chamber intraocular lens Posterior chamber intraocular lens   Anterior Vitreous Normal Normal       Fundus Exam      Right Left   Posterior Vitreous Posterior vitreous detachment Posterior vitreous detachment   Disc Peripapillary atrophy Peripapillary atrophy   C/D Ratio 0.05 0.05   Macula Hard drusen, no exudates, no hemorrhage, Geographic atrophy Hard drusen, no exudates, no hemorrhage, Geographic atrophy   Vessels Normal Normal   Periphery Normal Normal          IMAGING AND PROCEDURES  Imaging and Procedures for 07/24/20  OCT, Retina - OU - Both Eyes       Right Eye Quality was good. Scan locations included subfoveal. Central Foveal Thickness: 314.   Left Eye Quality was good. Scan locations included subfoveal. Central Foveal Thickness: 289.                  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  She reports formal sleep apnea testing disclosed severe AHI, and has since commenced with use of a BiPAP mask  Patient awakens with a improved sense of wellbeing and awakens fewer times per night.  This is a good suggestion that enhanced nightly oxygenation is occurring.  Moreover OCT of the right eye discloses a minor improvement in retinal thickening over the duration of time from February to now October 2021      ICD-10-CM   1. 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 Retinal telangiectasia of left eye  H35.072   3. Obstructive sleep apnea  G47.33     1.  2.  3.  Ophthalmic Meds Ordered this visit:  No orders of the defined types were placed in this encounter.      Return in about 6 months (around 01/22/2021) for DILATE OU, OCT.  There are no Patient Instructions on file for this visit.   Explained the diagnoses, plan, and follow up with the patient and they expressed understanding.  Patient expressed understanding of the importance of proper follow up care.   Clent Demark Steffon Gladu M.D. Diseases & Surgery of the Retina and Vitreous [facility] 07/24/20     Abbreviations: M myopia (nearsighted); A astigmatism; H hyperopia (farsighted); P presbyopia; Mrx spectacle prescription;  CTL contact lenses; OD right eye; OS left eye; OU both eyes  XT exotropia; ET esotropia; PEK punctate epithelial keratitis; PEE punctate epithelial erosions; DES dry eye syndrome; MGD meibomian gland dysfunction; ATs artificial tears; PFAT's preservative free artificial tears; Start nuclear sclerotic cataract; PSC posterior subcapsular cataract; ERM epi-retinal membrane; PVD posterior vitreous detachment; RD retinal detachment; DM diabetes mellitus; DR diabetic retinopathy; NPDR non-proliferative  diabetic retinopathy; PDR proliferative diabetic retinopathy; CSME clinically significant macular edema; DME diabetic macular edema; dbh dot blot hemorrhages; CWS cotton wool spot; POAG primary open angle glaucoma; C/D cup-to-disc ratio; HVF humphrey visual field; GVF goldmann visual field; OCT optical coherence tomography; IOP intraocular pressure; BRVO Branch retinal vein occlusion; CRVO central retinal vein occlusion; CRAO central retinal artery occlusion; BRAO branch retinal artery occlusion; RT retinal tear; SB scleral buckle; PPV pars plana vitrectomy; VH Vitreous hemorrhage; PRP panretinal laser photocoagulation; IVK intravitreal kenalog; VMT vitreomacular traction; MH Macular hole;  NVD neovascularization of the disc; NVE neovascularization elsewhere; AREDS age related eye disease study; ARMD age related macular degeneration; POAG primary open angle glaucoma; EBMD epithelial/anterior basement membrane dystrophy; ACIOL anterior chamber intraocular lens; IOL intraocular lens; PCIOL posterior chamber intraocular lens; Phaco/IOL phacoemulsification with intraocular lens placement; [place] photorefractive keratectomy; LASIK laser assisted in situ keratomileusis; HTN hypertension; DM diabetes mellitus; COPD chronic obstructive pulmonary disease

## 2020-08-02 ENCOUNTER — Other Ambulatory Visit: Payer: Self-pay

## 2020-08-02 ENCOUNTER — Ambulatory Visit (HOSPITAL_COMMUNITY): Payer: Medicare Other | Attending: Cardiovascular Disease

## 2020-08-02 DIAGNOSIS — I340 Nonrheumatic mitral (valve) insufficiency: Secondary | ICD-10-CM

## 2020-08-02 DIAGNOSIS — I272 Pulmonary hypertension, unspecified: Secondary | ICD-10-CM

## 2020-08-02 DIAGNOSIS — E785 Hyperlipidemia, unspecified: Secondary | ICD-10-CM | POA: Diagnosis not present

## 2020-08-02 LAB — ECHOCARDIOGRAM COMPLETE
Area-P 1/2: 2.09 cm2
MV M vel: 5.53 m/s
MV Peak grad: 122.3 mmHg
Radius: 0.5 cm
S' Lateral: 2.8 cm

## 2020-08-11 ENCOUNTER — Ambulatory Visit: Payer: Medicare Other | Admitting: Cardiovascular Disease

## 2020-08-23 ENCOUNTER — Encounter: Payer: Self-pay | Admitting: Cardiovascular Disease

## 2020-08-23 ENCOUNTER — Other Ambulatory Visit: Payer: Self-pay

## 2020-08-23 ENCOUNTER — Ambulatory Visit (INDEPENDENT_AMBULATORY_CARE_PROVIDER_SITE_OTHER): Payer: Medicare Other | Admitting: Cardiovascular Disease

## 2020-08-23 VITALS — BP 162/86 | HR 62 | Ht 61.0 in | Wt 144.2 lb

## 2020-08-23 DIAGNOSIS — G4733 Obstructive sleep apnea (adult) (pediatric): Secondary | ICD-10-CM | POA: Diagnosis not present

## 2020-08-23 DIAGNOSIS — R002 Palpitations: Secondary | ICD-10-CM

## 2020-08-23 DIAGNOSIS — I340 Nonrheumatic mitral (valve) insufficiency: Secondary | ICD-10-CM

## 2020-08-23 DIAGNOSIS — I272 Pulmonary hypertension, unspecified: Secondary | ICD-10-CM | POA: Diagnosis not present

## 2020-08-23 DIAGNOSIS — I071 Rheumatic tricuspid insufficiency: Secondary | ICD-10-CM

## 2020-08-23 NOTE — Assessment & Plan Note (Signed)
History of moderate mitral vegetation with bileaflet prolapse by 2D echo a year ago.  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  There is mild to moderate TR as well.  We will repeat this in 12 months.

## 2020-08-23 NOTE — Assessment & Plan Note (Signed)
No longer an active problem

## 2020-08-23 NOTE — Assessment & Plan Note (Signed)
Apparently she had severe obstructive sleep apnea is currently on BiPAP and feels clinically improved.

## 2020-08-23 NOTE — Progress Notes (Signed)
08/23/2020 Tanya Leon   1941-08-28  [id number]  Primary Physician Tanya Glazier, MD Primary Cardiologist: Tanya Harp MD Tanya Leon, [place]  HPI:  Tanya Leon is a 79 y.o.  thin-appearing married Caucasian female mother of 2, grandmother, 4 grandchildren was accompanied who is husband Tanya Leon is a patient of mine as well.I last saw her in the office  08/03/2019 she has no cardiac risk factors other than her mother died of a myocardial infarction at age 12. She has never had a heart attack or stroke. She denies chest pain or shortness of breath. She has noticed palpitations over the last month with some occasional dizziness. EKG performed by her PCP revealed PVCs as did her EKG today. She was drinking 2 cups of coffee a day which she has changed to decaf. Thyroid function tests were normal.   Because of palpitations she did have a monitor which failed to show PVCs.  She says that her palpitations have resolved since I last saw her.  She had a 2D echo performed 07/27/2019 showed normal LV size and function, mild bileaflet prolapse with mild to moderate MR, and diastolic dysfunction with a RV systolic blood pressure wound which was mildly elevated at 35 mmHg.  She does exercise, walks up 3 flights of stairs at [facility] which is where she and her husband live does complain of some mild shortness of breath with with moderate exertion.    She apparently injured her ankle and no longer is playing pickle ball.  Since I saw her a year ago she has been diagnosed with severe obstructive sleep apnea and has been treated with BiPAP.  She feels clinically improved.  Her most recent 2D echo performed 08/02/2020 revealed normal LV systolic function, grade 1 diastolic dysfunction with only mild MR and posterior leaflet prolapse.  Her RV systolic pressure was 37 mmHg.  She denies chest pain or shortness of breath.  Recent blood work performed by her PCP at [facility]  on 07/06/2020 revealed total cholesterol 155, LDL 55 and HDL of 92.   Current Meds  Medication Sig  . amoxicillin (AMOXIL) 500 MG capsule TAKE 4 CAPSULES 1 HOUR PRIOR TO APPOINTMENT  . aspirin-acetaminophen-caffeine (EXCEDRIN EXTRA STRENGTH) 250-250-65 MG per tablet Take 2 tablets by mouth every 6 (six) hours as needed for headache.   . Azelaic Acid (FINACEA) 15 % cream Apply 1 application topically 2 (two) times daily. After skin is thoroughly washed and patted dry, gently but thoroughly massage a thin film of azelaic acid cream into the affected area twice daily, in the morning and evening.  Marland Kitchen azithromycin (AZASITE) 1 % ophthalmic solution Place 1 drop into both eyes See admin instructions. Apply drop onto each eye lid for 10 days out of each month.  . Biotin 5000 MCG TABS Take 5,000 mcg by mouth every evening.  . Brinzolamide-Brimonidine (SIMBRINZA) 1-0.2 % SUSP Place 1 drop into the left eye 2 (two) times a day.  . Calcium-Magnesium 500-250 MG TABS Take 1 tablet by mouth daily.  . carboxymethylcellulose (REFRESH PLUS) 0.5 % SOLN Place 1 drop into both eyes 3 (three) times daily as needed (Dry eyes).   . Cholecalciferol (VITAMIN D3) 50 MCG (2000 UT) TABS Take 2,000 Units by mouth every evening.  . Ciclopirox 1 % shampoo Apply 1 each topically 2 (two) times a week.   . cycloSPORINE (RESTASIS) 0.05 % ophthalmic emulsion Place 1 drop into both eyes 2 (two)  times daily.   . DULoxetine HCl 40 MG CPEP Take 1 capsule by mouth daily.  Marland Kitchen estradiol (VIVELLE-DOT) 0.1 MG/24HR patch Place 1 patch onto the skin 2 (two) times a week.   Marland Kitchen EVENING PRIMROSE OIL PO Take 1,300 mg by mouth 2 (two) times daily.  . fluocinolone (SYNALAR) 0.01 % external solution Apply topically.  . fluocinonide (LIDEX) 0.05 % external solution Apply 1 application topically 2 (two) times a week. Applied to scalp after shampooing   . fluticasone (FLONASE) 50 MCG/ACT nasal spray SPRAY 2 SPRAYS INTO EACH NOSTRIL EVERY DAY  . Lutein 20  MG TABS Take 20 mg by mouth 2 (two) times daily.   . Multiple Vitamin (MULTIVITAMIN WITH MINERALS) TABS tablet Take 1 tablet by mouth daily.  . Multiple Vitamins-Minerals (PRESERVISION AREDS 2 PO) Take 1 tablet by mouth 2 (two) times daily.  Marland Kitchen olopatadine (PATANOL) 0.1 % ophthalmic solution INSTILL 1 DROP INTO BOTH EYES TWICE A DAY  . omega-3 acid ethyl esters (LOVAZA) 1 G capsule Take 1,000 mg by mouth 2 (two) times daily.   . polyethylene glycol powder (GLYCOLAX/MIRALAX) powder Take 17 g by mouth daily.   . pregabalin (LYRICA) 25 MG capsule Take 1 capsule (25 mg total) by mouth 2 (two) times daily. TAKE 1 CAPSULE IN THE MORNING AND TAKE 1 CAPSULE IN THE EVENING (Patient taking differently: Take 25 mg by mouth 2 (two) times daily. TAKE 1CAPSULE IN THE MORNING AND TAKE 2  CAPSULE IN THE EVENING)  . tiZANidine (ZANAFLEX) 2 MG tablet TAKE 1 TABLET (2 MG TOTAL) BY MOUTH AT BEDTIME AS NEEDED.   Current Facility-Administered Medications for the 08/23/20 encounter (Office Visit) with Tanya Harp, MD  Medication  . 0.9 %  sodium chloride infusion     Allergies  Allergen Reactions  . Crab [Shellfish Allergy] Swelling    Soft shelled crab  . Doxycycline Rash    Social History   Socioeconomic History  . Marital status: Married    Spouse name: Not on file  . Number of children: 2  . Years of education: Not on file  . Highest education level: Bachelor's degree (e.g., BA, AB, BS)  Occupational History  . Occupation: cpa/retired    Fish farm manager: Retired  Tobacco Use  . Smoking status: Never Smoker  . Smokeless tobacco: Never Used  Vaping Use  . Vaping Use: Never used  Substance and Sexual Activity  . Alcohol use: Yes    Alcohol/week: 7.0 standard drinks    Types: 7 Glasses of wine per week    Comment: 1 glass wine with dinner most evenings  . Drug use: No  . Sexual activity: Yes    Birth control/protection: Post-menopausal  Other Topics Concern  . Not on file  Social History  Narrative   Married 83 years in 2015. 2 kids. 4 grandkids.       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 Previously [employer].    Social Determinants of Health   Financial Resource Strain:   . Difficulty of Paying Living Expenses: Not on file  Food Insecurity:   . Worried About Charity fundraiser in the Last Year: Not on file  . Ran Out of Food in the Last Year: Not on file  Transportation Needs:   . Lack of Transportation (Medical): Not on file  . Lack of Transportation (Non-Medical): Not on file  Physical Activity:   . Days of Exercise per Week:  Not on file  . Minutes of Exercise per Session: Not on file  Stress:   . Feeling of Stress : Not on file  Social Connections:   . Frequency of Communication with Friends and Family: Not on file  . Frequency of Social Gatherings with Friends and Family: Not on file  . Attends Religious Services: Not on file  . Active Member of Clubs or Organizations: Not on file  . Attends Archivist Meetings: Not on file  . Marital Status: Not on file  Intimate Partner Violence:   . Fear of Current or Ex-Partner: Not on file  . Emotionally Abused: Not on file  . Physically Abused: Not on file  . Sexually Abused: Not on file     Review of Systems: General: negative for chills, fever, night sweats or weight changes.  Cardiovascular: negative for chest pain, dyspnea on exertion, edema, orthopnea, palpitations, paroxysmal nocturnal dyspnea or shortness of breath Dermatological: negative for rash Respiratory: negative for cough or wheezing Urologic: negative for hematuria Abdominal: negative for nausea, vomiting, diarrhea, bright red blood per rectum, melena, or hematemesis Neurologic: negative for visual changes, syncope, or dizziness All other systems reviewed and are otherwise negative except as noted above.    Blood pressure (!) 162/86, pulse 62, height 5\' 1"  (1.549 m), weight 144 lb 3.2  oz (65.4 kg).  General appearance: alert and no distress Neck: no adenopathy, no carotid bruit, no JVD, supple, symmetrical, trachea midline and thyroid not enlarged, symmetric, no tenderness/mass/nodules Lungs: clear to auscultation bilaterally Heart: regular rate and rhythm, S1, S2 normal, no murmur, click, rub or gallop Extremities: extremities normal, atraumatic, no cyanosis or edema Pulses: 2+ and symmetric Skin: Skin color, texture, turgor normal. No rashes or lesions Neurologic: Alert and oriented X 3, normal strength and tone. Normal symmetric reflexes. Normal coordination and gait  EKG normal sinus rhythm at 62 with incomplete right bundle branch block and left axis deviation with nonspecific ST and T wave changes.  Personally reviewed this EKG.  ASSESSMENT AND PLAN:   Moderate mitral regurgitation History of moderate mitral vegetation with bileaflet prolapse by 2D echo a year ago.  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  There is mild to moderate TR as well.  We will repeat this in 12 months.  Pulmonary hypertension, unspecified ([place]) 2D echo performed 08/02/2020 showed mild increase in RV systolic pressure 37 mmHg.  This is unchanged.  Palpitations No longer an active problem  Obstructive sleep apnea Apparently she had severe obstructive sleep apnea is currently on BiPAP and feels clinically improved.      Tanya Harp MD FACP,FACC,FAHA, [facility] 08/23/2020 10:06 AM

## 2020-08-23 NOTE — Assessment & Plan Note (Signed)
2D echo performed 08/02/2020 showed mild increase in RV systolic pressure 37 mmHg.  This is unchanged.

## 2020-08-23 NOTE — Patient Instructions (Signed)
Medication Instructions:  Your physician recommends that you continue on your current medications as directed. Please refer to the Current Medication list given to you today.  *If you need a refill on your cardiac medications before your next appointment, please call your pharmacy*  Testing/Procedures: Echocardiogram in 1 year   Follow-Up: At [facility], you and your health needs are our priority.  As part of our continuing mission to provide you with exceptional heart care, we have created designated Provider Care Teams.  These Care Teams include your primary Cardiologist (physician) and Advanced Practice Providers (APPs -  Physician Assistants and Nurse Practitioners) who all work together to provide you with the care you need, when you need it.  We recommend signing up for the patient portal called "MyChart".  Sign up information is provided on this After Visit Summary.  MyChart is used to connect with patients for Virtual Visits (Telemedicine).  Patients are able to view lab/test results, encounter notes, upcoming appointments, etc.  Non-urgent messages can be sent to your provider as well.   To learn more about what you can do with MyChart, go to [email or web address].    Your next appointment:   12 month(s)  The format for your next appointment:   In Person  Provider:   You may see Quay Burow, MD or one of the following Advanced Practice Providers on your designated Care Team:    Kerin Ransom, PA-C  [place], [place]  Coletta Memos, [place]    Other Instructions

## 2020-09-18 ENCOUNTER — Other Ambulatory Visit: Payer: Self-pay | Admitting: Family Medicine

## 2020-09-20 DIAGNOSIS — G4733 Obstructive sleep apnea (adult) (pediatric): Secondary | ICD-10-CM | POA: Diagnosis not present

## 2020-09-20 DIAGNOSIS — G4730 Sleep apnea, unspecified: Secondary | ICD-10-CM | POA: Diagnosis not present

## 2020-09-20 DIAGNOSIS — I272 Pulmonary hypertension, unspecified: Secondary | ICD-10-CM | POA: Diagnosis not present

## 2021-01-15 ENCOUNTER — Ambulatory Visit (INDEPENDENT_AMBULATORY_CARE_PROVIDER_SITE_OTHER): Payer: Medicare Other | Admitting: Ophthalmology

## 2021-01-15 ENCOUNTER — Other Ambulatory Visit: Payer: Self-pay

## 2021-01-15 ENCOUNTER — Encounter (INDEPENDENT_AMBULATORY_CARE_PROVIDER_SITE_OTHER): Payer: Self-pay | Admitting: Ophthalmology

## 2021-01-15 DIAGNOSIS — G4733 Obstructive sleep apnea (adult) (pediatric): Secondary | ICD-10-CM | POA: Diagnosis not present

## 2021-01-15 DIAGNOSIS — H35072 Retinal telangiectasis, left eye: Secondary | ICD-10-CM | POA: Diagnosis not present

## 2021-01-15 DIAGNOSIS — H35071 Retinal telangiectasis, right eye: Secondary | ICD-10-CM

## 2021-01-15 NOTE — Assessment & Plan Note (Signed)
OS stable, on BiPAP now for the last 8 to 9 months, summer 2021, likely remained stable

## 2021-01-15 NOTE — Assessment & Plan Note (Signed)
Onset of therapy June 2021

## 2021-01-15 NOTE — Assessment & Plan Note (Signed)
OS stable, patient now on BiPAP not likely to progress

## 2021-01-15 NOTE — Progress Notes (Signed)
01/15/2021     CHIEF COMPLAINT Patient presents for Retina Follow Up (5 Mo F/U OU//Pt sts [facility] fluctuates from time to time OU. Pt denies ocular pain, flashes, or floaters OU.)   HISTORY OF PRESENT ILLNESS: Tanya Leon is a 80 y.o. female who presents to the clinic today for:   HPI    Retina Follow Up    Patient presents with  Other.  In both eyes.  This started 5 months ago.  Severity is mild.  Duration of 5 months.  Since onset it is stable. Additional comments: 5 Mo F/U OU  Pt sts VA fluctuates from time to time OU. Pt denies ocular pain, flashes, or floaters OU.       Last edited by [name], [place] on 01/15/2021  1:57 PM. (History)      Referring physician: [name], MD [place],  [place] [address]  HISTORICAL INFORMATION:   Selected notes from the MEDICAL RECORD NUMBER    Lab Results  Component Value Date   HGBA1C 5.0 04/27/2019     CURRENT MEDICATIONS: Current Outpatient Medications (Ophthalmic Drugs)  Medication Sig  . Brinzolamide-Brimonidine (SIMBRINZA) 1-0.2 % SUSP Place 1 drop into the left eye 2 (two) times a day.  . cycloSPORINE (RESTASIS) 0.05 % ophthalmic emulsion Place 1 drop into both eyes 2 (two) times daily.   Marland Kitchen olopatadine (PATANOL) 0.1 % ophthalmic solution INSTILL 1 DROP INTO BOTH EYES TWICE A DAY  . azithromycin (AZASITE) 1 % ophthalmic solution Place 1 drop into both eyes See admin instructions. Apply drop onto each eye lid for 10 days out of each month. (Patient not taking: Reported on 01/15/2021)  . carboxymethylcellulose (REFRESH PLUS) 0.5 % SOLN Place 1 drop into both eyes 3 (three) times daily as needed (Dry eyes).  (Patient not taking: Reported on 01/15/2021)   No current facility-administered medications for this visit. (Ophthalmic Drugs)   Current Outpatient Medications (Other)  Medication Sig  . amoxicillin (AMOXIL) 500 MG capsule TAKE 4 CAPSULES 1 HOUR PRIOR TO APPOINTMENT  . aspirin-acetaminophen-caffeine  (EXCEDRIN EXTRA STRENGTH) 250-250-65 MG per tablet Take 2 tablets by mouth every 6 (six) hours as needed for headache.   . Azelaic Acid (FINACEA) 15 % cream Apply 1 application topically 2 (two) times daily. After skin is thoroughly washed and patted dry, gently but thoroughly massage a thin film of azelaic acid cream into the affected area twice daily, in the morning and evening.  . Biotin 5000 MCG TABS Take 5,000 mcg by mouth every evening.  . Calcium-Magnesium 500-250 MG TABS Take 1 tablet by mouth daily.  . Cholecalciferol (VITAMIN D3) 50 MCG (2000 UT) TABS Take 2,000 Units by mouth every evening.  . Ciclopirox 1 % shampoo Apply 1 each topically 2 (two) times a week.   . DULoxetine HCl 40 MG CPEP Take 1 capsule by mouth daily.  Marland Kitchen estradiol (VIVELLE-DOT) 0.1 MG/24HR patch Place 1 patch onto the skin 2 (two) times a week.   Marland Kitchen EVENING PRIMROSE OIL PO Take 1,300 mg by mouth 2 (two) times daily.  . fluocinolone (SYNALAR) 0.01 % external solution Apply topically.  . fluocinonide (LIDEX) 0.05 % external solution Apply 1 application topically 2 (two) times a week. Applied to scalp after shampooing   . fluticasone (FLONASE) 50 MCG/ACT nasal spray SPRAY 2 SPRAYS INTO EACH NOSTRIL EVERY DAY  . Lutein 20 MG TABS Take 20 mg by mouth 2 (two) times daily.   Marland Kitchen morphine (MSIR) 15  MG tablet Take 0.5 tablets (7.5 mg total) by mouth every 4 (four) hours as needed for severe pain.  . Multiple Vitamin (MULTIVITAMIN WITH MINERALS) TABS tablet Take 1 tablet by mouth daily.  . Multiple Vitamins-Minerals (PRESERVISION AREDS 2 PO) Take 1 tablet by mouth 2 (two) times daily.  . NONFORMULARY OR COMPOUNDED ITEM [facility]:  Antifungal Nail Lacquer - Terbinafine 3%, Fluconazole 2%, Tea Tree Oil 5%, Urea 10%, Ibuprofen 2%, in DMSO suspension #29ml, apply to affected nails daily at bedtime or twice daily. (Patient taking differently: Apply 1 application topically at bedtime. [facility]:  Antifungal Nail Lacquer  - Terbinafine 3%, Fluconazole 2%, Tea Tree Oil 5%, Urea 10%, Ibuprofen 2%, in DMSO suspension #63ml, apply to affected nails daily at bedtime or twice daily.)  . omega-3 acid ethyl esters (LOVAZA) 1 G capsule Take 1,000 mg by mouth 2 (two) times daily.   . polyethylene glycol powder (GLYCOLAX/MIRALAX) powder Take 17 g by mouth daily.   . pregabalin (LYRICA) 25 MG capsule Take 1 capsule (25 mg total) by mouth 2 (two) times daily. TAKE 1 CAPSULE IN THE MORNING AND TAKE 1 CAPSULE IN THE EVENING (Patient taking differently: Take 25 mg by mouth 2 (two) times daily. TAKE 1CAPSULE IN THE MORNING AND TAKE 2  CAPSULE IN THE EVENING)  . tiZANidine (ZANAFLEX) 2 MG tablet TAKE 1 TABLET (2 MG TOTAL) BY MOUTH AT BEDTIME AS NEEDED.   Current Facility-Administered Medications (Other)  Medication Route  . 0.9 %  sodium chloride infusion Intravenous      REVIEW OF SYSTEMS:    ALLERGIES Allergies  Allergen Reactions  . Crab [Shellfish Allergy] Swelling    Soft shelled crab  . Doxycycline Rash    PAST MEDICAL HISTORY Past Medical History:  Diagnosis Date  . Allergy 1973   Seasonal  . Arthritis   . Blood transfusion without reported diagnosis 08/15/2008   Following hip replacement  . Cancer (HCC)    hx of skin cancer   . Cataract 1998   Cataract surgery both eyes  . Colon polyps    adenomatous  . Complication of anesthesia    " pt stated they gave me too much - kept having to be reminded to breathe   . Constipation    past hx- no longer an issue   . Depression    hx of   . Diverticulosis   . DIVERTICULOSIS, COLON 01/13/2009  . Glaucoma 05-12-14  . Headache    history of migraines  . Heart murmur    benign, dagnosed around age 40,prophylactic antibiotic  . Low back pain   . Lumbar spondylolysis   . Osteopenia   . Pneumonia    hx of as a baby   . Pulmonary hypertension ([place])    monitored by cards Dr Gwenlyn Found annually via echo   . PVC's (premature ventricular contractions)    i have  them periodically , Dr Gwenlyn Found had me cut back on caffeine and that helped   . Rectocele   . Sleep apnea    wears bi-pap, no oxygen   . Tubular adenoma of colon   . Wears hearing aid in both ears    Past Surgical History:  Procedure Laterality Date  . ABDOMINAL HERNIA REPAIR Right    incisional  . ABDOMINAL HYSTERECTOMY     with ovaries  . BACK SURGERY  2009   L4-L5 with rods  . BIOPSY  11/13/2018   Procedure: BIOPSY;  Surgeon: Jerene Bears, MD;  Location: [facility];  Service: Gastroenterology;;  . BLADDER SUSPENSION N/A 04/09/2017   Procedure: TRANSVAGINAL TAPE (TVT) PROCEDURE;  Surgeon: Bobbye Charleston, MD;  Location: [facility];  Service: Gynecology;  Laterality: N/A;  . CATARACT EXTRACTION, BILATERAL    . cataract left lens replacement     2016  . COLON SURGERY  2007   Rpr abscessed ruptured diverticuli  . COLONOSCOPY    . COLONOSCOPY N/A 08/29/2017   Procedure: COLONOSCOPY;  Surgeon: Jerene Bears, MD;  Location: [facility];  Service: Gastroenterology;  Laterality: N/A;  . COLONOSCOPY WITH PROPOFOL N/A 11/13/2018   Procedure: COLONOSCOPY WITH PROPOFOL with Methylane Blue Injection;  Surgeon: Jerene Bears, MD;  Location: [facility];  Service: Gastroenterology;  Laterality: N/A;  . CYSTOCELE REPAIR N/A 04/09/2017   Procedure: ANTERIOR REPAIR (CYSTOCELE);  Surgeon: Bobbye Charleston, MD;  Location: [facility];  Service: Gynecology;  Laterality: N/A;  . CYSTOSCOPY N/A 04/09/2017   Procedure: CYSTOSCOPY;  Surgeon: Bobbye Charleston, MD;  Location: [facility];  Service: Gynecology;  Laterality: N/A;  . EYE MUSCLE SURGERY    . FRACTURE SURGERY  11-13-17   L wrist-open reduction internal fixation  . GLAUCOMA SURGERY Left 05/12/14  . HERNIA REPAIR  05-28-11   Hernia result of 2007 colon surg  . incisional ab  05/28/11  . JOINT REPLACEMENT     right hip replacement   . LAPAROSCOPIC RIGHT HEMI COLECTOMY Right 04/30/2019   Procedure: LAPAROSCOPIC  RIGHT HEMI COLECTOMY;  Surgeon: Ileana Roup, MD;  Location: [facility];  Service: General;  Laterality: Right;  . LUMBAR FUSION     L4-L5  . PARTIAL KNEE ARTHROPLASTY Right   . PARTIAL KNEE ARTHROPLASTY Left   . perforated deverticular abscess/laporotomy and colostomy     depression after this  . POLYPECTOMY N/A 08/29/2017   Procedure: POLYPECTOMY;  Surgeon: Jerene Bears, MD;  Location: [facility];  Service: Gastroenterology;  Laterality: N/A;  . POLYPECTOMY  11/13/2018   Procedure: POLYPECTOMY;  Surgeon: Jerene Bears, MD;  Location: [facility];  Service: Gastroenterology;;  . POLYPECTOMY    . RECTOCELE REPAIR  01/13/2020  . SPINE SURGERY  02-16-08   Fusion L4L5  . stribismus eye surgery Right   . take down of colostomy    . tonsillectomly    . TONSILLECTOMY    . TOTAL HIP ARTHROPLASTY Right   . TOTAL KNEE REVISION Left 11/24/2015   Procedure: Conversion from left lateral unicompartmental knee arthroplasty to left total knee arthroplasty;  Surgeon: Mcarthur Rossetti, MD;  Location: [facility];  Service: Orthopedics;  Laterality: Left;  . TUBAL LIGATION  1976  . WRIST SURGERY Right 03/27/2020    FAMILY HISTORY Family History  Problem Relation Age of Onset  . Alzheimer's disease Father   . Heart disease Father        mitral valve replaced. unknown reason  . Breast cancer Mother        18  . Colon polyps Mother   . Diabetes Mother   . Heart disease Mother 74       CHF, MI 8  . Heart attack Mother        x 2  . Arthritis Mother   . Cancer Mother   . Hearing loss Mother   . Hypertension Mother   . Prostate cancer Brother   . Cancer Brother   . Hearing loss Brother   . Hypertension Brother   . Asthma Paternal Uncle   . Hypertension Sister   .  Colon cancer Neg Hx   . Esophageal cancer Neg Hx   . Rectal cancer Neg Hx   . Stomach cancer Neg Hx     SOCIAL HISTORY Social History   Tobacco Use  . Smoking status: Never Smoker  . Smokeless tobacco: Never Used  Vaping Use  . Vaping Use: Never used   Substance Use Topics  . Alcohol use: Yes    Alcohol/week: 7.0 standard drinks    Types: 7 Glasses of wine per week    Comment: 1 glass wine with dinner most evenings  . Drug use: No         OPHTHALMIC EXAM:  Base Eye Exam    Visual Acuity (ETDRS)      Right Left   Dist cc 20/30 -2 20/30 -2   Dist ph cc 20/20 -2 NI   Correction: Glasses       Tonometry (Tonopen, 2:03 PM)      Right Left   Pressure 18 16       Pupils      Pupils Dark Light Shape React APD   Right PERRL 5 4 Round Brisk None   Left PERRL 5 4 Round Brisk None       Visual Fields (Counting fingers)      Left Right    Full Full       Extraocular Movement      Right Left    Full Full       Neuro/Psych    Oriented x3: Yes   Mood/Affect: Normal       Dilation    Both eyes: 1.0% Mydriacyl, 2.5% Phenylephrine @ 2:03 PM        Slit Lamp and Fundus Exam    External Exam      Right Left   External Normal Normal       Slit Lamp Exam      Right Left   Lids/Lashes Normal Normal   Conjunctiva/Sclera White and quiet White and quiet   Cornea Clear Clear   Anterior Chamber Deep and quiet Deep and quiet   Iris Round and reactive Round and reactive   Lens Posterior chamber intraocular lens Posterior chamber intraocular lens   Anterior Vitreous Normal Normal       Fundus Exam      Right Left   Posterior Vitreous Posterior vitreous detachment Posterior vitreous detachment   Disc Peripapillary atrophy Peripapillary atrophy   C/D Ratio 0.05 0.05   Macula Hard drusen, no exudates, no hemorrhage, Geographic atrophy Hard drusen, no exudates, no hemorrhage, Geographic atrophy   Vessels Normal Normal   Periphery Normal Normal          IMAGING AND PROCEDURES  Imaging and Procedures for 01/15/21  OCT, Retina - OU - Both Eyes       Right Eye Quality was good. Scan locations included subfoveal. Central Foveal Thickness: 315. Progression has been stable. Findings include abnormal foveal contour.    Left Eye Quality was good. Scan locations included subfoveal. Central Foveal Thickness: 293. Progression has been stable. Findings include abnormal foveal contour.   Notes Stable over time, no active disease, no active Leakage is currently in either eye on BiPAP therapy will continue to observe                ASSESSMENT/PLAN:  Obstructive sleep apnea Onset of therapy June 2021  Retinal telangiectasia of left eye OS stable, patient now on BiPAP not likely to progress  Retinal telangiectasia of right  eye OS stable, on BiPAP now for the last 8 to 9 months, summer 2021, likely remained stable      ICD-10-CM   1. 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 Retinal telangiectasia of left eye  H35.072 OCT, Retina - OU - Both Eyes  3. Obstructive sleep apnea  G47.33     1.  Patient to continue on BiPAP therapy.  This will probably also slow prevent rapid progression of macular degenerative disease because of the avoidance of nighttime macular hypoxia associated with symptomatic and significant sleep apnea.  2.  Moreover perifoveal telangiectatic areas and minor perifoveal cystoid changes have stabilized we will continue to observe  3.  Ophthalmic Meds Ordered this visit:  No orders of the defined types were placed in this encounter.      Return in about 6 months (around 07/17/2021) for DILATE OU.  There are no Patient Instructions on file for this visit.   Explained the diagnoses, plan, and follow up with the patient and they expressed understanding.  Patient expressed understanding of the importance of proper follow up care.   Clent Demark Oshen Wlodarczyk M.D. Diseases & Surgery of the Retina and Vitreous [facility] [place] 01/15/21     Abbreviations: M myopia (nearsighted); A astigmatism; H hyperopia (farsighted); P presbyopia; Mrx spectacle prescription;  CTL contact lenses; OD right eye; OS left eye; OU both eyes  XT exotropia;  ET esotropia; PEK punctate epithelial keratitis; PEE punctate epithelial erosions; DES dry eye syndrome; MGD meibomian gland dysfunction; ATs artificial tears; PFAT's preservative free artificial tears; [place] nuclear sclerotic cataract; PSC posterior subcapsular cataract; ERM epi-retinal membrane; PVD posterior vitreous detachment; RD retinal detachment; DM diabetes mellitus; DR diabetic retinopathy; NPDR non-proliferative diabetic retinopathy; PDR proliferative diabetic retinopathy; CSME clinically significant macular edema; DME diabetic macular edema; dbh dot blot hemorrhages; CWS cotton wool spot; POAG primary open angle glaucoma; C/D cup-to-disc ratio; HVF humphrey visual field; GVF goldmann visual field; OCT optical coherence tomography; IOP intraocular pressure; BRVO Branch retinal vein occlusion; CRVO central retinal vein occlusion; CRAO central retinal artery occlusion; BRAO branch retinal artery occlusion; RT retinal tear; SB scleral buckle; PPV pars plana vitrectomy; VH Vitreous hemorrhage; PRP panretinal laser photocoagulation; IVK intravitreal kenalog; VMT vitreomacular traction; MH Macular hole;  NVD neovascularization of the disc; NVE neovascularization elsewhere; AREDS age related eye disease study; ARMD age related macular degeneration; POAG primary open angle glaucoma; EBMD epithelial/anterior basement membrane dystrophy; ACIOL anterior chamber intraocular lens; IOL intraocular lens; PCIOL posterior chamber intraocular lens; Phaco/IOL phacoemulsification with intraocular lens placement; [place] photorefractive keratectomy; LASIK laser assisted in situ keratomileusis; HTN hypertension; DM diabetes mellitus; COPD chronic obstructive pulmonary disease

## 2021-01-22 ENCOUNTER — Encounter (INDEPENDENT_AMBULATORY_CARE_PROVIDER_SITE_OTHER): Payer: Medicare Other | Admitting: Ophthalmology

## 2021-07-16 ENCOUNTER — Other Ambulatory Visit: Payer: Self-pay

## 2021-07-16 ENCOUNTER — Ambulatory Visit (INDEPENDENT_AMBULATORY_CARE_PROVIDER_SITE_OTHER): Payer: Medicare Other | Admitting: Ophthalmology

## 2021-07-16 ENCOUNTER — Encounter (INDEPENDENT_AMBULATORY_CARE_PROVIDER_SITE_OTHER): Payer: Self-pay | Admitting: Ophthalmology

## 2021-07-16 DIAGNOSIS — H35071 Retinal telangiectasis, right eye: Secondary | ICD-10-CM

## 2021-07-16 DIAGNOSIS — H10232 Serous conjunctivitis, except viral, left eye: Secondary | ICD-10-CM | POA: Diagnosis not present

## 2021-07-16 DIAGNOSIS — G4733 Obstructive sleep apnea (adult) (pediatric): Secondary | ICD-10-CM

## 2021-07-16 DIAGNOSIS — H35072 Retinal telangiectasis, left eye: Secondary | ICD-10-CM | POA: Diagnosis not present

## 2021-07-16 MED ORDER — LOTEPREDNOL ETABONATE 0.5 % OP SUSP: 1.0000 [drp] | Freq: Four times a day (QID) | OPHTHALMIC | 1 refills | Status: DC

## 2021-07-16 NOTE — Assessment & Plan Note (Signed)
Irregular macular contour stable over time will observe

## 2021-07-16 NOTE — Patient Instructions (Signed)
I have asked her to commence with topical therapy Lotemax 1 drop left eye 4 times daily with a tapering week by week as followed and and outlined in the instructions below  1 drop left eye 4 times daily for for 7 days followed thereafter  1 drop left eye 3 times daily for 7 days  1 drop left eye twice daily for 7 days  1 drop left eye once daily for 7 days then discontinue

## 2021-07-16 NOTE — Assessment & Plan Note (Addendum)
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

## 2021-07-16 NOTE — Assessment & Plan Note (Signed)
Patient continues on CPAP with excellent compliance

## 2021-07-16 NOTE — Progress Notes (Signed)
07/16/2021     CHIEF COMPLAINT Patient presents for  Chief Complaint  Patient presents with   Retina Follow Up      HISTORY OF PRESENT ILLNESS: Tanya Leon is a 80 y.o. female who presents to the clinic today for:   HPI     Retina Follow Up   Patient presents with  Other.  In both eyes.  This started 5 months ago.  Duration of 5 months.        Comments   5 month f/u OU with OCT  Pt c/o having more difficulty reading on her iPad, needing to get a bit closer. Pt denies any new flashes or floaters. Pt c/o the left eye feeling tender to the touch, present for the ~ 3 days.   EyeMeds: Simbrinza BID OS Restasis BID OU Olopatadine QD OU AREDS2      Last edited by Reather Littler, COA on 07/16/2021  1:12 PM.      Referring physician: [name], MD [place],  [place] [address]  HISTORICAL INFORMATION:   Selected notes from the MEDICAL RECORD NUMBER    Lab Results  Component Value Date   HGBA1C 5.0 04/27/2019     CURRENT MEDICATIONS: Current Outpatient Medications (Ophthalmic Drugs)  Medication Sig   loteprednol (LOTEMAX) 0.5 % ophthalmic suspension Place 1 drop into the left eye 4 (four) times daily.   azithromycin (AZASITE) 1 % ophthalmic solution Place 1 drop into both eyes See admin instructions. Apply drop onto each eye lid for 10 days out of each month. (Patient not taking: Reported on 01/15/2021)   Brinzolamide-Brimonidine (SIMBRINZA) 1-0.2 % SUSP Place 1 drop into the left eye 2 (two) times a day.   carboxymethylcellulose (REFRESH PLUS) 0.5 % SOLN Place 1 drop into both eyes 3 (three) times daily as needed (Dry eyes).  (Patient not taking: Reported on 01/15/2021)   cycloSPORINE (RESTASIS) 0.05 % ophthalmic emulsion Place 1 drop into both eyes 2 (two) times daily.    olopatadine (PATANOL) 0.1 % ophthalmic solution INSTILL 1 DROP INTO BOTH EYES TWICE A DAY   No current facility-administered medications for this visit. (Ophthalmic Drugs)    Current Outpatient Medications (Other)  Medication Sig   amoxicillin (AMOXIL) 500 MG capsule TAKE 4 CAPSULES 1 HOUR PRIOR TO APPOINTMENT   aspirin-acetaminophen-caffeine (EXCEDRIN EXTRA STRENGTH) 250-250-65 MG per tablet Take 2 tablets by mouth every 6 (six) hours as needed for headache.    Azelaic Acid (FINACEA) 15 % cream Apply 1 application topically 2 (two) times daily. After skin is thoroughly washed and patted dry, gently but thoroughly massage a thin film of azelaic acid cream into the affected area twice daily, in the morning and evening.   Biotin 5000 MCG TABS Take 5,000 mcg by mouth every evening.   Calcium-Magnesium 500-250 MG TABS Take 1 tablet by mouth daily.   Cholecalciferol (VITAMIN D3) 50 MCG (2000 UT) TABS Take 2,000 Units by mouth every evening.   Ciclopirox 1 % shampoo Apply 1 each topically 2 (two) times a week.    DULoxetine HCl 40 MG CPEP Take 1 capsule by mouth daily.   estradiol (VIVELLE-DOT) 0.1 MG/24HR patch Place 1 patch onto the skin 2 (two) times a week.    EVENING PRIMROSE OIL PO Take 1,300 mg by mouth 2 (two) times daily.   fluocinolone (SYNALAR) 0.01 % external solution Apply topically.   fluocinonide (LIDEX) 0.05 % external solution Apply 1 application topically 2 (two) times a  week. Applied to scalp after shampooing    fluticasone (FLONASE) 50 MCG/ACT nasal spray SPRAY 2 SPRAYS INTO EACH NOSTRIL EVERY DAY   Lutein 20 MG TABS Take 20 mg by mouth 2 (two) times daily.    morphine (MSIR) 15 MG tablet Take 0.5 tablets (7.5 mg total) by mouth every 4 (four) hours as needed for severe pain.   Multiple Vitamin (MULTIVITAMIN WITH MINERALS) TABS tablet Take 1 tablet by mouth daily.   Multiple Vitamins-Minerals (PRESERVISION AREDS 2 PO) Take 1 tablet by mouth 2 (two) times daily.   NONFORMULARY OR COMPOUNDED ITEM [facility]:  Antifungal Nail Lacquer - Terbinafine 3%, Fluconazole 2%, Tea Tree Oil 5%, Urea 10%, Ibuprofen 2%, in DMSO suspension #67ml, apply to  affected nails daily at bedtime or twice daily. (Patient taking differently: Apply 1 application topically at bedtime. [facility]:  Antifungal Nail Lacquer - Terbinafine 3%, Fluconazole 2%, Tea Tree Oil 5%, Urea 10%, Ibuprofen 2%, in DMSO suspension #93ml, apply to affected nails daily at bedtime or twice daily.)   omega-3 acid ethyl esters (LOVAZA) 1 G capsule Take 1,000 mg by mouth 2 (two) times daily.    polyethylene glycol powder (GLYCOLAX/MIRALAX) powder Take 17 g by mouth daily.    pregabalin (LYRICA) 25 MG capsule Take 1 capsule (25 mg total) by mouth 2 (two) times daily. TAKE 1 CAPSULE IN THE MORNING AND TAKE 1 CAPSULE IN THE EVENING (Patient taking differently: Take 25 mg by mouth 2 (two) times daily. TAKE 1CAPSULE IN THE MORNING AND TAKE 2  CAPSULE IN THE EVENING)   tiZANidine (ZANAFLEX) 2 MG tablet TAKE 1 TABLET (2 MG TOTAL) BY MOUTH AT BEDTIME AS NEEDED.   Current Facility-Administered Medications (Other)  Medication Route   0.9 %  sodium chloride infusion Intravenous      REVIEW OF SYSTEMS:    ALLERGIES Allergies  Allergen Reactions   Crab [Shellfish Allergy] Swelling    Soft shelled crab   Doxycycline Rash    PAST MEDICAL HISTORY Past Medical History:  Diagnosis Date   Allergy 1973   Seasonal   Arthritis    Blood transfusion without reported diagnosis 08/15/2008   Following hip replacement   Cancer ([place])    hx of skin cancer    Cataract 1998   Cataract surgery both eyes   Colon polyps    adenomatous   Complication of anesthesia    " pt stated they gave me too much - kept having to be reminded to breathe    Constipation    past hx- no longer an issue    Depression    hx of    Diverticulosis    DIVERTICULOSIS, COLON 01/13/2009   Glaucoma 05-12-14   Headache    history of migraines   Heart murmur    benign, dagnosed around age 53,prophylactic antibiotic   Low back pain    Lumbar spondylolysis    Osteopenia    Pneumonia    hx of as a baby     Pulmonary hypertension ([place])    monitored by cards Dr Gwenlyn Found annually via echo    PVC's (premature ventricular contractions)    i have them periodically , Dr Gwenlyn Found had me cut back on caffeine and that helped    Rectocele    Sleep apnea    wears bi-pap, no oxygen    Tubular adenoma of colon    Wears hearing aid in both ears    Past Surgical History:  Procedure Laterality Date   ABDOMINAL  HERNIA REPAIR Right    incisional   ABDOMINAL HYSTERECTOMY     with ovaries   BACK SURGERY  2009   L4-L5 with rods   BIOPSY  11/13/2018   Procedure: BIOPSY;  Surgeon: Jerene Bears, MD;  Location: [facility];  Service: Gastroenterology;;   BLADDER SUSPENSION N/A 04/09/2017   Procedure: TRANSVAGINAL TAPE (TVT) PROCEDURE;  Surgeon: Bobbye Charleston, MD;  Location: [facility];  Service: Gynecology;  Laterality: N/A;   CATARACT EXTRACTION, BILATERAL     cataract left lens replacement     2016   COLON SURGERY  2007   Rpr abscessed ruptured diverticuli   COLONOSCOPY     COLONOSCOPY N/A 08/29/2017   Procedure: COLONOSCOPY;  Surgeon: Jerene Bears, MD;  Location: [facility];  Service: Gastroenterology;  Laterality: N/A;   COLONOSCOPY WITH PROPOFOL N/A 11/13/2018   Procedure: COLONOSCOPY WITH PROPOFOL with Methylane Blue Injection;  Surgeon: Jerene Bears, MD;  Location: [facility];  Service: Gastroenterology;  Laterality: N/A;   CYSTOCELE REPAIR N/A 04/09/2017   Procedure: ANTERIOR REPAIR (CYSTOCELE);  Surgeon: Bobbye Charleston, MD;  Location: [facility];  Service: Gynecology;  Laterality: N/A;   CYSTOSCOPY N/A 04/09/2017   Procedure: CYSTOSCOPY;  Surgeon: Bobbye Charleston, MD;  Location: [facility];  Service: Gynecology;  Laterality: N/A;   EYE MUSCLE SURGERY     FRACTURE SURGERY  11-13-17   L wrist-open reduction internal fixation   GLAUCOMA SURGERY Left 05/12/14   HERNIA REPAIR  05-28-11   Hernia result of 2007 colon surg   incisional ab  05/28/11   JOINT REPLACEMENT     right hip replacement     LAPAROSCOPIC RIGHT HEMI COLECTOMY Right 04/30/2019   Procedure: LAPAROSCOPIC  RIGHT HEMI COLECTOMY;  Surgeon: Ileana Roup, MD;  Location: [facility];  Service: General;  Laterality: Right;   LUMBAR FUSION     L4-L5   PARTIAL KNEE ARTHROPLASTY Right    PARTIAL KNEE ARTHROPLASTY Left    perforated deverticular abscess/laporotomy and colostomy     depression after this   POLYPECTOMY N/A 08/29/2017   Procedure: POLYPECTOMY;  Surgeon: Jerene Bears, MD;  Location: [facility];  Service: Gastroenterology;  Laterality: N/A;   POLYPECTOMY  11/13/2018   Procedure: POLYPECTOMY;  Surgeon: Jerene Bears, MD;  Location: [facility];  Service: Gastroenterology;;   POLYPECTOMY     RECTOCELE REPAIR  01/13/2020   SPINE SURGERY  02-16-08   Fusion L4L5   stribismus eye surgery Right    take down of colostomy     tonsillectomly     TONSILLECTOMY     TOTAL HIP ARTHROPLASTY Right    TOTAL KNEE REVISION Left 11/24/2015   Procedure: Conversion from left lateral unicompartmental knee arthroplasty to left total knee arthroplasty;  Surgeon: Mcarthur Rossetti, MD;  Location: [facility];  Service: Orthopedics;  Laterality: Left;   TUBAL LIGATION  1976   WRIST SURGERY Right 03/27/2020    FAMILY HISTORY Family History  Problem Relation Age of Onset   Alzheimer's disease Father    Heart disease Father        mitral valve replaced. unknown reason   Breast cancer Mother        58   Colon polyps Mother    Diabetes Mother    Heart disease Mother 34       CHF, MI 51   Heart attack Mother        x 2   Arthritis Mother    Cancer Mother  Hearing loss Mother    Hypertension Mother    Prostate cancer Brother    Cancer Brother    Hearing loss Brother    Hypertension Brother    Asthma Paternal Uncle    Hypertension Sister    Colon cancer Neg Hx    Esophageal cancer Neg Hx    Rectal cancer Neg Hx    Stomach cancer Neg Hx     SOCIAL HISTORY Social History   Tobacco Use   Smoking status: Never    Smokeless tobacco: Never  Vaping Use   Vaping Use: Never used  Substance Use Topics   Alcohol use: Yes    Alcohol/week: 7.0 standard drinks    Types: 7 Glasses of wine per week    Comment: 1 glass wine with dinner most evenings   Drug use: No         OPHTHALMIC EXAM:  Base Eye Exam     Visual Acuity (ETDRS)       Right Left   Dist cc 20/25 -2 20/60 -1   Dist ph cc  20/40 -2    Correction: Glasses         Tonometry (Tonopen, 1:19 PM)       Right Left   Pressure 12 13         Pupils       Pupils Dark Light Shape React APD   Right PERRL 4 3 Round Brisk None   Left PERRL 5 4 Round Brisk +1         Visual Fields (Counting fingers)       Left Right    Full Full         Extraocular Movement       Right Left    Full, Ortho Full, Ortho         Neuro/Psych     Oriented x3: Yes   Mood/Affect: Normal         Dilation     Both eyes: 1.0% Mydriacyl, 2.5% Phenylephrine @ 1:19 PM           Slit Lamp and Fundus Exam     External Exam       Right Left   External Normal Normal         Slit Lamp Exam       Right Left   Lids/Lashes Normal Normal   Conjunctiva/Sclera White and quiet 1+ Injection, no limbal flush, Trace Chemosis   Cornea Clear Clear   Anterior Chamber Deep and quiet Deep and quiet, no cells and flare   Iris Round and reactive Round and reactive   Lens Posterior chamber intraocular lens Posterior chamber intraocular lens   Anterior Vitreous Normal Normal         Fundus Exam       Right Left   Posterior Vitreous Posterior vitreous detachment Posterior vitreous detachment   Disc Peripapillary atrophy Peripapillary atrophy   C/D Ratio 0.05 0.05   Macula Hard drusen, no exudates, no hemorrhage, Geographic atrophy Hard drusen, no exudates, no hemorrhage, Geographic atrophy   Vessels Normal Normal   Periphery Normal Normal            IMAGING AND PROCEDURES  Imaging and Procedures for 07/16/21  OCT, Retina -  OU - Both Eyes       Right Eye Quality was good. Scan locations included subfoveal. Central Foveal Thickness: 298. Progression has been stable. Findings include abnormal foveal contour.   Left Eye Quality was good. Scan  locations included subfoveal. Central Foveal Thickness: 309. Progression has been stable. Findings include abnormal foveal contour.   Notes Stable over time, no active disease, no active Leakage is currently in either eye on BiPAP therapy will continue to observe             ASSESSMENT/PLAN:  Serous non-viral conjunctivitis, left OS with no follicles in the conjunctiva yet with 1-2+ injection conjunctiva no cellular infiltrate in the anterior chamber, no SPK  Retinal telangiectasia of right eye Irregular macular contour stable over time will observe  Retinal telangiectasia of left eye Patient continues on CPAP with excellent compliance  Obstructive sleep apnea Patient continues on CPAP with excellent compliance     ICD-10-CM   1. Retinal telangiectasia of right eye  H35.071 OCT, Retina - OU - Both Eyes    2. Serous non-viral conjunctivitis, left  H10.232     3. Retinal telangiectasia of left eye  H35.072     4. Obstructive sleep apnea  G47.33       1.  OS we will commence with topical therapy Lotemax as a possible nonserious mild viral conjunctivitis.  Nonetheless I explained to the patient that could this could also be early allergy type change from one of her topical medications required for glaucoma control  2.  I have asked her to commence with topical therapy Lotemax 1 drop left eye 4 times daily with a tapering week by week as followed and and outlined in the instructions below  1 drop left eye 4 times daily for for 7 days followed thereafter  1 drop left eye 3 times daily for 7 days  1 drop left eye twice daily for 7 days  1 drop left eye once daily for 7 days then discontinue  3.  Ophthalmic Meds Ordered this visit:  Meds ordered this  encounter  Medications   loteprednol (LOTEMAX) 0.5 % ophthalmic suspension    Sig: Place 1 drop into the left eye 4 (four) times daily.    Dispense:  5 mL    Refill:  1       Return in about 5 weeks (around 08/20/2021) for NO DILATE.  Patient Instructions   I have asked her to commence with topical therapy Lotemax 1 drop left eye 4 times daily with a tapering week by week as followed and and outlined in the instructions below  1 drop left eye 4 times daily for for 7 days followed thereafter  1 drop left eye 3 times daily for 7 days  1 drop left eye twice daily for 7 days  1 drop left eye once daily for 7 days then discontinue   Explained the diagnoses, plan, and follow up with the patient and they expressed understanding.  Patient expressed understanding of the importance of proper follow up care.   Clent Demark Kayelynn Abdou M.D. Diseases & Surgery of the Retina and Vitreous [facility] [place] 07/16/21     Abbreviations: M myopia (nearsighted); A astigmatism; H hyperopia (farsighted); P presbyopia; Mrx spectacle prescription;  CTL contact lenses; OD right eye; OS left eye; OU both eyes  XT exotropia; ET esotropia; PEK punctate epithelial keratitis; PEE punctate epithelial erosions; DES dry eye syndrome; MGD meibomian gland dysfunction; ATs artificial tears; PFAT's preservative free artificial tears; [place] nuclear sclerotic cataract; PSC posterior subcapsular cataract; ERM epi-retinal membrane; PVD posterior vitreous detachment; RD retinal detachment; DM diabetes mellitus; DR diabetic retinopathy; NPDR non-proliferative diabetic retinopathy; PDR proliferative diabetic retinopathy; CSME clinically significant macular edema;  DME diabetic macular edema; dbh dot blot hemorrhages; CWS cotton wool spot; POAG primary open angle glaucoma; C/D cup-to-disc ratio; HVF humphrey visual field; GVF goldmann visual field; OCT optical coherence tomography; IOP intraocular pressure; BRVO Branch retinal  vein occlusion; CRVO central retinal vein occlusion; CRAO central retinal artery occlusion; BRAO branch retinal artery occlusion; RT retinal tear; SB scleral buckle; PPV pars plana vitrectomy; VH Vitreous hemorrhage; PRP panretinal laser photocoagulation; IVK intravitreal kenalog; VMT vitreomacular traction; MH Macular hole;  NVD neovascularization of the disc; NVE neovascularization elsewhere; AREDS age related eye disease study; ARMD age related macular degeneration; POAG primary open angle glaucoma; EBMD epithelial/anterior basement membrane dystrophy; ACIOL anterior chamber intraocular lens; IOL intraocular lens; PCIOL posterior chamber intraocular lens; Phaco/IOL phacoemulsification with intraocular lens placement; [place] photorefractive keratectomy; LASIK laser assisted in situ keratomileusis; HTN hypertension; DM diabetes mellitus; COPD chronic obstructive pulmonary disease

## 2021-07-23 ENCOUNTER — Other Ambulatory Visit: Payer: Self-pay

## 2021-07-23 ENCOUNTER — Ambulatory Visit (HOSPITAL_COMMUNITY): Payer: Medicare Other | Attending: Cardiovascular Disease

## 2021-07-23 DIAGNOSIS — I340 Nonrheumatic mitral (valve) insufficiency: Secondary | ICD-10-CM | POA: Insufficient documentation

## 2021-07-23 DIAGNOSIS — I071 Rheumatic tricuspid insufficiency: Secondary | ICD-10-CM | POA: Insufficient documentation

## 2021-07-23 LAB — ECHOCARDIOGRAM COMPLETE
Area-P 1/2: 2.28 cm2
S' Lateral: 2.6 cm

## 2021-07-25 ENCOUNTER — Telehealth: Payer: Self-pay

## 2021-07-25 NOTE — Telephone Encounter (Signed)
Left message for pt to call back  °

## 2021-07-25 NOTE — Telephone Encounter (Signed)
Spoke with pt regarding sooner appointment with Dr. Gwenlyn Found to discuss recent echo results. Pt verbalizes understanding.

## 2021-08-02 ENCOUNTER — Encounter (INDEPENDENT_AMBULATORY_CARE_PROVIDER_SITE_OTHER): Payer: Self-pay

## 2021-08-14 ENCOUNTER — Ambulatory Visit (INDEPENDENT_AMBULATORY_CARE_PROVIDER_SITE_OTHER): Payer: Medicare Other | Admitting: Cardiovascular Disease

## 2021-08-14 ENCOUNTER — Other Ambulatory Visit: Payer: Self-pay

## 2021-08-14 ENCOUNTER — Encounter: Payer: Self-pay | Admitting: Cardiovascular Disease

## 2021-08-14 DIAGNOSIS — I340 Nonrheumatic mitral (valve) insufficiency: Secondary | ICD-10-CM | POA: Diagnosis not present

## 2021-08-14 DIAGNOSIS — I272 Pulmonary hypertension, unspecified: Secondary | ICD-10-CM

## 2021-08-14 DIAGNOSIS — I493 Ventricular premature depolarization: Secondary | ICD-10-CM

## 2021-08-14 DIAGNOSIS — G4733 Obstructive sleep apnea (adult) (pediatric): Secondary | ICD-10-CM | POA: Diagnosis not present

## 2021-08-14 NOTE — Assessment & Plan Note (Signed)
History of mild pulmonary hypertension with RV systolic pressure of 41 mmHg by 2D echo performed 07/23/2021.  This has remained stable.

## 2021-08-14 NOTE — Assessment & Plan Note (Signed)
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  The left atrium is mild to moderately dilated.  LV function is normal LV dimensions are normal as well.  She is completely asymptomatic and is very active.  We talked about further evaluation including transesophageal echo and consideration of minimally invasive mitral valve repair but at this point I am comfortable following her by echocardiogram and clinically.

## 2021-08-14 NOTE — Progress Notes (Signed)
08/14/2021 Tanya Leon   1941/04/20  [id number]  Primary Physician [name], MD Primary Cardiologist: [name] MD [name], [place]  HPI:  Tanya Leon is a 80 y.o.    thin-appearing married Caucasian female mother of 2, grandmother, 4 grandchildren was accompanied who is husband Tanya Leon is a patient of mine as well.  I last saw her in the office 08/23/2020 she was she has no cardiac risk factors other than her mother died of a myocardial infarction at age 72. She has never had a heart attack or stroke. She denies chest pain or shortness of breath. She has noticed palpitations over the last month with some occasional dizziness. EKG performed by her PCP revealed PVCs as did her EKG today. She was drinking 2 cups of coffee a day which she has changed to decaf. Thyroid function tests were normal.      Because of palpitations she did have a monitor which failed to show PVCs.  She says that her palpitations have resolved since I last saw her.  She had a 2D echo performed 07/27/2019 showed normal LV size and function, mild bileaflet prolapse with mild to moderate MR, and diastolic dysfunction with a RV systolic blood pressure wound which was mildly elevated at 35 mmHg.  She does exercise, walks up 3 flights of stairs at [facility] which is where she and her husband live does complain of some mild shortness of breath with with moderate exertion.    She apparently injured her ankle and no longer is playing pickle ball.   Since I saw her a year ago she has been diagnosed with severe obstructive sleep apnea and has been treated with BiPAP.  She feels clinically improved.  A  2D echo performed 08/02/2020 revealed normal LV systolic function, grade 1 diastolic dysfunction with only mild MR and posterior leaflet prolapse.  Her RV systolic pressure was 37 mmHg.    Since I saw her a year ago she has continued to do well.  She is very active and is completely asymptomatic denying chest  pain or shortness of breath.  She did have a 2D echo performed 07/23/2021 revealing normal LV size and function with severe mitral regurgitation and posterior leaflet prolapse with mild to moderate left atrial dilatation and stable mild pulmonary hypertension with an RV systolic pressure of 42 mmHg.  Her most recent lipid profile performed 08/01/2021 revealed a total cholesterol of 51, LDL of 57 and HDL of 84.  Current Meds  Medication Sig   aspirin-acetaminophen-caffeine (EXCEDRIN MIGRAINE) 250-250-65 MG tablet Take 2 tablets by mouth every 6 (six) hours as needed for headache.    Biotin 5000 MCG TABS Take 5,000 mcg by mouth every evening.   Brinzolamide-Brimonidine (SIMBRINZA) 1-0.2 % SUSP Place 1 drop into the left eye 2 (two) times a day.   Calcium-Magnesium 500-250 MG TABS Take 1 tablet by mouth daily.   Cholecalciferol (VITAMIN D3) 50 MCG (2000 UT) TABS Take 2,000 Units by mouth every evening.   cycloSPORINE (RESTASIS) 0.05 % ophthalmic emulsion Place 1 drop into both eyes 2 (two) times daily.    DULoxetine HCl 40 MG CPEP Take 1 capsule by mouth daily.   estradiol (VIVELLE-DOT) 0.1 MG/24HR patch Place 1 patch onto the skin 2 (two) times a week.    EVENING PRIMROSE OIL PO Take 1,300 mg by mouth 2 (two) times daily.   fluocinolone (SYNALAR) 0.01 % external solution Apply topically.   fluticasone (FLONASE)  50 MCG/ACT nasal spray SPRAY 2 SPRAYS INTO EACH NOSTRIL EVERY DAY   Lutein 20 MG TABS Take 20 mg by mouth 2 (two) times daily.    Multiple Vitamin (MULTIVITAMIN WITH MINERALS) TABS tablet Take 1 tablet by mouth daily.   Multiple Vitamins-Minerals (PRESERVISION AREDS 2 PO) Take 1 tablet by mouth 2 (two) times daily.   olopatadine (PATANOL) 0.1 % ophthalmic solution INSTILL 1 DROP INTO BOTH EYES TWICE A DAY   omega-3 acid ethyl esters (LOVAZA) 1 G capsule Take 1,000 mg by mouth 2 (two) times daily.    polyethylene glycol powder (GLYCOLAX/MIRALAX) powder Take 17 g by mouth daily.    pregabalin  (LYRICA) 25 MG capsule Take 1 capsule (25 mg total) by mouth 2 (two) times daily. TAKE 1 CAPSULE IN THE MORNING AND TAKE 1 CAPSULE IN THE EVENING (Patient taking differently: Take 25 mg by mouth 2 (two) times daily. TAKE 1CAPSULE IN THE MORNING AND TAKE 2  CAPSULE IN THE EVENING)   tiZANidine (ZANAFLEX) 2 MG tablet TAKE 1 TABLET (2 MG TOTAL) BY MOUTH AT BEDTIME AS NEEDED.   Current Facility-Administered Medications for the 08/14/21 encounter (Office Visit) with [name], MD  Medication   0.9 %  sodium chloride infusion     Allergies  Allergen Reactions   Crab [Shellfish Allergy] Swelling    Soft shelled crab   Doxycycline Rash    Social History   Socioeconomic History   Marital status: Married    Spouse name: Not on file   Number of children: 2   Years of education: Not on file   Highest education level: Bachelor's degree (e.g., BA, AB, BS)  Occupational History   Occupation: cpa/retired    Fish farm manager: Retired  Tobacco Use   Smoking status: Never   Smokeless tobacco: Never  Vaping Use   Vaping Use: Never used  Substance and Sexual Activity   Alcohol use: Yes    Alcohol/week: 7.0 standard drinks    Types: 7 Glasses of wine per week    Comment: 1 glass wine with dinner most evenings   Drug use: No   Sexual activity: Yes    Birth control/protection: Post-menopausal  Other Topics Concern   Not on file  Social History Narrative   Married 76 years in 2015. 2 kids. 4 grandkids.       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 Previously [employer].    Social Determinants of Health   Financial Resource Strain: Not on file  Food Insecurity: Not on file  Transportation Needs: Not on file  Physical Activity: Not on file  Stress: Not on file  Social Connections: Not on file  Intimate Partner Violence: Not on file     Review of Systems: General: negative for chills, fever, night sweats or weight changes.  Cardiovascular:  negative for chest pain, dyspnea on exertion, edema, orthopnea, palpitations, paroxysmal nocturnal dyspnea or shortness of breath Dermatological: negative for rash Respiratory: negative for cough or wheezing Urologic: negative for hematuria Abdominal: negative for nausea, vomiting, diarrhea, bright red blood per rectum, melena, or hematemesis Neurologic: negative for visual changes, syncope, or dizziness All other systems reviewed and are otherwise negative except as noted above.    Blood pressure (!) 160/92, pulse 63, height 5\' 1"  (1.549 m), weight 139 lb (63 kg), SpO2 95 %.  General appearance: alert and no distress Neck: no adenopathy, no carotid bruit, no JVD, supple, symmetrical, trachea midline, and  thyroid not enlarged, symmetric, no tenderness/mass/nodules Lungs: clear to auscultation bilaterally Heart: regular rate and rhythm, S1, S2 normal, no murmur, click, rub or gallop Extremities: extremities normal, atraumatic, no cyanosis or edema Pulses: 2+ and symmetric Skin: Skin color, texture, turgor normal. No rashes or lesions Neurologic: Grossly normal  EKG normal sinus rhythm at 63 with left anterior fascicular block and poor R wave progression in the anterior precordial leads.  There was nonspecific ST and T wave changes.  I personally reviewed this EKG.  ASSESSMENT AND PLAN:   PVC's (premature ventricular contractions) History of PVCs in the past with palpitations which she no longer has symptomatic from.  Pulmonary hypertension, unspecified ([place]) History of mild pulmonary hypertension with RV systolic pressure of 41 mmHg by 2D echo performed 07/23/2021.  This has remained stable.  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  The left atrium is mild to moderately dilated.  LV function is normal LV dimensions are normal as well.  She is completely  asymptomatic and is very active.  We talked about further evaluation including transesophageal echo and consideration of minimally invasive mitral valve repair but at this point I am comfortable following her by echocardiogram and clinically.  Obstructive sleep apnea History of severe obstructive sleep apnea on BiPAP which she benefits from.     [name] MD FACP,FACC,FAHA, [facility] 08/14/2021 9:54 AM

## 2021-08-14 NOTE — Assessment & Plan Note (Signed)
History of severe obstructive sleep apnea on BiPAP which she benefits from.

## 2021-08-14 NOTE — Assessment & Plan Note (Signed)
History of PVCs in the past with palpitations which she no longer has symptomatic from.

## 2021-08-14 NOTE — Patient Instructions (Signed)
Medication Instructions:  Your physician recommends that you continue on your current medications as directed. Please refer to the Current Medication list given to you today.  *If you need a refill on your cardiac medications before your next appointment, please call your pharmacy*   Testing/Procedures: Your physician has requested that you have an echocardiogram. Echocardiography is a painless test that uses sound waves to create images of your heart. It provides your doctor with information about the size and shape of your heart and how well your heart's chambers and valves are working. This procedure takes approximately one hour. There are no restrictions for this procedure. To be done April/May 2023. This procedure is done at [address].     Follow-Up: At [facility], you and your health needs are our priority.  As part of our continuing mission to provide you with exceptional heart care, we have created designated Provider Care Teams.  These Care Teams include your primary Cardiologist (physician) and Advanced Practice Providers (APPs -  Physician Assistants and Nurse Practitioners) who all work together to provide you with the care you need, when you need it.  We recommend signing up for the patient portal called "MyChart".  Sign up information is provided on this After Visit Summary.  MyChart is used to connect with patients for Virtual Visits (Telemedicine).  Patients are able to view lab/test results, encounter notes, upcoming appointments, etc.  Non-urgent messages can be sent to your provider as well.   To learn more about what you can do with MyChart, go to [email or web address].    Your next appointment:   12 month(s)  The format for your next appointment:   In Person  Provider:   Quay Burow, MD

## 2021-08-21 NOTE — Addendum Note (Signed)
Addended by: Orma Render on: 08/21/2021 04:57 PM   Modules accepted: Orders

## 2021-08-27 ENCOUNTER — Encounter (INDEPENDENT_AMBULATORY_CARE_PROVIDER_SITE_OTHER): Payer: Self-pay | Admitting: Ophthalmology

## 2021-08-27 ENCOUNTER — Other Ambulatory Visit: Payer: Self-pay

## 2021-08-27 ENCOUNTER — Ambulatory Visit (INDEPENDENT_AMBULATORY_CARE_PROVIDER_SITE_OTHER): Payer: Medicare Other | Admitting: Ophthalmology

## 2021-08-27 DIAGNOSIS — G4733 Obstructive sleep apnea (adult) (pediatric): Secondary | ICD-10-CM

## 2021-08-27 DIAGNOSIS — H35071 Retinal telangiectasis, right eye: Secondary | ICD-10-CM

## 2021-08-27 DIAGNOSIS — H35072 Retinal telangiectasis, left eye: Secondary | ICD-10-CM

## 2021-08-27 DIAGNOSIS — H10232 Serous conjunctivitis, except viral, left eye: Secondary | ICD-10-CM | POA: Diagnosis not present

## 2021-08-27 NOTE — Progress Notes (Signed)
08/27/2021     CHIEF COMPLAINT Patient presents for  Chief Complaint  Patient presents with   Retina Follow Up      HISTORY OF PRESENT ILLNESS: Tanya Leon is a 80 y.o. female who presents to the clinic today for:   HPI     Retina Follow Up   Patient presents with  Other.  In right eye.  This started 6 weeks ago.  Severity is mild.  Duration of 6 weeks.  Since onset it is stable.        Comments   6 week fu ou and oct. No dilation. Pt states, "I was here last and he thought I had a virus or allergies in my left eye. I used the Pred Forte like he told me to and I completed that about 2 weeks ago. I think my eye is better than it was but it is always red so I cannot really tell." Pt states [facility] OU stable since last visit. Pt denies FOL, floaters, or ocular pain OU.  Pt continues on Restasis and Pataday OU and Simbrinza BID OS      Last edited by Kendra Opitz, COA on 08/27/2021  1:21 PM.      Referring physician: [name], MD [place],  [place] [id number]  HISTORICAL INFORMATION:   Selected notes from the MEDICAL RECORD NUMBER    Lab Results  Component Value Date   HGBA1C 5.0 04/27/2019     CURRENT MEDICATIONS: Current Outpatient Medications (Ophthalmic Drugs)  Medication Sig   Brinzolamide-Brimonidine (SIMBRINZA) 1-0.2 % SUSP Place 1 drop into the left eye 2 (two) times a day.   carboxymethylcellulose (REFRESH PLUS) 0.5 % SOLN Place 1 drop into both eyes 3 (three) times daily as needed (Dry eyes).  (Patient not taking: No sig reported)   cycloSPORINE (RESTASIS) 0.05 % ophthalmic emulsion Place 1 drop into both eyes 2 (two) times daily.    olopatadine (PATANOL) 0.1 % ophthalmic solution INSTILL 1 DROP INTO BOTH EYES TWICE A DAY   No current facility-administered medications for this visit. (Ophthalmic Drugs)   Current Outpatient Medications (Other)  Medication Sig   amoxicillin (AMOXIL) 500 MG capsule TAKE 4 CAPSULES 1 HOUR PRIOR TO  APPOINTMENT (Patient not taking: Reported on 08/14/2021)   aspirin-acetaminophen-caffeine (EXCEDRIN MIGRAINE) 250-250-65 MG tablet Take 2 tablets by mouth every 6 (six) hours as needed for headache.    Azelaic Acid 15 % gel Apply 1 application topically 2 (two) times daily. After skin is thoroughly washed and patted dry, gently but thoroughly massage a thin film of azelaic acid cream into the affected area twice daily, in the morning and evening.   Biotin 5000 MCG TABS Take 5,000 mcg by mouth every evening.   Calcium-Magnesium 500-250 MG TABS Take 1 tablet by mouth daily.   Cholecalciferol (VITAMIN D3) 50 MCG (2000 UT) TABS Take 2,000 Units by mouth every evening.   Ciclopirox 1 % shampoo Apply 1 each topically 2 (two) times a week.  (Patient not taking: Reported on 08/14/2021)   DULoxetine HCl 40 MG CPEP Take 1 capsule by mouth daily.   estradiol (VIVELLE-DOT) 0.1 MG/24HR patch Place 1 patch onto the skin 2 (two) times a week.    EVENING PRIMROSE OIL PO Take 1,300 mg by mouth 2 (two) times daily.   fluocinolone (SYNALAR) 0.01 % external solution Apply topically.   fluocinonide (LIDEX) 0.05 % external solution Apply 1 application topically 2 (two) times a week. Applied  to scalp after shampooing  (Patient not taking: Reported on 08/14/2021)   fluticasone (FLONASE) 50 MCG/ACT nasal spray SPRAY 2 SPRAYS INTO EACH NOSTRIL EVERY DAY   Lutein 20 MG TABS Take 20 mg by mouth 2 (two) times daily.    Multiple Vitamin (MULTIVITAMIN WITH MINERALS) TABS tablet Take 1 tablet by mouth daily.   Multiple Vitamins-Minerals (PRESERVISION AREDS 2 PO) Take 1 tablet by mouth 2 (two) times daily.   omega-3 acid ethyl esters (LOVAZA) 1 G capsule Take 1,000 mg by mouth 2 (two) times daily.    polyethylene glycol powder (GLYCOLAX/MIRALAX) powder Take 17 g by mouth daily.    pregabalin (LYRICA) 25 MG capsule Take 1 capsule (25 mg total) by mouth 2 (two) times daily. TAKE 1 CAPSULE IN THE MORNING AND TAKE 1 CAPSULE IN THE  EVENING (Patient taking differently: Take 25 mg by mouth 2 (two) times daily. TAKE 1CAPSULE IN THE MORNING AND TAKE 2  CAPSULE IN THE EVENING)   tiZANidine (ZANAFLEX) 2 MG tablet TAKE 1 TABLET (2 MG TOTAL) BY MOUTH AT BEDTIME AS NEEDED.   Current Facility-Administered Medications (Other)  Medication Route   0.9 %  sodium chloride infusion Intravenous      REVIEW OF SYSTEMS:    ALLERGIES Allergies  Allergen Reactions   Crab [Shellfish Allergy] Swelling    Soft shelled crab   Doxycycline Rash    PAST MEDICAL HISTORY Past Medical History:  Diagnosis Date   Allergy 1973   Seasonal   Arthritis    Blood transfusion without reported diagnosis 08/15/2008   Following hip replacement   Cancer (HCC)    hx of skin cancer    Cataract 1998   Cataract surgery both eyes   Colon polyps    adenomatous   Complication of anesthesia    " pt stated they gave me too much - kept having to be reminded to breathe    Constipation    past hx- no longer an issue    Depression    hx of    Diverticulosis    DIVERTICULOSIS, COLON 01/13/2009   Glaucoma 05-12-14   Headache    history of migraines   Heart murmur    benign, dagnosed around age 31,prophylactic antibiotic   Low back pain    Lumbar spondylolysis    Osteopenia    Pneumonia    hx of as a baby    Pulmonary hypertension ([place])    monitored by cards Dr Gwenlyn Found annually via echo    PVC's (premature ventricular contractions)    i have them periodically , Dr Gwenlyn Found had me cut back on caffeine and that helped    Rectocele    Sleep apnea    wears bi-pap, no oxygen    Tubular adenoma of colon    Wears hearing aid in both ears    Past Surgical History:  Procedure Laterality Date   ABDOMINAL HERNIA REPAIR Right    incisional   ABDOMINAL HYSTERECTOMY     with ovaries   BACK SURGERY  2009   L4-L5 with rods   BIOPSY  11/13/2018   Procedure: BIOPSY;  Surgeon: Jerene Bears, MD;  Location: [facility];  Service: Gastroenterology;;    BLADDER SUSPENSION N/A 04/09/2017   Procedure: TRANSVAGINAL TAPE (TVT) PROCEDURE;  Surgeon: Bobbye Charleston, MD;  Location: [facility];  Service: Gynecology;  Laterality: N/A;   CATARACT EXTRACTION, BILATERAL     cataract left lens replacement     2016   COLON SURGERY  2007   Rpr abscessed ruptured diverticuli   COLONOSCOPY     COLONOSCOPY N/A 08/29/2017   Procedure: COLONOSCOPY;  Surgeon: Jerene Bears, MD;  Location: [facility];  Service: Gastroenterology;  Laterality: N/A;   COLONOSCOPY WITH PROPOFOL N/A 11/13/2018   Procedure: COLONOSCOPY WITH PROPOFOL with Methylane Blue Injection;  Surgeon: Jerene Bears, MD;  Location: [facility];  Service: Gastroenterology;  Laterality: N/A;   CYSTOCELE REPAIR N/A 04/09/2017   Procedure: ANTERIOR REPAIR (CYSTOCELE);  Surgeon: Bobbye Charleston, MD;  Location: [facility];  Service: Gynecology;  Laterality: N/A;   CYSTOSCOPY N/A 04/09/2017   Procedure: CYSTOSCOPY;  Surgeon: Bobbye Charleston, MD;  Location: [facility];  Service: Gynecology;  Laterality: N/A;   EYE MUSCLE SURGERY     FRACTURE SURGERY  11-13-17   L wrist-open reduction internal fixation   GLAUCOMA SURGERY Left 05/12/14   HERNIA REPAIR  05-28-11   Hernia result of 2007 colon surg   incisional ab  05/28/11   JOINT REPLACEMENT     right hip replacement    LAPAROSCOPIC RIGHT HEMI COLECTOMY Right 04/30/2019   Procedure: LAPAROSCOPIC  RIGHT HEMI COLECTOMY;  Surgeon: Ileana Roup, MD;  Location: [facility];  Service: General;  Laterality: Right;   LUMBAR FUSION     L4-L5   PARTIAL KNEE ARTHROPLASTY Right    PARTIAL KNEE ARTHROPLASTY Left    perforated deverticular abscess/laporotomy and colostomy     depression after this   POLYPECTOMY N/A 08/29/2017   Procedure: POLYPECTOMY;  Surgeon: Jerene Bears, MD;  Location: [facility];  Service: Gastroenterology;  Laterality: N/A;   POLYPECTOMY  11/13/2018   Procedure: POLYPECTOMY;  Surgeon: Jerene Bears, MD;  Location: [facility];  Service:  Gastroenterology;;   POLYPECTOMY     RECTOCELE REPAIR  01/13/2020   SPINE SURGERY  02-16-08   Fusion L4L5   stribismus eye surgery Right    take down of colostomy     tonsillectomly     TONSILLECTOMY     TOTAL HIP ARTHROPLASTY Right    TOTAL KNEE REVISION Left 11/24/2015   Procedure: Conversion from left lateral unicompartmental knee arthroplasty to left total knee arthroplasty;  Surgeon: Mcarthur Rossetti, MD;  Location: [facility];  Service: Orthopedics;  Laterality: Left;   TUBAL LIGATION  1976   WRIST SURGERY Right 03/27/2020    FAMILY HISTORY Family History  Problem Relation Age of Onset   Alzheimer's disease Father    Heart disease Father        mitral valve replaced. unknown reason   Breast cancer Mother        23   Colon polyps Mother    Diabetes Mother    Heart disease Mother 19       CHF, MI 52   Heart attack Mother        x 2   Arthritis Mother    Cancer Mother    Hearing loss Mother    Hypertension Mother    Prostate cancer Brother    Cancer Brother    Hearing loss Brother    Hypertension Brother    Asthma Paternal Uncle    Hypertension Sister    Colon cancer Neg Hx    Esophageal cancer Neg Hx    Rectal cancer Neg Hx    Stomach cancer Neg Hx     SOCIAL HISTORY Social History   Tobacco Use   Smoking status: Never   Smokeless tobacco: Never  Vaping Use   Vaping Use: Never used  Substance Use Topics   Alcohol use: Yes    Alcohol/week: 7.0 standard drinks    Types: 7 Glasses of wine per week    Comment: 1 glass wine with dinner most evenings   Drug use: No         OPHTHALMIC EXAM:  Base Eye Exam     Visual Acuity (ETDRS)       Right Left   Dist cc 20/25 -1 20/50   Dist ph cc  20/40 +2    Correction: Glasses         Tonometry (Tonopen, 1:25 PM)       Right Left   Pressure 11 13         Pupils       Pupils Dark Light Shape React APD   Right PERRL 4 3 Round Brisk None   Left PERRL 5 4 Round Brisk +1         Visual  Fields (Counting fingers)       Left Right    Full Full         Extraocular Movement       Right Left    Full Full         Neuro/Psych     Oriented x3: Yes   Mood/Affect: Normal         Dilation     Both eyes: No Dilation @ 1:22 PM           Slit Lamp and Fundus Exam     External Exam       Right Left   External Normal Normal         Slit Lamp Exam       Right Left   Lids/Lashes Normal Normal   Conjunctiva/Sclera White and quiet 1+ Injection, no limbal flush, Trace Chemosis   Cornea Clear Clear   Anterior Chamber Deep and quiet Deep and quiet, no cells and flare   Iris Round and reactive Round and reactive   Lens Posterior chamber intraocular lens Posterior chamber intraocular lens   Anterior Vitreous Normal Normal         Fundus Exam       Right Left   Posterior Vitreous Posterior vitreous detachment Posterior vitreous detachment   Disc Peripapillary atrophy Peripapillary atrophy   C/D Ratio 0.05 0.05   Macula Hard drusen, no exudates, no hemorrhage, Geographic atrophy Hard drusen, no exudates, no hemorrhage, Geographic atrophy   Vessels Normal Normal   Periphery Normal Normal            IMAGING AND PROCEDURES  Imaging and Procedures for 08/27/21  OCT, Retina - OU - Both Eyes       Right Eye Quality was good. Scan locations included subfoveal. Central Foveal Thickness: 317. Progression has been stable. Findings include abnormal foveal contour.   Left Eye Quality was good. Scan locations included subfoveal. Central Foveal Thickness: 296. Progression has been stable. Findings include abnormal foveal contour.   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 Serous non-viral conjunctivitis, left  H10.232     3. Retinal telangiectasia of left eye  H35.072 OCT, Retina - OU - Both Eyes    4. Obstructive sleep apnea  G47.33       1.  OS with mild nonviral conjunctivitis now resolved after topical Lotemax on a tapering dose completed.  2.  3.  Ophthalmic Meds Ordered this visit:  No orders of the defined types were placed in this encounter.      Return in about 9 months (around 05/16/2022) for On 05/16/2022 with husband, DILATE OU, OCT, COLOR FP.  There are no Patient Instructions on file for this visit.   Explained the diagnoses, plan, and follow up with the patient and they expressed understanding.  Patient expressed understanding of the importance of proper follow up care.   Clent Demark Trivia Heffelfinger M.D. Diseases & Surgery of the Retina and Vitreous [facility] [place] 08/27/21     Abbreviations: M myopia (nearsighted); A astigmatism; H hyperopia (farsighted); P presbyopia; Mrx spectacle prescription;  CTL contact lenses; OD right eye; OS left eye; OU both eyes  XT exotropia; ET esotropia; PEK punctate epithelial keratitis; PEE punctate epithelial erosions; DES dry eye syndrome; MGD meibomian gland dysfunction; ATs artificial tears; PFAT's preservative free artificial tears; [place] nuclear sclerotic cataract; PSC posterior subcapsular cataract; ERM epi-retinal membrane; PVD posterior vitreous detachment; RD retinal detachment; DM diabetes mellitus; DR diabetic retinopathy; NPDR non-proliferative diabetic retinopathy; PDR proliferative diabetic retinopathy; CSME clinically significant macular edema; DME diabetic macular edema; dbh dot blot hemorrhages; CWS cotton wool spot; POAG primary open angle glaucoma; C/D cup-to-disc ratio; HVF humphrey visual field; GVF goldmann visual  field; OCT optical coherence tomography; IOP intraocular pressure; BRVO Branch retinal vein occlusion; CRVO central retinal vein occlusion; CRAO central retinal artery occlusion; BRAO branch retinal artery occlusion; RT retinal tear; SB scleral buckle; PPV pars plana vitrectomy; VH Vitreous hemorrhage; PRP panretinal laser photocoagulation; IVK intravitreal kenalog; VMT vitreomacular traction; MH Macular hole;  NVD neovascularization of the disc; NVE neovascularization elsewhere; AREDS age related eye disease study; ARMD age related macular degeneration; POAG primary open angle glaucoma; EBMD epithelial/anterior basement membrane dystrophy; ACIOL anterior chamber intraocular lens; IOL intraocular lens; PCIOL posterior chamber intraocular lens; Phaco/IOL phacoemulsification with intraocular lens placement; [place] photorefractive keratectomy; LASIK laser assisted in situ keratomileusis; HTN hypertension; DM diabetes mellitus; COPD chronic obstructive pulmonary disease

## 2021-08-27 NOTE — Assessment & Plan Note (Signed)
Stable OD, NOW on Cpap

## 2021-08-27 NOTE — Assessment & Plan Note (Signed)
Continues to be excellently compliant

## 2021-08-27 NOTE — Assessment & Plan Note (Signed)
Patient completed topical Lotemax in a tapering fashion no ongoing

## 2021-08-31 ENCOUNTER — Ambulatory Visit: Payer: Medicare Other | Admitting: Cardiovascular Disease

## 2021-09-20 ENCOUNTER — Other Ambulatory Visit: Payer: Self-pay | Admitting: Family Medicine

## 2022-01-03 ENCOUNTER — Ambulatory Visit (HOSPITAL_COMMUNITY): Payer: Medicare Other | Attending: Cardiovascular Disease

## 2022-01-03 DIAGNOSIS — I340 Nonrheumatic mitral (valve) insufficiency: Secondary | ICD-10-CM | POA: Insufficient documentation

## 2022-01-03 DIAGNOSIS — I272 Pulmonary hypertension, unspecified: Secondary | ICD-10-CM | POA: Insufficient documentation

## 2022-01-03 DIAGNOSIS — G4733 Obstructive sleep apnea (adult) (pediatric): Secondary | ICD-10-CM | POA: Diagnosis present

## 2022-01-03 DIAGNOSIS — I493 Ventricular premature depolarization: Secondary | ICD-10-CM

## 2022-01-03 LAB — ECHOCARDIOGRAM COMPLETE
Area-P 1/2: 3.08 cm2
MV M vel: 5.68 m/s
MV Peak grad: 129 mmHg
Radius: 0.7 cm
S' Lateral: 2.5 cm

## 2022-01-07 ENCOUNTER — Other Ambulatory Visit: Payer: Self-pay | Admitting: *Deleted

## 2022-01-07 DIAGNOSIS — I340 Nonrheumatic mitral (valve) insufficiency: Secondary | ICD-10-CM

## 2022-04-17 IMAGING — CT CT HEAD W/O CM
3 series · 16 of 47 positions shown, 19 images · non-contrast
Comparison: 04/05/2009

CLINICAL DATA: Fall today with blunt head trauma. Right hand
laceration and pain. Initial encounter.

EXAM:
CT HEAD WITHOUT CONTRAST
TECHNIQUE: Contiguous axial images were obtained from the base of the skull
through the vertex without intravenous contrast.

[Series 2: head wo · axial · 0.45mm/px · z∈[-174,-34]mm · 10 of 34 slices shown, 13 images]
[im 3/34  brain]
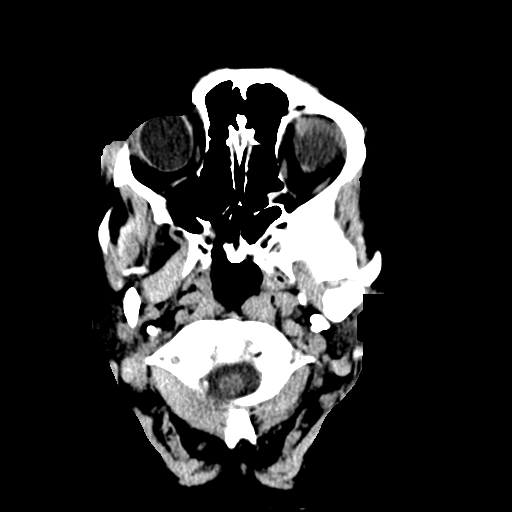
[im 3/34  bone]
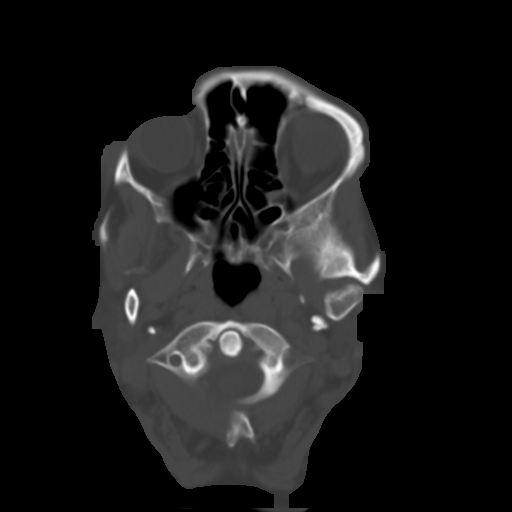
[im 6/34  brain]
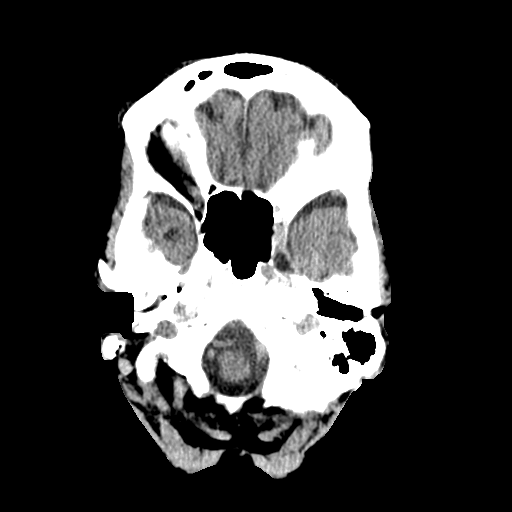
[im 10/34  brain]
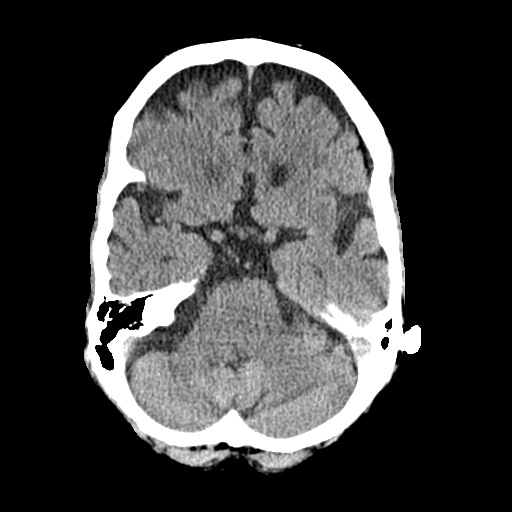
[im 12/34  brain]
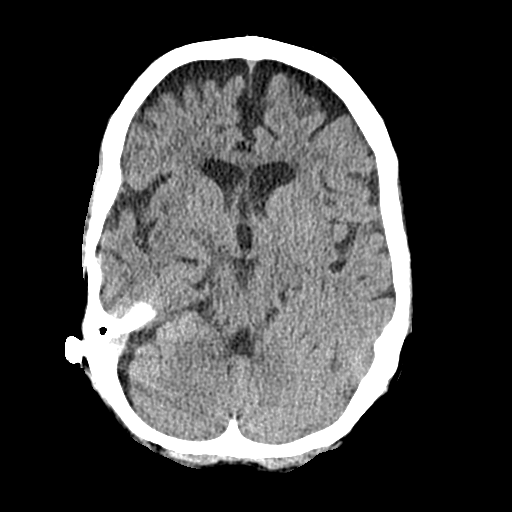
[im 15/34  brain]
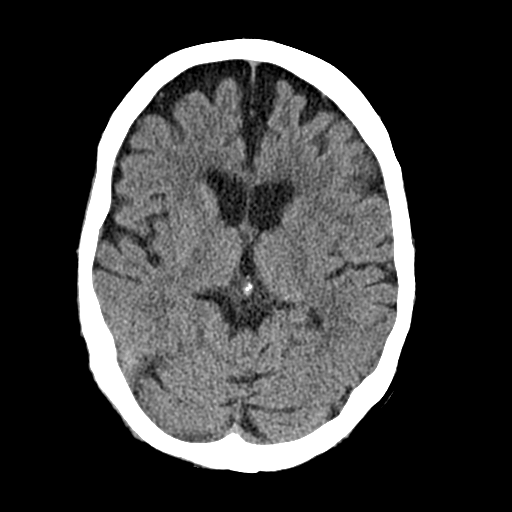
[im 15/34  bone]
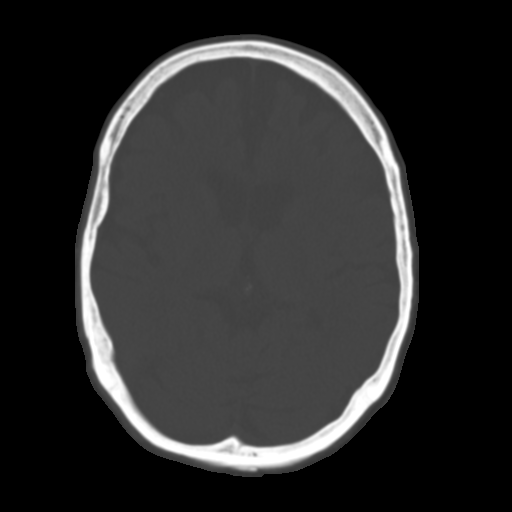
[im 19/34  brain]
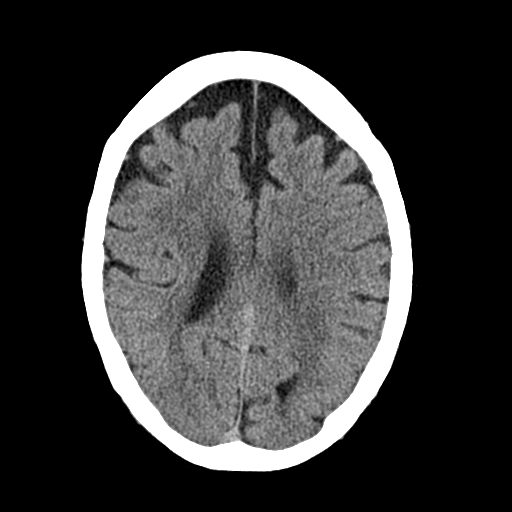
[im 22/34  brain]
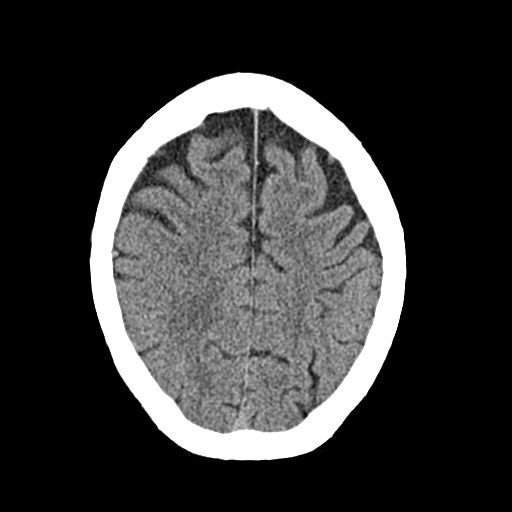
[im 26/34  brain]
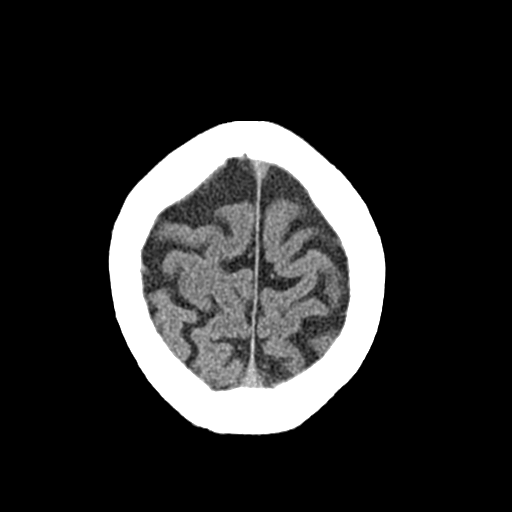
[im 28/34  brain]
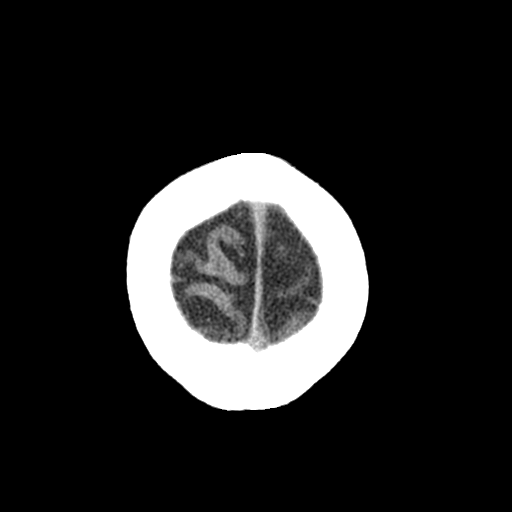
[im 28/34  bone]
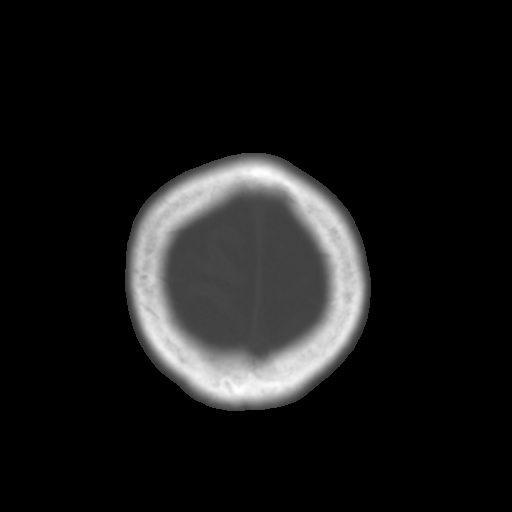
[im 31/34  brain]
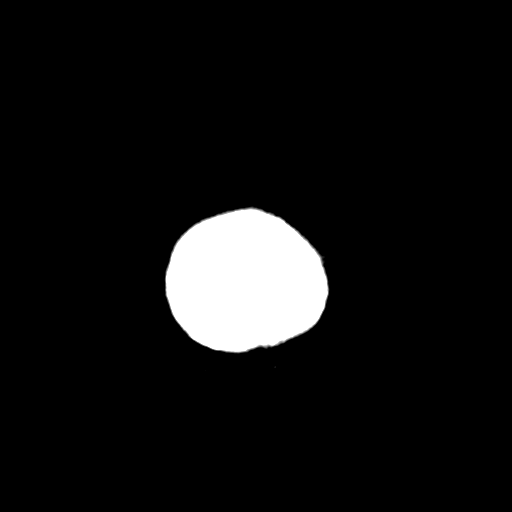

[Series 4: cor soft · coronal · 0.32mm/px · 3 of 70 slices shown]
[im 24/70  brain]
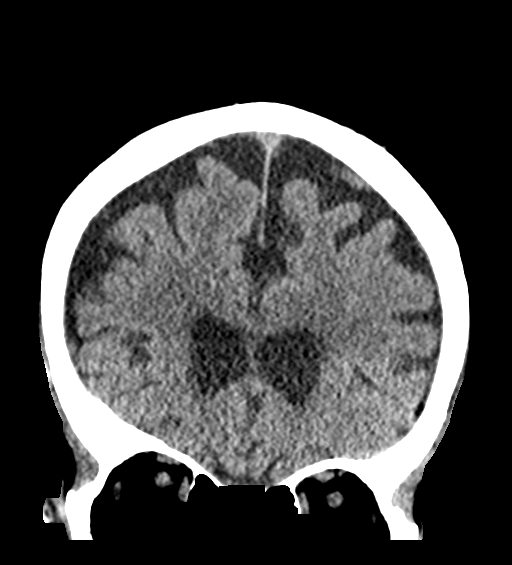
[im 31/70  brain]
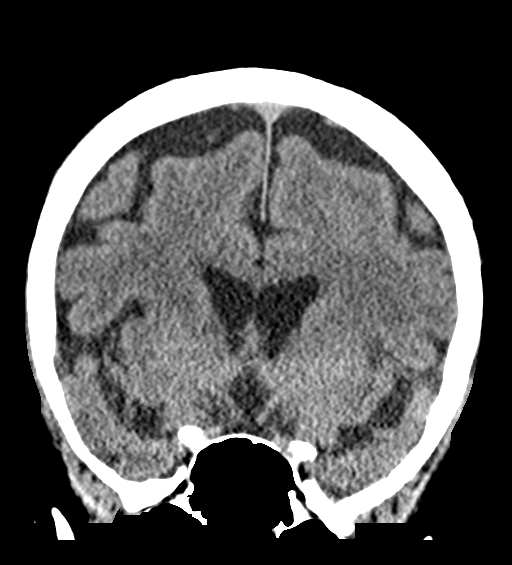
[im 39/70  brain]
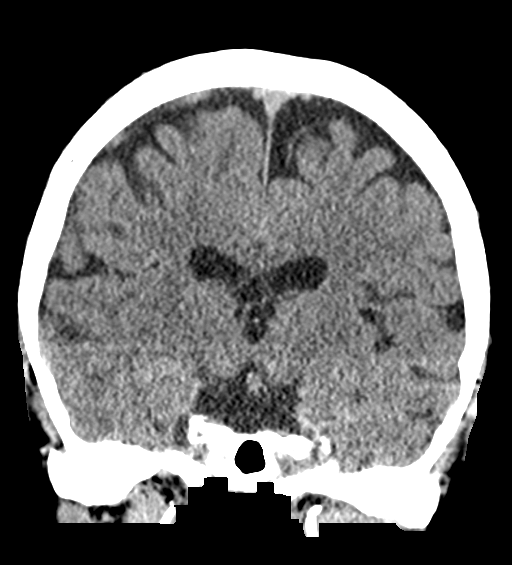

[Series 5: sag soft · sagittal · 0.36mm/px · 3 of 56 slices shown]
[im 19/56  brain]
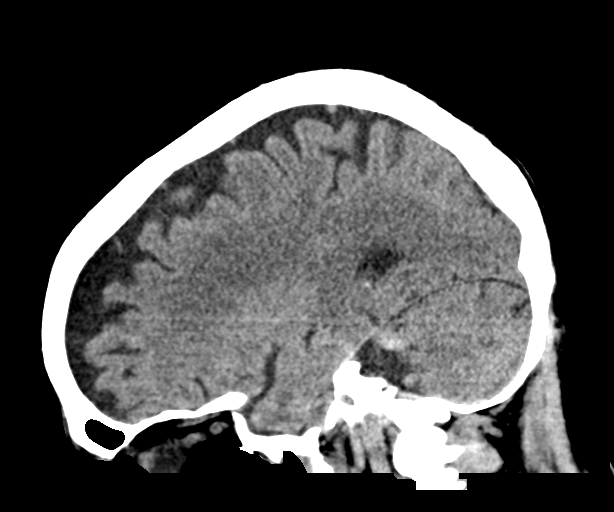
[im 28/56  brain]
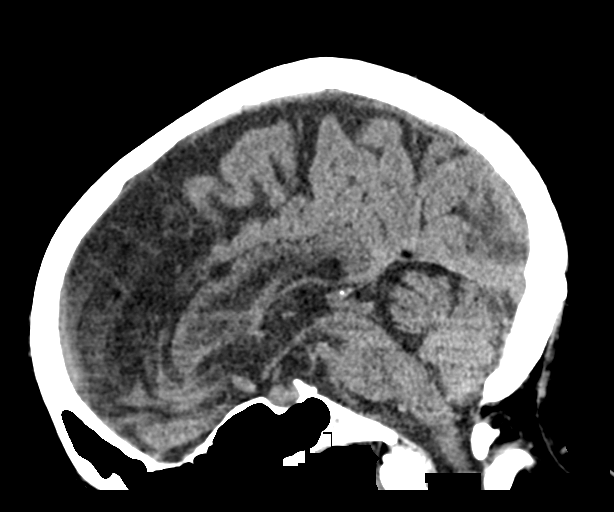
[im 37/56  brain]
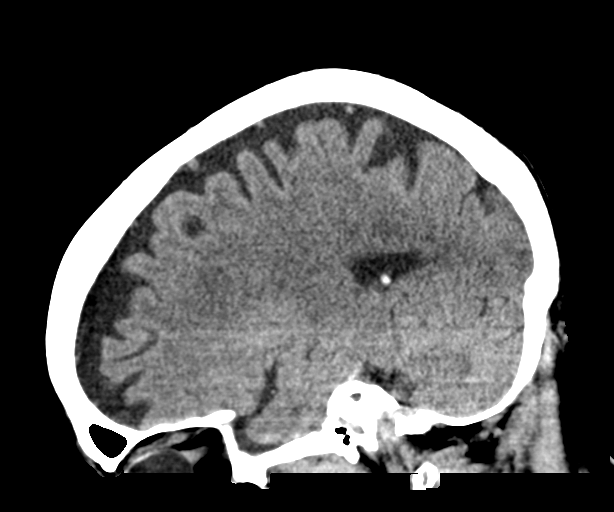

[16 of 47 positions shown; findings below may reference images not displayed]

FINDINGS: Brain: No evidence of acute infarction, hemorrhage, hydrocephalus,
extra-axial collection, or mass lesion/mass effect. Moderate diffuse
cerebral atrophy shows mild progression since previous study. Mild
chronic small vessel disease is also noted.

Vascular:  No hyperdense vessel or other acute findings.

Skull: No evidence of fracture or other significant bone
abnormality.

Sinuses/Orbits:  No acute findings.

Other: None.
IMPRESSION: 1. No acute intracranial abnormality.
2. Moderate cerebral atrophy and chronic small vessel disease.

## 2022-04-17 IMAGING — DX DG WRIST COMPLETE 3+V*R*
4 series · 4 of 4 positions shown · non-contrast
Comparison: None.

CLINICAL DATA: Right wrist pain after fall today.

EXAM:
RIGHT WRIST - COMPLETE 3+ VIEW

[wrist pa]
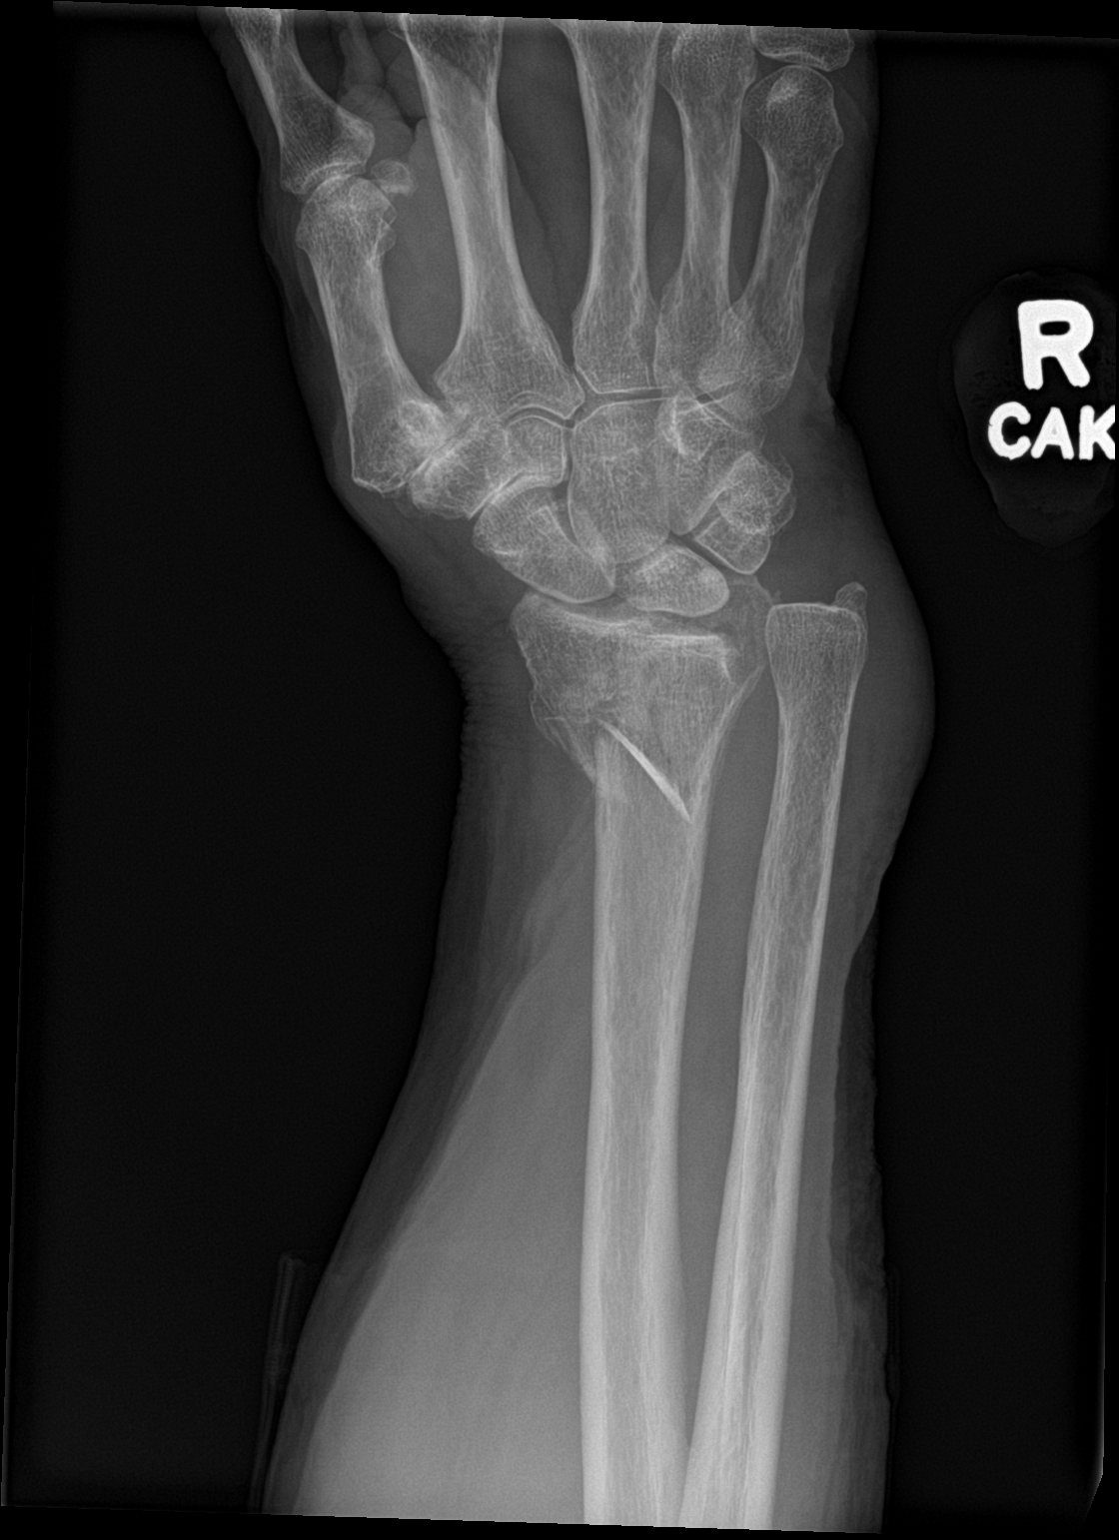

[wrist obl]
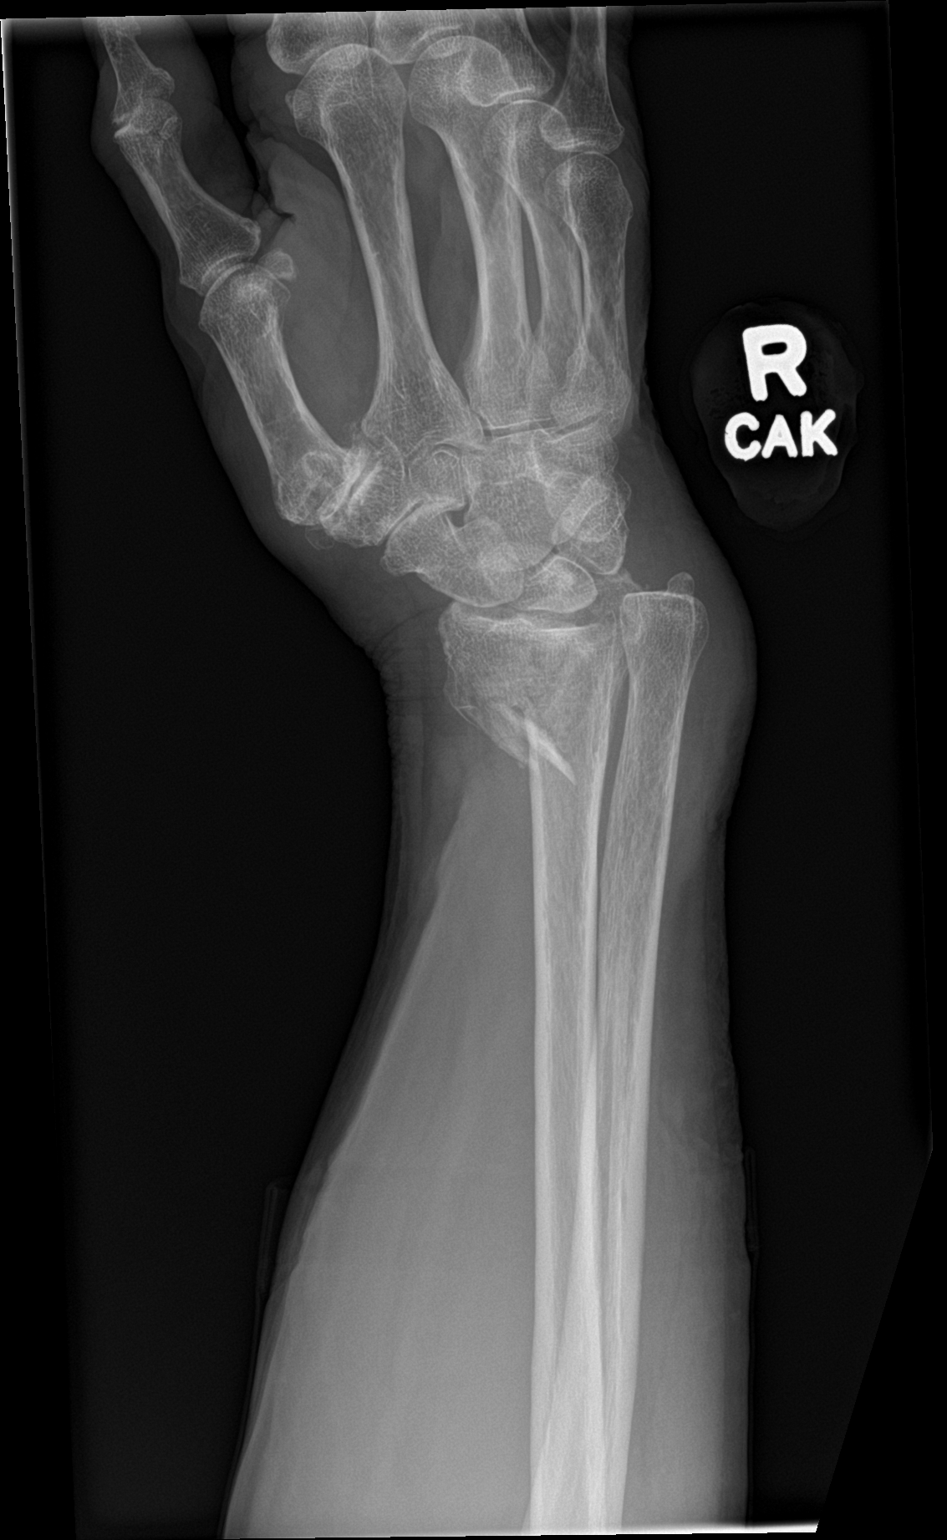

[wrist lat]
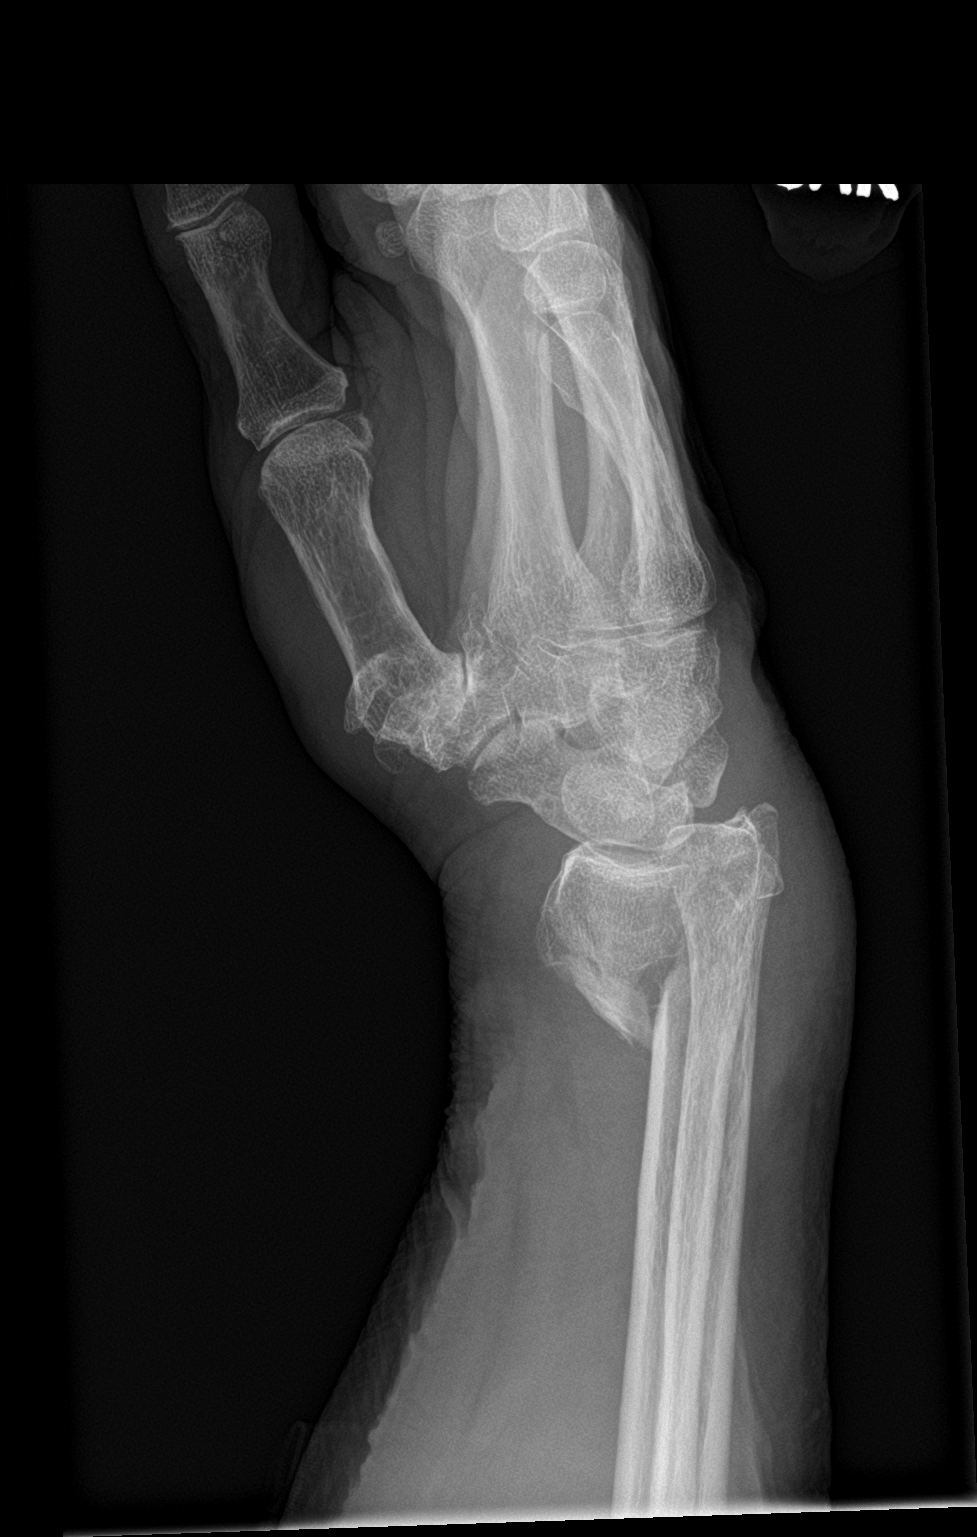

[wrist navicular]
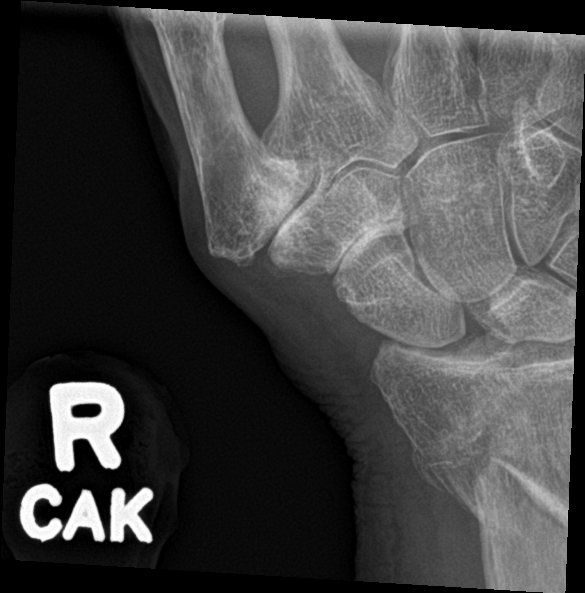

[4 of 4 positions shown; findings below may reference images not displayed]

FINDINGS: Severely displaced and comminuted fracture is seen involving the
distal radius. Intra-articular extension is noted. No soft tissue
abnormality is noted. Degenerative changes are seen involving the
first carpometacarpal joint as well as the adjacent intercarpal
joints.
IMPRESSION: Severely displaced and comminuted distal right radial fracture.

## 2022-05-16 ENCOUNTER — Encounter (INDEPENDENT_AMBULATORY_CARE_PROVIDER_SITE_OTHER): Payer: Medicare Other | Admitting: Ophthalmology

## 2022-05-16 ENCOUNTER — Encounter (INDEPENDENT_AMBULATORY_CARE_PROVIDER_SITE_OTHER): Payer: Self-pay

## 2022-05-16 ENCOUNTER — Ambulatory Visit (INDEPENDENT_AMBULATORY_CARE_PROVIDER_SITE_OTHER): Payer: Medicare Other | Admitting: Ophthalmology

## 2022-05-16 ENCOUNTER — Encounter (INDEPENDENT_AMBULATORY_CARE_PROVIDER_SITE_OTHER): Payer: Self-pay | Admitting: Ophthalmology

## 2022-05-16 DIAGNOSIS — H35071 Retinal telangiectasis, right eye: Secondary | ICD-10-CM

## 2022-05-16 DIAGNOSIS — H35072 Retinal telangiectasis, left eye: Secondary | ICD-10-CM | POA: Diagnosis not present

## 2022-05-16 DIAGNOSIS — H35372 Puckering of macula, left eye: Secondary | ICD-10-CM

## 2022-05-16 NOTE — Assessment & Plan Note (Signed)
OD with irregular contour of macula overall stable

## 2022-05-16 NOTE — Assessment & Plan Note (Signed)
OS stable as compared OCT 2019./Intraretinal fluid appearance.

## 2022-05-16 NOTE — Progress Notes (Signed)
05/16/2022     CHIEF COMPLAINT Patient presents for  Chief Complaint  Patient presents with   Retina Follow Up      HISTORY OF PRESENT ILLNESS: Tanya Leon is a 81 y.o. female who presents to the clinic today for:   HPI     Retina Follow Up           Diagnosis: Other   Laterality: right eye   Severity: moderate   Course: stable         Comments   8 MOS for DILATE OU, COLOR FP. Pt stated, "I find myself having to work my eyes harder when I'm using my iPAD. The words seem to become blurry and its been going on for almost a year now."       Last edited by Silvestre Moment on 05/16/2022  1:33 PM.      Referring physician: Roel Cluck, MD [address] [place],  [place] [address]  HISTORICAL INFORMATION:   Selected notes from the [facility]    Lab Results  Component Value Date   HGBA1C 5.0 04/27/2019     CURRENT MEDICATIONS: Current Outpatient Medications (Ophthalmic Drugs)  Medication Sig   Brinzolamide-Brimonidine (SIMBRINZA) 1-0.2 % SUSP Place 1 drop into the left eye 2 (two) times a day.   carboxymethylcellulose (REFRESH PLUS) 0.5 % SOLN Place 1 drop into both eyes 3 (three) times daily as needed (Dry eyes).  (Patient not taking: No sig reported)   cycloSPORINE (RESTASIS) 0.05 % ophthalmic emulsion Place 1 drop into both eyes 2 (two) times daily.    olopatadine (PATANOL) 0.1 % ophthalmic solution INSTILL 1 DROP INTO BOTH EYES TWICE A DAY   No current facility-administered medications for this visit. (Ophthalmic Drugs)   Current Outpatient Medications (Other)  Medication Sig   amoxicillin (AMOXIL) 500 MG capsule TAKE 4 CAPSULES 1 HOUR PRIOR TO APPOINTMENT (Patient not taking: Reported on 08/14/2021)   aspirin-acetaminophen-caffeine (EXCEDRIN MIGRAINE) 250-250-65 MG tablet Take 2 tablets by mouth every 6 (six) hours as needed for headache.    Azelaic Acid 15 % gel Apply 1 application topically 2 (two) times daily. After skin is thoroughly  washed and patted dry, gently but thoroughly massage a thin film of azelaic acid cream into the affected area twice daily, in the morning and evening.   Biotin 5000 MCG TABS Take 5,000 mcg by mouth every evening.   Calcium-Magnesium 500-250 MG TABS Take 1 tablet by mouth daily.   Cholecalciferol (VITAMIN D3) 50 MCG (2000 UT) TABS Take 2,000 Units by mouth every evening.   Ciclopirox 1 % shampoo Apply 1 each topically 2 (two) times a week.  (Patient not taking: Reported on 08/14/2021)   DULoxetine HCl 40 MG CPEP Take 1 capsule by mouth daily.   estradiol (VIVELLE-DOT) 0.1 MG/24HR patch Place 1 patch onto the skin 2 (two) times a week.    EVENING PRIMROSE OIL PO Take 1,300 mg by mouth 2 (two) times daily.   fluocinolone (SYNALAR) 0.01 % external solution Apply topically.   fluocinonide (LIDEX) 0.05 % external solution Apply 1 application topically 2 (two) times a week. Applied to scalp after shampooing  (Patient not taking: Reported on 08/14/2021)   fluticasone (FLONASE) 50 MCG/ACT nasal spray SPRAY 2 SPRAYS INTO EACH NOSTRIL EVERY DAY   Lutein 20 MG TABS Take 20 mg by mouth 2 (two) times daily.    Multiple Vitamin (MULTIVITAMIN WITH MINERALS) TABS tablet Take 1 tablet by mouth daily.  Multiple Vitamins-Minerals (PRESERVISION AREDS 2 PO) Take 1 tablet by mouth 2 (two) times daily.   omega-3 acid ethyl esters (LOVAZA) 1 G capsule Take 1,000 mg by mouth 2 (two) times daily.    polyethylene glycol powder (GLYCOLAX/MIRALAX) powder Take 17 g by mouth daily.    pregabalin (LYRICA) 25 MG capsule Take 1 capsule (25 mg total) by mouth 2 (two) times daily. TAKE 1 CAPSULE IN THE MORNING AND TAKE 1 CAPSULE IN THE EVENING (Patient taking differently: Take 25 mg by mouth 2 (two) times daily. TAKE 1CAPSULE IN THE MORNING AND TAKE 2  CAPSULE IN THE EVENING)   tiZANidine (ZANAFLEX) 2 MG tablet TAKE 1 TABLET (2 MG TOTAL) BY MOUTH AT BEDTIME AS NEEDED.   Current Facility-Administered Medications (Other)  Medication  Route   0.9 %  sodium chloride infusion Intravenous      REVIEW OF SYSTEMS: ROS   Negative for: Constitutional, Gastrointestinal, Neurological, Skin, Genitourinary, Musculoskeletal, HENT, Endocrine, Cardiovascular, Eyes, Respiratory, Psychiatric, Allergic/Imm, Heme/Lymph Last edited by Silvestre Moment on 05/16/2022  1:34 PM.       ALLERGIES Allergies  Allergen Reactions   Crab [Shellfish Allergy] Swelling    Soft shelled crab   Doxycycline Rash    PAST MEDICAL HISTORY Past Medical History:  Diagnosis Date   Allergy 1973   Seasonal   Arthritis    Blood transfusion without reported diagnosis 08/15/2008   Following hip replacement   Cancer (HCC)    hx of skin cancer    Cataract 1998   Cataract surgery both eyes   Colon polyps    adenomatous   Complication of anesthesia    " pt stated they gave me too much - kept having to be reminded to breathe    Constipation    past hx- no longer an issue    Depression    hx of    Diverticulosis    DIVERTICULOSIS, COLON 01/13/2009   Glaucoma 05-12-14   Headache    history of migraines   Heart murmur    benign, dagnosed around age 57,prophylactic antibiotic   Low back pain    Lumbar spondylolysis    Osteopenia    Pneumonia    hx of as a baby    Pulmonary hypertension ([place])    monitored by cards Dr Gwenlyn Found annually via echo    PVC's (premature ventricular contractions)    i have them periodically , Dr Gwenlyn Found had me cut back on caffeine and that helped    Rectocele    Sleep apnea    wears bi-pap, no oxygen    Tubular adenoma of colon    Wears hearing aid in both ears    Past Surgical History:  Procedure Laterality Date   ABDOMINAL HERNIA REPAIR Right    incisional   ABDOMINAL HYSTERECTOMY     with ovaries   BACK SURGERY  2009   L4-L5 with rods   BIOPSY  11/13/2018   Procedure: BIOPSY;  Surgeon: Jerene Bears, MD;  Location: [facility];  Service: Gastroenterology;;   BLADDER SUSPENSION N/A 04/09/2017   Procedure: TRANSVAGINAL  TAPE (TVT) PROCEDURE;  Surgeon: Bobbye Charleston, MD;  Location: [facility];  Service: Gynecology;  Laterality: N/A;   CATARACT EXTRACTION, BILATERAL     cataract left lens replacement     2016   COLON SURGERY  2007   Rpr abscessed ruptured diverticuli   COLONOSCOPY     COLONOSCOPY N/A 08/29/2017   Procedure: COLONOSCOPY;  Surgeon: Jerene Bears, MD;  Location: [facility];  Service: Gastroenterology;  Laterality: N/A;   COLONOSCOPY WITH PROPOFOL N/A 11/13/2018   Procedure: COLONOSCOPY WITH PROPOFOL with Methylane Blue Injection;  Surgeon: Jerene Bears, MD;  Location: [facility];  Service: Gastroenterology;  Laterality: N/A;   CYSTOCELE REPAIR N/A 04/09/2017   Procedure: ANTERIOR REPAIR (CYSTOCELE);  Surgeon: Bobbye Charleston, MD;  Location: [facility];  Service: Gynecology;  Laterality: N/A;   CYSTOSCOPY N/A 04/09/2017   Procedure: CYSTOSCOPY;  Surgeon: Bobbye Charleston, MD;  Location: [facility];  Service: Gynecology;  Laterality: N/A;   EYE MUSCLE SURGERY     FRACTURE SURGERY  11-13-17   L wrist-open reduction internal fixation   GLAUCOMA SURGERY Left 05/12/14   HERNIA REPAIR  05-28-11   Hernia result of 2007 colon surg   incisional ab  05/28/11   JOINT REPLACEMENT     right hip replacement    LAPAROSCOPIC RIGHT HEMI COLECTOMY Right 04/30/2019   Procedure: LAPAROSCOPIC  RIGHT HEMI COLECTOMY;  Surgeon: Ileana Roup, MD;  Location: [facility];  Service: General;  Laterality: Right;   LUMBAR FUSION     L4-L5   PARTIAL KNEE ARTHROPLASTY Right    PARTIAL KNEE ARTHROPLASTY Left    perforated deverticular abscess/laporotomy and colostomy     depression after this   POLYPECTOMY N/A 08/29/2017   Procedure: POLYPECTOMY;  Surgeon: Jerene Bears, MD;  Location: [facility];  Service: Gastroenterology;  Laterality: N/A;   POLYPECTOMY  11/13/2018   Procedure: POLYPECTOMY;  Surgeon: Jerene Bears, MD;  Location: [facility];  Service: Gastroenterology;;   POLYPECTOMY     RECTOCELE REPAIR   01/13/2020   SPINE SURGERY  02-16-08   Fusion L4L5   stribismus eye surgery Right    take down of colostomy     tonsillectomly     TONSILLECTOMY     TOTAL HIP ARTHROPLASTY Right    TOTAL KNEE REVISION Left 11/24/2015   Procedure: Conversion from left lateral unicompartmental knee arthroplasty to left total knee arthroplasty;  Surgeon: Mcarthur Rossetti, MD;  Location: [facility];  Service: Orthopedics;  Laterality: Left;   TUBAL LIGATION  1976   WRIST SURGERY Right 03/27/2020    FAMILY HISTORY Family History  Problem Relation Age of Onset   Alzheimer's disease Father    Heart disease Father        mitral valve replaced. unknown reason   Breast cancer Mother        64   Colon polyps Mother    Diabetes Mother    Heart disease Mother 23       CHF, MI 67   Heart attack Mother        x 2   Arthritis Mother    Cancer Mother    Hearing loss Mother    Hypertension Mother    Prostate cancer Brother    Cancer Brother    Hearing loss Brother    Hypertension Brother    Asthma Paternal Uncle    Hypertension Sister    Colon cancer Neg Hx    Esophageal cancer Neg Hx    Rectal cancer Neg Hx    Stomach cancer Neg Hx     SOCIAL HISTORY Social History   Tobacco Use   Smoking status: Never   Smokeless tobacco: Never  Vaping Use   Vaping Use: Never used  Substance Use Topics   Alcohol use: Yes    Alcohol/week: 7.0 standard drinks of alcohol    Types: 7 Glasses of wine per week  Comment: 1 glass wine with dinner most evenings   Drug use: No         OPHTHALMIC EXAM:  Base Eye Exam     Visual Acuity (ETDRS)       Right Left   Dist [place] 20/50 -2 20/60 -1   Dist ph [place] 20/25 20/40 -1  Pt had forgotten her glasses at home. Pt stated she sees a letter behind a letter while reading the eye chart.         Tonometry (Tonopen, 1:42 PM)       Right Left   Pressure 16 15         Pupils       Pupils APD   Right PERRL None   Left PERRL None         Visual  Fields       Left Right    Full Full         Extraocular Movement       Right Left    Full, Ortho Full, Ortho         Neuro/Psych     Oriented x3: Yes   Mood/Affect: Normal         Dilation     Both eyes: 1.0% Mydriacyl, 2.5% Phenylephrine @ 1:42 PM           Slit Lamp and Fundus Exam     External Exam       Right Left   External Normal Normal         Slit Lamp Exam       Right Left   Lids/Lashes Normal Normal   Conjunctiva/Sclera White and quiet 1+ Injection, no limbal flush, Trace Chemosis   Cornea Clear Clear   Anterior Chamber Deep and quiet Deep and quiet, no cells and flare   Iris Round and reactive Round and reactive   Lens Posterior chamber intraocular lens Posterior chamber intraocular lens   Anterior Vitreous Normal Normal         Fundus Exam       Right Left   Posterior Vitreous Posterior vitreous detachment Posterior vitreous detachment   Disc Peripapillary atrophy Peripapillary atrophy   C/D Ratio 0.05 0.05   Macula Hard drusen, no exudates, no hemorrhage, Geographic atrophy Hard drusen, no exudates, no hemorrhage, Geographic atrophy   Vessels Normal Normal   Periphery Normal Normal            IMAGING AND PROCEDURES  Imaging and Procedures for 05/16/22  Color Fundus Photography Optos - OU - Both Eyes       Right Eye Progression has been stable. Disc findings include normal observations. Macula : drusen, geographic atrophy. Vessels : normal observations. Periphery : normal observations.   Left Eye Progression has been stable. Disc findings include normal observations. Macula : normal observations, drusen, geographic atrophy. Periphery : normal observations.   Notes OU, myopic macular changes, no retinal holes clear media      OCT, Retina - OU - Both Eyes       Right Eye Quality was good. Scan locations included subfoveal. Central Foveal Thickness: 317. Progression has been stable. Findings include abnormal  foveal contour.   Left Eye Quality was good. Scan locations included subfoveal. Central Foveal Thickness: 276. Progression has been stable. Findings include abnormal foveal contour, epiretinal membrane.   Notes Stable over time, no active disease, no active Leakage is currently in either eye on BiPAP therapy will continue to observe  ASSESSMENT/PLAN:  Retinal telangiectasia of left eye OS stable as compared OCT 2019./Intraretinal fluid appearance.  Retinal telangiectasia of right eye OD with irregular contour of macula overall stable  Left epiretinal membrane Minor OS temporally observe     ICD-10-CM   1. 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 Retinal telangiectasia, left eye  H35.072 OCT, Retina - OU - Both Eyes    3. Retinal telangiectasia of left eye  H35.072     4. Left epiretinal membrane  H35.372       1.  OU doing well.  No acuity.  No progression of MAC-TEL  2.  OS with epiretinal membrane in fact schisis temporally as well as inferiorly over time no change no impact on fovea we will continue observe  3.  Ophthalmic Meds Ordered this visit:  No orders of the defined types were placed in this encounter.      Return in about 1 year (around 05/17/2023) for DILATE OU, COLOR FP, OCT.  There are no Patient Instructions on file for this visit.   Explained the diagnoses, plan, and follow up with the patient and they expressed understanding.  Patient expressed understanding of the importance of proper follow up care.   Clent Demark Marnae Madani M.D. Diseases & Surgery of the Retina and Vitreous [facility] 05/16/22     Abbreviations: M myopia (nearsighted); A astigmatism; H hyperopia (farsighted); P presbyopia; Mrx spectacle prescription;  CTL contact lenses; OD right eye; OS left eye; OU both eyes  XT exotropia; ET esotropia; PEK punctate  epithelial keratitis; PEE punctate epithelial erosions; DES dry eye syndrome; MGD meibomian gland dysfunction; ATs artificial tears; PFAT's preservative free artificial tears; [place] nuclear sclerotic cataract; PSC posterior subcapsular cataract; ERM epi-retinal membrane; PVD posterior vitreous detachment; RD retinal detachment; DM diabetes mellitus; DR diabetic retinopathy; NPDR non-proliferative diabetic retinopathy; PDR proliferative diabetic retinopathy; CSME clinically significant macular edema; DME diabetic macular edema; dbh dot blot hemorrhages; CWS cotton wool spot; POAG primary open angle glaucoma; C/D cup-to-disc ratio; HVF humphrey visual field; GVF goldmann visual field; OCT optical coherence tomography; IOP intraocular pressure; BRVO Branch retinal vein occlusion; CRVO central retinal vein occlusion; CRAO central retinal artery occlusion; BRAO branch retinal artery occlusion; RT retinal tear; SB scleral buckle; PPV pars plana vitrectomy; VH Vitreous hemorrhage; PRP panretinal laser photocoagulation; IVK intravitreal kenalog; VMT vitreomacular traction; MH Macular hole;  NVD neovascularization of the disc; NVE neovascularization elsewhere; AREDS age related eye disease study; ARMD age related macular degeneration; POAG primary open angle glaucoma; EBMD epithelial/anterior basement membrane dystrophy; ACIOL anterior chamber intraocular lens; IOL intraocular lens; PCIOL posterior chamber intraocular lens; Phaco/IOL phacoemulsification with intraocular lens placement; [place] photorefractive keratectomy; LASIK laser assisted in situ keratomileusis; HTN hypertension; DM diabetes mellitus; COPD chronic obstructive pulmonary disease

## 2022-05-16 NOTE — Assessment & Plan Note (Signed)
Minor OS temporally observe

## 2022-05-27 ENCOUNTER — Encounter (INDEPENDENT_AMBULATORY_CARE_PROVIDER_SITE_OTHER): Payer: Medicare Other | Admitting: Ophthalmology

## 2022-08-13 ENCOUNTER — Ambulatory Visit: Payer: Medicare Other | Admitting: Cardiovascular Disease

## 2022-10-11 ENCOUNTER — Ambulatory Visit: Payer: Medicare Other | Attending: Cardiovascular Disease | Admitting: Cardiovascular Disease

## 2022-10-11 ENCOUNTER — Encounter: Payer: Self-pay | Admitting: Cardiovascular Disease

## 2022-10-11 VITALS — BP 140/88 | HR 54 | Ht 61.0 in | Wt 143.8 lb

## 2022-10-11 DIAGNOSIS — I272 Pulmonary hypertension, unspecified: Secondary | ICD-10-CM | POA: Insufficient documentation

## 2022-10-11 DIAGNOSIS — G4733 Obstructive sleep apnea (adult) (pediatric): Secondary | ICD-10-CM | POA: Insufficient documentation

## 2022-10-11 DIAGNOSIS — I340 Nonrheumatic mitral (valve) insufficiency: Secondary | ICD-10-CM | POA: Insufficient documentation

## 2022-10-11 NOTE — Assessment & Plan Note (Signed)
History of severe obstructive sleep apnea on BiPAP which he benefits from.

## 2022-10-11 NOTE — Progress Notes (Signed)
10/11/2022 Tanya Leon   1941/01/07  [id number]  Primary Physician [name], MD Primary Cardiologist: [name] MD Tanya Leon, [place]  HPI:  Tanya Leon is a 82 y.o.  thin-appearing married Caucasian female mother of 2, grandmother, 4 grandchildren was accompanied who is husband Tanya Leon is a patient of mine as well.  I last saw her in the office 08/14/2021. She was she has no cardiac risk factors other than her mother died of a myocardial infarction at age 27. She has never had a heart attack or stroke. She denies chest pain or shortness of breath. She has noticed palpitations over the last month with some occasional dizziness. EKG performed by her PCP revealed PVCs as did her EKG today. She was drinking 2 cups of coffee a day which she has changed to decaf. Thyroid function tests were normal.      Because of palpitations she did have a monitor which failed to show PVCs.  She says that her palpitations have resolved since I last saw her.  She had a 2D echo performed 07/27/2019 showed normal LV size and function, mild bileaflet prolapse with mild to moderate MR, and diastolic dysfunction with a RV systolic blood pressure wound which was mildly elevated at 35 mmHg.  She does exercise, walks up 3 flights of stairs at [place] which is where she and her husband live does complain of some mild shortness of breath with with moderate exertion.    She apparently injured her ankle and no longer is playing pickle ball.   Since I saw her a year ago she has been diagnosed with severe obstructive sleep apnea and has been treated with BiPAP.  She feels clinically improved.  A  2D echo performed 08/02/2020 revealed normal LV systolic function, grade 1 diastolic dysfunction with only mild MR and posterior leaflet prolapse.  Her RV systolic pressure was 37 mmHg.     Since I saw her a year ago she has continued to do well.  She is very active and is completely asymptomatic denying chest  pain or shortness of breath.  She did have a 2D echo performed/6/23 revealing normal LV size and function with severe mitral regurgitation and posterior leaflet prolapse with mild to moderate left atrial dilatation and stable mild pulmonary hypertension with an RV systolic pressure of 35 mmHg.    Current Meds  Medication Sig   amoxicillin (AMOXIL) 500 MG capsule    aspirin-acetaminophen-caffeine (EXCEDRIN MIGRAINE) 250-250-65 MG tablet Take 2 tablets by mouth every 6 (six) hours as needed for headache.    Azelaic Acid 15 % gel Apply 1 application topically 2 (two) times daily. After skin is thoroughly washed and patted dry, gently but thoroughly massage a thin film of azelaic acid cream into the affected area twice daily, in the morning and evening.   Biotin 5000 MCG CAPS Take by mouth.   Calcium-Magnesium 500-250 MG TABS Take 1 tablet by mouth daily.   carboxymethylcellulose (REFRESH PLUS) 0.5 % SOLN Place 1 drop into both eyes 3 (three) times daily as needed (Dry eyes).   Cholecalciferol (VITAMIN D3) 50 MCG (2000 UT) TABS Take 2,000 Units by mouth every evening.   cycloSPORINE (RESTASIS) 0.05 % ophthalmic emulsion Place 1 drop into both eyes 2 (two) times daily.    DULoxetine HCl 40 MG CPEP Take 1 capsule by mouth daily.   estradiol (VIVELLE-DOT) 0.1 MG/24HR patch Place 1 patch onto the skin 2 (two) times a  week.    EVENING PRIMROSE OIL PO Take 1,300 mg by mouth 2 (two) times daily.   fluocinolone (SYNALAR) 0.01 % external solution Apply topically.   fluocinonide (LIDEX) 0.05 % external solution Apply 1 application  topically 2 (two) times a week. Applied to scalp after shampooing   fluticasone (FLONASE) 50 MCG/ACT nasal spray SPRAY 2 SPRAYS INTO EACH NOSTRIL EVERY DAY   Lutein 20 MG TABS Take 20 mg by mouth 2 (two) times daily.    Multiple Vitamins-Minerals (PRESERVISION AREDS 2 PO) Take 1 tablet by mouth 2 (two) times daily.   olopatadine (PATANOL) 0.1 % ophthalmic solution INSTILL 1 DROP  INTO BOTH EYES TWICE A DAY   omega-3 acid ethyl esters (LOVAZA) 1 G capsule Take 1,000 mg by mouth 2 (two) times daily.    polyethylene glycol powder (GLYCOLAX/MIRALAX) powder Take 17 g by mouth daily.    pregabalin (LYRICA) 25 MG capsule Take 1 capsule (25 mg total) by mouth 2 (two) times daily. TAKE 1 CAPSULE IN THE MORNING AND TAKE 1 CAPSULE IN THE EVENING (Patient taking differently: Take 25 mg by mouth 2 (two) times daily. TAKE 1CAPSULE IN THE MORNING AND TAKE 2  CAPSULE IN THE EVENING)   tiZANidine (ZANAFLEX) 2 MG tablet TAKE 1 TABLET (2 MG TOTAL) BY MOUTH AT BEDTIME AS NEEDED.   Current Facility-Administered Medications for the 10/11/22 encounter (Office Visit) with [name], MD  Medication   0.9 %  sodium chloride infusion     Allergies  Allergen Reactions   Crab [Shellfish Allergy] Swelling    Soft shelled crab   Doxycycline Rash    Social History   Socioeconomic History   Marital status: Married    Spouse name: Not on file   Number of children: 2   Years of education: Not on file   Highest education level: Bachelor's degree (e.g., BA, AB, BS)  Occupational History   Occupation: cpa/retired    Fish farm manager: Retired  Tobacco Use   Smoking status: Never   Smokeless tobacco: Never  Vaping Use   Vaping Use: Never used  Substance and Sexual Activity   Alcohol use: Yes    Alcohol/week: 7.0 standard drinks of alcohol    Types: 7 Glasses of wine per week    Comment: 1 glass wine with dinner most evenings   Drug use: No   Sexual activity: Yes    Birth control/protection: Post-menopausal  Other Topics Concern   Not on file  Social History Narrative   Married 73 years in 2015. 2 kids. 4 grandkids.       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 Previously [employer].    Social Determinants of Health   Financial Resource Strain: Not on file  Food Insecurity: Not on file  Transportation Needs: Not on file  Physical  Activity: Not on file  Stress: Not on file  Social Connections: Not on file  Intimate Partner Violence: Not on file     Review of Systems: General: negative for chills, fever, night sweats or weight changes.  Cardiovascular: negative for chest pain, dyspnea on exertion, edema, orthopnea, palpitations, paroxysmal nocturnal dyspnea or shortness of breath Dermatological: negative for rash Respiratory: negative for cough or wheezing Urologic: negative for hematuria Abdominal: negative for nausea, vomiting, diarrhea, bright red blood per rectum, melena, or hematemesis Neurologic: negative for visual changes, syncope, or dizziness All other systems reviewed and are otherwise negative except as noted above.  Blood pressure (!) 140/88, pulse (!) 54, height '5\' 1"'$  (1.549 m), weight 143 lb 12.8 oz (65.2 kg), SpO2 93 %.  General appearance: alert and no distress Neck: no adenopathy, no carotid bruit, no JVD, supple, symmetrical, trachea midline, and thyroid not enlarged, symmetric, no tenderness/mass/nodules Lungs: clear to auscultation bilaterally Heart: regular rate and rhythm, S1, S2 normal, no murmur, click, rub or gallop Extremities: extremities normal, atraumatic, no cyanosis or edema Pulses: 2+ and symmetric Skin: Skin color, texture, turgor normal. No rashes or lesions Neurologic: Grossly normal  EKG sinus bradycardia 54 with left anterior fascicular block.  Personally reviewed this EKG.  ASSESSMENT AND PLAN:   Pulmonary hypertension, unspecified ([place]) History of mild pulmonary hypertension with RV systolic pressure of 35 by 2D echo/6/23, mildly decreased compared to prior echo.  Moderate mitral regurgitation History of moderate to severe MR by 2D echo performed 01/03/2022.  LV size and function were normal.  She does not complain of increasing dyspnea.  She walks up 3 flights and does aerobics without limitation at the current time.  Will continue to follow her 2D echo on an annual  basis.  Obstructive sleep apnea History of severe obstructive sleep apnea on BiPAP which he benefits from.     [name] MD FACP,FACC,FAHA, [facility] 10/11/2022 10:36 AM

## 2022-10-11 NOTE — Assessment & Plan Note (Signed)
History of mild pulmonary hypertension with RV systolic pressure of 35 by 2D echo/6/23, mildly decreased compared to prior echo.

## 2022-10-11 NOTE — Assessment & Plan Note (Signed)
History of moderate to severe MR by 2D echo performed 01/03/2022.  LV size and function were normal.  She does not complain of increasing dyspnea.  She walks up 3 flights and does aerobics without limitation at the current time.  Will continue to follow her 2D echo on an annual basis.

## 2022-10-11 NOTE — Patient Instructions (Addendum)
Medication Instructions:  No Changes In Medications at this time.  *If you need a refill on your cardiac medications before your next appointment, please call your pharmacy*  Lab Work: None Ordered At This Time.  If you have labs (blood work) drawn today and your tests are completely normal, you will receive your results only by: [place] (if you have MyChart) OR A paper copy in the mail If you have any lab test that is abnormal or we need to change your treatment, we will call you to review the results.  Testing/Procedures: Your physician has requested that you have an echocardiogram IN [facility]. Echocardiography is a painless test that uses sound waves to create images of your heart. It provides your doctor with information about the size and shape of your heart and how well your heart's chambers and valves are working. You may receive an ultrasound enhancing agent through an IV if needed to better visualize your heart during the echo.This procedure takes approximately one hour. There are no restrictions for this procedure. This will take place at the [address].   Follow-Up: At [facility], you and your health needs are our priority.  As part of our continuing mission to provide you with exceptional heart care, we have created designated Provider Care Teams.  These Care Teams include your primary Cardiologist (physician) and Advanced Practice Providers (APPs -  Physician Assistants and Nurse Practitioners) who all work together to provide you with the care you need, when you need it.  Your next appointment:   6 month(s)  Provider:   Quay Burow, MD

## 2022-10-11 NOTE — Progress Notes (Signed)
[email or web address]

## 2023-01-10 ENCOUNTER — Ambulatory Visit (HOSPITAL_COMMUNITY): Payer: Medicare Other | Attending: Cardiovascular Disease

## 2023-01-10 DIAGNOSIS — I340 Nonrheumatic mitral (valve) insufficiency: Secondary | ICD-10-CM | POA: Diagnosis present

## 2023-01-10 LAB — ECHOCARDIOGRAM COMPLETE
Area-P 1/2: 1.85 cm2
MV M vel: 4.93 m/s
MV Peak grad: 97 mmHg
Radius: 0.6 cm
S' Lateral: 2.3 cm

## 2023-01-20 ENCOUNTER — Telehealth: Payer: Self-pay | Admitting: Cardiovascular Disease

## 2023-01-20 DIAGNOSIS — I340 Nonrheumatic mitral (valve) insufficiency: Secondary | ICD-10-CM

## 2023-01-20 NOTE — Telephone Encounter (Addendum)
Called pt back with results of her echocardiogram and Dr. Hazle Coca recommendations. She is agreeable with placing a referral to our structural heart team. Referral placed. All questions answered. While on the phone I was also able to schedule pt's 6 month follow up with Dr. Allyson Sabal.

## 2023-01-20 NOTE — Addendum Note (Signed)
Addended by: Bernita Buffy on: 01/20/2023 04:17 PM   Modules accepted: Orders

## 2023-01-20 NOTE — Telephone Encounter (Signed)
Pt called in and would like to know if Nurse could give her a call with the Echo results   Best number [phone]

## 2023-01-20 NOTE — Telephone Encounter (Signed)
Pt will be home home today only 2pm and after

## 2023-01-27 ENCOUNTER — Encounter: Payer: Self-pay | Admitting: Cardiovascular Disease

## 2023-01-27 ENCOUNTER — Ambulatory Visit: Payer: Medicare Other | Attending: Cardiovascular Disease | Admitting: Cardiovascular Disease

## 2023-01-27 VITALS — BP 110/66 | HR 57 | Ht 61.0 in | Wt 144.2 lb

## 2023-01-27 DIAGNOSIS — I340 Nonrheumatic mitral (valve) insufficiency: Secondary | ICD-10-CM | POA: Diagnosis present

## 2023-01-27 NOTE — Progress Notes (Signed)
Cardiology Office Note:    Date:  01/27/2023   ID:  Tanya Leon, DOB 08-17-1941, MRN [id number]  PCP:  Nadara Eaton, MD   [facility] Providers Cardiologist:  Nanetta Batty, MD    Referring MD: Nadara Eaton, MD   Chief Complaint  Patient presents with   Shortness of Breath    History of Present Illness:    Tanya Leon is a 82 y.o. female referred by Dr. Allyson Sabal for evaluation of mitral regurgitation.  The patient is here with her husband today.  They live at [facility].  She was initially seen by Dr. Gery Pray for evaluation of heart palpitations.  This resolved with conservative measures and reduction in her caffeine intake.  A monitor had demonstrated PVCs.  At the time of her initial echo assessment in 2020, she was noted to have mild bileaflet mitral valve prolapse with mild to moderate mitral regurgitation.  When she was seen in January 2024, she was felt to be asymptomatic.  Her echocardiogram from 2023 demonstrated severe mitral regurgitation with posterior leaflet prolapse.  A recent echocardiogram showed moderate to severe mitral regurgitation with further increase in her estimated RV systolic pressure.  Because of these changes, she is referred for further evaluation of mitral regurgitation.  The patient complains of exertional dyspnea.  She is short of breath with any moderate level activity and states this is new from last year.  She denies orthopnea, PND, or chest pain.  She is short of breath with a flight of stairs or with walking longer than a city block.  She has no chest pain or pressure.  She denies lightheadedness, leg edema, or syncope.  She has had some functional limitation from leg weakness.  She has had previous bilateral knee replacement as well as hip replacement.  Interestingly, her father had mitral regurgitation secondary to myxomatous mitral valve disease with mitral valve prolapse.  She brings in operative records from his surgery in [place]  [place] in 1975.  Past Medical History:  Diagnosis Date   Allergy 1973   Seasonal   Arthritis    Blood transfusion without reported diagnosis 08/15/2008   Following hip replacement   Cancer (HCC)    hx of skin cancer    Cataract 1998   Cataract surgery both eyes   Colon polyps    adenomatous   Complication of anesthesia    " pt stated they gave me too much - kept having to be reminded to breathe    Constipation    past hx- no longer an issue    Depression    hx of    Diverticulosis    DIVERTICULOSIS, COLON 01/13/2009   Glaucoma 05-12-14   Headache    history of migraines   Heart murmur    benign, dagnosed around age 1,prophylactic antibiotic   Low back pain    Lumbar spondylolysis    Osteopenia    Pneumonia    hx of as a baby    Pulmonary hypertension (HCC)    monitored by cards Dr Allyson Sabal annually via echo    PVC's (premature ventricular contractions)    i have them periodically , Dr Allyson Sabal had me cut back on caffeine and that helped    Rectocele    Sleep apnea    wears bi-pap, no oxygen    Tubular adenoma of colon    Wears hearing aid in both ears     Past Surgical History:  Procedure Laterality Date  ABDOMINAL HERNIA REPAIR Right    incisional   ABDOMINAL HYSTERECTOMY     with ovaries   BACK SURGERY  2009   L4-L5 with rods   BIOPSY  11/13/2018   Procedure: BIOPSY;  Surgeon: Beverley Fiedler, MD;  Location: [facility];  Service: Gastroenterology;;   BLADDER SUSPENSION N/A 04/09/2017   Procedure: TRANSVAGINAL TAPE (TVT) PROCEDURE;  Surgeon: Carrington Clamp, MD;  Location: [facility];  Service: Gynecology;  Laterality: N/A;   CATARACT EXTRACTION, BILATERAL     cataract left lens replacement     2016   COLON SURGERY  2007   Rpr abscessed ruptured diverticuli   COLONOSCOPY     COLONOSCOPY N/A 08/29/2017   Procedure: COLONOSCOPY;  Surgeon: Beverley Fiedler, MD;  Location: [facility];  Service: Gastroenterology;  Laterality: N/A;   COLONOSCOPY WITH PROPOFOL N/A  11/13/2018   Procedure: COLONOSCOPY WITH PROPOFOL with Methylane Blue Injection;  Surgeon: Beverley Fiedler, MD;  Location: [facility];  Service: Gastroenterology;  Laterality: N/A;   CYSTOCELE REPAIR N/A 04/09/2017   Procedure: ANTERIOR REPAIR (CYSTOCELE);  Surgeon: Carrington Clamp, MD;  Location: [facility];  Service: Gynecology;  Laterality: N/A;   CYSTOSCOPY N/A 04/09/2017   Procedure: CYSTOSCOPY;  Surgeon: Carrington Clamp, MD;  Location: [facility];  Service: Gynecology;  Laterality: N/A;   EYE MUSCLE SURGERY     FRACTURE SURGERY  11-13-17   L wrist-open reduction internal fixation   GLAUCOMA SURGERY Left 05/12/14   HERNIA REPAIR  05-28-11   Hernia result of 2007 colon surg   incisional ab  05/28/11   JOINT REPLACEMENT     right hip replacement    LAPAROSCOPIC RIGHT HEMI COLECTOMY Right 04/30/2019   Procedure: LAPAROSCOPIC  RIGHT HEMI COLECTOMY;  Surgeon: Andria Meuse, MD;  Location: [facility];  Service: General;  Laterality: Right;   LUMBAR FUSION     L4-L5   PARTIAL KNEE ARTHROPLASTY Right    PARTIAL KNEE ARTHROPLASTY Left    perforated deverticular abscess/laporotomy and colostomy     depression after this   POLYPECTOMY N/A 08/29/2017   Procedure: POLYPECTOMY;  Surgeon: Beverley Fiedler, MD;  Location: [facility];  Service: Gastroenterology;  Laterality: N/A;   POLYPECTOMY  11/13/2018   Procedure: POLYPECTOMY;  Surgeon: Beverley Fiedler, MD;  Location: [facility];  Service: Gastroenterology;;   POLYPECTOMY     RECTOCELE REPAIR  01/13/2020   SPINE SURGERY  02-16-08   Fusion L4L5   stribismus eye surgery Right    take down of colostomy     tonsillectomly     TONSILLECTOMY     TOTAL HIP ARTHROPLASTY Right    TOTAL KNEE REVISION Left 11/24/2015   Procedure: Conversion from left lateral unicompartmental knee arthroplasty to left total knee arthroplasty;  Surgeon: Kathryne Hitch, MD;  Location: [facility];  Service: Orthopedics;  Laterality: Left;   TUBAL LIGATION  1976   WRIST  SURGERY Right 03/27/2020    Current Medications: Current Meds  Medication Sig   amoxicillin (AMOXIL) 500 MG capsule    ascorbic acid (VITAMIN C) 1000 MG tablet Take 1,000 mg by mouth daily.   aspirin-acetaminophen-caffeine (EXCEDRIN MIGRAINE) 250-250-65 MG tablet Take 2 tablets by mouth every 6 (six) hours as needed for headache.    Azelaic Acid 15 % gel Apply 1 application topically 2 (two) times daily. After skin is thoroughly washed and patted dry, gently but thoroughly massage a thin film of azelaic acid cream into the affected area twice daily, in the morning  and evening.   betamethasone, augmented, (DIPROLENE) 0.05 % lotion as needed.   Biotin 5000 MCG CAPS Take by mouth.   Calcium Carb-Cholecalciferol 500-10 MG-MCG TABS Take by mouth in the morning and at bedtime.   Calcium-Magnesium 500-250 MG TABS Take 1 tablet by mouth daily.   carboxymethylcellulose (REFRESH PLUS) 0.5 % SOLN Place 1 drop into both eyes 3 (three) times daily as needed (Dry eyes).   Cholecalciferol (VITAMIN D3) 50 MCG (2000 UT) TABS Take 2,000 Units by mouth every evening.   clobetasol (OLUX) 0.05 % topical foam 1 application Externally Once to twice a week for 30 days   cycloSPORINE (RESTASIS) 0.05 % ophthalmic emulsion Place 1 drop into both eyes 2 (two) times daily.    DULoxetine HCl 40 MG CPEP Take 1 capsule by mouth daily.   estradiol (VIVELLE-DOT) 0.1 MG/24HR patch Place 1 patch onto the skin 2 (two) times a week.    EVENING PRIMROSE OIL PO Take 1,300 mg by mouth 2 (two) times daily.   fluocinolone (SYNALAR) 0.01 % external solution Apply topically.   fluocinonide (LIDEX) 0.05 % external solution Apply 1 application  topically 2 (two) times a week. Applied to scalp after shampooing   fluticasone (FLONASE) 50 MCG/ACT nasal spray SPRAY 2 SPRAYS INTO EACH NOSTRIL EVERY DAY   Lutein 20 MG TABS Take 20 mg by mouth 2 (two) times daily.    Multiple Vitamins-Minerals (PRESERVISION AREDS 2 PO) Take 1 tablet by mouth 2  (two) times daily.   olopatadine (PATANOL) 0.1 % ophthalmic solution INSTILL 1 DROP INTO BOTH EYES TWICE A DAY   omega-3 acid ethyl esters (LOVAZA) 1 G capsule Take 1,000 mg by mouth 2 (two) times daily.    polyethylene glycol powder (GLYCOLAX/MIRALAX) powder Take 17 g by mouth daily.    pregabalin (LYRICA) 25 MG capsule Take 1 capsule (25 mg total) by mouth 2 (two) times daily. TAKE 1 CAPSULE IN THE MORNING AND TAKE 1 CAPSULE IN THE EVENING (Patient taking differently: Take 25 mg by mouth 2 (two) times daily. TAKE 1CAPSULE IN THE MORNING AND TAKE 2  CAPSULE IN THE EVENING)   tiZANidine (ZANAFLEX) 2 MG tablet TAKE 1 TABLET (2 MG TOTAL) BY MOUTH AT BEDTIME AS NEEDED.   Current Facility-Administered Medications for the 01/27/23 encounter (Office Visit) with Tonny Bollman, MD  Medication   0.9 %  sodium chloride infusion     Allergies:   Parke Simmers allergy] and Doxycycline   Social History   Socioeconomic History   Marital status: Married    Spouse name: Not on file   Number of children: 2   Years of education: Not on file   Highest education level: Bachelor's degree (e.g., BA, AB, BS)  Occupational History   Occupation: cpa/retired    Associate Professor: Retired  Tobacco Use   Smoking status: Never   Smokeless tobacco: Never  Vaping Use   Vaping Use: Never used  Substance and Sexual Activity   Alcohol use: Yes    Alcohol/week: 7.0 standard drinks of alcohol    Types: 7 Glasses of wine per week    Comment: 1 glass wine with dinner most evenings   Drug use: No   Sexual activity: Yes    Birth control/protection: Post-menopausal  Other Topics Concern   Not on file  Social History Narrative   Married 54 years in 2015. 2 kids. 4 grandkids.       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 Previously [employer].    Social Determinants of Health   Financial Resource Strain: Not on file  Food Insecurity: Not on file  Transportation Needs:  Not on file  Physical Activity: Not on file  Stress: Not on file  Social Connections: Not on file     Family History: The patient's family history includes Alzheimer's disease in her father; Arthritis in her mother; Asthma in her paternal uncle; Breast cancer in her mother; Cancer in her brother and mother; Colon polyps in her mother; Diabetes in her mother; Hearing loss in her brother and mother; Heart attack in her mother; Heart disease in her father; Heart disease (age of onset: 63) in her mother; Hypertension in her brother, mother, and sister; Prostate cancer in her brother. There is no history of Colon cancer, Esophageal cancer, Rectal cancer, or Stomach cancer.  ROS:   Please see the history of present illness.    All other systems reviewed and are negative.  EKGs/Labs/Other Studies Reviewed:    The following studies were reviewed today: Cardiac Studies & Procedures       ECHOCARDIOGRAM  ECHOCARDIOGRAM COMPLETE 01/10/2023  Narrative ECHOCARDIOGRAM REPORT    Patient Name:   Tanya Leon [facility] Date of Exam: 01/10/2023 Medical Rec #:  [id number]      Height:       61.0 in Accession #:    [id number]     Weight:       143.8 lb Date of Birth:  1941/02/26       BSA:          1.642 m Patient Age:    82 years       BP:           140/88 mmHg Patient Gender: F              HR:           63 bpm. Exam Location:  [address]  Procedure: 2D Echo, 3D Echo, Cardiac Doppler, Color Doppler and Strain Analysis  Indications:     I05.9 Mitral valve disorder  History:         Patient has prior history of Echocardiogram examinations, most recent 01/03/2022. Arrythmias:PVC; Risk Factors:Sleep Apnea and Family History of Coronary Artery Disease. Palpitations. Dizziness. Pulmonary hypertension.  Sonographer:     Cathie Beams RCS Referring Phys:  [id number] BERRY Diagnosing Phys: Armanda Magic MD  IMPRESSIONS   1. Left ventricular ejection fraction, by estimation, is 70 to 75%. Left  ventricular ejection fraction by 3D volume is 71 %. The left ventricle has hyperdynamic function. The left ventricle has no regional wall motion abnormalities. Left ventricular diastolic function could not be evaluated. The global longitudinal strain is normal despite suboptimal segment tracking. 2. Right ventricular systolic function is normal. The right ventricular size is normal. There is moderately elevated pulmonary artery systolic pressure. The estimated right ventricular systolic pressure is 47.6 mmHg. 3. Left atrial size was severely dilated. 4. 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 Moderate to severe mitral valve regurgitation. No evidence of mitral stenosis. 5. 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 6. The aortic valve is normal in structure. Aortic valve regurgitation is not visualized. No aortic stenosis is present. 7. The inferior vena cava is normal in size with greater than 50% respiratory variability, suggesting right atrial pressure of 3 mmHg. 8. Compared to prior study, PA pressures have increased. Consider referral to Structural Heart  Team.  FINDINGS Left Ventricle: Left ventricular ejection fraction, by estimation, is 70 to 75%. Left ventricular ejection fraction by 3D volume is 71 %. The left ventricle has hyperdynamic function. The left ventricle has no regional wall motion abnormalities. The global longitudinal strain is normal despite suboptimal segment tracking. The left ventricular internal cavity size was normal in size. There is no left ventricular hypertrophy. Left ventricular diastolic function could not be evaluated.  Right Ventricle: The right ventricular size is normal. No increase in right ventricular wall thickness. Right ventricular systolic function is normal. There is moderately elevated pulmonary artery systolic pressure. The tricuspid regurgitant velocity is 3.34 m/s, and with an  assumed right atrial pressure of 3 mmHg, the estimated right ventricular systolic pressure is 47.6 mmHg.  Left Atrium: Left atrial size was severely dilated.  Right Atrium: Right atrial size was normal in size.  Pericardium: There is no evidence of pericardial effusion.  Mitral Valve: The mitral valve is myxomatous. There is severe holosystolic prolapse of multiple scallops of the posterior leaflet of the mitral valve. Moderate to severe mitral valve regurgitation. No evidence of mitral valve stenosis.  Tricuspid Valve: The tricuspid valve is normal in structure. Tricuspid valve regurgitation is moderate . No evidence of tricuspid stenosis.  Aortic Valve: The aortic valve is normal in structure. Aortic valve regurgitation is not visualized. No aortic stenosis is present.  Pulmonic Valve: The pulmonic valve was normal in structure. Pulmonic valve regurgitation is trivial. No evidence of pulmonic stenosis.  Aorta: The aortic root is normal in size and structure.  Venous: The inferior vena cava is normal in size with greater than 50% respiratory variability, suggesting right atrial pressure of 3 mmHg.  IAS/Shunts: No atrial level shunt detected by color flow Doppler.   LEFT VENTRICLE PLAX 2D LVIDd:         3.40 cm LVIDs:         2.30 cm         2D LV PW:         1.60 cm         Longitudinal LV IVS:        1.00 cm         Strain LVOT diam:     2.10 cm         2D Strain GLS  -23.4 % LV SV:         78              (A2C): LV SV Index:   47              2D Strain GLS  -25.8 % LVOT Area:     3.46 cm        (A3C): 2D Strain GLS  -21.6 % (A4C): 2D Strain GLS  -23.6 % Avg:  3D Volume EF LV 3D EF:    Left ventricul ar ejection fraction by 3D volume is 71 %.  3D Volume EF: 3D EF:        71 % LV EDV:       126 ml LV ESV:       36 ml LV SV:        90 ml  RIGHT VENTRICLE RV Basal diam:  3.10 cm RV Mid diam:    2.00 cm TAPSE (M-mode): 1.6 cm RVSP:           52.6 mmHg  LEFT  ATRIUM             Index  RIGHT ATRIUM           Index LA diam:        4.50 cm 2.74 cm/m   RA Pressure: 8.00 mmHg LA Vol (A2C):   95.7 ml 58.29 ml/m  RA Area:     16.10 cm LA Vol (A4C):   87.2 ml 53.12 ml/m  RA Volume:   33.70 ml  20.53 ml/m LA Biplane Vol: 91.6 ml 55.80 ml/m AORTIC VALVE LVOT Vmax:   103.00 cm/s LVOT Vmean:  64.800 cm/s LVOT VTI:    0.225 m  AORTA Ao Root diam: 2.90 cm Ao Asc diam:  3.40 cm  MITRAL VALVE                  TRICUSPID VALVE MV Area (PHT): 1.85 cm       TR Peak grad:   44.6 mmHg MV Decel Time: 409 msec       TR Vmax:        334.00 cm/s MR Peak grad:    97.0 mmHg    Estimated RAP:  8.00 mmHg MR Mean grad:    61.5 mmHg    RVSP:           52.6 mmHg MR Vmax:         492.50 cm/s MR Vmean:        362.5 cm/s   SHUNTS MR PISA:         2.26 cm     Systemic VTI:  0.22 m MR PISA Eff ROA: 18 mm       Systemic Diam: 2.10 cm MR PISA Radius:  0.60 cm MV E velocity: 58.20 cm/s MV A velocity: 69.20 cm/s MV E/A ratio:  0.84  Armanda Magic MD Electronically signed by Armanda Magic MD Signature Date/Time: 01/10/2023/1:24:29 PM    Final (Updated)    MONITORS  CARDIAC EVENT MONITOR 05/01/2017            EKG:  EKG is ordered today.  The ekg ordered today demonstrates sinus bradycardia 57 bpm, right superior axis deviation, otherwise normal  Recent Labs: No results found for requested labs within last 365 days.  Recent Lipid Panel    Component Value Date/Time   CHOL 180 06/04/2016 0903   TRIG 47.0 06/04/2016 0903   HDL 90.00 06/04/2016 0903   CHOLHDL 2 06/04/2016 0903   VLDL 9.4 06/04/2016 0903   LDLCALC 80 06/04/2016 0903     Risk Assessment/Calculations:                Physical Exam:    VS:  BP 110/66   Pulse (!) 57   Ht 5\' 1"  (1.549 m)   Wt 144 lb 3.2 oz (65.4 kg)   SpO2 95%   BMI 27.25 kg/m     Wt Readings from Last 3 Encounters:  01/27/23 144 lb 3.2 oz (65.4 kg)  10/11/22 143 lb 12.8 oz (65.2 kg)  08/14/21 139 lb (63  kg)     GEN:  Well nourished, well developed in no acute distress HEENT: Normal NECK: No JVD; No carotid bruits LYMPHATICS: No lymphadenopathy CARDIAC: RRR, 2/6 mid-to-late systolic murmur only heard at the left mid-axillary line RESPIRATORY:  Clear to auscultation without rales, wheezing or rhonchi  ABDOMEN: Soft, non-tender, non-distended MUSCULOSKELETAL:  No edema; No deformity  SKIN: Warm and dry NEUROLOGIC:  Alert and oriented x 3 PSYCHIATRIC:  Normal affect   ASSESSMENT:    1. Severe mitral valve regurgitation    PLAN:  In order of problems listed above:  The patient appears to have severe, stage D, mitral regurgitation with myxomatous degeneration of the mitral valve and bileaflet prolapse with marked posterior leaflet prolapse.  Her symptoms are NYHA functional class II at present.  I personally reviewed her echo images which show normal LV and RV function with an LVEF of greater than 65%.  The estimated RV systolic pressure is increased at 48 mmHg.  There is severe myxomatous change with thickening and prolapse of the posterior mitral leaflet with moderate to severe eccentric mitral regurgitation.  There is a spherical structure seen on the posterior subvalvular apparatus likely related to myxomatous thickening.  I reviewed the natural history of mitral regurgitation with the patient and her husband today.  I think she needs further evaluation with a transesophageal echo to better assess the functional anatomy of her mitral valve.  I also think a right and left heart catheterization is indicated to assess the hemodynamics of her mitral regurgitation, right heart pressures, and evaluate coronary anatomy to aid in decision making regarding her treatment options.  Risks, indications, and alternatives to transesophageal echocardiography are reviewed today. I have reviewed the risks, indications, and alternatives to cardiac catheterization, possible angioplasty, and stenting with the  patient. Risks include but are not limited to bleeding, infection, vascular injury, stroke, myocardial infection, arrhythmia, kidney injury, radiation-related injury in the case of prolonged fluoroscopy use, emergency cardiac surgery, and death. The patient understands the risks of serious complication is 1-2 in 1000 with diagnostic cardiac cath and 1-2% or less with angioplasty/stenting.  Once her TEE and cardiac catheterization studies are completed, she will be referred for formal cardiac surgical consultation to mitral valve specialist and her case will be reviewed with our heart valve team.  We contrasted some of the pros and cons with minimally invasive mitral valve surgery versus transcatheter edge-to-edge repair of the mitral valve.  I am concerned that her anatomy may not be favorable for TEER.       Shared Decision Making/Informed Consent The risks [esophageal damage, perforation (1:10,000 risk), bleeding, pharyngeal hematoma as well as other potential complications associated with conscious sedation including aspiration, arrhythmia, respiratory failure and death], benefits (treatment guidance and diagnostic support) and alternatives of a transesophageal echocardiogram were discussed in detail with Tanya Leon and she is willing to proceed.     Medication Adjustments/Labs and Tests Ordered: Current medicines are reviewed at length with the patient today.  Concerns regarding medicines are outlined above.  Orders Placed This Encounter  Procedures   EKG 12-Lead   No orders of the defined types were placed in this encounter.   Patient Instructions  Medication Instructions:  Your physician recommends that you continue on your current medications as directed. Please refer to the Current Medication list given to you today.  *If you need a refill on your cardiac medications before your next appointment, please call your pharmacy*   Lab Work: CBC, BMET (within 7 days of procedure) If you  have labs (blood work) drawn today and your tests are completely normal, you will receive your results only by: MyChart Message (if you have MyChart) OR A paper copy in the mail If you have any lab test that is abnormal or we need to change your treatment, we will call you to review the results.  Testing/Procedures: Transesophageal Echocardiogram (you will be contacted to schedule) Your physician has requested that you have a TEE. During a TEE, sound waves are used to create images of  your heart. It provides your doctor with information about the size and shape of your heart and how well your heart's chambers and valves are working. In this test, a transducer is attached to the end of a flexible tube that's guided down your throat and into your esophagus (the tube leading from you mouth to your stomach) to get a more detailed image of your heart. You are not awake for the procedure. Please see the instruction sheet given to you today. For further information please visit [email or web address]  R & L Heart Catheterization (to be done same day as TEE) Your physician has requested that you have a cardiac catheterization. Cardiac catheterization is used to diagnose and/or treat various heart conditions. Doctors may recommend this procedure for a number of different reasons. The most common reason is to evaluate chest pain. Chest pain can be a symptom of coronary artery disease (CAD), and cardiac catheterization can show whether plaque is narrowing or blocking your heart's arteries. This procedure is also used to evaluate the valves, as well as measure the blood flow and oxygen levels in different parts of your heart. For further information please visit [email or web address] Please follow instruction sheet, as given.  Follow-Up: At [facility], you and your health needs are our priority.  As part of our continuing mission to provide you with exceptional heart care, we have created designated  Provider Care Teams.  These Care Teams include your primary Cardiologist (physician) and Advanced Practice Providers (APPs -  Physician Assistants and Nurse Practitioners) who all work together to provide you with the care you need, when you need it.  Your next appointment:   Structural team will follow-up  Provider:   Tonny Bollman, MD        Signed, Tonny Bollman, MD  01/27/2023 5:48 PM    [facility]

## 2023-01-27 NOTE — Patient Instructions (Signed)
Medication Instructions:  Your physician recommends that you continue on your current medications as directed. Please refer to the Current Medication list given to you today.  *If you need a refill on your cardiac medications before your next appointment, please call your pharmacy*   Lab Work: CBC, BMET (within 7 days of procedure) If you have labs (blood work) drawn today and your tests are completely normal, you will receive your results only by: MyChart Message (if you have MyChart) OR A paper copy in the mail If you have any lab test that is abnormal or we need to change your treatment, we will call you to review the results.  Testing/Procedures: Transesophageal Echocardiogram (you will be contacted to schedule) Your physician has requested that you have a TEE. During a TEE, sound waves are used to create images of your heart. It provides your doctor with information about the size and shape of your heart and how well your heart's chambers and valves are working. In this test, a transducer is attached to the end of a flexible tube that's guided down your throat and into your esophagus (the tube leading from you mouth to your stomach) to get a more detailed image of your heart. You are not awake for the procedure. Please see the instruction sheet given to you today. For further information please visit [email or web address]  R & L Heart Catheterization (to be done same day as TEE) Your physician has requested that you have a cardiac catheterization. Cardiac catheterization is used to diagnose and/or treat various heart conditions. Doctors may recommend this procedure for a number of different reasons. The most common reason is to evaluate chest pain. Chest pain can be a symptom of coronary artery disease (CAD), and cardiac catheterization can show whether plaque is narrowing or blocking your heart's arteries. This procedure is also used to evaluate the valves, as well as measure the blood flow  and oxygen levels in different parts of your heart. For further information please visit [email or web address] Please follow instruction sheet, as given.  Follow-Up: At [facility], you and your health needs are our priority.  As part of our continuing mission to provide you with exceptional heart care, we have created designated Provider Care Teams.  These Care Teams include your primary Cardiologist (physician) and Advanced Practice Providers (APPs -  Physician Assistants and Nurse Practitioners) who all work together to provide you with the care you need, when you need it.  Your next appointment:   Structural team will follow-up  Provider:   Tonny Bollman, MD

## 2023-01-27 NOTE — H&P (View-Only) (Signed)
Cardiology Office Note:    Date:  01/27/2023   ID:  Tanya Leon, DOB 03/19/1941, MRN [id number]  PCP:  Piazza, Lasharn Bufkin J, MD   [facility] Providers Cardiologist:  Jonathan Berry, MD    Referring MD: Piazza, Gumecindo Hopkin J, MD   Chief Complaint  Patient presents with   Shortness of Breath    History of Present Illness:    Tanya Leon is a 82 y.o. female referred by Dr. Berry for evaluation of mitral regurgitation.  The patient is here with her husband today.  They live at [place].  She was initially seen by Dr. Barry for evaluation of heart palpitations.  This resolved with conservative measures and reduction in her caffeine intake.  A monitor had demonstrated PVCs.  At the time of her initial echo assessment in 2020, she was noted to have mild bileaflet mitral valve prolapse with mild to moderate mitral regurgitation.  When she was seen in January 2024, she was felt to be asymptomatic.  Her echocardiogram from 2023 demonstrated severe mitral regurgitation with posterior leaflet prolapse.  A recent echocardiogram showed moderate to severe mitral regurgitation with further increase in her estimated RV systolic pressure.  Because of these changes, she is referred for further evaluation of mitral regurgitation.  The patient complains of exertional dyspnea.  She is short of breath with any moderate level activity and states this is new from last year.  She denies orthopnea, PND, or chest pain.  She is short of breath with a flight of stairs or with walking longer than a city block.  She has no chest pain or pressure.  She denies lightheadedness, leg edema, or syncope.  She has had some functional limitation from leg weakness.  She has had previous bilateral knee replacement as well as hip replacement.  Interestingly, her father had mitral regurgitation secondary to myxomatous mitral valve disease with mitral valve prolapse.  She brings in operative records from his surgery in [place] in 1975.  Past Medical History:  Diagnosis Date   Allergy 1973   Seasonal   Arthritis    Blood transfusion without reported diagnosis 08/15/2008   Following hip replacement   Cancer (HCC)    hx of skin cancer    Cataract 1998   Cataract surgery both eyes   Colon polyps    adenomatous   Complication of anesthesia    " pt stated they gave me too much - kept having to be reminded to breathe    Constipation    past hx- no longer an issue    Depression    hx of    Diverticulosis    DIVERTICULOSIS, COLON 01/13/2009   Glaucoma 05-12-14   Headache    history of migraines   Heart murmur    benign, dagnosed around age 49,prophylactic antibiotic   Low back pain    Lumbar spondylolysis    Osteopenia    Pneumonia    hx of as a baby    Pulmonary hypertension (HCC)    monitored by cards Dr Berry annually via echo    PVC's (premature ventricular contractions)    i have them periodically , Dr Berry had me cut back on caffeine and that helped    Rectocele    Sleep apnea    wears bi-pap, no oxygen    Tubular adenoma of colon    Wears hearing aid in both ears     Past Surgical History:  Procedure Laterality Date     ABDOMINAL HERNIA REPAIR Right    incisional   ABDOMINAL HYSTERECTOMY     with ovaries   BACK SURGERY  2009   L4-L5 with rods   BIOPSY  11/13/2018   Procedure: BIOPSY;  Surgeon: Pyrtle, Jay M, MD;  Location: [facility];  Service: Gastroenterology;;   BLADDER SUSPENSION N/A 04/09/2017   Procedure: TRANSVAGINAL TAPE (TVT) PROCEDURE;  Surgeon: Horvath, Michelle, MD;  Location: [facility];  Service: Gynecology;  Laterality: N/A;   CATARACT EXTRACTION, BILATERAL     cataract left lens replacement     2016   COLON SURGERY  2007   Rpr abscessed ruptured diverticuli   COLONOSCOPY     COLONOSCOPY N/A 08/29/2017   Procedure: COLONOSCOPY;  Surgeon: Pyrtle, Jay M, MD;  Location: [facility];  Service: Gastroenterology;  Laterality: N/A;   COLONOSCOPY WITH PROPOFOL N/A  11/13/2018   Procedure: COLONOSCOPY WITH PROPOFOL with Methylane Blue Injection;  Surgeon: Pyrtle, Jay M, MD;  Location: [facility];  Service: Gastroenterology;  Laterality: N/A;   CYSTOCELE REPAIR N/A 04/09/2017   Procedure: ANTERIOR REPAIR (CYSTOCELE);  Surgeon: Horvath, Michelle, MD;  Location: [facility];  Service: Gynecology;  Laterality: N/A;   CYSTOSCOPY N/A 04/09/2017   Procedure: CYSTOSCOPY;  Surgeon: Horvath, Michelle, MD;  Location: [facility];  Service: Gynecology;  Laterality: N/A;   EYE MUSCLE SURGERY     FRACTURE SURGERY  11-13-17   L wrist-open reduction internal fixation   GLAUCOMA SURGERY Left 05/12/14   HERNIA REPAIR  05-28-11   Hernia result of 2007 colon surg   incisional ab  05/28/11   JOINT REPLACEMENT     right hip replacement    LAPAROSCOPIC RIGHT HEMI COLECTOMY Right 04/30/2019   Procedure: LAPAROSCOPIC  RIGHT HEMI COLECTOMY;  Surgeon: White, Christopher M, MD;  Location: [facility];  Service: General;  Laterality: Right;   LUMBAR FUSION     L4-L5   PARTIAL KNEE ARTHROPLASTY Right    PARTIAL KNEE ARTHROPLASTY Left    perforated deverticular abscess/laporotomy and colostomy     depression after this   POLYPECTOMY N/A 08/29/2017   Procedure: POLYPECTOMY;  Surgeon: Pyrtle, Jay M, MD;  [facility]: [facility];  Service: Gastroenterology;  Laterality: N/A;   POLYPECTOMY  11/13/2018   Procedure: POLYPECTOMY;  Surgeon: Pyrtle, Jay M, MD;  [facility]: [facility];  Service: Gastroenterology;;   POLYPECTOMY     RECTOCELE REPAIR  01/13/2020   SPINE SURGERY  02-16-08   Fusion L4L5   stribismus eye surgery Right    take down of colostomy     tonsillectomly     TONSILLECTOMY     TOTAL HIP ARTHROPLASTY Right    TOTAL KNEE REVISION Left 11/24/2015   Procedure: Conversion from left lateral unicompartmental knee arthroplasty to left total knee arthroplasty;  Surgeon: Christopher Y Blackman, MD;  Location: [facility];  Service: Orthopedics;  Laterality: Left;   TUBAL LIGATION  1976   WRIST  SURGERY Right 03/27/2020    Current Medications: Current Meds  Medication Sig   amoxicillin (AMOXIL) 500 MG capsule    ascorbic acid (VITAMIN C) 1000 MG tablet Take 1,000 mg by mouth daily.   aspirin-acetaminophen-caffeine (EXCEDRIN MIGRAINE) 250-250-65 MG tablet Take 2 tablets by mouth every 6 (six) hours as needed for headache.    Azelaic Acid 15 % gel Apply 1 application topically 2 (two) times daily. After skin is thoroughly washed and patted dry, gently but thoroughly massage a thin film of azelaic acid cream into the affected area twice daily, in the morning   and evening.   betamethasone, augmented, (DIPROLENE) 0.05 % lotion as needed.   Biotin 5000 MCG CAPS Take by mouth.   Calcium Carb-Cholecalciferol 500-10 MG-MCG TABS Take by mouth in the morning and at bedtime.   Calcium-Magnesium 500-250 MG TABS Take 1 tablet by mouth daily.   carboxymethylcellulose (REFRESH PLUS) 0.5 % SOLN Place 1 drop into both eyes 3 (three) times daily as needed (Dry eyes).   Cholecalciferol (VITAMIN D3) 50 MCG (2000 UT) TABS Take 2,000 Units by mouth every evening.   clobetasol (OLUX) 0.05 % topical foam 1 application Externally Once to twice a week for 30 days   cycloSPORINE (RESTASIS) 0.05 % ophthalmic emulsion Place 1 drop into both eyes 2 (two) times daily.    DULoxetine HCl 40 MG CPEP Take 1 capsule by mouth daily.   estradiol (VIVELLE-DOT) 0.1 MG/24HR patch Place 1 patch onto the skin 2 (two) times a week.    EVENING PRIMROSE OIL PO Take 1,300 mg by mouth 2 (two) times daily.   fluocinolone (SYNALAR) 0.01 % external solution Apply topically.   fluocinonide (LIDEX) 0.05 % external solution Apply 1 application  topically 2 (two) times a week. Applied to scalp after shampooing   fluticasone (FLONASE) 50 MCG/ACT nasal spray SPRAY 2 SPRAYS INTO EACH NOSTRIL EVERY DAY   Lutein 20 MG TABS Take 20 mg by mouth 2 (two) times daily.    Multiple Vitamins-Minerals (PRESERVISION AREDS 2 PO) Take 1 tablet by mouth 2  (two) times daily.   olopatadine (PATANOL) 0.1 % ophthalmic solution INSTILL 1 DROP INTO BOTH EYES TWICE A DAY   omega-3 acid ethyl esters (LOVAZA) 1 G capsule Take 1,000 mg by mouth 2 (two) times daily.    polyethylene glycol powder (GLYCOLAX/MIRALAX) powder Take 17 g by mouth daily.    pregabalin (LYRICA) 25 MG capsule Take 1 capsule (25 mg total) by mouth 2 (two) times daily. TAKE 1 CAPSULE IN THE MORNING AND TAKE 1 CAPSULE IN THE EVENING (Patient taking differently: Take 25 mg by mouth 2 (two) times daily. TAKE 1CAPSULE IN THE MORNING AND TAKE 2  CAPSULE IN THE EVENING)   tiZANidine (ZANAFLEX) 2 MG tablet TAKE 1 TABLET (2 MG TOTAL) BY MOUTH AT BEDTIME AS NEEDED.   Current Facility-Administered Medications for the 01/27/23 encounter (Office Visit) with Shaasia Odle, MD  Medication   0.9 %  sodium chloride infusion     Allergies:   Crab [shellfish allergy] and Doxycycline   Social History   Socioeconomic History   Marital status: Married    Spouse name: Not on file   Number of children: 2   Years of education: Not on file   Highest education level: Bachelor's degree (e.g., BA, AB, BS)  Occupational History   Occupation: cpa/retired    Employer: Retired  Tobacco Use   Smoking status: Never   Smokeless tobacco: Never  Vaping Use   Vaping Use: Never used  Substance and Sexual Activity   Alcohol use: Yes    Alcohol/week: 7.0 standard drinks of alcohol    Types: 7 Glasses of wine per week    Comment: 1 glass wine with dinner most evenings   Drug use: No   Sexual activity: Yes    Birth control/protection: Post-menopausal  Other Topics Concern   Not on file  Social History Narrative   Married 54 years in 2015. 2 kids. 4 grandkids.       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 Previously [employer].    Social Determinants of Health   Financial Resource Strain: Not on file  Food Insecurity: Not on file  Transportation Needs:  Not on file  Physical Activity: Not on file  Stress: Not on file  Social Connections: Not on file     Family History: The patient's family history includes Alzheimer's disease in her father; Arthritis in her mother; Asthma in her paternal uncle; Breast cancer in her mother; Cancer in her brother and mother; Colon polyps in her mother; Diabetes in her mother; Hearing loss in her brother and mother; Heart attack in her mother; Heart disease in her father; Heart disease (age of onset: 93) in her mother; Hypertension in her brother, mother, and sister; Prostate cancer in her brother. There is no history of Colon cancer, Esophageal cancer, Rectal cancer, or Stomach cancer.  ROS:   Please see the history of present illness.    All other systems reviewed and are negative.  EKGs/Labs/Other Studies Reviewed:    The following studies were reviewed today: Cardiac Studies & Procedures       ECHOCARDIOGRAM  ECHOCARDIOGRAM COMPLETE 01/10/2023  Narrative ECHOCARDIOGRAM REPORT    Patient Name:   Aissatou K Wiebelhaus Date of Exam: 01/10/2023 Medical Rec #:  [id number]      Height:       61.0 in Accession #:    [id number]     Weight:       143.8 lb Date of Birth:  03/04/1941       BSA:          1.642 m Patient Age:    82 years       BP:           140/88 mmHg Patient Gender: F              HR:           63 bpm. Exam Location:  [address]  Procedure: 2D Echo, 3D Echo, Cardiac Doppler, Color Doppler and Strain Analysis  Indications:     I05.9 Mitral valve disorder  History:         Patient has prior history of Echocardiogram examinations, most recent 01/03/2022. Arrythmias:PVC; Risk Factors:Sleep Apnea and Family History of Coronary Artery Disease. Palpitations. Dizziness. Pulmonary hypertension.  Sonographer:     Angela Gregory RCS Referring Phys:  [id number] JONATHAN J BERRY Diagnosing Phys: Traci Turner MD  IMPRESSIONS   1. Left ventricular ejection fraction, by estimation, is 70 to 75%. Left  ventricular ejection fraction by 3D volume is 71 %. The left ventricle has hyperdynamic function. The left ventricle has no regional wall motion abnormalities. Left ventricular diastolic function could not be evaluated. The global longitudinal strain is normal despite suboptimal segment tracking. 2. Right ventricular systolic function is normal. The right ventricular size is normal. There is moderately elevated pulmonary artery systolic pressure. The estimated right ventricular systolic pressure is 47.6 mmHg. 3. Left atrial size was severely dilated. 4. 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 Moderate to severe mitral valve regurgitation. No evidence of mitral stenosis. 5. 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 6. The aortic valve is normal in structure. Aortic valve regurgitation is not visualized. No aortic stenosis is present. 7. The inferior vena cava is normal in size with greater than 50% respiratory variability, suggesting right atrial pressure of 3 mmHg. 8. Compared to prior study, PA pressures have increased. Consider referral to Structural Heart   Team.  FINDINGS Left Ventricle: Left ventricular ejection fraction, by estimation, is 70 to 75%. Left ventricular ejection fraction by 3D volume is 71 %. The left ventricle has hyperdynamic function. The left ventricle has no regional wall motion abnormalities. The global longitudinal strain is normal despite suboptimal segment tracking. The left ventricular internal cavity size was normal in size. There is no left ventricular hypertrophy. Left ventricular diastolic function could not be evaluated.  Right Ventricle: The right ventricular size is normal. No increase in right ventricular wall thickness. Right ventricular systolic function is normal. There is moderately elevated pulmonary artery systolic pressure. The tricuspid regurgitant velocity is 3.34 m/s, and with an  assumed right atrial pressure of 3 mmHg, the estimated right ventricular systolic pressure is 47.6 mmHg.  Left Atrium: Left atrial size was severely dilated.  Right Atrium: Right atrial size was normal in size.  Pericardium: There is no evidence of pericardial effusion.  Mitral Valve: The mitral valve is myxomatous. There is severe holosystolic prolapse of multiple scallops of the posterior leaflet of the mitral valve. Moderate to severe mitral valve regurgitation. No evidence of mitral valve stenosis.  Tricuspid Valve: The tricuspid valve is normal in structure. Tricuspid valve regurgitation is moderate . No evidence of tricuspid stenosis.  Aortic Valve: The aortic valve is normal in structure. Aortic valve regurgitation is not visualized. No aortic stenosis is present.  Pulmonic Valve: The pulmonic valve was normal in structure. Pulmonic valve regurgitation is trivial. No evidence of pulmonic stenosis.  Aorta: The aortic root is normal in size and structure.  Venous: The inferior vena cava is normal in size with greater than 50% respiratory variability, suggesting right atrial pressure of 3 mmHg.  IAS/Shunts: No atrial level shunt detected by color flow Doppler.   LEFT VENTRICLE PLAX 2D LVIDd:         3.40 cm LVIDs:         2.30 cm         2D LV PW:         1.60 cm         Longitudinal LV IVS:        1.00 cm         Strain LVOT diam:     2.10 cm         2D Strain GLS  -23.4 % LV SV:         78              (A2C): LV SV Index:   47              2D Strain GLS  -25.8 % LVOT Area:     3.46 cm        (A3C): 2D Strain GLS  -21.6 % (A4C): 2D Strain GLS  -23.6 % Avg:  3D Volume EF LV 3D EF:    Left ventricul ar ejection fraction by 3D volume is 71 %.  3D Volume EF: 3D EF:        71 % LV EDV:       126 ml LV ESV:       36 ml LV SV:        90 ml  RIGHT VENTRICLE RV Basal diam:  3.10 cm RV Mid diam:    2.00 cm TAPSE (M-mode): 1.6 cm RVSP:           52.6 mmHg  LEFT  ATRIUM             Index          RIGHT ATRIUM           Index LA diam:        4.50 cm 2.74 cm/m   RA Pressure: 8.00 mmHg LA Vol (A2C):   95.7 ml 58.29 ml/m  RA Area:     16.10 cm LA Vol (A4C):   87.2 ml 53.12 ml/m  RA Volume:   33.70 ml  20.53 ml/m LA Biplane Vol: 91.6 ml 55.80 ml/m AORTIC VALVE LVOT Vmax:   103.00 cm/s LVOT Vmean:  64.800 cm/s LVOT VTI:    0.225 m  AORTA Ao Root diam: 2.90 cm Ao Asc diam:  3.40 cm  MITRAL VALVE                  TRICUSPID VALVE MV Area (PHT): 1.85 cm       TR Peak grad:   44.6 mmHg MV Decel Time: 409 msec       TR Vmax:        334.00 cm/s MR Peak grad:    97.0 mmHg    Estimated RAP:  8.00 mmHg MR Mean grad:    61.5 mmHg    RVSP:           52.6 mmHg MR Vmax:         492.50 cm/s MR Vmean:        362.5 cm/s   SHUNTS MR PISA:         2.26 cm     Systemic VTI:  0.22 m MR PISA Eff ROA: 18 mm       Systemic Diam: 2.10 cm MR PISA Radius:  0.60 cm MV E velocity: 58.20 cm/s MV A velocity: 69.20 cm/s MV E/A ratio:  0.84  Traci Turner MD Electronically signed by Traci Turner MD Signature Date/Time: 01/10/2023/1:24:29 PM    Final (Updated)    MONITORS  CARDIAC EVENT MONITOR 05/01/2017            EKG:  EKG is ordered today.  The ekg ordered today demonstrates sinus bradycardia 57 bpm, right superior axis deviation, otherwise normal  Recent Labs: No results found for requested labs within last 365 days.  Recent Lipid Panel    Component Value Date/Time   CHOL 180 06/04/2016 0903   TRIG 47.0 06/04/2016 0903   HDL 90.00 06/04/2016 0903   CHOLHDL 2 06/04/2016 0903   VLDL 9.4 06/04/2016 0903   LDLCALC 80 06/04/2016 0903     Risk Assessment/Calculations:                Physical Exam:    VS:  BP 110/66   Pulse (!) 57   Ht 5' 1" (1.549 m)   Wt 144 lb 3.2 oz (65.4 kg)   SpO2 95%   BMI 27.25 kg/m     Wt Readings from Last 3 Encounters:  01/27/23 144 lb 3.2 oz (65.4 kg)  10/11/22 143 lb 12.8 oz (65.2 kg)  08/14/21 139 lb (63  kg)     GEN:  Well nourished, well developed in no acute distress HEENT: Normal NECK: No JVD; No carotid bruits LYMPHATICS: No lymphadenopathy CARDIAC: RRR, 2/6 mid-to-late systolic murmur only heard at the left mid-axillary line RESPIRATORY:  Clear to auscultation without rales, wheezing or rhonchi  ABDOMEN: Soft, non-tender, non-distended MUSCULOSKELETAL:  No edema; No deformity  SKIN: Warm and dry NEUROLOGIC:  Alert and oriented x 3 PSYCHIATRIC:  Normal affect   ASSESSMENT:    1. Severe mitral valve regurgitation    PLAN:      In order of problems listed above:  The patient appears to have severe, stage D, mitral regurgitation with myxomatous degeneration of the mitral valve and bileaflet prolapse with marked posterior leaflet prolapse.  Her symptoms are NYHA functional class II at present.  I personally reviewed her echo images which show normal LV and RV function with an LVEF of greater than 65%.  The estimated RV systolic pressure is increased at 48 mmHg.  There is severe myxomatous change with thickening and prolapse of the posterior mitral leaflet with moderate to severe eccentric mitral regurgitation.  There is a spherical structure seen on the posterior subvalvular apparatus likely related to myxomatous thickening.  I reviewed the natural history of mitral regurgitation with the patient and her husband today.  I think she needs further evaluation with a transesophageal echo to better assess the functional anatomy of her mitral valve.  I also think a right and left heart catheterization is indicated to assess the hemodynamics of her mitral regurgitation, right heart pressures, and evaluate coronary anatomy to aid in decision making regarding her treatment options.  Risks, indications, and alternatives to transesophageal echocardiography are reviewed today. I have reviewed the risks, indications, and alternatives to cardiac catheterization, possible angioplasty, and stenting with the  patient. Risks include but are not limited to bleeding, infection, vascular injury, stroke, myocardial infection, arrhythmia, kidney injury, radiation-related injury in the case of prolonged fluoroscopy use, emergency cardiac surgery, and death. The patient understands the risks of serious complication is 1-2 in 1000 with diagnostic cardiac cath and 1-2% or less with angioplasty/stenting.  Once her TEE and cardiac catheterization studies are completed, she will be referred for formal cardiac surgical consultation to mitral valve specialist and her case will be reviewed with our heart valve team.  We contrasted some of the pros and cons with minimally invasive mitral valve surgery versus transcatheter edge-to-edge repair of the mitral valve.  I am concerned that her anatomy may not be favorable for TEER.       Shared Decision Making/Informed Consent The risks [esophageal damage, perforation (1:10,000 risk), bleeding, pharyngeal hematoma as well as other potential complications associated with conscious sedation including aspiration, arrhythmia, respiratory failure and death], benefits (treatment guidance and diagnostic support) and alternatives of a transesophageal echocardiogram were discussed in detail with Ms. Stowell and she is willing to proceed.     Medication Adjustments/Labs and Tests Ordered: Current medicines are reviewed at length with the patient today.  Concerns regarding medicines are outlined above.  Orders Placed This Encounter  Procedures   EKG 12-Lead   No orders of the defined types were placed in this encounter.   Patient Instructions  Medication Instructions:  Your physician recommends that you continue on your current medications as directed. Please refer to the Current Medication list given to you today.  *If you need a refill on your cardiac medications before your next appointment, please call your pharmacy*   Lab Work: CBC, BMET (within 7 days of procedure) If you  have labs (blood work) drawn today and your tests are completely normal, you will receive your results only by: MyChart Message (if you have MyChart) OR A paper copy in the mail If you have any lab test that is abnormal or we need to change your treatment, we will call you to review the results.  Testing/Procedures: Transesophageal Echocardiogram (you will be contacted to schedule) Your physician has requested that you have a TEE. During a TEE, sound waves are used to create images of   your heart. It provides your doctor with information about the size and shape of your heart and how well your heart's chambers and valves are working. In this test, a transducer is attached to the end of a flexible tube that's guided down your throat and into your esophagus (the tube leading from you mouth to your stomach) to get a more detailed image of your heart. You are not awake for the procedure. Please see the instruction sheet given to you today. For further information please visit www.[email or web address].  R & L Heart Catheterization (to be done same day as TEE) Your physician has requested that you have a cardiac catheterization. Cardiac catheterization is used to diagnose and/or treat various heart conditions. Doctors may recommend this procedure for a number of different reasons. The most common reason is to evaluate chest pain. Chest pain can be a symptom of coronary artery disease (CAD), and cardiac catheterization can show whether plaque is narrowing or blocking your heart's arteries. This procedure is also used to evaluate the valves, as well as measure the blood flow and oxygen levels in different parts of your heart. For further information please visit www.[email or web address]. Please follow instruction sheet, as given.  Follow-Up: At [facility], you and your health needs are our priority.  As part of our continuing mission to provide you with exceptional heart care, we have created designated  Provider Care Teams.  These Care Teams include your primary Cardiologist (physician) and Advanced Practice Providers (APPs -  Physician Assistants and Nurse Practitioners) who all work together to provide you with the care you need, when you need it.  Your next appointment:   Structural team will follow-up  Provider:   Jancy Sprankle, MD        Signed, Talia Hoheisel, MD  01/27/2023 5:48 PM    [facility]  

## 2023-01-28 DIAGNOSIS — I340 Nonrheumatic mitral (valve) insufficiency: Secondary | ICD-10-CM

## 2023-01-30 NOTE — Telephone Encounter (Signed)
Spoke with the patient and reviewed instructions for her procedures with her. I have also placed a letter in her MyChart with these instructions so that she is able to print them out. She requests that lab orders be faxed to [facility] at [phone].

## 2023-01-30 NOTE — Telephone Encounter (Signed)
Pt called in stating sarah was supposed to call but she didn't hear from her. She asked if Maralyn Sago, RN was here today, informed her she is not and she asked if another nurse can call to go over instructions with her. Please advise.

## 2023-02-12 ENCOUNTER — Telehealth: Payer: Self-pay | Admitting: *Deleted

## 2023-02-12 NOTE — Pre-Procedure Instructions (Signed)
Spoke with patient regarding TEE and heart cath scheduled for tomorrow.  She knows to arrive at 11:00am, nothing to eat or drink after midnight.  She know she needs someone to drive her home as well as stay with her for 24 hours.  She knows to take am meds with sips of water.

## 2023-02-12 NOTE — Telephone Encounter (Signed)
Cardiac Catheterization scheduled at [facility] for: Thursday Feb 13, 2023 1:30 PM/TEE 12 Noon Arrival time [facility] Main Entrance A at: 11 AM  Nothing to eat or drink prior to procedures, may have sips of water to take medications   Medication instructions: -Usual morning medications can be taken with sips of water including aspirin 81 mg.  Pt will hold miralax morning of procedure  Confirmed patient has responsible adult to drive home post procedure and be with patient first 24 hours after arriving home.  Plan to go home the same day, you will only stay overnight if medically necessary.  Reviewed procedure instructions with patient.

## 2023-02-13 ENCOUNTER — Encounter (HOSPITAL_COMMUNITY)
Admission: RE | Disposition: A | Discharge status: Home / Self Care | Payer: Self-pay | Source: Home / Self Care | Attending: Cardiovascular Disease

## 2023-02-13 ENCOUNTER — Ambulatory Visit (HOSPITAL_COMMUNITY)
Admission: RE | Admit: 2023-02-13 | Discharge: 2023-02-13 | Disposition: A | Discharge status: Home / Self Care | Payer: Medicare Other | Attending: Cardiovascular Disease | Admitting: Cardiovascular Disease

## 2023-02-13 ENCOUNTER — Ambulatory Visit (HOSPITAL_COMMUNITY): Payer: Medicare Other | Admitting: Registered Nurse

## 2023-02-13 ENCOUNTER — Ambulatory Visit (HOSPITAL_BASED_OUTPATIENT_CLINIC_OR_DEPARTMENT_OTHER): Payer: Medicare Other

## 2023-02-13 ENCOUNTER — Encounter (HOSPITAL_COMMUNITY): Payer: Self-pay | Admitting: Cardiovascular Disease

## 2023-02-13 ENCOUNTER — Ambulatory Visit (HOSPITAL_BASED_OUTPATIENT_CLINIC_OR_DEPARTMENT_OTHER): Payer: Medicare Other | Admitting: Registered Nurse

## 2023-02-13 ENCOUNTER — Other Ambulatory Visit: Payer: Self-pay

## 2023-02-13 DIAGNOSIS — I340 Nonrheumatic mitral (valve) insufficiency: Secondary | ICD-10-CM

## 2023-02-13 DIAGNOSIS — I739 Peripheral vascular disease, unspecified: Secondary | ICD-10-CM

## 2023-02-13 DIAGNOSIS — I341 Nonrheumatic mitral (valve) prolapse: Secondary | ICD-10-CM | POA: Diagnosis not present

## 2023-02-13 DIAGNOSIS — I081 Rheumatic disorders of both mitral and tricuspid valves: Secondary | ICD-10-CM | POA: Insufficient documentation

## 2023-02-13 DIAGNOSIS — G4730 Sleep apnea, unspecified: Secondary | ICD-10-CM

## 2023-02-13 HISTORY — PX: RIGHT/LEFT HEART CATH AND CORONARY ANGIOGRAPHY: CATH118266

## 2023-02-13 HISTORY — PX: TEE WITHOUT CARDIOVERSION: SHX5443

## 2023-02-13 LAB — POCT I-STAT EG7
Acid-base deficit: 1 mmol/L (ref 0.0–2.0)
Bicarbonate: 23 mmol/L (ref 20.0–28.0)
Calcium, Ion: 1.2 mmol/L (ref 1.15–1.40)
HCT: 38 % (ref 36.0–46.0)
Hemoglobin: 12.9 g/dL (ref 12.0–15.0)
O2 Saturation: 71 %
Potassium: 4 mmol/L (ref 3.5–5.1)
Sodium: 139 mmol/L (ref 135–145)
TCO2: 24 mmol/L (ref 22–32)
pCO2, Ven: 35.5 mmHg — ABNORMAL LOW (ref 44–60)
pH, Ven: 7.419 (ref 7.25–7.43)
pO2, Ven: 36 mmHg (ref 32–45)

## 2023-02-13 LAB — POCT I-STAT 7, (LYTES, BLD GAS, ICA,H+H)
Acid-base deficit: 4 mmol/L — ABNORMAL HIGH (ref 0.0–2.0)
Bicarbonate: 21.6 mmol/L (ref 20.0–28.0)
Calcium, Ion: 1.18 mmol/L (ref 1.15–1.40)
HCT: 37 % (ref 36.0–46.0)
Hemoglobin: 12.6 g/dL (ref 12.0–15.0)
O2 Saturation: 81 %
Potassium: 3.6 mmol/L (ref 3.5–5.1)
Sodium: 134 mmol/L — ABNORMAL LOW (ref 135–145)
TCO2: 23 mmol/L (ref 22–32)
pCO2 arterial: 41.5 mmHg (ref 32–48)
pH, Arterial: 7.324 — ABNORMAL LOW (ref 7.35–7.45)
pO2, Arterial: 49 mmHg — ABNORMAL LOW (ref 83–108)

## 2023-02-13 LAB — ECHO TEE
MV M vel: 4.28 m/s
MV Peak grad: 73.3 mmHg
Radius: 0.6 cm

## 2023-02-13 SURGERY — RIGHT/LEFT HEART CATH AND CORONARY ANGIOGRAPHY
Anesthesia: LOCAL

## 2023-02-13 SURGERY — ECHOCARDIOGRAM, TRANSESOPHAGEAL
Anesthesia: Monitor Anesthesia Care

## 2023-02-13 MED ORDER — FENTANYL CITRATE (PF) 100 MCG/2ML IJ SOLN
INTRAMUSCULAR | Status: DC | PRN
  Administered 2023-02-13: 25 ug via INTRAVENOUS

## 2023-02-13 MED ORDER — ASPIRIN 81 MG PO CHEW: 81.0000 mg | CHEWABLE_TABLET | ORAL | Status: DC

## 2023-02-13 MED ORDER — ONDANSETRON HCL 4 MG/2ML IJ SOLN: 4.0000 mg | Freq: Four times a day (QID) | INTRAMUSCULAR | Status: DC | PRN

## 2023-02-13 MED ORDER — LIDOCAINE 2% (20 MG/ML) 5 ML SYRINGE
INTRAMUSCULAR | Status: DC | PRN
  Administered 2023-02-13: 100 mg via INTRAVENOUS

## 2023-02-13 MED ORDER — IOHEXOL 350 MG/ML SOLN
INTRAVENOUS | Status: DC | PRN
  Administered 2023-02-13: 85 mL

## 2023-02-13 MED ORDER — HEPARIN SODIUM (PORCINE) 1000 UNIT/ML IJ SOLN
INTRAMUSCULAR | Status: AC
  Filled 2023-02-13: qty 10

## 2023-02-13 MED ORDER — HEPARIN (PORCINE) IN NACL 1000-0.9 UT/500ML-% IV SOLN
INTRAVENOUS | Status: DC | PRN
  Administered 2023-02-13 (×2): 500 mL

## 2023-02-13 MED ORDER — PHENYLEPHRINE HCL-NACL 20-0.9 MG/250ML-% IV SOLN
INTRAVENOUS | Status: DC | PRN
  Administered 2023-02-13: 40 ug/min via INTRAVENOUS

## 2023-02-13 MED ORDER — VERAPAMIL HCL 2.5 MG/ML IV SOLN
INTRAVENOUS | Status: AC
  Filled 2023-02-13: qty 2

## 2023-02-13 MED ORDER — ACETAMINOPHEN 325 MG PO TABS: 650.0000 mg | ORAL_TABLET | ORAL | Status: DC | PRN

## 2023-02-13 MED ORDER — LIDOCAINE HCL (PF) 1 % IJ SOLN
INTRAMUSCULAR | Status: AC
  Filled 2023-02-13: qty 30

## 2023-02-13 MED ORDER — HEPARIN SODIUM (PORCINE) 1000 UNIT/ML IJ SOLN
INTRAMUSCULAR | Status: DC | PRN
  Administered 2023-02-13: 3000 [IU] via INTRAVENOUS

## 2023-02-13 MED ORDER — LABETALOL HCL 5 MG/ML IV SOLN: 10.0000 mg | INTRAVENOUS | Status: DC | PRN

## 2023-02-13 MED ORDER — SODIUM CHLORIDE 0.9% FLUSH: 3.0000 mL | INTRAVENOUS | Status: DC | PRN

## 2023-02-13 MED ORDER — SODIUM CHLORIDE 0.9 % IV SOLN: INTRAVENOUS | Status: DC

## 2023-02-13 MED ORDER — LIDOCAINE HCL (PF) 1 % IJ SOLN
INTRAMUSCULAR | Status: DC | PRN
  Administered 2023-02-13 (×2): 2 mL

## 2023-02-13 MED ORDER — SODIUM CHLORIDE 0.9% FLUSH: 3.0000 mL | Freq: Two times a day (BID) | INTRAVENOUS | Status: DC

## 2023-02-13 MED ORDER — PROPOFOL 500 MG/50ML IV EMUL
INTRAVENOUS | Status: DC | PRN
  Administered 2023-02-13: 75 ug/kg/min via INTRAVENOUS
  Administered 2023-02-13: 50 mg via INTRAVENOUS

## 2023-02-13 MED ORDER — VERAPAMIL HCL 2.5 MG/ML IV SOLN
INTRAVENOUS | Status: DC | PRN
  Administered 2023-02-13: 10 mL via INTRA_ARTERIAL

## 2023-02-13 MED ORDER — SODIUM CHLORIDE 0.9 % IV SOLN: 250.0000 mL | INTRAVENOUS | Status: DC | PRN

## 2023-02-13 MED ORDER — SODIUM CHLORIDE 0.9 % WEIGHT BASED INFUSION: 1.0000 mL/kg/h | INTRAVENOUS | Status: DC

## 2023-02-13 MED ORDER — MIDAZOLAM HCL 2 MG/2ML IJ SOLN
INTRAMUSCULAR | Status: DC | PRN
  Administered 2023-02-13: 1 mg via INTRAVENOUS

## 2023-02-13 MED ORDER — MIDAZOLAM HCL 2 MG/2ML IJ SOLN
INTRAMUSCULAR | Status: AC
  Filled 2023-02-13: qty 2

## 2023-02-13 MED ORDER — HYDRALAZINE HCL 20 MG/ML IJ SOLN: 10.0000 mg | INTRAMUSCULAR | Status: DC | PRN

## 2023-02-13 MED ORDER — FENTANYL CITRATE (PF) 100 MCG/2ML IJ SOLN
INTRAMUSCULAR | Status: AC
  Filled 2023-02-13: qty 2

## 2023-02-13 MED ORDER — EPHEDRINE SULFATE-NACL 50-0.9 MG/10ML-% IV SOSY
PREFILLED_SYRINGE | INTRAVENOUS | Status: DC | PRN
  Administered 2023-02-13: 5 mg via INTRAVENOUS

## 2023-02-13 SURGICAL SUPPLY — 15 items
CATH 5FR JL3.5 JR4 ANG PIG MP (CATHETERS) IMPLANT
CATH BALLN WEDGE 5F 110CM (CATHETERS) IMPLANT
DEVICE RAD TR BAND REGULAR (VASCULAR PRODUCTS) IMPLANT
GLIDESHEATH SLEND SS 6F .021 (SHEATH) IMPLANT
GUIDEWIRE INQWIRE 1.5J.035X260 (WIRE) IMPLANT
INQWIRE 1.5J .035X260CM (WIRE) ×1
KIT HEART LEFT (KITS) ×1 IMPLANT
PACK CARDIAC CATHETERIZATION (CUSTOM PROCEDURE TRAY) ×1 IMPLANT
SHEATH GLIDE SLENDER 4/5FR (SHEATH) IMPLANT
SYR MEDRAD MARK 7 150ML (SYRINGE) IMPLANT
TRANSDUCER W/STOPCOCK (MISCELLANEOUS) ×1 IMPLANT
TUBING CIL FLEX 10 FLL-RA (TUBING) ×1 IMPLANT
TUBING CONTRAST HIGH PRESS 48 (TUBING) IMPLANT
WIRE EMERALD 3MM-J .025X260CM (WIRE) IMPLANT
WIRE HI TORQ VERSACORE-J 145CM (WIRE) IMPLANT

## 2023-02-13 NOTE — Transfer of Care (Signed)
Immediate Anesthesia Transfer of Care Note  Patient: Tanya Leon  Procedure(s) Performed: TRANSESOPHAGEAL ECHOCARDIOGRAM  Patient Location: PACU and Cath Lab  Anesthesia Type:MAC  Level of Consciousness: awake, alert , and oriented  Airway & Oxygen Therapy: Patient Spontanous Breathing  Post-op Assessment: Report given to RN and Post -op Vital signs reviewed and stable  Post vital signs: Reviewed and stable  Last Vitals:  Vitals Value Taken Time  BP    Temp    Pulse    Resp    SpO2      Last Pain:  Vitals:   02/13/23 1053  PainSc: 0-No pain         Complications: There were no known notable events for this encounter.

## 2023-02-13 NOTE — Interval H&P Note (Signed)
History and Physical Interval Note:  02/13/2023 3:01 PM  Tanya Leon  has presented today for surgery, with the diagnosis of mr.  The various methods of treatment have been discussed with the patient and family. After consideration of risks, benefits and other options for treatment, the patient has consented to  Procedure(s): RIGHT/LEFT HEART CATH AND CORONARY ANGIOGRAPHY (N/A) as a surgical intervention.  The patient's history has been reviewed, patient examined, no change in status, stable for surgery.  I have reviewed the patient's chart and labs.  Questions were answered to the patient's satisfaction.     Tonny Bollman

## 2023-02-13 NOTE — Discharge Instructions (Signed)
Drink plenty of fluids for 48 hours and keep wrist elevated at heart level for 24 hours  Radial Site Care   This sheet gives you information about how to care for yourself after your procedure. Your health care provider may also give you more specific instructions. If you have problems or questions, contact your health care provider. What can I expect after the procedure? After the procedure, it is common to have: Bruising and tenderness at the catheter insertion area. Follow these instructions at home: Medicines Take over-the-counter and prescription medicines only as told by your health care provider. Insertion site care Follow instructions from your health care provider about how to take care of your insertion site. Make sure you: Wash your hands with soap and water before you change your bandage (dressing). If soap and water are not available, use hand sanitizer. Remove your dressing at 6:00 pm Check your insertion site every day for signs of infection. Check for: Redness, swelling, or pain. Fluid or blood. Pus or a bad smell. Warmth. Do not take baths, swim, or use a hot tub until your health care provider approves. You may shower after removing the dressing Pat the area dry with a clean towel. Do not rub the site. That could cause bleeding. Do not apply powder or lotion to the site. Activity   For 24 hours after the procedure, or as directed by your health care provider: Do not flex or bend the affected arm. Do not push or pull heavy objects with the affected arm. Do not drive yourself home from the hospital or clinic. You may drive 24 hours after the procedure unless your health care provider tells you not to. Do not operate machinery or power tools. Do not lift anything that is heavier than 10 lb (4.5 kg), for 4 days Ask your health care provider when it is okay to: Return to work or school. Resume usual physical activities or sports. Resume sexual activity. General  instructions If the catheter site starts to bleed, raise your arm and put firm pressure on the site for 15 minutes. If the bleeding does not stop. Reapply pressure and get help right away. This is a medical emergency. A responsible adult should be with you for the first 24 hours after you arrive home. Keep all follow-up visits as told by your health care provider. This is important. Contact a health care provider if: You have a fever. You have redness, swelling, or yellow drainage around your insertion site. Get help right away if: You have unusual pain at the radial site. The catheter insertion area swells very fast. The insertion area is bleeding, and the bleeding does not stop when you hold steady pressure on the area. Your arm or hand becomes pale, cool, tingly, or numb. These symptoms may represent a serious problem that is an emergency. Do not wait to see if the symptoms will go away. Get medical help right away. Call your local emergency services (911 in the [place] Do not drive yourself to the hospital. Summary After the procedure, it is common to have bruising and tenderness at the site. Follow instructions from your health care provider about how to take care of your radial site wound. Check the wound every day for signs of infection. Do not lift anything that is heavier than 10 lb (4.5 kg), or the limit that you are told, until your health care provider says that it is safe. This information is not intended to replace advice given to you  by your health care provider. Make sure you discuss any questions you have with your health care provider. Document Revised: 10/22/2017 Document Reviewed: 10/22/2017 Elsevier Patient Education  2020 [employer].

## 2023-02-13 NOTE — Anesthesia Preprocedure Evaluation (Addendum)
Anesthesia Evaluation  Patient identified by MRN, date of birth, ID band Patient awake    Reviewed: Allergy & Precautions, NPO status , Patient's Chart, lab work & pertinent test results  Airway Mallampati: I  TM Distance: >3 FB Neck ROM: Full    Dental  (+) Caps, Dental Advisory Given   Pulmonary sleep apnea    breath sounds clear to auscultation       Cardiovascular + Peripheral Vascular Disease   Rhythm:Regular Rate:Normal + Systolic murmurs Echo:  1. Left ventricular ejection fraction, by estimation, is 70 to 75%. Left  ventricular ejection fraction by 3D volume is 71 %. The left ventricle has  hyperdynamic function. The left ventricle has no regional wall motion  abnormalities. Left ventricular  diastolic function could not be evaluated. The global longitudinal strain  is normal despite suboptimal segment tracking.   2. Right ventricular systolic function is normal. The right ventricular  size is normal. There is moderately elevated pulmonary artery systolic  pressure. The estimated right ventricular systolic pressure is 47.6 mmHg.   3. Left atrial size was severely dilated.   4. 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 Moderate to severe mitral  valve regurgitation. No evidence of mitral stenosis.   5. 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   6. The aortic valve is normal in structure. Aortic valve regurgitation is  not visualized. No aortic stenosis is present.   7. The inferior vena cava is normal in size with greater than 50%  respiratory variability, suggesting right atrial pressure of 3 mmHg.   8. Compared to prior study, PA pressures have increased. Consider  referral to Structural Heart Team.     Neuro/Psych  Headaches PSYCHIATRIC DISORDERS  Depression       GI/Hepatic   Endo/Other    Renal/GU      Musculoskeletal    Abdominal   Peds  Hematology   Anesthesia Other Findings   Reproductive/Obstetrics                             Anesthesia Physical Anesthesia Plan  ASA: 2  Anesthesia Plan: MAC   Post-op Pain Management: Minimal or no pain anticipated   Induction: Intravenous  PONV Risk Score and Plan: 0 and Propofol infusion  Airway Management Planned: Natural Airway and Nasal Cannula  Additional Equipment: None  Intra-op Plan:   Post-operative Plan:   Informed Consent: I have reviewed the patients History and Physical, chart, labs and discussed the procedure including the risks, benefits and alternatives for the proposed anesthesia with the patient or authorized representative who has indicated his/her understanding and acceptance.       Plan Discussed with: CRNA  Anesthesia Plan Comments:        Anesthesia Quick Evaluation

## 2023-02-13 NOTE — Progress Notes (Signed)
  Echocardiogram Echocardiogram Transesophageal has been performed.  Tanya Leon 02/13/2023, 12:18 PM

## 2023-02-13 NOTE — Interval H&P Note (Signed)
History and Physical Interval Note:  02/13/2023 2:20 PM  Tanya Leon  has presented today for surgery, with the diagnosis of MITRAL REGURGIGATION.  The various methods of treatment have been discussed with the patient and family. After consideration of risks, benefits and other options for treatment, the patient has consented to  Procedure(s): TRANSESOPHAGEAL ECHOCARDIOGRAM (N/A) as a surgical intervention.  The patient's history has been reviewed, patient examined, no change in status, stable for surgery.  I have reviewed the patient's chart and labs.  Questions were answered to the patient's satisfaction.     Charlton Haws

## 2023-02-14 ENCOUNTER — Encounter (HOSPITAL_COMMUNITY): Payer: Self-pay | Admitting: Cardiovascular Disease

## 2023-02-14 NOTE — Anesthesia Postprocedure Evaluation (Signed)
Anesthesia Post Note  Patient: Tanya Leon  Procedure(s) Performed: TRANSESOPHAGEAL ECHOCARDIOGRAM     Patient location during evaluation: Cath Lab Anesthesia Type: MAC Level of consciousness: awake and alert Pain management: pain level controlled Vital Signs Assessment: post-procedure vital signs reviewed and stable Respiratory status: spontaneous breathing, nonlabored ventilation and respiratory function stable Cardiovascular status: stable Postop Assessment: no apparent nausea or vomiting Anesthetic complications: no   There were no known notable events for this encounter.  Last Vitals:  Vitals:   02/13/23 1725 02/13/23 1755  BP: (!) 136/91 116/62  Pulse: (!) 59 (!) 58  Resp:    Temp:    SpO2: 94% (!) 88%    Last Pain:  Vitals:   02/13/23 1602  TempSrc:   PainSc: 0-No pain                 Drury Ardizzone

## 2023-02-17 ENCOUNTER — Encounter: Payer: Self-pay | Admitting: Thoracic Surgery (Cardiothoracic Vascular Surgery)

## 2023-02-17 ENCOUNTER — Institutional Professional Consult (permissible substitution) (INDEPENDENT_AMBULATORY_CARE_PROVIDER_SITE_OTHER): Payer: Medicare Other | Admitting: Thoracic Surgery (Cardiothoracic Vascular Surgery)

## 2023-02-17 VITALS — BP 177/89 | HR 62 | Resp 18 | Ht 61.0 in | Wt 141.0 lb

## 2023-02-17 DIAGNOSIS — I340 Nonrheumatic mitral (valve) insufficiency: Secondary | ICD-10-CM

## 2023-02-17 NOTE — Patient Instructions (Signed)
Follow up with cardiology for symptom/echo surviellence

## 2023-02-17 NOTE — Progress Notes (Signed)
[address]       [place] [address]             [phone]           AVELYN BLUNCK [facility] Medical Record #[id number] Date of Birth: [birth year]  Runell Gess, MD Nadara Eaton, MD  Chief Complaint: Mitral and tricuspid regurgitation  History of Present Illness:     Pt is an 82 yo female followed for MR which has been felt to be secondary to [facility] pathology. Pt's symptoms are that when she pushes herself or after climbing 3 flight of stairs, she would feel SOB but in a short time resolve. She has no CP, PND, lower ext edema. 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 She was felt to perhaps require intervention and  cath showed normal coronary anatomy and low filling pressures with no PHTN. She had TEE with impressive Barlow's pathology and moderate MR with severe TR from again a very prolapsing valve. She was not a candidate for mitral clip and surgical consultation was requested. Of note her father had MV replacement at age 54 with mechanical valve for dilated LV and lived to 14      Past Medical History:  Diagnosis Date   Allergy 1973   Seasonal   Arthritis    Blood transfusion without reported diagnosis 08/15/2008   Following hip replacement   Cancer (HCC)    hx of skin cancer    Cataract 1998   Cataract surgery both eyes   Colon polyps    adenomatous   Complication of anesthesia    " pt stated they gave me too much - kept having to be reminded to breathe    Constipation    past hx- no longer an issue    Depression    hx of    Diverticulosis    DIVERTICULOSIS, COLON 01/13/2009   Glaucoma 05-12-14   Headache    history of migraines   Heart murmur    benign, dagnosed around age 2,prophylactic antibiotic   Low back pain    Lumbar spondylolysis    Osteopenia    Pneumonia    hx of as a baby    Pulmonary  hypertension (HCC)    monitored by cards Dr Allyson Sabal annually via echo    PVC's (premature ventricular contractions)    i have them periodically , Dr Allyson Sabal had me cut back on caffeine and that helped    Rectocele    Sleep apnea    wears bi-pap, no oxygen    Tubular adenoma of colon    Wears hearing aid in both ears     Past Surgical History:  Procedure Laterality Date   ABDOMINAL HERNIA REPAIR Right    incisional   ABDOMINAL HYSTERECTOMY     with ovaries   BACK SURGERY  2009   L4-L5 with rods   BIOPSY  11/13/2018   Procedure: BIOPSY;  Surgeon: Beverley Fiedler, MD;  Location: [facility];  Service: Gastroenterology;;   BLADDER SUSPENSION N/A 04/09/2017   Procedure: TRANSVAGINAL TAPE (TVT) PROCEDURE;  Surgeon: Carrington Clamp, MD;  Location: [facility];  Service: Gynecology;  Laterality: N/A;   CATARACT EXTRACTION, BILATERAL     cataract left lens replacement     2016   COLON SURGERY  2007   Rpr abscessed  ruptured diverticuli   COLONOSCOPY     COLONOSCOPY N/A 08/29/2017   Procedure: COLONOSCOPY;  Surgeon: Beverley Fiedler, MD;  Location: [facility];  Service: Gastroenterology;  Laterality: N/A;   COLONOSCOPY WITH PROPOFOL N/A 11/13/2018   Procedure: COLONOSCOPY WITH PROPOFOL with Methylane Blue Injection;  Surgeon: Beverley Fiedler, MD;  Location: [facility];  Service: Gastroenterology;  Laterality: N/A;   CYSTOCELE REPAIR N/A 04/09/2017   Procedure: ANTERIOR REPAIR (CYSTOCELE);  Surgeon: Carrington Clamp, MD;  Location: [facility];  Service: Gynecology;  Laterality: N/A;   CYSTOSCOPY N/A 04/09/2017   Procedure: CYSTOSCOPY;  Surgeon: Carrington Clamp, MD;  Location: [facility];  Service: Gynecology;  Laterality: N/A;   EYE MUSCLE SURGERY     FRACTURE SURGERY  11-13-17   L wrist-open reduction internal fixation   GLAUCOMA SURGERY Left 05/12/14   HERNIA REPAIR  05-28-11   Hernia result of 2007 colon surg   incisional ab  05/28/11   JOINT REPLACEMENT     right hip replacement    LAPAROSCOPIC RIGHT  HEMI COLECTOMY Right 04/30/2019   Procedure: LAPAROSCOPIC  RIGHT HEMI COLECTOMY;  Surgeon: Andria Meuse, MD;  Location: [facility];  Service: General;  Laterality: Right;   LUMBAR FUSION     L4-L5   PARTIAL KNEE ARTHROPLASTY Right    PARTIAL KNEE ARTHROPLASTY Left    perforated deverticular abscess/laporotomy and colostomy     depression after this   POLYPECTOMY N/A 08/29/2017   Procedure: POLYPECTOMY;  Surgeon: Beverley Fiedler, MD;  Location: [facility];  Service: Gastroenterology;  Laterality: N/A;   POLYPECTOMY  11/13/2018   Procedure: POLYPECTOMY;  Surgeon: Beverley Fiedler, MD;  Location: [facility];  Service: Gastroenterology;;   POLYPECTOMY     RECTOCELE REPAIR  01/13/2020   RIGHT/LEFT HEART CATH AND CORONARY ANGIOGRAPHY N/A 02/13/2023   Procedure: RIGHT/LEFT HEART CATH AND CORONARY ANGIOGRAPHY;  Surgeon: Tonny Bollman, MD;  Location: [facility]AS[facility] CV LAB;  Service: Cardiovascular;  Laterality: N/A;   SPINE SURGERY  02-16-08   Fusion L4L5   stribismus eye surgery Right    take down of colostomy     TEE WITHOUT CARDIOVERSION N/A 02/13/2023   Procedure: TRANSESOPHAGEAL ECHOCARDIOGRAM;  Surgeon: Wendall Stade, MD;  Location: [facility] INVAS[facility] CV LAB;  Service: Cardiovascular;  Laterality: N/A;   tonsillectomly     TONSILLECTOMY     TOTAL HIP ARTHROPLASTY Right    TOTAL KNEE REVISION Left 11/24/2015   Procedure: Conversion from left lateral unicompartmental knee arthroplasty to left total knee arthroplasty;  Surgeon: Kathryne Hitch, MD;  Location: [facility];  Service: Orthopedics;  Laterality: Left;   TUBAL LIGATION  1976   WRIST SURGERY Right 03/27/2020    Social History   Tobacco Use  Smoking Status Never  Smokeless Tobacco Never    Social History   Substance and Sexual Activity  Alcohol Use Yes   Alcohol/week: 7.0 standard drinks of alcohol   Types: 7 Glasses of wine per week   Comment: 1 glass wine with dinner most evenings    Social History   Socioeconomic  History   Marital status: Married    Spouse name: Not on file   Number of children: 2   Years of education: Not on file   Highest education level: Bachelor's degree (e.g., BA, AB, BS)  Occupational History   Occupation: cpa/retired    Associate Professor: Retired  Tobacco Use   Smoking status: Never   Smokeless tobacco: Never  Vaping Use   Vaping Use: Never  used  Substance and Sexual Activity   Alcohol use: Yes    Alcohol/week: 7.0 standard drinks of alcohol    Types: 7 Glasses of wine per week    Comment: 1 glass wine with dinner most evenings   Drug use: No   Sexual activity: Yes    Birth control/protection: Post-menopausal  Other Topics Concern   Not on file  Social History Narrative   Married 54 years in 2015. 2 kids. 4 grandkids.       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 Previously [employer].    Social Determinants of Health   Financial Resource Strain: Not on file  Food Insecurity: Not on file  Transportation Needs: Not on file  Physical Activity: Not on file  Stress: Not on file  Social Connections: Not on file  Intimate Partner Violence: Not on file    Allergies  Allergen Reactions   Crab [Shellfish Allergy] Swelling    Soft shelled crab   Doxycycline Rash    Current Outpatient Medications  Medication Sig Dispense Refill   amoxicillin (AMOXIL) 500 MG capsule Take 2,000 mg by mouth See admin instructions. Take 2000 mg 1 hour prior to dental work     ascorbic acid (VITAMIN C) 1000 MG tablet Take 1,000 mg by mouth daily.     aspirin-acetaminophen-caffeine (EXCEDRIN MIGRAINE) 250-250-65 MG tablet Take 2 tablets by mouth every 6 (six) hours as needed for headache.      betamethasone, augmented, (DIPROLENE) 0.05 % lotion Apply 1 Application topically as needed (itchy ears).     Biotin 5000 MCG CAPS Take 5,000 mcg by mouth daily.     calcium carbonate (TUMS EX) 750 MG chewable tablet Chew 1 tablet by mouth daily as needed  for heartburn.     carboxymethylcellulose (REFRESH PLUS) 0.5 % SOLN Place 1 drop into both eyes 3 (three) times daily as needed (Dry eyes).     Cholecalciferol (VITAMIN D3) 50 MCG (2000 UT) TABS Take 2,000 Units by mouth every evening.     clobetasol (OLUX) 0.05 % topical foam Apply 1 Application topically once a week.     clobetasol (TEMOVATE) 0.05 % external solution Apply 1 Application topically daily as needed (itching).     cycloSPORINE (RESTASIS) 0.05 % ophthalmic emulsion Place 1 drop into both eyes 2 (two) times daily.      DULoxetine HCl 40 MG CPEP Take 1 capsule by mouth daily.     estradiol (VIVELLE-DOT) 0.1 MG/24HR patch Place 1 patch onto the skin 2 (two) times a week.      EVENING PRIMROSE OIL PO Take 1,300 mg by mouth 2 (two) times daily.     fexofenadine (ALLEGRA) 180 MG tablet Take 180 mg by mouth at bedtime.     fluocinonide (LIDEX) 0.05 % external solution Apply 1 application  topically once a week. Applied to scalp after shampooing     fluticasone (FLONASE) 50 MCG/ACT nasal spray SPRAY 2 SPRAYS INTO EACH NOSTRIL EVERY DAY (Patient taking differently: Place 1 spray into both nostrils daily.) 48 mL 1   Lutein 20 MG TABS Take 20 mg by mouth 2 (two) times daily.      Multiple Minerals-Vitamins (ADVANCED CALCIUM/D/MAGNESIUM) TABS Take 1 tablet by mouth 2 (two) times daily. includes boron and betaine     Multiple Vitamin (MULTIVITAMIN WITH MINERALS) TABS tablet Take 1 tablet by mouth daily.     Multiple Vitamins-Minerals (PRESERVISION AREDS 2 PO) Take 1  tablet by mouth 2 (two) times daily.     olopatadine (PATANOL) 0.1 % ophthalmic solution INSTILL 1 DROP INTO BOTH EYES TWICE A DAY (Patient taking differently: Place 1 drop into both eyes daily as needed for allergies.) 15 mL 3   Omega 3 1000 MG CAPS Take 1,000 mg by mouth 2 (two) times daily.     polyethylene glycol powder (GLYCOLAX/MIRALAX) powder Take 17 g by mouth daily.      pregabalin (LYRICA) 25 MG capsule Take 1 capsule (25 mg  total) by mouth 2 (two) times daily. TAKE 1 CAPSULE IN THE MORNING AND TAKE 1 CAPSULE IN THE EVENING (Patient taking differently: Take 25-50 mg by mouth See admin instructions. TAKE 1 CAPSULE IN THE MORNING AND TAKE 2 CAPSULES AT BEDTIME) 180 capsule 1   PRESCRIPTION MEDICATION Apply 1 Application topically at bedtime. Azelaic acid 15%, metronidazole 1%, ivermectin 1%     tiZANidine (ZANAFLEX) 2 MG tablet TAKE 1 TABLET (2 MG TOTAL) BY MOUTH AT BEDTIME AS NEEDED. (Patient taking differently: Take 2 mg by mouth at bedtime.) 90 tablet 1   Current Facility-Administered Medications  Medication Dose Route Frequency Provider Last Rate Last Admin   0.9 %  sodium chloride infusion  500 mL Intravenous Once Pyrtle, Carie Caddy, MD         Family History  Problem Relation Age of Onset   Alzheimer's disease Father    Heart disease Father        mitral valve replaced. unknown reason   Breast cancer Mother        39   Colon polyps Mother    Diabetes Mother    Heart disease Mother 36       CHF, MI 50   Heart attack Mother        x 2   Arthritis Mother    Cancer Mother    Hearing loss Mother    Hypertension Mother    Prostate cancer Brother    Cancer Brother    Hearing loss Brother    Hypertension Brother    Asthma Paternal Uncle    Hypertension Sister    Colon cancer Neg Hx    Esophageal cancer Neg Hx    Rectal cancer Neg Hx    Stomach cancer Neg Hx        Physical Exam: Healthy appearing woman in NAD Teeth in good repair Lungs: clear Card: RR with soft systolic murmur Ext: no edema     Diagnostic Studies & Laboratory data: I have personally reviewed the following studies and agree with the findings   TEE (01/2023) IMPRESSIONS     1. Left ventricular ejection fraction, by estimation, is 60 to 65%. The  left ventricle has normal function. The left ventricle has no regional  wall motion abnormalities.   2. Right ventricular systolic function is normal. The right ventricular  size  is normal.   3. Left atrial size was mildly dilated. No left atrial/left atrial  appendage thrombus was detected.   4. Markedly myxomatous mitral valve. Severe prolapse of P2, P3 segments ?  abnormal mobile papillary muscle. with current loading conditions MR  appeared moderate but pathology is severe. Flail gap 1.5 cm . The mitral  valve is myxomatous. Moderate mitral  valve regurgitation. No evidence of mitral stenosis. There is prolapse of  multiple scallops of the posterior leaflet of the mitral valve.   5. Markedly myxomatous TV with prolapse of septal and posterior leaflets  . The tricuspid valve is  myxomatous. Tricuspid valve regurgitation is  severe.   6. The aortic valve is tricuspid. Aortic valve regurgitation is not  visualized. No aortic stenosis is present.   7. The inferior vena cava is normal in size with greater than 50%  respiratory variability, suggesting right atrial pressure of 3 mmHg.   Conclusion(s)/Recommendation(s): Patient has surgical consultation with Dr  Leafy Ro next week.   FINDINGS   Left Ventricle: Left ventricular ejection fraction, by estimation, is 60  to 65%. The left ventricle has normal function. The left ventricle has no  regional wall motion abnormalities. The left ventricular internal cavity  size was normal in size. There is   no left ventricular hypertrophy.   Right Ventricle: The right ventricular size is normal. No increase in  right ventricular wall thickness. Right ventricular systolic function is  normal.   Left Atrium: Left atrial size was mildly dilated. No left atrial/left  atrial appendage thrombus was detected.   Right Atrium: Right atrial size was normal in size.   Pericardium: There is no evidence of pericardial effusion.   Mitral Valve: Markedly myxomatous mitral valve. Severe prolapse of P2, P3  segments ? abnormal mobile papillary muscle. with current loading  conditions MR appeared moderate but pathology is severe.  Flail gap 1.5 cm.  The mitral valve is myxomatous. There  is prolapse of multiple scallops of the posterior leaflet of the mitral  valve. Moderate mitral valve regurgitation. No evidence of mitral valve  stenosis. MV peak gradient, 4.0 mmHg. The mean mitral valve gradient is  2.0 mmHg.   Tricuspid Valve: Markedly myxomatous TV with prolapse of septal and  posterior leaflets. The tricuspid valve is myxomatous. Tricuspid valve  regurgitation is severe. No evidence of tricuspid stenosis. There is  severe prolapse of the tricuspid.   Aortic Valve: The aortic valve is tricuspid. Aortic valve regurgitation is  not visualized. No aortic stenosis is present.   Pulmonic Valve: The pulmonic valve was normal in structure. Pulmonic valve  regurgitation is mild. No evidence of pulmonic stenosis.   Aorta: The aortic root is normal in size and structure.   Venous: The inferior vena cava is normal in size with greater than 50%  respiratory variability, suggesting right atrial pressure of 3 mmHg.   IAS/Shunts: No atrial level shunt detected by color flow Doppler.   Additional Comments: Spectral Doppler performed.   MITRAL VALVE  MV Peak grad: 4.0 mmHg  MV Mean grad: 2.0 mmHg  MV Vmax:      1.00 m/s  MV Vmean:     66.2 cm/s  MR Peak grad:    73.3 mmHg  MR Mean grad:    56.0 mmHg  MR Vmax:         428.00 cm/s  MR Vmean:        361.0 cm/s  MR PISA:         2.26 cm  MR PISA Eff ROA: 18 mm  MR PISA Radius:  0.60 cm   Charlton Haws MD  Electronically signed by Charlton Haws MD  Signature Date/Time: 02/13/2023/12:36:43 PM     CATH (01/2023) Conclusion      The left ventricular systolic function is normal.   LV end diastolic pressure is normal.   The left ventricular ejection fraction is greater than 65% by visual estimate.   There is moderate (3+) mitral regurgitation.   Angiographically normal coronary arteries, right-dominant Normal LV systolic function with LVEF estimated at 65% and no  regional wall motion abnormalities Moderate  MR by angiographic assessment Normal right and left heart intracardiac pressures: RA 4 RV 25/5 PA 26/7 mean 16 PCWP 6 LV 134/17 Ao 135/69    Recent Radiology Findings:       Recent Lab Findings: Lab Results  Component Value Date   WBC 6.2 05/02/2019   HGB 12.6 02/13/2023   HCT 37.0 02/13/2023   PLT 123 (L) 05/02/2019   GLUCOSE 89 05/02/2019   CHOL 180 06/04/2016   TRIG 47.0 06/04/2016   HDL 90.00 06/04/2016   LDLCALC 80 06/04/2016   ALT 17 04/27/2019   AST 18 04/27/2019   NA 134 (L) 02/13/2023   K 3.6 02/13/2023   CL 103 05/02/2019   CREATININE 0.62 05/02/2019   BUN 10 05/02/2019   CO2 26 05/02/2019   TSH 2.65 10/02/2017   INR 1.0 04/27/2019   HGBA1C 5.0 04/27/2019      Assessment / Plan:     82 yo female with NYHA class I symptoms of moderate MR and 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 We discussed with pt's husband and son's via phone the issues of surgical intervention for mildly symptomatic Moderate MR with the certainty of MV replacement and maybe not a staight forward TV repair.  I feel that with symptoms at present with hardly no medical management in place that surviellance would be best and if worsening symptoms, or echo findings of LV dysfunction, PHTN, or afib, that she would be a surgical candidate at that time to accept the risks associated with surgery. Pt and family agree.   I have spent 60 min in review of the records, viewing studies and in face to face with patient and in coordination of future care    Eugenio Hoes 02/17/2023 8:44 AM

## 2023-03-29 ENCOUNTER — Other Ambulatory Visit: Payer: Self-pay

## 2023-03-29 ENCOUNTER — Emergency Department (HOSPITAL_BASED_OUTPATIENT_CLINIC_OR_DEPARTMENT_OTHER)
Admission: EM | Admit: 2023-03-29 | Discharge: 2023-03-29 | Disposition: A | Discharge status: Home / Self Care | Payer: Medicare Other | Attending: Emergency Medicine | Admitting: Emergency Medicine

## 2023-03-29 DIAGNOSIS — L821 Other seborrheic keratosis: Secondary | ICD-10-CM | POA: Diagnosis not present

## 2023-03-29 DIAGNOSIS — M542 Cervicalgia: Secondary | ICD-10-CM | POA: Diagnosis present

## 2023-03-29 NOTE — Discharge Instructions (Signed)
Please keep the area, wash with warm water and mild soap to 3 times a day to prevent infection.  May cover with antibiotic ointment and/or bandage if desired.  Return with worsening redness, pain, swelling, pus draining from the wound, or other concerns.

## 2023-03-29 NOTE — ED Provider Notes (Signed)
[place] [facility] AT [facility] Provider Note   CSN: [id number] Arrival date & time: 03/29/23  4098     History  Chief Complaint  Patient presents with   Neck Pain    Tanya Leon is a 82 y.o. female.  Patient presents to the emergency department for evaluation of left sided neck lesion.  Patient states that she was at the hairdresser yesterday.  Hairdresser was shaving on her neck and was told that she may have "nicked" her.  Patient noted a raised area and purple color around the lesion this morning.  She does not remember there being any skin lesions in this area in the past.  She was concerned that it might have been a tick.  Minimal soreness noted.  No fevers.  Denies anticoagulation.       Home Medications Prior to Admission medications   Medication Sig Start Date End Date Taking? Authorizing Provider  amoxicillin (AMOXIL) 500 MG capsule Take 2,000 mg by mouth See admin instructions. Take 2000 mg 1 hour prior to dental work 12/21/19   [provider]  ascorbic acid (VITAMIN C) 1000 MG tablet Take 1,000 mg by mouth daily. 12/11/11   [provider]  aspirin-acetaminophen-caffeine (EXCEDRIN MIGRAINE) [phone] MG tablet Take 2 tablets by mouth every 6 (six) hours as needed for headache.     [provider]  betamethasone, augmented, (DIPROLENE) 0.05 % lotion Apply 1 Application topically as needed (itchy ears). 11/29/22   [provider]  Biotin 5000 MCG CAPS Take 5,000 mcg by mouth daily.    [provider]  calcium carbonate (TUMS EX) 750 MG chewable tablet Chew 1 tablet by mouth daily as needed for heartburn.    [provider]  carboxymethylcellulose (REFRESH PLUS) 0.5 % SOLN Place 1 drop into both eyes 3 (three) times daily as needed (Dry eyes).    [provider]  Cholecalciferol (VITAMIN D3) 50 MCG (2000 UT) TABS Take 2,000 Units by mouth every evening.    [provider]   clobetasol (OLUX) 0.05 % topical foam Apply 1 Application topically once a week.    [provider]  clobetasol (TEMOVATE) 0.05 % external solution Apply 1 Application topically daily as needed (itching).    [provider]  cycloSPORINE (RESTASIS) 0.05 % ophthalmic emulsion Place 1 drop into both eyes 2 (two) times daily.     [provider]  DULoxetine HCl 40 MG CPEP Take 1 capsule by mouth daily. 07/11/20   [provider]  estradiol (VIVELLE-DOT) 0.1 MG/24HR patch Place 1 patch onto the skin 2 (two) times a week.     [provider]  EVENING PRIMROSE OIL PO Take 1,300 mg by mouth 2 (two) times daily.    [provider]  fexofenadine (ALLEGRA) 180 MG tablet Take 180 mg by mouth at bedtime.    [provider]  fluocinonide (LIDEX) 0.05 % external solution Apply 1 application  topically once a week. Applied to scalp after shampooing    [provider]  fluticasone (FLONASE) 50 MCG/ACT nasal spray SPRAY 2 SPRAYS INTO EACH NOSTRIL EVERY DAY Patient taking differently: Place 1 spray into both nostrils daily. 05/18/20   Shelva Majestic, MD  Lutein 20 MG TABS Take 20 mg by mouth 2 (two) times daily.     [provider]  Multiple Minerals-Vitamins (ADVANCED CALCIUM/D/MAGNESIUM) TABS Take 1 tablet by mouth 2 (two) times daily. includes boron and betaine    [provider]  Multiple Vitamin (MULTIVITAMIN WITH MINERALS) TABS tablet Take 1 tablet by mouth daily.    [provider]  Multiple Vitamins-Minerals (PRESERVISION AREDS 2 PO) Take 1 tablet by mouth 2 (two) times daily.    [provider]  olopatadine (PATANOL) 0.1 % ophthalmic solution INSTILL 1 DROP INTO BOTH EYES TWICE A DAY Patient taking differently: Place 1 drop into both eyes daily as needed for allergies. 09/18/20   Shelva Majestic, MD  Omega 3 1000 MG CAPS Take 1,000 mg by mouth 2 (two) times daily.    [provider]   polyethylene glycol powder (GLYCOLAX/MIRALAX) powder Take 17 g by mouth daily.     [provider]  pregabalin (LYRICA) 25 MG capsule Take 1 capsule (25 mg total) by mouth 2 (two) times daily. TAKE 1 CAPSULE IN THE MORNING AND TAKE 1 CAPSULE IN THE EVENING Patient taking differently: Take 25-50 mg by mouth See admin instructions. TAKE 1 CAPSULE IN THE MORNING AND TAKE 2 CAPSULES AT BEDTIME 01/26/19   Shelva Majestic, MD  PRESCRIPTION MEDICATION Apply 1 Application topically at bedtime. Azelaic acid 15%, metronidazole 1%, ivermectin 1%    [provider]  tiZANidine (ZANAFLEX) 2 MG tablet TAKE 1 TABLET (2 MG TOTAL) BY MOUTH AT BEDTIME AS NEEDED. Patient taking differently: Take 2 mg by mouth at bedtime. 05/18/20   Shelva Majestic, MD      Allergies    Parke Simmers allergy] and Doxycycline    Review of Systems   Review of Systems  Physical Exam Updated Vital Signs BP 131/88 (BP Location: Right Arm)   Pulse 62   Temp 98 F (36.7 C) (Oral)   Resp 18   Wt 64 kg   SpO2 97%   BMI 26.64 kg/m  Physical Exam Vitals and nursing note reviewed.  Constitutional:      Appearance: She is well-developed.  HENT:     Head: Normocephalic and atraumatic.  Eyes:     Conjunctiva/sclera: Conjunctivae normal.  Neck:     Comments: On the left lateral neck there is a small raised seborrheic keratosis with surrounding ecchymosis consistent with a small bruise.  No tics noted.  There was a little dried blood on the area consistent with trauma.  No sign of abscess or purulent drainage. Pulmonary:     Effort: No respiratory distress.  Musculoskeletal:     Cervical back: Normal range of motion and neck supple.  Skin:    General: Skin is warm and dry.  Neurological:     Mental Status: She is alert.     ED Results / Procedures / Treatments   Labs (all labs ordered are listed, but only abnormal results are displayed) Labs Reviewed - No data to  display  EKG None  Radiology No results found.  Procedures Procedures    Medications Ordered in ED Medications - No data to display  ED Course/ Medical Decision Making/ A&P    Patient seen and examined. History obtained directly from patient and husband at bedside.  Labs/EKG: None ordered Imaging: None ordered  Medications/Fluids: None ordered   Most recent vital signs reviewed and are as follows: BP 131/88 (BP Location: Right Arm)   Pulse 62   Temp 98 F (36.7 C) (Oral)   Resp 18   Wt 64 kg   SpO2 97%   BMI 26.64 kg/m   Initial impression: Trauma to a slightly raised seborrheic keratosis  Home treatment plan: Wound care, monitoring  Return instructions  discussed with patient: Worsening pain, redness, swelling, purulent drainage  Follow-up instructions discussed with patient: PCP as needed                            Medical Decision Making  Patient with apparently small traumatized seborrheic keratosis, likely minimal amount of bleeding, sustained at her hairdresser appointment yesterday.  No tachycardia noted.  No signs of cellulitis.  No significant hematoma suspected.  I suspect that with good wound care this area will resolve over the next week or so.  Patient and husband in agreement and comfortable with plan.         Final Clinical Impression(s) / ED Diagnoses Final diagnoses:  Traumatized seborrheic keratosis    Rx / DC Orders ED Discharge Orders     None         Renne Crigler, PA-C 03/29/23 1218    Horton, Clabe Seal, DO 03/30/23 [phone]

## 2023-03-29 NOTE — ED Triage Notes (Signed)
Pt c/o neck pain and a spot on her neck that they just noticed yesterday. Pt states it sore but does not hurt.

## 2023-03-31 ENCOUNTER — Telehealth: Payer: Self-pay | Admitting: Physician Assistant

## 2023-03-31 NOTE — Telephone Encounter (Signed)
[facility] cardiology called from [place]  saying the patient is requesting a referral there. I faxed over a face sheet and last OV per referral coordinator's request.  Cline Crock PA-C  [facility]

## 2023-04-02 ENCOUNTER — Encounter: Payer: Self-pay | Admitting: Cardiovascular Disease

## 2023-04-02 ENCOUNTER — Ambulatory Visit: Payer: Medicare Other | Attending: Cardiovascular Disease | Admitting: Cardiovascular Disease

## 2023-04-02 VITALS — BP 110/78 | HR 53 | Ht 61.0 in | Wt 147.4 lb

## 2023-04-02 DIAGNOSIS — I272 Pulmonary hypertension, unspecified: Secondary | ICD-10-CM | POA: Diagnosis not present

## 2023-04-02 DIAGNOSIS — I340 Nonrheumatic mitral (valve) insufficiency: Secondary | ICD-10-CM | POA: Insufficient documentation

## 2023-04-02 NOTE — Patient Instructions (Addendum)
Medication Instructions:  Your physician recommends that you continue on your current medications as directed. Please refer to the Current Medication list given to you today.  *If you need a refill on your cardiac medications before your next appointment, please call your pharmacy*   Testing/Procedures: Your physician has requested that you have an echocardiogram. Echocardiography is a painless test that uses sound waves to create images of your heart. It provides your doctor with information about the size and shape of your heart and how well your heart's chambers and valves are working. This procedure takes approximately one hour. There are no restrictions for this procedure. Please do NOT wear cologne, perfume, aftershave, or lotions (deodorant is allowed). Please arrive 15 minutes prior to your appointment time. This will take place at [address] [place] **To do in May 2025**    Follow-Up: At [facility], you and your health needs are our priority.  As part of our continuing mission to provide you with exceptional heart care, we have created designated Provider Care Teams.  These Care Teams include your primary Cardiologist (physician) and Advanced Practice Providers (APPs -  Physician Assistants and Nurse Practitioners) who all work together to provide you with the care you need, when you need it.  We recommend signing up for the patient portal called "MyChart".  Sign up information is provided on this After Visit Summary.  MyChart is used to connect with patients for Virtual Visits (Telemedicine).  Patients are able to view lab/test results, encounter notes, upcoming appointments, etc.  Non-urgent messages can be sent to your provider as well.   To learn more about what you can do with MyChart, go to [email or web address].    Your next appointment:   6 month(s)  Provider:   Nanetta Batty, MD

## 2023-04-02 NOTE — Assessment & Plan Note (Signed)
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  She has normal LV function by TEE performed 02/13/2023.  She is completely asymptomatic.  I did refer her to Dr. Excell Seltzer who did right left heart cath.  She has clean coronary arteries with normal filling pressures pressures and normal pulmonary artery pressures as well as wedge pressure.  She is completely asymptomatic.  She apparently is not a candidate for MitraClip and was sent to Dr. Leafy Ro who felt that conservative watchful waiting would be most appropriate.  She apparently is not a candidate for mitral valve repair but rather replacement.  Her LV size and function are normal.  Will continue to follow her noninvasively on annual basis.

## 2023-04-02 NOTE — Progress Notes (Signed)
04/02/2023 Tanya Leon   1941-06-01  [id number]  Primary Physician [name], MD Primary Cardiologist: Runell Gess MD Nicholes Calamity, [place]  HPI:  Tanya Leon is a 82 y.o.   thin-appearing married Caucasian female mother of 2, grandmother, 4 grandchildren was accompanied who is husband Tanya Leon is a patient of mine as well.  I last saw her in the office 10/11/2022.Marland Kitchen She was she has no cardiac risk factors other than her mother died of a myocardial infarction at age 30. She has never had a heart attack or stroke. She denies chest pain or shortness of breath. She has noticed palpitations over the last month with some occasional dizziness. EKG performed by her PCP revealed PVCs as did her EKG today. She was drinking 2 cups of coffee a day which she has changed to decaf. Thyroid function tests were normal.      Because of palpitations she did have a monitor which failed to show PVCs.  She says that her palpitations have resolved since I last saw her.  She had a 2D echo performed 07/27/2019 showed normal LV size and function, mild bileaflet prolapse with mild to moderate MR, and diastolic dysfunction with a RV systolic blood pressure wound which was mildly elevated at 35 mmHg.  She does exercise, walks up 3 flights of stairs at [facility] which is where she and her husband live does complain of some mild shortness of breath with with moderate exertion.    She apparently injured her ankle and no longer is playing pickle ball.   Since I saw her a year ago she has been diagnosed with severe obstructive sleep apnea and has been treated with BiPAP.  She feels clinically improved.  A  2D echo performed 08/02/2020 revealed normal LV systolic function, grade 1 diastolic dysfunction with only mild MR and posterior leaflet prolapse.  Her RV systolic pressure was 37 mmHg.       She is very active and is completely asymptomatic denying chest pain or shortness of breath.  She did have a 2D echo  performed 01/03/22 revealing normal LV size and function with severe mitral regurgitation and posterior leaflet prolapse with mild to moderate left atrial dilatation and stable mild pulmonary hypertension with an RV systolic pressure of 35 mmHg.  As result of this I referred her to Dr. Excell Seltzer for consideration of MitraClip being.  She had a right left heart cath that showed clean coronary arteries without evidence of pulm hypertension and normal filling pressures.  TEE performed Dr. Eden Emms revealed severe MR and TR with prolapse of the P2 and P3 leaflets as well as prolapse of the tricuspid leaflets.  Her LV size and function were normal.  She was then sent to Dr. Leafy Ro who felt that she was not a mitral valve repair candidate but rather atrial valve replacement and tricuspid valve repair.  She is very active and is relatively asymptomatic.  The consensus was watchful waiting and conservative therapy at this time.   Current Meds  Medication Sig   amoxicillin (AMOXIL) 500 MG capsule Take 2,000 mg by mouth See admin instructions. Take 2000 mg 1 hour prior to dental work   ascorbic acid (VITAMIN C) 1000 MG tablet Take 1,000 mg by mouth daily.   aspirin-acetaminophen-caffeine (EXCEDRIN MIGRAINE) 250-250-65 MG tablet Take 2 tablets by mouth every 6 (six) hours as needed for headache.    betamethasone, augmented, (DIPROLENE) 0.05 % lotion Apply 1 Application topically as  needed (itchy ears).   Biotin 5000 MCG CAPS Take 5,000 mcg by mouth daily.   calcium carbonate (TUMS EX) 750 MG chewable tablet Chew 1 tablet by mouth daily as needed for heartburn.   carboxymethylcellulose (REFRESH PLUS) 0.5 % SOLN Place 1 drop into both eyes 3 (three) times daily as needed (Dry eyes).   Cholecalciferol (VITAMIN D3) 50 MCG (2000 UT) TABS Take 2,000 Units by mouth every evening.   clobetasol (OLUX) 0.05 % topical foam Apply 1 Application topically once a week.   clobetasol (TEMOVATE) 0.05 % external solution Apply 1  Application topically daily as needed (itching).   cycloSPORINE (RESTASIS) 0.05 % ophthalmic emulsion Place 1 drop into both eyes 2 (two) times daily.    DULoxetine HCl 40 MG CPEP Take 1 capsule by mouth daily.   estradiol (VIVELLE-DOT) 0.1 MG/24HR patch Place 1 patch onto the skin 2 (two) times a week.    EVENING PRIMROSE OIL PO Take 1,300 mg by mouth 2 (two) times daily.   fexofenadine (ALLEGRA) 180 MG tablet Take 180 mg by mouth at bedtime.   fluocinonide (LIDEX) 0.05 % external solution Apply 1 application  topically once a week. Applied to scalp after shampooing   fluticasone (FLONASE) 50 MCG/ACT nasal spray SPRAY 2 SPRAYS INTO EACH NOSTRIL EVERY DAY (Patient taking differently: Place 1 spray into both nostrils daily.)   Lutein 20 MG TABS Take 20 mg by mouth 2 (two) times daily.    Multiple Minerals-Vitamins (ADVANCED CALCIUM/D/MAGNESIUM) TABS Take 1 tablet by mouth 2 (two) times daily. includes boron and betaine   Multiple Vitamin (MULTIVITAMIN WITH MINERALS) TABS tablet Take 1 tablet by mouth daily.   Multiple Vitamins-Minerals (PRESERVISION AREDS 2 PO) Take 1 tablet by mouth 2 (two) times daily.   olopatadine (PATANOL) 0.1 % ophthalmic solution INSTILL 1 DROP INTO BOTH EYES TWICE A DAY (Patient taking differently: Place 1 drop into both eyes daily as needed for allergies.)   Omega 3 1000 MG CAPS Take 1,000 mg by mouth 2 (two) times daily.   polyethylene glycol powder (GLYCOLAX/MIRALAX) powder Take 17 g by mouth daily.    pregabalin (LYRICA) 25 MG capsule Take 1 capsule (25 mg total) by mouth 2 (two) times daily. TAKE 1 CAPSULE IN THE MORNING AND TAKE 1 CAPSULE IN THE EVENING (Patient taking differently: Take 25-50 mg by mouth See admin instructions. TAKE 1 CAPSULE IN THE MORNING AND TAKE 2 CAPSULES AT BEDTIME)   PRESCRIPTION MEDICATION Apply 1 Application topically at bedtime. Azelaic acid 15%, metronidazole 1%, ivermectin 1%   tiZANidine (ZANAFLEX) 2 MG tablet TAKE 1 TABLET (2 MG TOTAL) BY  MOUTH AT BEDTIME AS NEEDED. (Patient taking differently: Take 2 mg by mouth at bedtime.)   Current Facility-Administered Medications for the 04/02/23 encounter (Office Visit) with Runell Gess, MD  Medication   0.9 %  sodium chloride infusion     Allergies  Allergen Reactions   Crab [Shellfish Allergy] Swelling    Soft shelled crab   Doxycycline Rash    Social History   Socioeconomic History   Marital status: Married    Spouse name: Not on file   Number of children: 2   Years of education: Not on file   Highest education level: Bachelor's degree (e.g., BA, AB, BS)  Occupational History   Occupation: cpa/retired    Associate Professor: Retired  Tobacco Use   Smoking status: Never   Smokeless tobacco: Never  Vaping Use   Vaping Use: Never used  Substance and Sexual Activity  Alcohol use: Yes    Alcohol/week: 7.0 standard drinks of alcohol    Types: 7 Glasses of wine per week    Comment: 1 glass wine with dinner most evenings   Drug use: No   Sexual activity: Yes    Birth control/protection: Post-menopausal  Other Topics Concern   Not on file  Social History Narrative   Married 54 years in 2015. 2 kids. 4 grandkids.       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 Previously [employer].    Social Determinants of Health   Financial Resource Strain: Not on file  Food Insecurity: Not on file  Transportation Needs: Not on file  Physical Activity: Not on file  Stress: Not on file  Social Connections: Not on file  Intimate Partner Violence: Not on file     Review of Systems: General: negative for chills, fever, night sweats or weight changes.  Cardiovascular: negative for chest pain, dyspnea on exertion, edema, orthopnea, palpitations, paroxysmal nocturnal dyspnea or shortness of breath Dermatological: negative for rash Respiratory: negative for cough or wheezing Urologic: negative for hematuria Abdominal: negative for nausea,  vomiting, diarrhea, bright red blood per rectum, melena, or hematemesis Neurologic: negative for visual changes, syncope, or dizziness All other systems reviewed and are otherwise negative except as noted above.    Blood pressure 110/78, pulse (!) 53, height 5\' 1"  (1.549 m), weight 147 lb 6.4 oz (66.9 kg), SpO2 97 %.  General appearance: alert and no distress Neck: no adenopathy, no carotid bruit, no JVD, supple, symmetrical, trachea midline, and thyroid not enlarged, symmetric, no tenderness/mass/nodules Lungs: clear to auscultation bilaterally Heart: regular rate and rhythm, S1, S2 normal, no murmur, click, rub or gallop Extremities: extremities normal, atraumatic, no cyanosis or edema Pulses: 2+ and symmetric Skin: Skin color, texture, turgor normal. 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  She has normal LV function by TEE performed 02/13/2023.  She is completely asymptomatic.  I did refer her to Dr. Excell Seltzer who did right left heart cath.  She has clean coronary arteries with normal filling pressures pressures and normal pulmonary artery pressures as well as wedge pressure.  She is completely asymptomatic.  She apparently is not a candidate for MitraClip and was sent to Dr. Leafy Ro who felt that conservative watchful waiting would be most appropriate.  She apparently is not a candidate for mitral valve repair but rather replacement.  Her LV size and function are normal.  Will continue to follow her noninvasively on annual basis.     Runell Gess MD FACP,FACC,FAHA, [facility] 04/02/2023 9:57 AM

## 2023-05-19 ENCOUNTER — Encounter (INDEPENDENT_AMBULATORY_CARE_PROVIDER_SITE_OTHER): Payer: Medicare Other | Admitting: Ophthalmology

## 2023-05-20 ENCOUNTER — Encounter (INDEPENDENT_AMBULATORY_CARE_PROVIDER_SITE_OTHER): Payer: Medicare Other | Admitting: Ophthalmology

## 2023-07-17 ENCOUNTER — Inpatient Hospital Stay (HOSPITAL_COMMUNITY)
Admission: EM | Admit: 2023-07-17 | Discharge: 2023-07-21 | DRG: 200 | Disposition: A | Discharge status: Home Health | Payer: Medicare Other | Attending: Internal Medicine | Admitting: Internal Medicine

## 2023-07-17 ENCOUNTER — Emergency Department (HOSPITAL_COMMUNITY): Payer: Medicare Other

## 2023-07-17 ENCOUNTER — Other Ambulatory Visit: Payer: Self-pay

## 2023-07-17 ENCOUNTER — Encounter (HOSPITAL_COMMUNITY): Payer: Self-pay

## 2023-07-17 DIAGNOSIS — I4891 Unspecified atrial fibrillation: Principal | ICD-10-CM | POA: Diagnosis present

## 2023-07-17 DIAGNOSIS — I10 Essential (primary) hypertension: Secondary | ICD-10-CM | POA: Diagnosis present

## 2023-07-17 DIAGNOSIS — Z981 Arthrodesis status: Secondary | ICD-10-CM

## 2023-07-17 DIAGNOSIS — Z79818 Long term (current) use of other agents affecting estrogen receptors and estrogen levels: Secondary | ICD-10-CM

## 2023-07-17 DIAGNOSIS — I081 Rheumatic disorders of both mitral and tricuspid valves: Secondary | ICD-10-CM | POA: Diagnosis present

## 2023-07-17 DIAGNOSIS — M62838 Other muscle spasm: Secondary | ICD-10-CM | POA: Diagnosis present

## 2023-07-17 DIAGNOSIS — Z85828 Personal history of other malignant neoplasm of skin: Secondary | ICD-10-CM

## 2023-07-17 DIAGNOSIS — I272 Pulmonary hypertension, unspecified: Secondary | ICD-10-CM | POA: Diagnosis present

## 2023-07-17 DIAGNOSIS — D696 Thrombocytopenia, unspecified: Secondary | ICD-10-CM | POA: Diagnosis present

## 2023-07-17 DIAGNOSIS — Z79899 Other long term (current) drug therapy: Secondary | ICD-10-CM

## 2023-07-17 DIAGNOSIS — N951 Menopausal and female climacteric states: Secondary | ICD-10-CM | POA: Diagnosis present

## 2023-07-17 DIAGNOSIS — Z803 Family history of malignant neoplasm of breast: Secondary | ICD-10-CM

## 2023-07-17 DIAGNOSIS — Z881 Allergy status to other antibiotic agents status: Secondary | ICD-10-CM

## 2023-07-17 DIAGNOSIS — Z8261 Family history of arthritis: Secondary | ICD-10-CM

## 2023-07-17 DIAGNOSIS — Z8249 Family history of ischemic heart disease and other diseases of the circulatory system: Secondary | ICD-10-CM | POA: Diagnosis not present

## 2023-07-17 DIAGNOSIS — J9383 Other pneumothorax: Principal | ICD-10-CM | POA: Diagnosis present

## 2023-07-17 DIAGNOSIS — J9311 Primary spontaneous pneumothorax: Secondary | ICD-10-CM | POA: Diagnosis not present

## 2023-07-17 DIAGNOSIS — Z820 Family history of epilepsy and other diseases of the nervous system: Secondary | ICD-10-CM

## 2023-07-17 DIAGNOSIS — G43909 Migraine, unspecified, not intractable, without status migrainosus: Secondary | ICD-10-CM | POA: Diagnosis present

## 2023-07-17 DIAGNOSIS — Z833 Family history of diabetes mellitus: Secondary | ICD-10-CM

## 2023-07-17 DIAGNOSIS — I4892 Unspecified atrial flutter: Secondary | ICD-10-CM | POA: Diagnosis present

## 2023-07-17 DIAGNOSIS — F32A Depression, unspecified: Secondary | ICD-10-CM | POA: Diagnosis present

## 2023-07-17 DIAGNOSIS — H409 Unspecified glaucoma: Secondary | ICD-10-CM | POA: Diagnosis present

## 2023-07-17 DIAGNOSIS — Z96641 Presence of right artificial hip joint: Secondary | ICD-10-CM | POA: Diagnosis present

## 2023-07-17 DIAGNOSIS — J939 Pneumothorax, unspecified: Secondary | ICD-10-CM | POA: Diagnosis present

## 2023-07-17 DIAGNOSIS — Z83719 Family history of colon polyps, unspecified: Secondary | ICD-10-CM

## 2023-07-17 DIAGNOSIS — Z809 Family history of malignant neoplasm, unspecified: Secondary | ICD-10-CM

## 2023-07-17 DIAGNOSIS — G4733 Obstructive sleep apnea (adult) (pediatric): Secondary | ICD-10-CM | POA: Diagnosis present

## 2023-07-17 DIAGNOSIS — Z91013 Allergy to seafood: Secondary | ICD-10-CM | POA: Diagnosis not present

## 2023-07-17 DIAGNOSIS — Z822 Family history of deafness and hearing loss: Secondary | ICD-10-CM

## 2023-07-17 DIAGNOSIS — Z825 Family history of asthma and other chronic lower respiratory diseases: Secondary | ICD-10-CM

## 2023-07-17 DIAGNOSIS — Z96653 Presence of artificial knee joint, bilateral: Secondary | ICD-10-CM | POA: Diagnosis present

## 2023-07-17 LAB — COMPREHENSIVE METABOLIC PANEL
ALT: 21 U/L (ref 0–44)
AST: 30 U/L (ref 15–41)
Albumin: 3.7 g/dL (ref 3.5–5.0)
Alkaline Phosphatase: 48 U/L (ref 38–126)
Anion gap: 8 (ref 5–15)
BUN: 16 mg/dL (ref 8–23)
CO2: 22 mmol/L (ref 22–32)
Calcium: 8.6 mg/dL — ABNORMAL LOW (ref 8.9–10.3)
Chloride: 106 mmol/L (ref 98–111)
Creatinine, Ser: 0.77 mg/dL (ref 0.44–1.00)
GFR, Estimated: >60 mL/min (ref >60)
Glucose, Bld: 98 mg/dL (ref 70–99)
Potassium: 4.4 mmol/L (ref 3.5–5.1)
Sodium: 136 mmol/L (ref 135–145)
Total Bilirubin: 1 mg/dL (ref 0.3–1.2)
Total Protein: 6.1 g/dL — ABNORMAL LOW (ref 6.5–8.1)

## 2023-07-17 LAB — I-STAT CHEM 8, ED
BUN: 21 mg/dL (ref 8–23)
Calcium, Ion: 1.1 mmol/L — ABNORMAL LOW (ref 1.15–1.40)
Chloride: 105 mmol/L (ref 98–111)
Creatinine, Ser: 0.8 mg/dL (ref 0.44–1.00)
Glucose, Bld: 97 mg/dL (ref 70–99)
HCT: 42 % (ref 36.0–46.0)
Hemoglobin: 14.3 g/dL (ref 12.0–15.0)
Potassium: 4.4 mmol/L (ref 3.5–5.1)
Sodium: 139 mmol/L (ref 135–145)
TCO2: 24 mmol/L (ref 22–32)

## 2023-07-17 LAB — CBC WITH DIFFERENTIAL/PLATELET
Abs Immature Granulocytes: 0.01 10*3/uL (ref 0.00–0.07)
Basophils Absolute: 0 10*3/uL (ref 0.0–0.1)
Basophils Relative: 1 %
Eosinophils Absolute: 0.1 10*3/uL (ref 0.0–0.5)
Eosinophils Relative: 2 %
HCT: 42.3 % (ref 36.0–46.0)
Hemoglobin: 13.7 g/dL (ref 12.0–15.0)
Immature Granulocytes: 0 %
Lymphocytes Relative: 20 %
Lymphs Abs: 1.1 10*3/uL (ref 0.7–4.0)
MCH: 30 pg (ref 26.0–34.0)
MCHC: 32.4 g/dL (ref 30.0–36.0)
MCV: 92.8 fL (ref 80.0–100.0)
Monocytes Absolute: 0.4 10*3/uL (ref 0.1–1.0)
Monocytes Relative: 7 %
Neutro Abs: 3.8 10*3/uL (ref 1.7–7.7)
Neutrophils Relative %: 70 %
Platelets: 155 10*3/uL (ref 150–400)
RBC: 4.56 MIL/uL (ref 3.87–5.11)
RDW: 13.7 % (ref 11.5–15.5)
WBC: 5.5 10*3/uL (ref 4.0–10.5)
nRBC: 0 % (ref 0.0–0.2)

## 2023-07-17 LAB — TSH: TSH: 2.024 u[IU]/mL (ref 0.350–4.500)

## 2023-07-17 LAB — MAGNESIUM: Magnesium: 2.1 mg/dL (ref 1.7–2.4)

## 2023-07-17 MED ORDER — LORATADINE 10 MG PO TABS
10.0000 mg | ORAL_TABLET | Freq: Every day | ORAL | Status: DC
  Administered 2023-07-17 – 2023-07-21 (×5): 10 mg via ORAL
  Filled 2023-07-17 (×5): qty 1

## 2023-07-17 MED ORDER — ESTRADIOL 0.1 MG/24HR TD PTWK: 0.1000 mg | MEDICATED_PATCH | TRANSDERMAL | Status: DC

## 2023-07-17 MED ORDER — CYCLOSPORINE 0.05 % OP EMUL
1.0000 [drp] | Freq: Two times a day (BID) | OPHTHALMIC | Status: DC
  Administered 2023-07-17 – 2023-07-21 (×8): 1 [drp] via OPHTHALMIC
  Filled 2023-07-17 (×10): qty 30

## 2023-07-17 MED ORDER — SODIUM CHLORIDE 0.9% FLUSH
3.0000 mL | INTRAVENOUS | Status: DC | PRN
  Administered 2023-07-17: 3 mL via INTRAVENOUS

## 2023-07-17 MED ORDER — PREGABALIN 25 MG PO CAPS
25.0000 mg | ORAL_CAPSULE | Freq: Two times a day (BID) | ORAL | Status: DC
  Administered 2023-07-17 – 2023-07-21 (×8): 25 mg via ORAL
  Filled 2023-07-17 (×8): qty 1

## 2023-07-17 MED ORDER — ACETAMINOPHEN 325 MG PO TABS
650.0000 mg | ORAL_TABLET | Freq: Four times a day (QID) | ORAL | Status: DC | PRN
  Administered 2023-07-19: 650 mg via ORAL
  Filled 2023-07-17: qty 2

## 2023-07-17 MED ORDER — ASPIRIN-ACETAMINOPHEN-CAFFEINE 250-250-65 MG PO TABS: 2.0000 | ORAL_TABLET | Freq: Four times a day (QID) | ORAL | Status: DC | PRN

## 2023-07-17 MED ORDER — TIZANIDINE HCL 4 MG PO TABS
2.0000 mg | ORAL_TABLET | Freq: Every day | ORAL | Status: DC
  Administered 2023-07-17 – 2023-07-20 (×4): 2 mg via ORAL
  Filled 2023-07-17 (×4): qty 1

## 2023-07-17 MED ORDER — ESTRADIOL 0.1 MG/24HR TD PTWK
0.1000 mg | MEDICATED_PATCH | TRANSDERMAL | Status: DC
  Administered 2023-07-18: 0.1 mg via TRANSDERMAL
  Filled 2023-07-17 (×2): qty 1

## 2023-07-17 MED ORDER — IOHEXOL 350 MG/ML SOLN
60.0000 mL | Freq: Once | INTRAVENOUS | Status: AC | PRN
  Administered 2023-07-17: 60 mL via INTRAVENOUS

## 2023-07-17 MED ORDER — SENNOSIDES-DOCUSATE SODIUM 8.6-50 MG PO TABS: 1.0000 | ORAL_TABLET | Freq: Every evening | ORAL | Status: DC | PRN

## 2023-07-17 MED ORDER — MORPHINE SULFATE (PF) 4 MG/ML IV SOLN
4.0000 mg | INTRAVENOUS | Status: DC | PRN
  Administered 2023-07-17 – 2023-07-18 (×3): 4 mg via INTRAVENOUS
  Filled 2023-07-17 (×3): qty 1

## 2023-07-17 MED ORDER — METOPROLOL TARTRATE 5 MG/5ML IV SOLN
5.0000 mg | Freq: Once | INTRAVENOUS | Status: DC
  Filled 2023-07-17: qty 5

## 2023-07-17 MED ORDER — ENOXAPARIN SODIUM 40 MG/0.4ML IJ SOSY: 40.0000 mg | PREFILLED_SYRINGE | INTRAMUSCULAR | Status: DC

## 2023-07-17 MED ORDER — METOPROLOL TARTRATE 5 MG/5ML IV SOLN
2.5000 mg | Freq: Once | INTRAVENOUS | Status: AC
  Administered 2023-07-17: 2.5 mg via INTRAVENOUS

## 2023-07-17 MED ORDER — TRAMADOL HCL 50 MG PO TABS
25.0000 mg | ORAL_TABLET | Freq: Every day | ORAL | Status: DC | PRN
  Administered 2023-07-17 – 2023-07-18 (×2): 50 mg via ORAL
  Filled 2023-07-17 (×2): qty 1

## 2023-07-17 MED ORDER — POLYVINYL ALCOHOL 1.4 % OP SOLN: 1.0000 [drp] | Freq: Three times a day (TID) | OPHTHALMIC | Status: DC | PRN

## 2023-07-17 MED ORDER — FLUTICASONE PROPIONATE 50 MCG/ACT NA SUSP
1.0000 | Freq: Every day | NASAL | Status: DC
  Administered 2023-07-18 – 2023-07-21 (×4): 1 via NASAL
  Filled 2023-07-17: qty 16

## 2023-07-17 MED ORDER — ENOXAPARIN SODIUM 40 MG/0.4ML IJ SOSY
40.0000 mg | PREFILLED_SYRINGE | INTRAMUSCULAR | Status: DC
  Administered 2023-07-17 – 2023-07-20 (×4): 40 mg via SUBCUTANEOUS
  Filled 2023-07-17 (×4): qty 0.4

## 2023-07-17 MED ORDER — ACETAMINOPHEN 650 MG RE SUPP: 650.0000 mg | Freq: Four times a day (QID) | RECTAL | Status: DC | PRN

## 2023-07-17 MED ORDER — SODIUM CHLORIDE 0.9% FLUSH
3.0000 mL | Freq: Two times a day (BID) | INTRAVENOUS | Status: DC
  Administered 2023-07-18 – 2023-07-19 (×3): 3 mL via INTRAVENOUS

## 2023-07-17 MED ORDER — SODIUM CHLORIDE 0.9% FLUSH
10.0000 mL | Freq: Two times a day (BID) | INTRAVENOUS | Status: DC
  Administered 2023-07-18 – 2023-07-20 (×3): 10 mL via INTRAVENOUS

## 2023-07-17 MED ORDER — VITAMIN D 25 MCG (1000 UNIT) PO TABS
2000.0000 [IU] | ORAL_TABLET | Freq: Every evening | ORAL | Status: DC
  Administered 2023-07-17 – 2023-07-20 (×4): 2000 [IU] via ORAL
  Filled 2023-07-17 (×4): qty 2

## 2023-07-17 NOTE — H&P (Addendum)
Date: 07/17/2023               Patient Name:  Tanya Leon MRN: [id number]  DOB: Jan 04, 1941 Age / Sex: 82 y.o., female   PCP: [name], MD              Medical Service: Internal Medicine Teaching Service              Attending Physician: Dr. Dickie La    First Contact: [name], MS 4 Pager: [phone]  Second Contact: Dr. [name] Pager: [phone]            After Hours (After 5p/  First Contact    weekends / holidays): Second Contact     Chief Complaint: shortness of breath and palpitations  History of Present Illness: Patient is a 82 year old female with PMH of arthritis, severe MR, OSA on BiPAP, and depression who woke up with back pain, shortness of breath, and palpitations this morning. She endorses chronic dyspnea on exertion but her shortness of breath was much more severe than she had in the past. She has been told she had PVCs in the past but has not had such a sustained episode of heart palpitations before. Patient denied having any severe distress, diaphoresis or chest pain at the onset of symptoms. Patient checked her heart rate and blood pressure when her symptoms started. Her heart rate was in 100s and is usually 50-60s at baseline. Her SBP was low in the in the 80s when she checked this morning. Patient went to a local clinic and EMS was called. Blood pressure was initially 86/65 with EMS and later improved to 118/88. She was also found to be in new onset afib RVR. Chest CT and CXR showed large pneumothorax of the right lung and chest tube was successfully placed in the ED. Patient still reports some shortness of breath and pleuritic chest pain that has improved after chest tube placement. Denies any current chest pain, light headedness, diaphoresis, nausea, vomiting or abdominal pain.  Past Medical History: Arthritis Depression Moderate Mitral Regurgitation Severe Tricuspid Regurgitation Obstructive Sleep Apnea on Bipap Seasonal Allergies Hx of  Migraine Hx of Skin Cancer  Meds:   Lyrica 25-50 mg once a day Duloxetine 60 mg once a day Tizanidine 2 mg once a day at bedtime Excedrin 250-250-65 mg Q6 PRN Olopatadine 0.1% ophthalmic solution BID Flonase 50 mcg 2 sprays once a day Estradiol 0.1 mg patch Monday and Friday   Allergies: Allergies as of 07/17/2023 - Review Complete 07/17/2023  Allergen Reaction Noted   Crab [shellfish allergy] Swelling and Other (See Comments) 11/30/2014   Doxycycline Rash 11/05/2018    Family History:  Breast Cancer-Mother Myocardial Infarction- Mother Mitral Regurgitation- Father  Social History:  Patient lives with her husband at [facility] [employer] [facility]. She is able to perform ADLs independently. No smoking or drug use. Social alcohol use.  Surgical History: Bilateral knee arthroplasty Right hip replacement Lumbar fusion of L4-L5 with spinal rod Wrist Surgery Total Hysterectomy Strabismus Eye Surgery Glaucoma Eye Surgery  Review of Systems: A complete ROS was negative except as per HPI.   Physical Exam: Blood pressure 137/80, pulse (!) 51, temperature (!) 97.5 F (36.4 C), temperature source Oral, resp. rate 13, height 5\' 1"  (1.549 m), weight 65.8 kg, SpO2 98%.  Physical Exam Constitutional:      General: She is not in acute distress.    Appearance: She is not ill-appearing.  HENT:     Head:  Normocephalic and atraumatic.     Mouth/Throat:     Mouth: Mucous membranes are moist.  Cardiovascular:     Rate and Rhythm: Normal rate. Rhythm irregular.     Pulses: Normal pulses.     Heart sounds: Murmur heard.     Comments: Systolic murmur. Pulmonary:     Effort: Pulmonary effort is normal. No respiratory distress.     Breath sounds: No stridor. No wheezing, rhonchi or rales.  Chest:     Comments: Chest tube inserted and in place in the right lateral chest Abdominal:     General: Abdomen is flat.     Tenderness: There is no abdominal tenderness.  Musculoskeletal:         General: No swelling.     Comments: Venous stasis in the bilateral LE  Skin:    General: Skin is warm.  Neurological:     Mental Status: She is alert and oriented to person, place, and time.      EKG: Atrial flutter with LAFB CXR: Large right pneumothorax without definite mediastinal shift on initial. Repeat imaging showed proper placement of chest tube with resolution of right pneumothorax.   Assessment & Plan by Problem: Patient is an 82 year old female with PMH of arthritis, severe MR, OSA on Bipap, and depression who woke up with back pain, shortness of breath, and palpitations this morning. She is admitted for large pneumothorax of the right lung.  Large Right Pneumothorax without Mediastinal Shift-Resolved Patient had severe dyspnea and back pain this morning along with heart palpitations. CT and CXR showed large right pneumothorax without mediastinal shift. Likely due to use of Bipap machine at night vs spontaneous. -Chest tube placed successfully in the ED. Repeat CXR showed resolution of the pneumothorax. -Patient is on 2L Nasal Cannula and saturating at 97% -CT surgery consulted for chest tube follow-up. Awaiting recommendations.  New Onset Afib with RVR Atrial Flutter EKG 11:42 showed atrial flutter and LAFB. CHADS-VASC Score is 3 given age and her gender. Considered AC in this pt but her arrhythmia could be provoked by the pneumothorax. For now will keep on tele to see if further episodes occur.   Obstructive Sleep Apnea Patient uses BiPAP machine at home. Currently on 2L [place] -Hold bipap for now.  -Continue supplemental oxygen as needed  Moderate Mitral Regurgitation Severe Tricuspid Regurgitation Patient is following Dr. Allyson Sabal for Cardiology for her 4-5 year history of valvular disease. She endorses chronic dyspnea on exertion. Last Echo 01/2023. Will monitor for now.   Chronic Conditions: Arthritis-Tylenol and Tramadol PRN Depression- Restarting home  Cymbalta Post Menopausal Symptoms- Restarting home Estrogen patch Muscle Spasms- Restarting home Lyrica and Tizanidine Glaucoma- Restating home Eye Drops  Dispo: Admit patient to Inpatient with expected length of stay greater than 2 midnights.  SignedScherrie November, Medical Student 07/17/2023, 2:38 PM  Pager: [phone]  Attestation for Student Documentation:  I personally was present and performed or re-performed the history, physical exam and medical decision-making activities of this service and have verified that the service and findings are accurately documented in the student's note.  Gwenevere Abbot, MD 07/18/2023, 5:52 PM

## 2023-07-17 NOTE — Plan of Care (Signed)
  Problem: Education: Goal: Understanding of post-operative needs will improve Outcome: Progressing Goal: Individualized Educational Video(s) Outcome: Progressing   Problem: Clinical Measurements: Goal: Postoperative complications will be avoided or minimized Outcome: Progressing   Problem: Respiratory: Goal: Will regain and/or maintain adequate ventilation Outcome: Progressing

## 2023-07-17 NOTE — ED Notes (Signed)
ED TO INPATIENT HANDOFF REPORT  ED Nurse Name and Phone #: Pennie Rushing A. Krue Peterka, RN [phone]  S Name/Age/Gender Tanya Leon 82 y.o. female Room/Bed: 012C/012C  Code Status   Code Status: Full Code  Home/SNF/Other Home Patient oriented to: self, place, time, and situation Is this baseline? Yes   Triage Complete: Triage complete  Chief Complaint Pneumothorax on right [J93.9]  Triage Note [name] from [facility] for newly discovered afib. Patient woke up with mid thoracic pain, shob, and palpitations then visited the onsite clinic which discovered the afib. Initial BP with EMS 86/65, improved to 118/88 with EMS. Patient reports a few episodes of near syncope this morning but was ambulatory and A&Ox4 with EMS.   Allergies Allergies  Allergen Reactions   Crab [Shellfish Allergy] Swelling and Other (See Comments)    Soft-shelled crab   Doxycycline Rash    Level of Care/Admitting Diagnosis ED Disposition     ED Disposition  Admit   Condition  --   Comment  Hospital Area: [facility] [[id number]]  Level of Care: Progressive [102]  Admit to Progressive based on following criteria: RESPIRATORY PROBLEMS hypoxemic/hypercapnic respiratory failure that is responsive to NIPPV (BiPAP) or High Flow Nasal Cannula (6-80 lpm). Frequent assessment/intervention, no > Q2 hrs < Q4 hrs, to maintain oxygenation and pulmonary hygiene.  May admit patient to [facility] or [facility] if equivalent level of care is available:: No  Covid Evaluation: Asymptomatic - no recent exposure (last 10 days) testing not required  Diagnosis: Pneumothorax on right [147829]  Admitting Physician: Gwenevere Abbot [[id number]]  Attending Physician: Dickie La [[id number]]  Certification:: I certify this patient will need inpatient services for at least 2 midnights  Expected Medical Readiness: 07/20/2023          B Medical/Surgery History Past Medical History:  Diagnosis Date   Allergy 1973    Seasonal   Arthritis    Blood transfusion without reported diagnosis 08/15/2008   Following hip replacement   Cancer (HCC)    hx of skin cancer    Cataract 1998   Cataract surgery both eyes   Colon polyps    adenomatous   Complication of anesthesia    " pt stated they gave me too much - kept having to be reminded to breathe    Constipation    past hx- no longer an issue    Depression    hx of    Diverticulosis    DIVERTICULOSIS, COLON 01/13/2009   Glaucoma 05-12-14   Headache    history of migraines   Heart murmur    benign, dagnosed around age 25,prophylactic antibiotic   Low back pain    Lumbar spondylolysis    Osteopenia    Pneumonia    hx of as a baby    Pulmonary hypertension (HCC)    monitored by cards Dr Allyson Sabal annually via echo    PVC's (premature ventricular contractions)    i have them periodically , Dr Allyson Sabal had me cut back on caffeine and that helped    Rectocele    Sleep apnea    wears bi-pap, no oxygen    Tubular adenoma of colon    Wears hearing aid in both ears    Past Surgical History:  Procedure Laterality Date   ABDOMINAL HERNIA REPAIR Right    incisional   ABDOMINAL HYSTERECTOMY     with ovaries   BACK SURGERY  2009   L4-L5 with rods   BIOPSY  11/13/2018   Procedure: BIOPSY;  Surgeon: Beverley Fiedler, MD;  Location: [facility];  Service: Gastroenterology;;   BLADDER SUSPENSION N/A 04/09/2017   Procedure: TRANSVAGINAL TAPE (TVT) PROCEDURE;  Surgeon: Carrington Clamp, MD;  Location: [facility];  Service: Gynecology;  Laterality: N/A;   CATARACT EXTRACTION, BILATERAL     cataract left lens replacement     2016   COLON SURGERY  2007   Rpr abscessed ruptured diverticuli   COLONOSCOPY     COLONOSCOPY N/A 08/29/2017   Procedure: COLONOSCOPY;  Surgeon: Beverley Fiedler, MD;  Location: [facility];  Service: Gastroenterology;  Laterality: N/A;   COLONOSCOPY WITH PROPOFOL N/A 11/13/2018   Procedure: COLONOSCOPY WITH PROPOFOL with Methylane Blue Injection;   Surgeon: Beverley Fiedler, MD;  Location: [facility];  Service: Gastroenterology;  Laterality: N/A;   CYSTOCELE REPAIR N/A 04/09/2017   Procedure: ANTERIOR REPAIR (CYSTOCELE);  Surgeon: Carrington Clamp, MD;  Location: [facility];  Service: Gynecology;  Laterality: N/A;   CYSTOSCOPY N/A 04/09/2017   Procedure: CYSTOSCOPY;  Surgeon: Carrington Clamp, MD;  Location: [facility];  Service: Gynecology;  Laterality: N/A;   EYE MUSCLE SURGERY     FRACTURE SURGERY  11-13-17   L wrist-open reduction internal fixation   GLAUCOMA SURGERY Left 05/12/14   HERNIA REPAIR  05-28-11   Hernia result of 2007 colon surg   incisional ab  05/28/11   JOINT REPLACEMENT     right hip replacement    LAPAROSCOPIC RIGHT HEMI COLECTOMY Right 04/30/2019   Procedure: LAPAROSCOPIC  RIGHT HEMI COLECTOMY;  Surgeon: Andria Meuse, MD;  Location: [facility];  Service: General;  Laterality: Right;   LUMBAR FUSION     L4-L5   PARTIAL KNEE ARTHROPLASTY Right    PARTIAL KNEE ARTHROPLASTY Left    perforated deverticular abscess/laporotomy and colostomy     depression after this   POLYPECTOMY N/A 08/29/2017   Procedure: POLYPECTOMY;  Surgeon: Beverley Fiedler, MD;  Location: [facility];  Service: Gastroenterology;  Laterality: N/A;   POLYPECTOMY  11/13/2018   Procedure: POLYPECTOMY;  Surgeon: Beverley Fiedler, MD;  Location: [facility];  Service: Gastroenterology;;   POLYPECTOMY     RECTOCELE REPAIR  01/13/2020   RIGHT/LEFT HEART CATH AND CORONARY ANGIOGRAPHY N/A 02/13/2023   Procedure: RIGHT/LEFT HEART CATH AND CORONARY ANGIOGRAPHY;  Surgeon: Tonny Bollman, MD;  Location: [facility] INVASIVE CV LAB;  Service: Cardiovascular;  Laterality: N/A;   SPINE SURGERY  02-16-08   Fusion L4L5   stribismus eye surgery Right    take down of colostomy     TEE WITHOUT CARDIOVERSION N/A 02/13/2023   Procedure: TRANSESOPHAGEAL ECHOCARDIOGRAM;  Surgeon: Wendall Stade, MD;  Location: [facility] INVASIVE CV LAB;  Service: Cardiovascular;  Laterality: N/A;    tonsillectomly     TONSILLECTOMY     TOTAL HIP ARTHROPLASTY Right    TOTAL KNEE REVISION Left 11/24/2015   Procedure: Conversion from left lateral unicompartmental knee arthroplasty to left total knee arthroplasty;  Surgeon: Kathryne Hitch, MD;  Location: [facility];  Service: Orthopedics;  Laterality: Left;   TUBAL LIGATION  1976   WRIST SURGERY Right 03/27/2020     A IV Location/Drains/Wounds Patient Lines/Drains/Airways Status     Active Line/Drains/Airways     Name Placement date Placement time Site Days   Peripheral IV 07/17/23 20 G Left Antecubital 07/17/23  1131  Antecubital  less than 1   Chest Tube Lateral;Right Pleural 14 Fr. 07/17/23  1403  Pleural  less than 1  Intake/Output Last 24 hours No intake or output data in the 24 hours ending 07/17/23 1551  Labs/Imaging Results for orders placed or performed during the hospital encounter of 07/17/23 (from the past 48 hour(s))  CBC with Differential/Platelet     Status: None   Collection Time: 07/17/23 11:45 AM  Result Value Ref Range   WBC 5.5 4.0 - 10.5 K/uL   RBC 4.56 3.87 - 5.11 MIL/uL   Hemoglobin 13.7 12.0 - 15.0 g/dL   HCT 16.1 09.6 - 04.5 %   MCV 92.8 80.0 - 100.0 fL   MCH 30.0 26.0 - 34.0 pg   MCHC 32.4 30.0 - 36.0 g/dL   RDW 40.9 81.1 - 91.4 %   Platelets 155 150 - 400 K/uL   nRBC 0.0 0.0 - 0.2 %   Neutrophils Relative % 70 %   Neutro Abs 3.8 1.7 - 7.7 K/uL   Lymphocytes Relative 20 %   Lymphs Abs 1.1 0.7 - 4.0 K/uL   Monocytes Relative 7 %   Monocytes Absolute 0.4 0.1 - 1.0 K/uL   Eosinophils Relative 2 %   Eosinophils Absolute 0.1 0.0 - 0.5 K/uL   Basophils Relative 1 %   Basophils Absolute 0.0 0.0 - 0.1 K/uL   Immature Granulocytes 0 %   Abs Immature Granulocytes 0.01 0.00 - 0.07 K/uL    Comment: Performed at [facility], [address], [place], [place] [address]  Comprehensive metabolic panel     Status: Abnormal   Collection Time: 07/17/23 11:45 AM  Result Value Ref  Range   Sodium 136 135 - 145 mmol/L   Potassium 4.4 3.5 - 5.1 mmol/L   Chloride 106 98 - 111 mmol/L   CO2 22 22 - 32 mmol/L   Glucose, Bld 98 70 - 99 mg/dL    Comment: Glucose reference range applies only to samples taken after fasting for at least 8 hours.   BUN 16 8 - 23 mg/dL   Creatinine, Ser 6.21 0.44 - 1.00 mg/dL   Calcium 8.6 (L) 8.9 - 10.3 mg/dL   Total Protein 6.1 (L) 6.5 - 8.1 g/dL   Albumin 3.7 3.5 - 5.0 g/dL   AST 30 15 - 41 U/L   ALT 21 0 - 44 U/L   Alkaline Phosphatase 48 38 - 126 U/L   Total Bilirubin 1.0 0.3 - 1.2 mg/dL   GFR, Estimated >30 >86 mL/min    Comment: (NOTE) Calculated using the CKD-EPI Creatinine Equation (2021)    Anion gap 8 5 - 15    Comment: Performed at [facility], [address], [place], [place] [address]  TSH     Status: None   Collection Time: 07/17/23 11:45 AM  Result Value Ref Range   TSH 2.024 0.350 - 4.500 uIU/mL    Comment: Performed by a 3rd Generation assay with a functional sensitivity of <=0.01 uIU/mL. Performed at [facility], [address] [place], [place] [address]   Magnesium     Status: None   Collection Time: 07/17/23 11:45 AM  Result Value Ref Range   Magnesium 2.1 1.7 - 2.4 mg/dL    Comment: Performed at [facility], [address] [place], [place] [address]  I-stat chem 8, ED (not at [facility], [facility] or [facility])     Status: Abnormal   Collection Time: 07/17/23 11:51 AM  Result Value Ref Range   Sodium 139 135 - 145 mmol/L   Potassium 4.4 3.5 - 5.1  mmol/L   Chloride 105 98 - 111 mmol/L   BUN 21 8 - 23 mg/dL   Creatinine, Ser 1.61 0.44 - 1.00 mg/dL   Glucose, Bld 97 70 - 99 mg/dL    Comment: Glucose reference range applies only to samples taken after fasting for at least 8 hours.   Calcium, Ion 1.10 (L) 1.15 - 1.40 mmol/L   TCO2 24 22 - 32 mmol/L   Hemoglobin 14.3 12.0 - 15.0 g/dL   HCT 09.6 04.5 - 40.9 %   DG Chest Port 1 View  Result Date: 07/17/2023 CLINICAL DATA:  Post chest tube placement  EXAM: PORTABLE CHEST 1 VIEW COMPARISON:  Radiographs earlier today FINDINGS: Interval placement of right chest tube. Right pneumothorax no longer visualized. Right basilar atelectasis. No pleural effusion or pneumothorax. No displaced rib fractures stable cardiomediastinal silhouette. Aortic atherosclerotic calcification. IMPRESSION: Interval placement of right chest tube with resolution of right pneumothorax. Electronically Signed   By: Minerva Fester M.D.   On: 07/17/2023 15:06   DG Chest Port 1 View  Result Date: 07/17/2023 CLINICAL DATA:  Shortness of breath and palpitations EXAM: PORTABLE CHEST 1 VIEW COMPARISON:  CTA chest 02/08/2042 p.m. on 07/17/2023 subsequent radiograph 2:36 p.m. on 10/17 FINDINGS: Large right pneumothorax without definite mediastinal shift. On subsequent radiographic 2:36 p.m. after chest placement this has resolved. Atelectasis or infiltrates lower lungs no pleural effusion. Cardiomegaly. Aortic atherosclerotic calcification. No displaced rib fractures. IMPRESSION: Large right pneumothorax without definite mediastinal shift. On subsequent radiographic 2:36 p.m. after chest placement, this has resolved. Electronically Signed   By: Minerva Fester M.D.   On: 07/17/2023 15:05   CT Angio Chest PE W and/or Wo Contrast  Result Date: 07/17/2023 CLINICAL DATA:  High probability pulmonary embolism. Irregular heartbeat. EXAM: CT ANGIOGRAPHY CHEST WITH CONTRAST TECHNIQUE: Multidetector CT imaging of the chest was performed using the standard protocol during bolus administration of intravenous contrast. Multiplanar CT image reconstructions and MIPs were obtained to evaluate the vascular anatomy. RADIATION DOSE REDUCTION: This exam was performed according to the departmental dose-optimization program which includes automated exposure control, adjustment of the mA and/or kV according to patient size and/or use of iterative reconstruction technique. CONTRAST:  60mL OMNIPAQUE IOHEXOL 350  MG/ML SOLN COMPARISON:  None Available. FINDINGS: Cardiovascular: No filling defects within the pulmonary arteries to suggest acute pulmonary embolism. No significant vascular findings. Normal heart size. No pericardial effusion. Mediastinum/Nodes: No axillary or supraclavicular adenopathy. No mediastinal or hilar adenopathy. No pericardial fluid. Esophagus normal. Lungs/Pleura: Large RIGHT pneumothorax noted. No clear mediastinal shift. Atelectasis of the collapsed RIGHT middle lobe. LEFT lung clear. Upper Abdomen: Limited view of the liver, kidneys, pancreas are unremarkable. Normal adrenal glands. Musculoskeletal: No aggressive osseous lesion. Review of the MIP images confirms the above findings. IMPRESSION: 1. Large RIGHT pneumothorax. Follow-up chest radiograph at 2:33 hours demonstrates placement RIGHT chest tube with expansion of the RIGHT lung. 2. No evidence acute pulmonary embolism. Electronically Signed   By: Genevive Bi M.D.   On: 07/17/2023 15:01    Pending Labs Unresulted Labs (From admission, onward)     Start     Ordered   07/18/23 0500  CBC  Tomorrow morning,   R        07/17/23 1455   07/18/23 0500  Basic metabolic panel  Tomorrow morning,   R        07/17/23 1455            Vitals/Pain Today's Vitals   07/17/23 1430 07/17/23 1445  07/17/23 1532 07/17/23 1545  BP: 137/77 (!) 145/87  (!) 150/87  Pulse: (!) 52 (!) 51  (!) 59  Resp: 15 16  (!) 24  Temp:   98 F (36.7 C)   TempSrc:      SpO2: 100% 99%  97%  Weight:      Height:      PainSc:   7      Isolation Precautions No active isolations  Medications Medications  morphine (PF) 4 MG/ML injection 4 mg (4 mg Intravenous Given 07/17/23 1532)  sodium chloride flush (NS) 0.9 % injection 3 mL (0 mLs Intravenous Hold 07/17/23 1506)  sodium chloride flush (NS) 0.9 % injection 3 mL (has no administration in time range)  sodium chloride flush (NS) 0.9 % injection 10 mL (0 mLs Intravenous Hold 07/17/23 1506)   acetaminophen (TYLENOL) tablet 650 mg (has no administration in time range)    Or  acetaminophen (TYLENOL) suppository 650 mg (has no administration in time range)  senna-docusate (Senokot-S) tablet 1 tablet (has no administration in time range)  enoxaparin (LOVENOX) injection 40 mg (has no administration in time range)  metoprolol tartrate (LOPRESSOR) injection 2.5 mg (2.5 mg Intravenous Given 07/17/23 1210)  iohexol (OMNIPAQUE) 350 MG/ML injection 60 mL (60 mLs Intravenous Contrast Given 07/17/23 1238)    Mobility Usually walks by self, currently has a chest tube in place limiting her movement and independence      Focused Assessments    R Recommendations: See Admitting Provider Note  Report given to:   Additional Notes: Call or epic message for any additional details

## 2023-07-17 NOTE — ED Triage Notes (Signed)
[name] from [facility] for newly discovered afib. Patient woke up with mid thoracic pain, shob, and palpitations then visited the onsite clinic which discovered the afib. Initial BP with EMS 86/65, improved to 118/88 with EMS. Patient reports a few episodes of near syncope this morning but was ambulatory and A&Ox4 with EMS.

## 2023-07-17 NOTE — Hospital Course (Addendum)
   Family Hx Breast Cancer mother Father: heart disease and valvular issues   No smoking, some wine use nightly.

## 2023-07-17 NOTE — ED Provider Notes (Signed)
[facility] AT [facility] Provider Note   CSN: [id number] Arrival date & time: 07/17/23  1119     History  Chief Complaint  Patient presents with   Irregular Heart Beat    Tanya Leon is a 82 y.o. female.  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  Patient reports yesterday seem like a normal day.  She does have dyspnea on exertion which has been present for some time which she relates to her heart valves but does not seem to be getting any worse has not had any leg swelling, new cough fever congestion.  She reports she woke up this morning and shortly after waking up she started getting a discomfort in between her shoulder blades and feeling a little lightheaded.  She also felt a palpitation and she checked and her heart rate was fast.  She reports her heart rate normally is in the 60s and when she checked it it was over 100.  She denied feeling more short of breath or syncope.  She had no chest pain.  She went to the clinic and at that time was found to have a heart rate at 130 with a blood pressure in the 80s.  EMS reports upon their arrival her heart rates been between 115-130.  Blood pressures have been stable.  They gave patient oxygen because her O2 sat was 86% on room air and brought her here for further care.  The history is provided by the patient.       Home Medications Prior to Admission medications   Medication Sig Start Date End Date Taking? Authorizing Provider  amoxicillin (AMOXIL) 500 MG capsule Take 2,000 mg by mouth See admin instructions. Take 2000 mg 1 hour prior to dental work 12/21/19   [provider]  ascorbic acid (VITAMIN C) 1000 MG tablet  Take 1,000 mg by mouth daily. 12/11/11   [provider]  aspirin-acetaminophen-caffeine (EXCEDRIN MIGRAINE) [phone] MG tablet Take 2 tablets by mouth every 6 (six) hours as needed for headache.     [provider]  betamethasone, augmented, (DIPROLENE) 0.05 % lotion Apply 1 Application topically as needed (itchy ears). 11/29/22   [provider]  Biotin 5000 MCG CAPS Take 5,000 mcg by mouth daily.    [provider]  calcium carbonate (TUMS EX) 750 MG chewable tablet Chew 1 tablet by mouth daily as needed for heartburn.    [provider]  carboxymethylcellulose (REFRESH PLUS) 0.5 % SOLN Place 1 drop into both eyes 3 (three) times daily as needed (Dry eyes).    [provider]  Cholecalciferol (VITAMIN D3) 50 MCG (2000 UT) TABS Take 2,000 Units by mouth every evening.    [provider]  clobetasol (OLUX) 0.05 % topical foam Apply 1 Application topically once a week.    [provider]  clobetasol (TEMOVATE) 0.05 % external solution Apply 1 Application topically daily as needed (itching).    [provider]  cycloSPORINE (RESTASIS) 0.05 % ophthalmic emulsion Place 1 drop into both eyes 2 (two) times daily.     [provider]  DULoxetine HCl 40 MG CPEP Take 1 capsule by mouth daily. 07/11/20   [provider]  estradiol (VIVELLE-DOT) 0.1  MG/24HR patch Place 1 patch onto the skin 2 (two) times a week.     [provider]  EVENING PRIMROSE OIL PO Take 1,300 mg by mouth 2 (two) times daily.    [provider]  fexofenadine (ALLEGRA) 180 MG tablet Take 180 mg by mouth at bedtime.    [provider]  fluocinonide (LIDEX) 0.05 % external solution Apply 1 application  topically once a week. Applied to scalp after shampooing    [provider]  fluticasone (FLONASE) 50 MCG/ACT nasal spray SPRAY 2 SPRAYS INTO EACH NOSTRIL EVERY DAY Patient taking differently: Place 1 spray  into both nostrils daily. 05/18/20   Shelva Majestic, MD  Lutein 20 MG TABS Take 20 mg by mouth 2 (two) times daily.     [provider]  Multiple Minerals-Vitamins (ADVANCED CALCIUM/D/MAGNESIUM) TABS Take 1 tablet by mouth 2 (two) times daily. includes boron and betaine    [provider]  Multiple Vitamin (MULTIVITAMIN WITH MINERALS) TABS tablet Take 1 tablet by mouth daily.    [provider]  Multiple Vitamins-Minerals (PRESERVISION AREDS 2 PO) Take 1 tablet by mouth 2 (two) times daily.    [provider]  olopatadine (PATANOL) 0.1 % ophthalmic solution INSTILL 1 DROP INTO BOTH EYES TWICE A DAY Patient taking differently: Place 1 drop into both eyes daily as needed for allergies. 09/18/20   Shelva Majestic, MD  Omega 3 1000 MG CAPS Take 1,000 mg by mouth 2 (two) times daily.    [provider]  polyethylene glycol powder (GLYCOLAX/MIRALAX) powder Take 17 g by mouth daily.     [provider]  pregabalin (LYRICA) 25 MG capsule Take 1 capsule (25 mg total) by mouth 2 (two) times daily. TAKE 1 CAPSULE IN THE MORNING AND TAKE 1 CAPSULE IN THE EVENING Patient taking differently: Take 25-50 mg by mouth See admin instructions. TAKE 1 CAPSULE IN THE MORNING AND TAKE 2 CAPSULES AT BEDTIME 01/26/19   Shelva Majestic, MD  PRESCRIPTION MEDICATION Apply 1 Application topically at bedtime. Azelaic acid 15%, metronidazole 1%, ivermectin 1%    [provider]  tiZANidine (ZANAFLEX) 2 MG tablet TAKE 1 TABLET (2 MG TOTAL) BY MOUTH AT BEDTIME AS NEEDED. Patient taking differently: Take 2 mg by mouth at bedtime. 05/18/20   Shelva Majestic, MD      Allergies    Parke Simmers allergy] and Doxycycline    Review of Systems   Review of Systems  Physical Exam Updated Vital Signs BP 137/80   Pulse (!) 51   Temp (!) 97.5 F (36.4 C) (Oral)   Resp 13   Ht 5\' 1"  (1.549 m)   Wt 65.8 kg   SpO2 98%   BMI 27.40 kg/m  Physical Exam Vitals and  nursing note reviewed.  Constitutional:      General: She is not in acute distress.    Appearance: She is well-developed.  HENT:     Head: Normocephalic and atraumatic.  Eyes:     Pupils: Pupils are equal, round, and reactive to light.  Cardiovascular:     Rate and Rhythm: Regular rhythm. Tachycardia present.     Pulses: Normal pulses.     Heart sounds: Normal heart sounds. No murmur heard.    No friction rub.  Pulmonary:     Effort: Pulmonary effort is normal.     Breath sounds: Normal breath sounds. No wheezing or rales.  Abdominal:     General: Bowel sounds are  normal. There is no distension.     Palpations: Abdomen is soft.     Tenderness: There is no abdominal tenderness. There is no guarding or rebound.  Musculoskeletal:        General: No tenderness. Normal range of motion.     Right lower leg: No edema.     Left lower leg: No edema.     Comments: No edema  Skin:    General: Skin is warm and dry.     Findings: No rash.  Neurological:     Mental Status: She is alert and oriented to person, place, and time. Mental status is at baseline.     Cranial Nerves: No cranial nerve deficit.  Psychiatric:        Mood and Affect: Mood normal.        Behavior: Behavior normal.     ED Results / Procedures / Treatments   Labs (all labs ordered are listed, but only abnormal results are displayed) Labs Reviewed  COMPREHENSIVE METABOLIC PANEL - Abnormal; Notable for the following components:      Result Value   Calcium 8.6 (*)    Total Protein 6.1 (*)    All other components within normal limits  I-STAT CHEM 8, ED - Abnormal; Notable for the following components:   Calcium, Ion 1.10 (*)    All other components within normal limits  CBC WITH DIFFERENTIAL/PLATELET  MAGNESIUM  TSH    EKG EKG Interpretation Date/Time:  Thursday July 17 2023 11:42:42 EDT Ventricular Rate:  109 PR Interval:    QRS Duration:  106 QT Interval:  332 QTC Calculation: 445 R  Axis:   -76  Text Interpretation: new Atrial flutter Left anterior fascicular block Probable anteroseptal infarct, old Confirmed by Gwyneth Sprout ([id number]) on 07/17/2023 12:06:31 PM  Radiology No results found.  Procedures CHEST TUBE INSERTION  Date/Time: 07/17/2023 2:25 PM  Performed by: Gwyneth Sprout, MD Authorized by: Gwyneth Sprout, MD   Consent:    Consent obtained:  Verbal   Consent given by:  Patient and spouse   Risks, benefits, and alternatives were discussed: yes     Risks discussed:  Bleeding, incomplete drainage, pain and infection Universal protocol:    Procedure explained and questions answered to patient or proxy's satisfaction: yes     Relevant documents present and verified: yes     Test results available: yes     Imaging studies available: yes     Immediately prior to procedure, a time out was called: yes     Patient identity confirmed:  Verbally with patient Pre-procedure details:    Skin preparation:  Chlorhexidine   Preparation: Patient was prepped and draped in the usual sterile fashion   Sedation:    Sedation type:  None Anesthesia:    Anesthesia method:  Local infiltration   Local anesthetic:  Lidocaine 1% WITH epi Procedure details:    Placement location:  R lateral   Scalpel size:  11   Tube size (Fr):  Minicatheter   Ultrasound guidance: no     Tension pneumothorax: no     Tube connected to:  Water seal   Drainage characteristics:  Air only   Suture material:  2-0 silk   Dressing:  Xeroform gauze Post-procedure details:    Procedure completion:  Tolerated     Medications Ordered in ED Medications  morphine (PF) 4 MG/ML injection 4 mg (has no administration in time range)  metoprolol tartrate (LOPRESSOR) injection 2.5 mg (2.5 mg Intravenous Given  07/17/23 1210)  iohexol (OMNIPAQUE) 350 MG/ML injection 60 mL (60 mLs Intravenous Contrast Given 07/17/23 1238)    ED Course/ Medical Decision Making/ A&P                                  Medical Decision Making Amount and/or Complexity of Data Reviewed Independent Historian: EMS External Data Reviewed: notes. Labs: ordered. Decision-making details documented in ED Course. Radiology: ordered and independent interpretation performed. Decision-making details documented in ED Course. ECG/medicine tests: ordered and independent interpretation performed. Decision-making details documented in ED Course.  Risk Prescription drug management. Decision regarding hospitalization.   Pt with multiple medical problems and comorbidities and presenting today with a complaint that caries a high risk for morbidity and mortality.  Here today with multiple symptoms.  Patient found to be in atrial fibrillation when she was seen at her doctor's office with hypotension.  Here she initially looked like she was in a sinus rhythm however when I independently interpreted patient's EKG she appears to be in atrial flutter.  Most likely a result of her known heart valve issues.  Patient is having a discomfort however between her shoulder blades and was hypoxic initially upon EMSs arrival.  She is currently on 4 L and does not wear oxygen at home.  Will do a CTA to ensure no evidence of PE.  Low suspicion for dissection.  Patient's blood pressure is stable at this time.  Will check thyroid, electrolytes.  Low suspicion for sepsis.  Patient appears to have been doing well prior to this morning has been eating and drinking normally and has had no medication changes.  Will give low-dose of Lopressor to try to get heart rate less than 100. I independently interpreted patient's labs and CBC within normal limits, CMP without acute findings, magnesium is normal.  I have independently visualized and interpreted pt's images today.  Chest x-ray was delayed and patient went for chest CT about the same time as she did for x-ray.  Chest x-ray today shows a large right-sided pneumothorax.  This must be spontaneous as patient  has had no trauma does not have any COPD or lung disease.  it would also explain patient's hypoxia.  For chest CT pneumothorax is also present.  Still currently waiting on radiology read.  Discussed the case with CT surgery who states they will follow the chest tube while patient is admitted.  Pigtail catheter was placed without any complication.  Repeat imaging is pending.  After 2.5 the Lopressor patient has been in a sinus rhythm.  Question whether atrial fibrillation was a result of patient's spontaneous pneumothorax.  Unclear if she would need chronic anticoagulation.  Chadvasc of 3 for age and sex.  Consulted unassigned medicine for admission.  All this was discussed with the patient and her husband who are comfortable with this plan.  Patient remained on constant cardiac monitoring without any evidence of any further dysrhythmias.  O2 sats are greater than 98%.  She was continued on 3 L nasal cannula.  CRITICAL CARE Performed by: Rebekah Sprinkle Total critical care time: 30 minutes Critical care time was exclusive of separately billable procedures and treating other patients. Critical care was necessary to treat or prevent imminent or life-threatening deterioration. Critical care was time spent personally by me on the following activities: development of treatment plan with patient and/or surrogate as well as nursing, discussions with consultants, evaluation of patient's response to  treatment, examination of patient, obtaining history from patient or surrogate, ordering and performing treatments and interventions, ordering and review of laboratory studies, ordering and review of radiographic studies, pulse oximetry and re-evaluation of patient's condition.    Repeat x-ray showed chest tube in good placement with resolution of pneumothorax        Final Clinical Impression(s) / ED Diagnoses Final diagnoses:  Atrial fibrillation with RVR (HCC)  Pneumothorax on right    Rx / DC  Orders ED Discharge Orders     None         Gwyneth Sprout, MD 07/17/23 1657

## 2023-07-18 ENCOUNTER — Inpatient Hospital Stay (HOSPITAL_COMMUNITY): Payer: Medicare Other

## 2023-07-18 DIAGNOSIS — I4891 Unspecified atrial fibrillation: Secondary | ICD-10-CM | POA: Diagnosis not present

## 2023-07-18 DIAGNOSIS — J9311 Primary spontaneous pneumothorax: Secondary | ICD-10-CM | POA: Diagnosis not present

## 2023-07-18 DIAGNOSIS — J939 Pneumothorax, unspecified: Secondary | ICD-10-CM

## 2023-07-18 LAB — CBC
HCT: 39.4 % (ref 36.0–46.0)
Hemoglobin: 12.7 g/dL (ref 12.0–15.0)
MCH: 30.1 pg (ref 26.0–34.0)
MCHC: 32.2 g/dL (ref 30.0–36.0)
MCV: 93.4 fL (ref 80.0–100.0)
Platelets: 142 10*3/uL — ABNORMAL LOW (ref 150–400)
RBC: 4.22 MIL/uL (ref 3.87–5.11)
RDW: 13.8 % (ref 11.5–15.5)
WBC: 8.9 10*3/uL (ref 4.0–10.5)
nRBC: 0 % (ref 0.0–0.2)

## 2023-07-18 LAB — BASIC METABOLIC PANEL
Anion gap: 11 (ref 5–15)
BUN: 17 mg/dL (ref 8–23)
CO2: 25 mmol/L (ref 22–32)
Calcium: 9.2 mg/dL (ref 8.9–10.3)
Chloride: 102 mmol/L (ref 98–111)
Creatinine, Ser: 0.84 mg/dL (ref 0.44–1.00)
GFR, Estimated: >60 mL/min (ref >60)
Glucose, Bld: 96 mg/dL (ref 70–99)
Potassium: 3.8 mmol/L (ref 3.5–5.1)
Sodium: 138 mmol/L (ref 135–145)

## 2023-07-18 MED ORDER — POLYETHYLENE GLYCOL 3350 17 G PO PACK
17.0000 g | PACK | Freq: Every day | ORAL | Status: DC
  Administered 2023-07-18 – 2023-07-21 (×4): 17 g via ORAL
  Filled 2023-07-18 (×4): qty 1

## 2023-07-18 MED ORDER — OXYCODONE-ACETAMINOPHEN 5-325 MG PO TABS
1.0000 | ORAL_TABLET | ORAL | Status: DC | PRN
  Administered 2023-07-18 – 2023-07-20 (×6): 1 via ORAL
  Filled 2023-07-18 (×6): qty 1

## 2023-07-18 NOTE — Plan of Care (Signed)
  Problem: Health Behavior/Discharge Planning: Goal: Ability to manage health-related needs will improve 07/18/2023 0312 by Jill Side, RN Outcome: Progressing 07/18/2023 0311 by Jill Side, RN Outcome: Progressing

## 2023-07-18 NOTE — Progress Notes (Addendum)
Subjective:  Patient was seen eating breakfast and examined in her chair this morning. She still reports some back pain and generalized chest wall tenderness especially at the site of the chest tube insertion. Patient had some difficulties laying in bed due to the chest tube discomfort but reports some improvement of symptoms when she sits up. Chest tube was in place and did not show any signs of air leakage. Overall, she is reporting to be improving. Endorses some weakness when she is ambulating. She denies any shortness of breath, lightheadedness, chest pain, or palpitations.  Objective:  Vital signs in last 24 hours: Vitals:   07/17/23 1647 07/17/23 1942 07/18/23 0355 07/18/23 0405  BP: (!) 159/86 106/64  133/72  Pulse: 65 67  (!) 56  Resp: 13 16  16   Temp: 97.7 F (36.5 C) (!) 97.5 F (36.4 C)  97.7 F (36.5 C)  TempSrc: Axillary Axillary  Oral  SpO2:  97%  100%  Weight:   65.9 kg   Height:       Supplemental Oxygen: 4L Nasal Cannula at 98%  Physical Exam Constitutional:      General: She is not in acute distress.    Appearance: She is not ill-appearing or diaphoretic.  HENT:     Head: Normocephalic and atraumatic.  Cardiovascular:     Rate and Rhythm: Normal rate and regular rhythm.     Pulses: Normal pulses.     Heart sounds: Murmur heard.     Comments: Chronic systolic murmur heard. Pulmonary:     Effort: Pulmonary effort is normal. No respiratory distress.     Breath sounds: Normal breath sounds.  Chest:     Chest wall: Tenderness present.     Comments: Chest tube in place in the right lateral chest wall. Patient reports some tenderness around the insertion site. No erythema, edema or signs of infection noted at site. Abdominal:     General: Abdomen is flat.  Musculoskeletal:        General: No swelling or tenderness.  Skin:    General: Skin is warm.  Neurological:     Mental Status: She is alert and oriented to person, place, and time.  Psychiatric:         Mood and Affect: Mood normal.     Assessment/Plan: Patient is an 82 year old female with PMH of arthritis, severe MR, OSA on Bipap, and depression who woke up with back pain, shortness of breath, and palpitations this morning. She is admitted for large pneumothorax of the right lung.   Large Right Pneumothorax without Mediastinal Shift-Resolved Patient had severe dyspnea and back pain this morning along with heart palpitations. CT and CXR showed large right pneumothorax without mediastinal shift. Likely due to use of Bipap machine at night vs spontaneous. -Chest tube placed successfully in the ED. Repeat CXR showed resolution of the pneumothorax. -Patient is on 2L Nasal Cannula and saturating at 97% -CT surgery consulted for chest tube follow-up. They are planning to clamp the chest tube 10/19 and assess lung function. If patient remains stable, the plan is remove the chest tube 10/20. -Will consult pulmonology for their recommendations of patient continuing her home BiPAP. -OT saw patient and observed some decreased activity tolerance. They expect improvement in status with continued with acute skilled OT services. -Pain regiment: Tylenol 650 mg Q6 PRN for mild pain, Tramadol 25-50 mg daily PRN, and Oxycodone 5-10 Q4 PRN for severe pain   New Onset Afib with RVR Atrial Flutter  EKG 11:42 showed atrial flutter and LAFB. CHADS-VASC Score is 3 given age and her gender. Considered AC in this pt but her arrhythmia could be provoked by the pneumothorax.  Patient remained in sinus rhythm overnight. Will continue to monitor telemetry.   Obstructive Sleep Apnea Patient uses BiPAP machine at home. Currently on 2L [place] -Hold bipap for now.  -Continue supplemental oxygen as needed   Moderate Mitral Regurgitation Severe Tricuspid Regurgitation Patient is following Dr. Allyson Sabal for Cardiology for her 4-5 year history of valvular disease. She endorses chronic dyspnea on exertion. Last Echo 01/2023. Will  monitor for now.   Thrombocytopenia Platelet count at 142. -No plans for treatment at this time. Will continue to monitor.   Chronic Conditions: Arthritis-Tylenol and Tramadol PRN Depression- Home Cymbalta Post Menopausal Symptoms- Home Estrogen patch MF Muscle Spasms- Home Lyrica and Tizanidine Glaucoma- Home Eye Drops   Dispo: Admit patient to Inpatient with expected length of stay greater than 2 midnights.     LOS: 1 day   Signature: [name], MS4  [name], Medical Student  07/18/2023, 7:25 AM   Attestation for Student Documentation:  I personally was present and performed or re-performed the history, physical exam and medical decision-making activities of this service and have verified that the service and findings are accurately documented in the student's note.  Gwenevere Abbot, MD 07/18/2023, 5:14 PM

## 2023-07-18 NOTE — Evaluation (Signed)
Physical Therapy Evaluation Patient Details Name: Tanya Leon MRN: [id number] DOB: Aug 15, 1941 Today's Date: 07/18/2023  History of Present Illness  Pt is an 82 yo female who presented to local clinic with increased SOB, back pain, hypotension, palpitations, and tachycardia. EMS was called and brought pt to [facility] ED on 10/17 where pt was found to have new onset afib RVR and chest CT and CXR showed large pneumothorax of the right lung. Chest tube was successfully placed in the ED. PMH of chronic dyspnea on exertion, arthritis, severe MR, OSA on BiPAP, PVCs, and depression.  Clinical Impression  Pt admitted with above diagnosis. Pt was able to ambulate with RW 110 feet with RW with CGA. Pt aware PT reocmmends to use rollator or RW for safety at home. Will follow acutely.  Pt currently with functional limitations due to the deficits listed below (see PT Problem List). Pt will benefit from acute skilled PT to increase their independence and safety with mobility to allow discharge.           If plan is discharge home, recommend the following: A little help with walking and/or transfers;A little help with bathing/dressing/bathroom;Assistance with cooking/housework;Help with stairs or ramp for entrance;Assist for transportation   Can travel by private vehicle        Equipment Recommendations Rollator (4 wheels)  Recommendations for Other Services       Functional Status Assessment Patient has had a recent decline in their functional status and demonstrates the ability to make significant improvements in function in a reasonable and predictable amount of time.     Precautions / Restrictions Precautions Precautions: Fall Restrictions Weight Bearing Restrictions: No Other Position/Activity Restrictions: R chest tube      Mobility  Bed Mobility               General bed mobility comments: Pt sitting in recliner at beginning and end of session    Transfers Overall transfer level:  Needs assistance Equipment used: 1 person hand held assist Transfers: Sit to/from Stand, Bed to chair/wheelchair/BSC Sit to Stand: Contact guard assist   Step pivot transfers: Contact guard assist            Ambulation/Gait Ambulation/Gait assistance: Min assist Gait Distance (Feet): 150 Feet Assistive device: Rolling walker (2 wheels), None Gait Pattern/deviations: Step-through pattern, Decreased stride length, Trunk flexed   Gait velocity interpretation: <1.31 ft/sec, indicative of household ambulator   General Gait Details: Pt was able to ambulate to bathroom to have BM with HHA of 1 and min assist for stability.  Needed a little help wiping herself and definite need of at least 1 UE support for balance. Pt then ambulated with RW with CGA.  Stairs            Wheelchair Mobility     Tilt Bed    Modified Rankin (Stroke Patients Only)       Balance Overall balance assessment: Needs assistance Sitting-balance support: No upper extremity supported, Single extremity supported, Feet supported Sitting balance-Leahy Scale: Good     Standing balance support: During functional activity, Bilateral upper extremity supported, Single extremity supported Standing balance-Leahy Scale: Poor Standing balance comment: relies on at least 1 UE support for balance                             Pertinent Vitals/Pain Pain Assessment Pain Assessment: Faces Faces Pain Scale: Hurts even more Pain Location: R chest at sit of  chest tube and going around Right side to back Pain Descriptors / Indicators: Aching, Discomfort, Guarding, Grimacing, Sore Pain Intervention(s): Limited activity within patient's tolerance, Monitored during session, Repositioned    Home Living Family/patient expects to be discharged to:: Private residence Living Arrangements: Spouse/significant other Available Help at Discharge: Family;Available 24 hours/day;Other (Comment) Brewing technologist available PRN for meal prep, home managemet tasks, etc.) Type of Home: Apartment (at [employer]) Home Access: Elevator;Level entry (live on 4th floor - 3 flights of stairs up)       Home Layout: One level Home Equipment: Grab bars - tub/shower;Grab bars - toilet;Adaptive equipment      Prior Function Prior Level of Function : Independent/Modified Independent;Driving             Mobility Comments: At baseline, pt is Independent with funcitonal mobility without an AD. Pt reports one fall about 6 weeks ago when she was dizzy when getting up at night. Pt reports frequent dizziness when going from supine to sitting. ADLs Comments: At baseline, pt is Independent with ADLs and IADLs. Pt reports  she and her husband often use meal prep services in senior living community.     Extremity/Trunk Assessment   Upper Extremity Assessment Upper Extremity Assessment: Right hand dominant    Lower Extremity Assessment Lower Extremity Assessment: Generalized weakness    Cervical / Trunk Assessment Cervical / Trunk Assessment: Normal  Communication   Communication Communication: No apparent difficulties  Cognition Arousal: Alert Behavior During Therapy: WFL for tasks assessed/performed Overall Cognitive Status: Within Functional Limits for tasks assessed                                 General Comments: AAOx4 and pleasant throughout session. Pt demonstrates good safety awareness and insight into deficits.        General Comments General comments (skin integrity, edema, etc.): Removed O2 and sats did drop breifly during walk to 84% but with pursed lip breathing returned to >88% on RA.  Replaced O2 at 2L once back to room as pt DOE 2/4.    Exercises General Exercises - Lower Extremity Ankle Circles/Pumps: AROM, Both, 10 reps, Supine Long Arc Quad: AROM, Both, 10 reps, Seated Hip Flexion/Marching: AROM, Both, 10 reps, Seated    Assessment/Plan    PT Assessment Patient needs continued PT services  PT Problem List Decreased activity tolerance;Decreased balance;Decreased mobility;Decreased knowledge of use of DME;Decreased safety awareness;Decreased knowledge of precautions;Cardiopulmonary status limiting activity       PT Treatment Interventions DME instruction;Gait training;Functional mobility training;Therapeutic activities;Therapeutic exercise;Balance training;Patient/family education    PT Goals (Current goals can be found in the Care Plan section)  Acute Rehab PT Goals Patient Stated Goal: to go home PT Goal Formulation: With patient Time For Goal Achievement: 08/01/23 Potential to Achieve Goals: Good    Frequency Min 1X/week     Co-evaluation               AM-PAC PT "6 Clicks" Mobility  Outcome Measure Help needed turning from your back to your side while in a flat bed without using bedrails?: A Little Help needed moving from lying on your back to sitting on the side of a flat bed without using bedrails?: A Little Help needed moving to and from a bed to a chair (including a wheelchair)?: A Little Help needed standing up from a chair using your arms (e.g., wheelchair or  bedside chair)?: A Little Help needed to walk in hospital room?: A Little Help needed climbing 3-5 steps with a railing? : A Little 6 Click Score: 18    End of Session Equipment Utilized During Treatment: Gait belt;Oxygen Activity Tolerance: Patient limited by fatigue Patient left: in chair;with call bell/phone within reach;with chair alarm set Nurse Communication: Mobility status PT Visit Diagnosis: Unsteadiness on feet (R26.81);Muscle weakness (generalized) (M62.81)    Time: 5409-8119 PT Time Calculation (min) (ACUTE ONLY): 25 min   Charges:   PT Evaluation $PT Eval Moderate Complexity: 1 Mod PT Treatments $Gait Training: 8-22 mins PT General Charges $$ ACUTE PT VISIT: 1 Visit         Jakeria Caissie M,PT Acute  Rehab Services [phone]   Bevelyn Buckles 07/18/2023, 3:41 PM

## 2023-07-18 NOTE — Evaluation (Signed)
Occupational Therapy Evaluation Patient Details Name: Tanya Leon MRN: [id number] DOB: [birth year] Today's Date: 07/18/2023   History of Present Illness Pt is an 82 yo female who presented to local clinic with increased SOB, back pain, hypotension, palpitations, and tachycardia. EMS was called and brought pt to [facility] ED on 10/17 where pt was found to have new onset afib RVR and chest CT and CXR showed large pneumothorax of the right lung. Chest tube was successfully placed in the ED. PMH of chronic dyspnea on exertion, arthritis, severe MR, OSA on BiPAP, PVCs, and depression.   Clinical Impression   At baseline, pt is Independent with ADLs, IADLs, and functional mobility without an AD and drives. Pt now presents with decreased activity tolerance, decreased balance during functional tasks. Cardiopulmonary status affecting functional level, pain affecting functional level, and decreased safety and independence with functional tasks. Pt currently demonstrates ability to complete UB ADLs Independent to Min assist, LB ADLs with Contact guard assist, and functional transfers/mobility with Contact guard assist with hand held assist +1. Pt will benefit from acute skilled OT services to address deficits outlined below and increase safety and independence with functional tasks. No post acute skilled OT follow up indicated at this time.       If plan is discharge home, recommend the following: A little help with walking and/or transfers;A little help with bathing/dressing/bathroom;Assistance with cooking/housework;Assist for transportation;Help with stairs or ramp for entrance    Functional Status Assessment  Patient has had a recent decline in their functional status and demonstrates the ability to make significant improvements in function in a reasonable and predictable amount of time.  Equipment Recommendations  Tub/shower seat    Recommendations for Other Services       Precautions / Restrictions  Precautions Precautions: Fall Restrictions Weight Bearing Restrictions: No Other Position/Activity Restrictions: R chest tube      Mobility Bed Mobility               General bed mobility comments: Pt sitting in recliner at beginning and end of session    Transfers Overall transfer level: Needs assistance Equipment used: 1 person hand held assist Transfers: Sit to/from Stand, Bed to chair/wheelchair/BSC Sit to Stand: Contact guard assist     Step pivot transfers: Contact guard assist            Balance Overall balance assessment: Needs assistance Sitting-balance support: No upper extremity supported, Single extremity supported, Feet supported Sitting balance-Leahy Scale: Good     Standing balance support: Single extremity supported, No upper extremity supported, During functional activity Standing balance-Leahy Scale: Fair                             ADL either performed or assessed with clinical judgement   ADL Overall ADL's : Needs assistance/impaired Eating/Feeding: Independent;Sitting   Grooming: Supervision/safety;Contact guard assist;Standing   Upper Body Bathing: Minimal assistance;Sitting;Cueing for compensatory techniques Upper Body Bathing Details (indicate cue type and reason): assist with lines and chest tube Lower Body Bathing: Contact guard assist;Sitting/lateral leans;Sit to/from stand;Cueing for compensatory techniques Lower Body Bathing Details (indicate cue type and reason): assist with lines and chest tube Upper Body Dressing : Minimal assistance;Sitting;Cueing for compensatory techniques Upper Body Dressing Details (indicate cue type and reason): assist with lines and chest tube Lower Body Dressing: Contact guard assist;Sitting/lateral leans;Sit to/from stand Lower Body Dressing Details (indicate cue type and reason): assist with lines and chest tube Toilet Transfer:  Contact guard assist;BSC/3in1 (hand held assist +1)    Toileting- Clothing Manipulation and Hygiene: Contact guard assist;Sitting/lateral lean;Sit to/from stand       Functional mobility during ADLs: Contact guard assist (hand held assist +1) General ADL Comments: Pt with decreased activity tolerance and fatiquing quickly.     Vision Baseline Vision/History: 1 Wears glasses;6 Macular Degeneration;4 Cataracts (for distance; hx cataract surgery) Ability to See in Adequate Light: 1 Impaired Patient Visual Report: No change from baseline (vitrectomoy in September - pt reports it was successful)       Perception         Praxis         Pertinent Vitals/Pain Pain Assessment Pain Assessment: Faces Faces Pain Scale: Hurts even more Pain Location: R chest at sit of chest tube and going around Right side to back Pain Descriptors / Indicators: Aching, Discomfort, Guarding, Grimacing, Sore Pain Intervention(s): Limited activity within patient's tolerance, Monitored during session, Repositioned     Extremity/Trunk Assessment Upper Extremity Assessment Upper Extremity Assessment: Right hand dominant;Overall [facility] for tasks assessed   Lower Extremity Assessment Lower Extremity Assessment: Defer to PT evaluation   Cervical / Trunk Assessment Cervical / Trunk Assessment: Normal   Communication Communication Communication: No apparent difficulties   Cognition Arousal: Alert Behavior During Therapy: WFL for tasks assessed/performed Overall Cognitive Status: Within Functional Limits for tasks assessed                                 General Comments: AAOx4 and pleasant throughout session. Pt demonstrates good safety awareness and insight into deficits.     General Comments  Pt on 2L conitnuous O2 through nasal cannula upon OT arrival with all VSS. O2 removed during O2 session with all VSS on RA throughout session. Pt placed back on 2L continuous O2 through nasal cannula at end of session. Pt's husband, PA, and imaging staff  each present during a portion of session.    Exercises     Shoulder Instructions      Home Living Family/patient expects to be discharged to:: Private residence Living Arrangements: Spouse/significant other Available Help at Discharge: Family;Available 24 hours/day;Other (Comment) Paediatric nurse available PRN for meal prep, home managemet tasks, etc.) Type of Home: Apartment (at [employer]) Home Access: Elevator;Level entry (live on 4th floor - 3 flights of stairs up)     Home Layout: One level     Bathroom Shower/Tub: Producer, television/film/video: Handicapped height Bathroom Accessibility: Yes How Accessible: Accessible via walker (won't fit into the shower) Home Equipment: Grab bars - tub/shower;Grab bars - toilet;Adaptive equipment Adaptive Equipment: Reacher        Prior Functioning/Environment Prior Level of Function : Independent/Modified Independent;Driving             Mobility Comments: At baseline, pt is Independent with funcitonal mobility without an AD. Pt reports one fall about 6 weeks ago when she was dizzy when getting up at night. Pt reports frequent dizziness when going from supine to sitting. ADLs Comments: At baseline, pt is Independent with ADLs and IADLs. Pt reports  she and her husband often use meal prep services in senior living community.        OT Problem List: Decreased activity tolerance;Impaired balance (sitting and/or standing);Cardiopulmonary status limiting activity;Pain      OT Treatment/Interventions: Self-care/ADL training;Energy conservation;DME and/or AE instruction;Therapeutic activities;Balance training;Patient/family education  OT Goals(Current goals can be found in the care plan section) Acute Rehab OT Goals Patient Stated Goal: To return home and heal well OT Goal Formulation: With patient Time For Goal Achievement: 08/01/23 Potential to Achieve Goals: Good ADL Goals Pt Will  Perform Grooming: with modified independence;standing Pt Will Perform Upper Body Bathing: with supervision;sitting Pt Will Perform Lower Body Bathing: with supervision;sitting/lateral leans;sit to/from stand Pt Will Perform Lower Body Dressing: with modified independence;sit to/from stand;sitting/lateral leans Pt Will Transfer to Toilet: with modified independence;regular height toilet;grab bars (with least restrictive AD) Pt Will Perform Toileting - Clothing Manipulation and hygiene: with modified independence;sitting/lateral leans;sit to/from stand Additional ADL Goal #1: Patient will demonstrate ability to Independently state 4 energy conservation techniques to increase safety and independence with functional tasks in the home. Additional ADL Goal #2: Patient will demonstrate understanding of training in recognizing, responding to, and managing episodes of orthostatic hypotension through teach back with handout provided.  OT Frequency: Min 1X/week    Co-evaluation              AM-PAC OT "6 Clicks" Daily Activity     Outcome Measure Help from another person eating meals?: None Help from another person taking care of personal grooming?: A Little Help from another person toileting, which includes using toliet, bedpan, or urinal?: A Little Help from another person bathing (including washing, rinsing, drying)?: A Little Help from another person to put on and taking off regular upper body clothing?: A Little Help from another person to put on and taking off regular lower body clothing?: A Little 6 Click Score: 19   End of Session Equipment Utilized During Treatment: Oxygen Nurse Communication: Mobility status  Activity Tolerance: Patient tolerated treatment well;Patient limited by fatigue;Patient limited by pain Patient left: in chair;with call bell/phone within reach;with family/visitor present;Other (comment) (with imaging staff present)  OT Visit Diagnosis: Other abnormalities of  gait and mobility (R26.89);Unsteadiness on feet (R26.81);History of falling (Z91.81);Pain;Other (comment) (Decreased activity tolerance)                Time: 1478-2956 OT Time Calculation (min): 48 min Charges:  OT General Charges $OT Visit: 1 Visit OT Evaluation $OT Eval Low Complexity: 1 Low OT Treatments $Self Care/Home Management : 23-37 mins  Kimisha Eunice "Orson Eva., OTR/L, MA Acute Rehab [phone]   Lendon Colonel 07/18/2023, 11:11 AM

## 2023-07-18 NOTE — Progress Notes (Signed)
Mobility Specialist Progress Note:   07/18/23 1512  Mobility  Activity Ambulated with assistance in hallway  Level of Assistance Standby assist, set-up cues, supervision of patient - no hands on  Assistive Device Front wheel walker  Distance Ambulated (ft) 150 ft  Activity Response Tolerated well  Mobility Referral Yes  $Mobility charge 1 Mobility  Mobility Specialist Start Time (ACUTE ONLY) 1450  Mobility Specialist Stop Time (ACUTE ONLY) 1508  Mobility Specialist Time Calculation (min) (ACUTE ONLY) 18 min   Pre Mobility: 66 HR , 96% SpO2 RA During Mobility: 73 HR , 81-92% SpO2 RA Post Mobility: 61 HR , 98% SpO2 4 L  Pt received in BR, agreeable to ambulate in hallway. SpO2 levels in low 80's during ambulation on RA. Levels increased to 92% when returning to room. Pt denied any SOB or discomfort during session, asymptomatic throughout. Pt left in bed with call bell in reach and all needs met.   Leory Plowman  Mobility Specialist Please contact via [employer] office at [phone]

## 2023-07-18 NOTE — Plan of Care (Signed)
  Problem: Health Behavior/Discharge Planning: Goal: Ability to manage health-related needs will improve Outcome: Progressing   

## 2023-07-18 NOTE — Consult Note (Addendum)
[address]       [place] [address]             [phone]        Tanya Leon [facility] Medical Record #[id number] Date of Birth: [birth year]  Referring: No ref. 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 She was seen on 02/17/23 by Dr. Leafy Ro for surgical consult relating to her valves. At this time she was not a candidate for mitral clip and she was also felt to not be a surgical candidate due to her lack of symptoms at the time and surveillance was recommended. It was noted that if symptoms worsened or there were new echo findings of LV dysfunction or PHTN or she developed atrial fibrillation then could be a surgical candidate.   The patient presented to the clinic at [facility] on 10/17 due to pain between her shoulder blades that woke her up, shortness of breath, dizziness and hypotension with palpitations. She admits to dyspnea on exertion that has remained stable which she relates to her valvular issues. She denied chest apin, chest tightness, diaphoresis, nausea and vomiting. Her husband also notes that she has been more short of breath the last few weeks when walking the stairs. Upon EMS arrival her heart reate was in the 130s with SBP in the 80s. EKG in the ED showed atrial flutter with RVR, she was given a dose of Lopressor which converted her to NSR. She was also requiring about 4L of Claude O2. CXR showed a large right sided pneumothorax, pigtail catheter was placed while in the ED. Follow up CXR on 10/17 showed resolution of her right pneumothorax. Chest tube has been placed to water seal. She was admitted to the  hospital and CT surgery was consulted for chest tube management. Chest tube this AM is without an air leak. She admits to pain around the chest tube and around her back but denies shortness of breath or chest pain.   Current Activity/ Functional Status: Patient is independent with mobility/ambulation, transfers, ADL's, IADL's.   Zubrod Score: At the time of surgery this patient's most appropriate activity status/level should be described as: []     0    Normal activity, no symptoms [x]     1    Restricted in physical strenuous activity but ambulatory, able to do out light work []     2    Ambulatory and capable of self care, unable to do work activities, up and about                 more than 50%  Of the time                            []     3    Only limited self care, in bed greater than 50% of waking hours []     4    Completely disabled, no self care, confined to bed or chair []     5    Moribund  Past Medical History:  Diagnosis Date  Allergy 1973   Seasonal   Arthritis    Blood transfusion without reported diagnosis 08/15/2008   Following hip replacement   Cancer (HCC)    hx of skin cancer    Cataract 1998   Cataract surgery both eyes   Colon polyps    adenomatous   Complication of anesthesia    " pt stated they gave me too much - kept having to be reminded to breathe    Constipation    past hx- no longer an issue    Depression    hx of    Diverticulosis    DIVERTICULOSIS, COLON 01/13/2009   Glaucoma 05-12-14   Headache    history of migraines   Heart murmur    benign, dagnosed around age 87,prophylactic antibiotic   Low back pain    Lumbar spondylolysis    Osteopenia    Pneumonia    hx of as a baby    Pulmonary hypertension (HCC)    monitored by cards Dr Allyson Sabal annually via echo    PVC's (premature ventricular contractions)    i have them periodically , Dr Allyson Sabal had me cut back on caffeine and that helped    Rectocele    Sleep apnea    wears bi-pap, no oxygen     Tubular adenoma of colon    Wears hearing aid in both ears     Past Surgical History:  Procedure Laterality Date   ABDOMINAL HERNIA REPAIR Right    incisional   ABDOMINAL HYSTERECTOMY     with ovaries   BACK SURGERY  2009   L4-L5 with rods   BIOPSY  11/13/2018   Procedure: BIOPSY;  Surgeon: Beverley Fiedler, MD;  Location: [facility];  Service: Gastroenterology;;   BLADDER SUSPENSION N/A 04/09/2017   Procedure: TRANSVAGINAL TAPE (TVT) PROCEDURE;  Surgeon: Carrington Clamp, MD;  Location: [facility];  Service: Gynecology;  Laterality: N/A;   CATARACT EXTRACTION, BILATERAL     cataract left lens replacement     2016   COLON SURGERY  2007   Rpr abscessed ruptured diverticuli   COLONOSCOPY     COLONOSCOPY N/A 08/29/2017   Procedure: COLONOSCOPY;  Surgeon: Beverley Fiedler, MD;  Location: [facility];  Service: Gastroenterology;  Laterality: N/A;   COLONOSCOPY WITH PROPOFOL N/A 11/13/2018   Procedure: COLONOSCOPY WITH PROPOFOL with Methylane Blue Injection;  Surgeon: Beverley Fiedler, MD;  Location: [facility];  Service: Gastroenterology;  Laterality: N/A;   CYSTOCELE REPAIR N/A 04/09/2017   Procedure: ANTERIOR REPAIR (CYSTOCELE);  Surgeon: Carrington Clamp, MD;  Location: [facility];  Service: Gynecology;  Laterality: N/A;   CYSTOSCOPY N/A 04/09/2017   Procedure: CYSTOSCOPY;  Surgeon: Carrington Clamp, MD;  Location: [facility];  Service: Gynecology;  Laterality: N/A;   EYE MUSCLE SURGERY     FRACTURE SURGERY  11-13-17   L wrist-open reduction internal fixation   GLAUCOMA SURGERY Left 05/12/14   HERNIA REPAIR  05-28-11   Hernia result of 2007 colon surg   incisional ab  05/28/11   JOINT REPLACEMENT     right hip replacement    LAPAROSCOPIC RIGHT HEMI COLECTOMY Right 04/30/2019   Procedure: LAPAROSCOPIC  RIGHT HEMI COLECTOMY;  Surgeon: Andria Meuse, MD;  Location: [facility];  Service: General;  Laterality: Right;   LUMBAR FUSION     L4-L5   PARTIAL KNEE ARTHROPLASTY Right    PARTIAL KNEE  ARTHROPLASTY Left    perforated deverticular abscess/laporotomy and colostomy     depression after this  POLYPECTOMY N/A 08/29/2017   Procedure: POLYPECTOMY;  Surgeon: Beverley Fiedler, MD;  Location: [facility];  Service: Gastroenterology;  Laterality: N/A;   POLYPECTOMY  11/13/2018   Procedure: POLYPECTOMY;  Surgeon: Beverley Fiedler, MD;  Location: [facility];  Service: Gastroenterology;;   POLYPECTOMY     RECTOCELE REPAIR  01/13/2020   RIGHT/LEFT HEART CATH AND CORONARY ANGIOGRAPHY N/A 02/13/2023   Procedure: RIGHT/LEFT HEART CATH AND CORONARY ANGIOGRAPHY;  Surgeon: Tonny Bollman, MD;  Location: [facility] CV [facility];  Service: Cardiovascular;  Laterality: N/A;   SPINE SURGERY  02-16-08   Fusion L4L5   stribismus eye surgery Right    take down of colostomy     TEE WITHOUT CARDIOVERSION N/A 02/13/2023   Procedure: TRANSESOPHAGEAL ECHOCARDIOGRAM;  Surgeon: Wendall Stade, MD;  Location: [facility] INVASIVE CV LAB;  Service: Cardiovascular;  Laterality: N/A;   tonsillectomly     TONSILLECTOMY     TOTAL HIP ARTHROPLASTY Right    TOTAL KNEE REVISION Left 11/24/2015   Procedure: Conversion from left lateral unicompartmental knee arthroplasty to left total knee arthroplasty;  Surgeon: Kathryne Hitch, MD;  Location: [facility];  Service: Orthopedics;  Laterality: Left;   TUBAL LIGATION  1976   WRIST SURGERY Right 03/27/2020    Social History   Tobacco Use  Smoking Status Never  Smokeless Tobacco Never    Social History   Substance and Sexual Activity  Alcohol Use Yes   Alcohol/week: 7.0 standard drinks of alcohol   Types: 7 Glasses of wine per week   Comment: 1 glass wine with dinner most evenings     Allergies  Allergen Reactions   Crab [Shellfish Allergy] Swelling and Other (See Comments)    Soft-shelled crab   Doxycycline Rash    Current Facility-Administered Medications  Medication Dose Route Frequency Provider Last Rate Last Admin   acetaminophen (TYLENOL) tablet 650 mg  650  mg Oral Q6H PRN Gwenevere Abbot, MD       Or   acetaminophen (TYLENOL) suppository 650 mg  650 mg Rectal Q6H PRN Gwenevere Abbot, MD       aspirin-acetaminophen-caffeine [facility] MIGRAINE) per tablet 2 tablet  2 tablet Oral Q6H PRN Gwenevere Abbot, MD       cholecalciferol (VITAMIN D3) 25 MCG (1000 UNIT) tablet 2,000 Units  2,000 Units Oral QPM Gwenevere Abbot, MD   2,000 Units at 07/17/23 1800   cycloSPORINE (RESTASIS) 0.05 % ophthalmic emulsion 1 drop  1 drop Both Eyes BID Gwenevere Abbot, MD   1 drop at 07/18/23 0832   enoxaparin (LOVENOX) injection 40 mg  40 mg Subcutaneous Q24H Dickie La, MD   40 mg at 07/17/23 2256   estradiol (CLIMARA - Dosed in mg/24 hr) patch 0.1 mg  0.1 mg Transdermal Once per day on Monday Friday Gwenevere Abbot, MD   0.1 mg at 07/18/23 0832   fluticasone (FLONASE) 50 MCG/ACT nasal spray 1 spray  1 spray Each Nare Daily Gwenevere Abbot, MD   1 spray at 07/18/23 0831   loratadine (CLARITIN) tablet 10 mg  10 mg Oral Daily Gwenevere Abbot, MD   10 mg at 07/18/23 4098   morphine (PF) 4 MG/ML injection 4 mg  4 mg Intravenous Q4H PRN Gwyneth Sprout, MD   4 mg at 07/18/23 0404   polyvinyl alcohol (LIQUIFILM TEARS) 1.4 % ophthalmic solution 1 drop  1 drop Both Eyes TID PRN Gwenevere Abbot, MD       pregabalin (LYRICA) capsule 25 mg  25 mg Oral BID  Gwenevere Abbot, MD   25 mg at 07/18/23 8295   senna-docusate (Senokot-S) tablet 1 tablet  1 tablet Oral QHS PRN Gwenevere Abbot, MD       sodium chloride flush (NS) 0.9 % injection 10 mL  10 mL Intravenous Q12H Gwenevere Abbot, MD   10 mL at 07/18/23 0838   sodium chloride flush (NS) 0.9 % injection 3 mL  3 mL Intravenous Q12H Gwenevere Abbot, MD       sodium chloride flush (NS) 0.9 % injection 3 mL  3 mL Intravenous PRN Gwenevere Abbot, MD   3 mL at 07/17/23 2256   tiZANidine (ZANAFLEX) tablet 2 mg  2 mg Oral QHS Gwenevere Abbot, MD   2 mg at 07/17/23 2249   traMADol (ULTRAM) tablet 25-50 mg  25-50 mg Oral Daily PRN Gwenevere Abbot, MD   50 mg at 07/18/23 6213     Facility-Administered Medications Prior to Admission  Medication Dose Route Frequency Provider Last Rate Last Admin   0.9 %  sodium chloride infusion  500 mL Intravenous Once Pyrtle, Carie Caddy, MD       Medications Prior to Admission  Medication Sig Dispense Refill Last Dose   amoxicillin (AMOXIL) 500 MG capsule Take 2,000 mg by mouth See admin instructions. Take 2,000 mg 1 hour prior to dental work   unknown   ascorbic acid (VITAMIN C) 1000 MG tablet Take 1,000 mg by mouth daily.   07/16/2023   aspirin-acetaminophen-caffeine (EXCEDRIN MIGRAINE) 250-250-65 MG tablet Take 2 tablets by mouth every 6 (six) hours as needed for headache.    07/16/2023   Biotin 5000 MCG CAPS Take 5,000 mcg by mouth daily.   07/16/2023   calcium carbonate (TUMS EX) 750 MG chewable tablet Chew 1 tablet by mouth daily as needed for heartburn.   07/16/2023   carboxymethylcellulose (REFRESH PLUS) 0.5 % SOLN Place 1 drop into both eyes 3 (three) times daily as needed (Dry eyes).   07/16/2023   Cholecalciferol (VITAMIN D3) 50 MCG (2000 UT) TABS Take 2,000 Units by mouth every evening.   07/16/2023   clobetasol (OLUX) 0.05 % topical foam Apply 1 Application topically once a week.   Past Week   clobetasol (TEMOVATE) 0.05 % external solution Apply 1 Application topically every 7 (seven) days.   Past Week   cycloSPORINE (RESTASIS) 0.05 % ophthalmic emulsion Place 1 drop into both eyes 2 (two) times daily.    07/16/2023   DULoxetine (CYMBALTA) 60 MG capsule Take 60 mg by mouth daily.   07/16/2023   estradiol (VIVELLE-DOT) 0.1 MG/24HR patch Place 1 patch onto the skin See admin instructions. Apply a new patch to the skin on Mondays and Fridays   07/14/2023   EVENING PRIMROSE OIL PO Take 1,300 mg by mouth 2 (two) times daily.   07/16/2023   fexofenadine (ALLEGRA) 180 MG tablet Take 180 mg by mouth at bedtime.   07/16/2023   fluocinonide (LIDEX) 0.05 % external solution Apply 1 application  topically once a week. Applied to scalp  after shampooing   Past Week   fluticasone (FLONASE) 50 MCG/ACT nasal spray SPRAY 2 SPRAYS INTO EACH NOSTRIL EVERY DAY (Patient taking differently: Place 1 spray into both nostrils daily.) 48 mL 1 07/16/2023   Lutein 20 MG TABS Take 20 mg by mouth 2 (two) times daily.    07/16/2023   Multiple Minerals-Vitamins (ADVANCED CALCIUM/D/MAGNESIUM) TABS Take 1 tablet by mouth 2 (two) times daily. includes boron and betaine   07/16/2023   Multiple Vitamin (  MULTIVITAMIN WITH MINERALS) TABS tablet Take 1 tablet by mouth daily.   07/16/2023   Multiple Vitamins-Minerals (PRESERVISION AREDS 2 PO) Take 1 tablet by mouth 2 (two) times daily.   07/16/2023   olopatadine (PATANOL) 0.1 % ophthalmic solution INSTILL 1 DROP INTO BOTH EYES TWICE A DAY (Patient taking differently: Place 1 drop into both eyes daily as needed for allergies.) 15 mL 3 Past Week   Omega 3 1000 MG CAPS Take 1,000 mg by mouth 2 (two) times daily.   07/16/2023   polyethylene glycol powder (GLYCOLAX/MIRALAX) powder Take 17 g by mouth daily.    07/16/2023   pregabalin (LYRICA) 25 MG capsule Take 1 capsule (25 mg total) by mouth 2 (two) times daily. TAKE 1 CAPSULE IN THE MORNING AND TAKE 1 CAPSULE IN THE EVENING (Patient taking differently: Take 25-50 mg by mouth See admin instructions. Take 25 mg by mouth in the morning and 50 mg at bedtime) 180 capsule 1 07/16/2023 at pm   PRESCRIPTION MEDICATION Apply 1 Application topically at bedtime. Azelaic acid 15%, metronidazole 1%, ivermectin 1%   Past Week   tiZANidine (ZANAFLEX) 2 MG tablet TAKE 1 TABLET (2 MG TOTAL) BY MOUTH AT BEDTIME AS NEEDED. (Patient taking differently: Take 2 mg by mouth at bedtime.) 90 tablet 1 07/16/2023   betamethasone, augmented, (DIPROLENE) 0.05 % lotion Apply 1 Application topically as needed (itchy ears).      traMADol (ULTRAM) 50 MG tablet Take 25-50 mg by mouth daily as needed (for pain). (Patient not taking: Reported on 07/17/2023)   Not Taking    Family History  Problem  Relation Age of Onset   Alzheimer's disease Father    Heart disease Father        mitral valve replaced. unknown reason   Breast cancer Mother        63   Colon polyps Mother    Diabetes Mother    Heart disease Mother 78       CHF, MI 47   Heart attack Mother        x 2   Arthritis Mother    Cancer Mother    Hearing loss Mother    Hypertension Mother    Prostate cancer Brother    Cancer Brother    Hearing loss Brother    Hypertension Brother    Asthma Paternal Uncle    Hypertension Sister    Colon cancer Neg Hx    Esophageal cancer Neg Hx    Rectal cancer Neg Hx    Stomach cancer Neg Hx      Review of Systems:   Review of Systems  Constitutional:  Positive for malaise/fatigue. Negative for chills, fever and weight loss.  HENT:  Negative for hearing loss.   Eyes:  Negative for blurred vision.  Respiratory:  Positive for cough and shortness of breath. Negative for sputum production and wheezing.   Cardiovascular:  Positive for palpitations. Negative for chest pain, orthopnea and leg swelling.  Gastrointestinal:  Negative for nausea and vomiting.  Genitourinary:  Negative for dysuria.  Neurological:  Positive for dizziness. Negative for loss of consciousness, weakness and headaches.  Endo/Heme/Allergies:  Does not bruise/bleed easily.  Psychiatric/Behavioral:  Negative for suicidal ideas. The patient is not nervous/anxious.   Last dental visit was 6 months ago      Physical Exam: BP 131/78 (BP Location: Right Arm)   Pulse 71   Temp (!) 97.5 F (36.4 C) (Oral)   Resp 20   Ht 5\' 1"  (  1.549 m)   Wt 65.9 kg   SpO2 96%   BMI 27.45 kg/m   General appearance: alert, cooperative, and no distress Head: Normocephalic, without obvious abnormality, atraumatic Neck: no adenopathy, no carotid bruit, no JVD, supple, symmetrical, trachea midline, and thyroid not enlarged, symmetric, no tenderness/mass/nodules Lymph nodes: Cervical, supraclavicular, and axillary nodes  normal. Resp: clear to auscultation bilaterally Cardio: regular rate and rhythm, S1, S2 normal, no murmur, click, rub or gallop GI: soft, non-tender; bowel sounds normal; no masses,  no organomegaly Extremities: 1+ edema bilateral lower extremities Neurologic: Grossly normal Mouth: looks like some dental carries  Diagnostic Studies & Radiology Findings:  CLINICAL DATA:  Shortness of breath and palpitations   EXAM: PORTABLE CHEST 1 VIEW   COMPARISON:  CTA chest 02/08/2042 p.m. on 07/17/2023 subsequent radiograph 2:36 p.m. on 10/17   FINDINGS: Large right pneumothorax without definite mediastinal shift. On subsequent radiographic 2:36 p.m. after chest placement this has resolved.   Atelectasis or infiltrates lower lungs no pleural effusion. Cardiomegaly. Aortic atherosclerotic calcification. No displaced rib fractures.   IMPRESSION: Large right pneumothorax without definite mediastinal shift. On subsequent radiographic 2:36 p.m. after chest placement, this has resolved.     Electronically Signed   By: Minerva Fester M.D.   On: 07/17/2023 15:05  Assessment & Plan: Right sided pneumothorax: CT in place to water seal. Pneumothorax has resolved. Since this is her first spontaneous pneumothorax will continue conservative management with the chest tube. Continue chest tube to water seal for today. New Afib/flutter with RVR: Now in NSR-SB after dose of Lopressor Mitral valve regurgitation and tricuspid valve regurgitation: Was not a surgical candidate in May but with new onset afib/flutter and worsening SOB may be a surgical candidate as stated in Dr. Karolee Ohs surgical consult note. Will discuss this with him.  OSA: Uses BiPAP at night PHTN: Was not seen on last catheterization  Tanya Reichmann, PA-C 07/18/23  Agree with above 82yo female with a right spontaneous pneumothorax Will clamp chest tomorrow for 24hrs.  If stable, will remove the chest tube on 10/20  Tanya Leon  Tanya Leon

## 2023-07-18 NOTE — Consult Note (Signed)
NAME:  Tanya Leon, MRN:  [id number], DOB:  11-08-1940, LOS: 1 ADMISSION DATE:  07/17/2023, CONSULTATION DATE:  07/18/2023 REFERRING MD:  Dickie La, MD, CHIEF COMPLAINT:  Pneumothorax   History of Present Illness:   Patient is a pleasant 82 year old female with a past medical history of OSA on BiPAP as well as MR and TR who presents to the hospital with increased shortness of breath.    Patient developed acute onset back pain with shortness of breath on 07/17/2023 prompting her to present to the emergency department.  On presentation, she was found to have a large right-sided pneumothorax prompting chest tube placement for evacuation.  She was admitted to the medical service and has been followed by cardiothoracic surgery for management of her pneumothorax.  Patient reports nothing out of the ordinary prior to this presentation.  She denies any air travel, scuba diving or change in her BiPAP settings (reports stable settings for the past 3 years).  She also denies any trauma to the chest or any motor vehicle accident.  She has not had any procedural interventions recently.  She is followed by [facility] pulmonology and is maintained on noninvasive positive pressure ventilation for the management of OSA and possible pulmonary hypertension.  Per documentation in care everywhere, she is on BiPAP 19/15.  Chest CT from 2021 is read as having pleural and parenchymal scarring along the right heart border as well as minimal bronchiectasis in the right middle lobe likely to be postinfectious in nature.  Today, patient feels she is in her usual state of health.  She does not have any chest pain, chest tightness, or increased shortness of breath.   Pertinent  Medical History   -OSA on BiPAP -Mitral regurgitation -Tricuspid regurgitation  Right heart cath from May 2024  RA 4 RV 25/5 PA 26/7 mean 16 PCWP 6 LV 134/17 Ao 135/69  Significant Hospital Events: Including procedures, antibiotic  start and stop dates in addition to other pertinent events   10/17: admission, chest tube placed 10/18: pulmonary consult   Objective   Blood pressure 115/67, pulse 62, temperature 97.6 F (36.4 C), temperature source Oral, resp. rate 15, height 5\' 1"  (1.549 m), weight 65.9 kg, SpO2 97%.        Intake/Output Summary (Last 24 hours) at 07/18/2023 1937 Last data filed at 07/18/2023 1853 Gross per 24 hour  Intake 240 ml  Output --  Net 240 ml   Filed Weights   07/17/23 1126 07/18/23 0355  Weight: 65.8 kg 65.9 kg    Examination: Physical Exam Constitutional:      Appearance: Normal appearance.  Cardiovascular:     Rate and Rhythm: Normal rate and regular rhythm.     Pulses: Normal pulses.     Heart sounds: Normal heart sounds.  Pulmonary:     Breath sounds: Rales (over the right base) present.  Neurological:     Mental Status: She is alert.      Assessment & Plan:   #Pneumothorax #OSA  Presenting with a right sided pneumothorax that is likely primary spontaneous, though the chest CT with her collapsed lung limits our ability to evaluate the underlying lung parenchyma. I have reviewed a chest CT from 2021 and don't note any obvious lesions that could result in a pneumothorax. No other findings on history that suggest an etiology behind her pneumothorax aside from BiPAP with positive pressure and the potential for baro-trauma.  On exam today, there is no tidal movements in collection  chamber with no air-leak noted on passive and forced exhalation. The lung is fully re-expanded on repeat imaging with the chest tube in proper position. This suggests healing of the pleural tear that could have resulted in the pneumothorax. I would recommend clamping the chest tube with serial CXR's to ensure stability, prior to discontinuation. I would also recommend repeat chest CT with the re-inflated lung to better evaluate the parenchyma for any abnormality that could be the source of the  tear/leak especially given findings of rales over the right base.  As to the question of BiPAP, she is on quite elevated settings with 19/15. She does not have pulmonary hypertension as evidenced by her recent right heart cath, and this was initiated for the management of OSA with symptoms of fatigue and sleepiness. Given this, it would be prudent to hold BiPAP until the workup has been finalized, and to allow the parenchymal/visceral pleural tear to fully heal. I would favor at least holding BiPAP for 2 weeks, preferably up to 4 given lack of urgency for its use.  Raechel Chute, MD [facility]   Labs   CBC: Recent Labs  Lab 07/17/23 1145 07/17/23 1151 07/18/23 0518  WBC 5.5  --  8.9  NEUTROABS 3.8  --   --   HGB 13.7 14.3 12.7  HCT 42.3 42.0 39.4  MCV 92.8  --  93.4  PLT 155  --  142*    Basic Metabolic Panel: Recent Labs  Lab 07/17/23 1145 07/17/23 1151 07/18/23 0518  NA 136 139 138  K 4.4 4.4 3.8  CL 106 105 102  CO2 22  --  25  GLUCOSE 98 97 96  BUN 16 21 17   CREATININE 0.77 0.80 0.84  CALCIUM 8.6*  --  9.2  MG 2.1  --   --    GFR: Estimated Creatinine Clearance: 44.1 mL/min (by C-G formula based on SCr of 0.84 mg/dL). Recent Labs  Lab 07/17/23 1145 07/18/23 0518  WBC 5.5 8.9    Liver Function Tests: Recent Labs  Lab 07/17/23 1145  AST 30  ALT 21  ALKPHOS 48  BILITOT 1.0  PROT 6.1*  ALBUMIN 3.7   No results for input(s): "LIPASE", "AMYLASE" in the last 168 hours. No results for input(s): "AMMONIA" in the last 168 hours.  ABG    Component Value Date/Time   PHART 7.324 (L) 02/13/2023 1544   PCO2ART 41.5 02/13/2023 1544   PO2ART 49 (L) 02/13/2023 1544   HCO3 21.6 02/13/2023 1544   TCO2 24 07/17/2023 1151   ACIDBASEDEF 4.0 (H) 02/13/2023 1544   O2SAT 81 02/13/2023 1544     Coagulation Profile: No results for input(s): "INR", "PROTIME" in the last 168 hours.  Cardiac Enzymes: No results for input(s): "CKTOTAL", "CKMB",  "CKMBINDEX", "TROPONINI" in the last 168 hours.  HbA1C: Hgb A1c MFr Bld  Date/Time Value Ref Range Status  04/27/2019 09:38 AM 5.0 4.8 - 5.6 % Final    Comment:    (NOTE) Pre diabetes:          5.7%-6.4% Diabetes:              >6.4% Glycemic control for   <7.0% adults with diabetes     CBG: No results for input(s): "GLUCAP" in the last 168 hours.  Review of Systems:   Review of Systems  Constitutional:  Negative for chills and fever.  Respiratory:  Negative for cough, hemoptysis, sputum production, shortness of breath and wheezing.   Cardiovascular:  Negative for chest pain and palpitations.     Past Medical History:  She,  has a past medical history of Allergy (1973), Arthritis, Blood transfusion without reported diagnosis (08/15/2008), Cancer (HCC), Cataract (1998), Colon polyps, Complication of anesthesia, Constipation, Depression, Diverticulosis, DIVERTICULOSIS, COLON (01/13/2009), Glaucoma (05-12-14), Headache, Heart murmur, Low back pain, Lumbar spondylolysis, Osteopenia, Pneumonia, Pulmonary hypertension (HCC), PVC's (premature ventricular contractions), Rectocele, Sleep apnea, Tubular adenoma of colon, and Wears hearing aid in both ears.   Surgical History:   Past Surgical History:  Procedure Laterality Date   ABDOMINAL HERNIA REPAIR Right    incisional   ABDOMINAL HYSTERECTOMY     with ovaries   BACK SURGERY  2009   L4-L5 with rods   BIOPSY  11/13/2018   Procedure: BIOPSY;  Surgeon: Beverley Fiedler, MD;  Location: [facility];  Service: Gastroenterology;;   BLADDER SUSPENSION N/A 04/09/2017   Procedure: TRANSVAGINAL TAPE (TVT) PROCEDURE;  Surgeon: Carrington Clamp, MD;  Location: [facility];  Service: Gynecology;  Laterality: N/A;   CATARACT EXTRACTION, BILATERAL     cataract left lens replacement     2016   COLON SURGERY  2007   Rpr abscessed ruptured diverticuli   COLONOSCOPY     COLONOSCOPY N/A 08/29/2017   Procedure: COLONOSCOPY;  Surgeon: Beverley Fiedler, MD;   Location: [facility];  Service: Gastroenterology;  Laterality: N/A;   COLONOSCOPY WITH PROPOFOL N/A 11/13/2018   Procedure: COLONOSCOPY WITH PROPOFOL with Methylane Blue Injection;  Surgeon: Beverley Fiedler, MD;  Location: [facility];  Service: Gastroenterology;  Laterality: N/A;   CYSTOCELE REPAIR N/A 04/09/2017   Procedure: ANTERIOR REPAIR (CYSTOCELE);  Surgeon: Carrington Clamp, MD;  Location: [facility];  Service: Gynecology;  Laterality: N/A;   CYSTOSCOPY N/A 04/09/2017   Procedure: CYSTOSCOPY;  Surgeon: Carrington Clamp, MD;  Location: [facility];  Service: Gynecology;  Laterality: N/A;   EYE MUSCLE SURGERY     FRACTURE SURGERY  11-13-17   L wrist-open reduction internal fixation   GLAUCOMA SURGERY Left 05/12/14   HERNIA REPAIR  05-28-11   Hernia result of 2007 colon surg   incisional ab  05/28/11   JOINT REPLACEMENT     right hip replacement    LAPAROSCOPIC RIGHT HEMI COLECTOMY Right 04/30/2019   Procedure: LAPAROSCOPIC  RIGHT HEMI COLECTOMY;  Surgeon: Andria Meuse, MD;  Location: [facility];  Service: General;  Laterality: Right;   LUMBAR FUSION     L4-L5   PARTIAL KNEE ARTHROPLASTY Right    PARTIAL KNEE ARTHROPLASTY Left    perforated deverticular abscess/laporotomy and colostomy     depression after this   POLYPECTOMY N/A 08/29/2017   Procedure: POLYPECTOMY;  Surgeon: Beverley Fiedler, MD;  Location: [facility];  Service: Gastroenterology;  Laterality: N/A;   POLYPECTOMY  11/13/2018   Procedure: POLYPECTOMY;  Surgeon: Beverley Fiedler, MD;  Location: [facility];  Service: Gastroenterology;;   POLYPECTOMY     RECTOCELE REPAIR  01/13/2020   RIGHT/LEFT HEART CATH AND CORONARY ANGIOGRAPHY N/A 02/13/2023   Procedure: RIGHT/LEFT HEART CATH AND CORONARY ANGIOGRAPHY;  Surgeon: Tonny Bollman, MD;  Location: [facility];  Service: Cardiovascular;  Laterality: N/A;   SPINE SURGERY  02-16-08   Fusion L4L5   stribismus eye surgery Right    take down of colostomy     TEE WITHOUT  CARDIOVERSION N/A 02/13/2023   Procedure: TRANSESOPHAGEAL ECHOCARDIOGRAM;  Surgeon: Wendall Stade, MD;  Location: [facility] INVASIVE CV LAB;  Service: Cardiovascular;  Laterality: N/A;   tonsillectomly  TONSILLECTOMY     TOTAL HIP ARTHROPLASTY Right    TOTAL KNEE REVISION Left 11/24/2015   Procedure: Conversion from left lateral unicompartmental knee arthroplasty to left total knee arthroplasty;  Surgeon: Kathryne Hitch, MD;  Location: [facility];  Service: Orthopedics;  Laterality: Left;   TUBAL LIGATION  1976   WRIST SURGERY Right 03/27/2020     Social History:   reports that she has never smoked. She has never used smokeless tobacco. She reports current alcohol use of about 7.0 standard drinks of alcohol per week. She reports that she does not use drugs.   Family History:  Her family history includes Alzheimer's disease in her father; Arthritis in her mother; Asthma in her paternal uncle; Breast cancer in her mother; Cancer in her brother and mother; Colon polyps in her mother; Diabetes in her mother; Hearing loss in her brother and mother; Heart attack in her mother; Heart disease in her father; Heart disease (age of onset: 69) in her mother; Hypertension in her brother, mother, and sister; Prostate cancer in her brother. There is no history of Colon cancer, Esophageal cancer, Rectal cancer, or Stomach cancer.   Allergies Allergies  Allergen Reactions   Crab [Shellfish Allergy] Swelling and Other (See Comments)    Soft-shelled crab   Doxycycline Rash     Home Medications  Prior to Admission medications   Medication Sig Start Date End Date Taking? Authorizing Provider  amoxicillin (AMOXIL) 500 MG capsule Take 2,000 mg by mouth See admin instructions. Take 2,000 mg 1 hour prior to dental work 12/21/19  Yes [provider]  ascorbic acid (VITAMIN C) 1000 MG tablet Take 1,000 mg by mouth daily. 12/11/11  Yes [provider]  aspirin-acetaminophen-caffeine (EXCEDRIN  MIGRAINE) [phone] MG tablet Take 2 tablets by mouth every 6 (six) hours as needed for headache.    Yes [provider]  Biotin 5000 MCG CAPS Take 5,000 mcg by mouth daily.   Yes [provider]  calcium carbonate (TUMS EX) 750 MG chewable tablet Chew 1 tablet by mouth daily as needed for heartburn.   Yes [provider]  carboxymethylcellulose (REFRESH PLUS) 0.5 % SOLN Place 1 drop into both eyes 3 (three) times daily as needed (Dry eyes).   Yes [provider]  Cholecalciferol (VITAMIN D3) 50 MCG (2000 UT) TABS Take 2,000 Units by mouth every evening.   Yes [provider]  clobetasol (OLUX) 0.05 % topical foam Apply 1 Application topically once a week.   Yes [provider]  clobetasol (TEMOVATE) 0.05 % external solution Apply 1 Application topically every 7 (seven) days.   Yes [provider]  cycloSPORINE (RESTASIS) 0.05 % ophthalmic emulsion Place 1 drop into both eyes 2 (two) times daily.    Yes [provider]  DULoxetine (CYMBALTA) 60 MG capsule Take 60 mg by mouth daily.   Yes [provider]  estradiol (VIVELLE-DOT) 0.1 MG/24HR patch Place 1 patch onto the skin See admin instructions. Apply a new patch to the skin on Mondays and Fridays   Yes [provider]  EVENING PRIMROSE OIL PO Take 1,300 mg by mouth 2 (two) times daily.   Yes [provider]  fexofenadine (ALLEGRA) 180 MG tablet Take 180 mg by mouth at bedtime.   Yes [provider]  fluocinonide (LIDEX) 0.05 % external solution Apply 1 application  topically once a week. Applied to scalp after shampooing   Yes [provider]  fluticasone (FLONASE) 50 MCG/ACT nasal spray  SPRAY 2 SPRAYS INTO EACH NOSTRIL EVERY DAY Patient taking differently: Place 1 spray into both nostrils daily. 05/18/20  Yes Shelva Majestic, MD  Lutein 20 MG TABS Take 20 mg by mouth 2 (two) times daily.    Yes [provider]  Multiple  Minerals-Vitamins (ADVANCED CALCIUM/D/MAGNESIUM) TABS Take 1 tablet by mouth 2 (two) times daily. includes boron and betaine   Yes [provider]  Multiple Vitamin (MULTIVITAMIN WITH MINERALS) TABS tablet Take 1 tablet by mouth daily.   Yes [provider]  Multiple Vitamins-Minerals (PRESERVISION AREDS 2 PO) Take 1 tablet by mouth 2 (two) times daily.   Yes [provider]  olopatadine (PATANOL) 0.1 % ophthalmic solution INSTILL 1 DROP INTO BOTH EYES TWICE A DAY Patient taking differently: Place 1 drop into both eyes daily as needed for allergies. 09/18/20  Yes Shelva Majestic, MD  Omega 3 1000 MG CAPS Take 1,000 mg by mouth 2 (two) times daily.   Yes [provider]  polyethylene glycol powder (GLYCOLAX/MIRALAX) powder Take 17 g by mouth daily.    Yes [provider]  pregabalin (LYRICA) 25 MG capsule Take 1 capsule (25 mg total) by mouth 2 (two) times daily. TAKE 1 CAPSULE IN THE MORNING AND TAKE 1 CAPSULE IN THE EVENING Patient taking differently: Take 25-50 mg by mouth See admin instructions. Take 25 mg by mouth in the morning and 50 mg at bedtime 01/26/19  Yes Shelva Majestic, MD  PRESCRIPTION MEDICATION Apply 1 Application topically at bedtime. Azelaic acid 15%, metronidazole 1%, ivermectin 1%   Yes [provider]  tiZANidine (ZANAFLEX) 2 MG tablet TAKE 1 TABLET (2 MG TOTAL) BY MOUTH AT BEDTIME AS NEEDED. Patient taking differently: Take 2 mg by mouth at bedtime. 05/18/20  Yes Shelva Majestic, MD  betamethasone, augmented, (DIPROLENE) 0.05 % lotion Apply 1 Application topically as needed (itchy ears). 11/29/22   [provider]  traMADol (ULTRAM) 50 MG tablet Take 25-50 mg by mouth daily as needed (for pain). Patient not taking: Reported on 07/17/2023    [provider]    I spent 60 minutes caring for this patient today, including preparing to see the patient, obtaining a medical history , reviewing a separately  obtained history, performing a medically appropriate examination and/or evaluation, counseling and educating the patient/family/caregiver, referring and communicating with other health care professionals (not separately reported), and documenting clinical information in the electronic health record

## 2023-07-18 NOTE — Care Management Important Message (Signed)
Important Message  Patient Details  Name: Tanya Leon MRN: [id number] Date of Birth: [birth year]   Important Message Given:  Yes - Medicare IM     [name] 07/18/2023, 2:02 PM

## 2023-07-18 NOTE — Plan of Care (Signed)
[facility] Tonsillectomy/Adenoidectomy, Postoperative PEDS care plan entered in error.

## 2023-07-18 NOTE — Progress Notes (Signed)
Mobility Specialist Progress Note:   07/18/23 1449  Mobility  Activity Ambulated with assistance to bathroom  Level of Assistance Standby assist, set-up cues, supervision of patient - no hands on  Assistive Device None  Distance Ambulated (ft) 15 ft  Activity Response Tolerated well  Mobility Referral Yes  $Mobility charge 1 Mobility  Mobility Specialist Start Time (ACUTE ONLY) 1440  Mobility Specialist Stop Time (ACUTE ONLY) 1449  Mobility Specialist Time Calculation (min) (ACUTE ONLY) 9 min    Pt received in chair, requesting assistance to BR. Asymptomatic throughout. Pt reminded to use call bell when ready.   Leory Plowman  Mobility Specialist Please contact via [employer] office at [phone]

## 2023-07-19 ENCOUNTER — Inpatient Hospital Stay (HOSPITAL_COMMUNITY): Payer: Medicare Other

## 2023-07-19 DIAGNOSIS — J9311 Primary spontaneous pneumothorax: Secondary | ICD-10-CM | POA: Diagnosis not present

## 2023-07-19 MED ORDER — OXYCODONE HCL 5 MG PO TABS
5.0000 mg | ORAL_TABLET | Freq: Once | ORAL | Status: DC | PRN
  Filled 2023-07-19: qty 1

## 2023-07-19 MED ORDER — DULOXETINE HCL 60 MG PO CPEP
60.0000 mg | ORAL_CAPSULE | Freq: Every day | ORAL | Status: DC
  Administered 2023-07-19 – 2023-07-21 (×3): 60 mg via ORAL
  Filled 2023-07-19 (×3): qty 1

## 2023-07-19 MED ORDER — PHENOL 1.4 % MT LIQD
1.0000 | OROMUCOSAL | Status: DC | PRN
  Filled 2023-07-19: qty 177

## 2023-07-19 NOTE — Progress Notes (Signed)
Pt ambulated x 470 feet around the unit pt sounds out of breath but saturations 91 on RA and states"its just my leaky Valve". Pt back to bed with bed alarm on. Pt chest tube remains clamped per md order. She is hoarse but no distress or subcutaneous air noted upon palpation

## 2023-07-19 NOTE — Progress Notes (Addendum)
   Subjective:  No acute concerns. States had some pain overnight but otherwise pain is well controlled.   Objective:   Vital signs in last 24 hours: Vitals:   07/19/23 0214 07/19/23 0500 07/19/23 0642 07/19/23 0733  BP:   126/74 130/77  Pulse: 77 (!) 25  62  Resp:  19  12  Temp: 97.8 F (36.6 C)   97.6 F (36.4 C)  TempSrc: Oral   Oral  SpO2:  (!) 86%  94%  Weight:  67.1 kg    Height:       Supplemental Oxygen: 4L Nasal Cannula at 98%  Physical Exam General: NAD, sitting in recliner HENT: NCAT Lungs:  CTAB, lungs sounds present in all lobes Cardiovascular: RRR, good radial pulse Abdomen: No TTP MSK: No asymmetry Skin: Chest tube area appears well without any drainage, clear bandage is in place Neuro: alert and oriented Psych: normal mood and normal affect  Assessment/Plan: Patient is an 82 year old female with PMH of arthritis, severe MR, OSA on Bipap, and depression who woke up with back pain, shortness of breath, and palpitations this morning. She is admitted for large pneumothorax of the right lung.   Large Right Pneumothorax without Mediastinal Shift Resolved. Plan to clamp tube today and remove tomorrow if pneumothorax does not re-develop. Appreciate CT surgery. Will continue current pain regimen and add one additional dose of oxycodone prn for increased pain.    New Onset Afib with RVR Atrial Flutter EKG 11:42 showed atrial flutter and LAFB. CHADS-VASC Score is 3 given age and her gender. Considered AC in this pt but her arrhythmia could be provoked by the pneumothorax. Patient remained in sinus rhythm overnight. Will continue to monitor telemetry.  -Will need OP cardiology follow up.    Obstructive Sleep Apnea Patient uses BiPAP machine at home. Currently on 2L [place] -Hold bipap for now. Pulm recommends holding for at least 4 weeks. -Continue supplemental oxygen as needed   Moderate Mitral Regurgitation Severe Tricuspid Regurgitation Patient is following Dr.  Allyson Sabal for Cardiology for her 4-5 year history of valvular disease. She endorses chronic dyspnea on exertion. Last Echo 01/2023. Will monitor for now.   Thrombocytopenia Platelet count at 142. -No plans for treatment at this time. Will continue to monitor. CBC pending.    Chronic Conditions: Arthritis-Tylenol and Tramadol PRN Depression- Home Cymbalta Post Menopausal Symptoms- Home Estrogen patch MF Muscle Spasms- Home Lyrica and Tizanidine Glaucoma- Home Eye Drops   Dispo: Possible for discharge tmw.    LOS: 2 days   Gwenevere Abbot, MD  07/19/2023, 7:33 AM

## 2023-07-19 NOTE — Plan of Care (Signed)
  Problem: Clinical Measurements: Goal: Diagnostic test results will improve Outcome: Progressing   Problem: Clinical Measurements: Goal: Respiratory complications will improve Outcome: Progressing   Problem: Clinical Measurements: Goal: Ability to maintain clinical measurements within normal limits will improve Outcome: Progressing   Problem: Education: Goal: Knowledge of General Education information will improve Description: Including pain rating scale, medication(s)/side effects and non-pharmacologic comfort measures Outcome: Progressing

## 2023-07-19 NOTE — Plan of Care (Signed)
  Problem: Education: Goal: Knowledge of General Education information will improve Description: Including pain rating scale, medication(s)/side effects and non-pharmacologic comfort measures Outcome: Progressing   Problem: Nutrition: Goal: Adequate nutrition will be maintained Outcome: Progressing   Problem: Coping: Goal: Level of anxiety will decrease Outcome: Progressing   

## 2023-07-19 NOTE — Plan of Care (Signed)
  Problem: Education: Goal: Knowledge of General Education information will improve Description: Including pain rating scale, medication(s)/side effects and non-pharmacologic comfort measures 07/19/2023 0135 by Burna Sis, RN Outcome: Progressing 07/19/2023 0012 by Burna Sis, RN Outcome: Progressing   Problem: Clinical Measurements: Goal: Ability to maintain clinical measurements within normal limits will improve 07/19/2023 0135 by Burna Sis, RN Outcome: Progressing 07/19/2023 0012 by Burna Sis, RN Outcome: Progressing   Problem: Clinical Measurements: Goal: Respiratory complications will improve 07/19/2023 0135 by Burna Sis, RN Outcome: Progressing 07/19/2023 0012 by Burna Sis, RN Outcome: Progressing   Problem: Clinical Measurements: Goal: Cardiovascular complication will be avoided 07/19/2023 0135 by Burna Sis, RN Outcome: Progressing 07/19/2023 0012 by Burna Sis, RN Outcome: Progressing   Problem: Coping: Goal: Level of anxiety will decrease 07/19/2023 0135 by Burna Sis, RN Outcome: Progressing 07/19/2023 0012 by Burna Sis, RN Outcome: Progressing

## 2023-07-19 NOTE — Progress Notes (Addendum)
      [address]       [place] [address]             [phone]         Subjective: Patient sitting in the chair eating breakfast. States she had pain last night but it is controlled this AM  Objective: Vital signs in last 24 hours: Temp:  [97.6 F (36.4 C)-98.2 F (36.8 C)] 97.6 F (36.4 C) (10/19 0733) Pulse Rate:  [25-77] 62 (10/19 0733) Cardiac Rhythm: Normal sinus rhythm (10/18 2020) Resp:  [12-20] 12 (10/19 0733) BP: (111-130)/(66-77) 130/77 (10/19 0733) SpO2:  [86 %-98 %] 94 % (10/19 0733) Weight:  [67.1 kg] 67.1 kg (10/19 0500)  Hemodynamic parameters for last 24 hours:    Intake/Output from previous day: 10/18 0701 - 10/19 0700 In: 240 [P.O.:240] Out: 47 [Stool:1; Chest Tube:46] Intake/Output this shift: No intake/output data recorded.  General appearance: alert, cooperative, and no distress Neurologic: intact Heart: regular rate and rhythm, S1, S2 normal, no murmur, click, rub or gallop Lungs: clear to auscultation bilaterally Abdomen: soft, non-tender; bowel sounds normal; no masses,  no organomegaly Extremities: extremities normal, atraumatic, no cyanosis or edema Wound: Dressing in place around chest tube  Lab Results: Recent Labs    07/17/23 1145 07/17/23 1151 07/18/23 0518  WBC 5.5  --  8.9  HGB 13.7 14.3 12.7  HCT 42.3 42.0 39.4  PLT 155  --  142*   BMET:  Recent Labs    07/17/23 1145 07/17/23 1151 07/18/23 0518  NA 136 139 138  K 4.4 4.4 3.8  CL 106 105 102  CO2 22  --  25  GLUCOSE 98 97 96  BUN 16 21 17   CREATININE 0.77 0.80 0.84  CALCIUM 8.6*  --  9.2    PT/INR: No results for input(s): "LABPROT", "INR" in the last 72 hours. ABG    Component Value Date/Time   PHART 7.324 (L) 02/13/2023 1544   HCO3 21.6 02/13/2023 1544   TCO2 24 07/17/2023 1151   ACIDBASEDEF 4.0 (H) 02/13/2023 1544   O2SAT 81 02/13/2023 1544   CBG (last 3)  No results for input(s): "GLUCAP" in the last 72  hours.  Assessment/Plan: S/P   Neuro: Some pain overnight, now controlled. Pain control per primary service  CV: Stable vital signs. NSR. BP controlled.  Pulm: Saturating well on 4L [place]. CXR read pending but looks like possibly a small right apical pneumothorax. CT output 46cc/24hrs. No air leak with multiple coughs. Encourage ambulation.   Dispo: Plan to clamp the chest tube and get follow up CXR in the AM.    LOS: 2 days    Jenny Reichmann, PA-C 07/19/2023  Patient seen and examined, agree with above Tube clamped tolerating so far  Viviann Spare C. Dorris Fetch, MD [facility] [phone]

## 2023-07-19 NOTE — Plan of Care (Signed)
  Problem: Education: Goal: Knowledge of General Education information will improve Description: Including pain rating scale, medication(s)/side effects and non-pharmacologic comfort measures 07/19/2023 2002 by Dallas Breeding, RN Outcome: Progressing 07/19/2023 1515 by Dallas Breeding, RN Outcome: Progressing   Problem: Clinical Measurements: Goal: Respiratory complications will improve Outcome: Progressing   Problem: Activity: Goal: Risk for activity intolerance will decrease Outcome: Progressing   Problem: Nutrition: Goal: Adequate nutrition will be maintained 07/19/2023 2002 by Dallas Breeding, RN Outcome: Progressing 07/19/2023 1515 by Dallas Breeding, RN Outcome: Progressing   Problem: Coping: Goal: Level of anxiety will decrease 07/19/2023 2002 by Dallas Breeding, RN Outcome: Progressing 07/19/2023 1515 by Dallas Breeding, RN Outcome: Progressing

## 2023-07-20 ENCOUNTER — Inpatient Hospital Stay (HOSPITAL_COMMUNITY): Payer: Medicare Other

## 2023-07-20 DIAGNOSIS — J939 Pneumothorax, unspecified: Secondary | ICD-10-CM | POA: Diagnosis not present

## 2023-07-20 DIAGNOSIS — J9311 Primary spontaneous pneumothorax: Secondary | ICD-10-CM | POA: Diagnosis not present

## 2023-07-20 LAB — CBC WITH DIFFERENTIAL/PLATELET
Abs Immature Granulocytes: 0.01 10*3/uL (ref 0.00–0.07)
Basophils Absolute: 0 10*3/uL (ref 0.0–0.1)
Basophils Relative: 1 %
Eosinophils Absolute: 0.2 10*3/uL (ref 0.0–0.5)
Eosinophils Relative: 3 %
HCT: 39.2 % (ref 36.0–46.0)
Hemoglobin: 12.9 g/dL (ref 12.0–15.0)
Immature Granulocytes: 0 %
Lymphocytes Relative: 22 %
Lymphs Abs: 1.3 10*3/uL (ref 0.7–4.0)
MCH: 30.4 pg (ref 26.0–34.0)
MCHC: 32.9 g/dL (ref 30.0–36.0)
MCV: 92.2 fL (ref 80.0–100.0)
Monocytes Absolute: 0.5 10*3/uL (ref 0.1–1.0)
Monocytes Relative: 9 %
Neutro Abs: 3.8 10*3/uL (ref 1.7–7.7)
Neutrophils Relative %: 65 %
Platelets: 135 10*3/uL — ABNORMAL LOW (ref 150–400)
RBC: 4.25 MIL/uL (ref 3.87–5.11)
RDW: 13.6 % (ref 11.5–15.5)
WBC: 5.8 10*3/uL (ref 4.0–10.5)
nRBC: 0 % (ref 0.0–0.2)

## 2023-07-20 MED ORDER — SODIUM CHLORIDE 0.9% FLUSH
3.0000 mL | Freq: Two times a day (BID) | INTRAVENOUS | Status: DC
  Administered 2023-07-20 – 2023-07-21 (×2): 3 mL via INTRAVENOUS

## 2023-07-20 MED ORDER — OXYCODONE-ACETAMINOPHEN 5-325 MG PO TABS: 1.0000 | ORAL_TABLET | ORAL | Status: DC | PRN

## 2023-07-20 NOTE — Plan of Care (Signed)

## 2023-07-20 NOTE — Progress Notes (Signed)
Subjective: Patient was seen and examined in her chair this morning. She had some increased pain last night that was improved with pain medication. She has not had to use any supplemental oxygen. Patient still has complaints of pain around the incision site for her chest tube. Denies any chest pain, shortness of breath, palpitations or lightheadedness.  Objective:  Vital signs in last 24 hours: Vitals:   07/19/23 2028 07/19/23 2320 07/20/23 0230 07/20/23 0447  BP: (!) 145/82 (!) 149/77 (!) 151/87   Pulse: 71 68 86   Resp: 13 20 20    Temp: 98 F (36.7 C) 98.6 F (37 C) 98.7 F (37.1 C)   TempSrc: Oral Oral Oral   SpO2: 96% 92% 95%   Weight:    66.4 kg  Height:       Weight change: -0.739 kg  Intake/Output Summary (Last 24 hours) at 07/20/2023 2956 Last data filed at 07/19/2023 2200 Gross per 24 hour  Intake 840 ml  Output 0 ml  Net 840 ml   Physical Exam Constitutional:      General: She is not in acute distress.    Appearance: She is not ill-appearing.  HENT:     Head: Normocephalic and atraumatic.  Cardiovascular:     Rate and Rhythm: Normal rate and regular rhythm.     Heart sounds: Murmur heard.     Comments: Chronic systolic murmur Pulmonary:     Effort: Pulmonary effort is normal.     Breath sounds: Normal breath sounds.     Comments: Mild tenderness noted around the area of chest tube insertion. Continuing to improve Chest:     Chest wall: Tenderness present.     Comments: Chest tube placed in the right lateral chest wall. No erythema, edema or drainage noted. Abdominal:     General: Abdomen is flat.  Musculoskeletal:        General: No swelling.  Skin:    General: Skin is warm.  Neurological:     Mental Status: She is alert.      Assessment/Plan: Patient is an 82 year old female with PMH of arthritis, severe MR, OSA on BiPAP, and depression who woke up with back pain, shortness of breath, and palpitations on 10/17. She is admitted for large  pneumothorax of the right lung.   Large Right Pneumothorax without Mediastinal Shift Resolved. Tube clamped yesterday with repeat CXR today. No clear trigger for the pneumothorax. Discussed possibility of spontaneous pneumothorax vs BiPAP with pulmonology. - Plan is to remove tube today if pneumothorax does not re-develop. Appreciate CT surgery. -Will continue current pain regimen and add one additional dose of oxycodone prn for increased pain.    New Onset Afib with RVR Atrial Flutter EKG 11:42 showed atrial flutter and LAFB. CHADS-VASC Score is 3 given age and her gender. Considered AC in this pt but her arrhythmia could be provoked by the pneumothorax. Patient remained in sinus rhythm overnight. Will continue to monitor telemetry.  -Will need OP cardiology follow up.    Obstructive Sleep Apnea Patient uses BiPAP machine at home. Currently not on supplemental oxygen saturating at 96% -Hold bipap for now. Pulm recommends holding for at least 4 weeks. -Continue supplemental oxygen as needed   Moderate Mitral Regurgitation Severe Tricuspid Regurgitation Patient is following Dr. Allyson Sabal for Cardiology for her 4-5 year history of valvular disease. She endorses chronic dyspnea on exertion. Last Echo 01/2023. Will monitor for now.    Thrombocytopenia Platelet count at 135 from 142 -No  plans for treatment at this time. Will continue to monitor. CBC pending.    Chronic Conditions: Arthritis-Tylenol and Tramadol PRN Depression- Home Cymbalta Post Menopausal Symptoms- Home Estrogen patch MF Muscle Spasms- Home Lyrica and Tizanidine Glaucoma- Home Eye Drops  Dispo: Possible discharge this afternoon pending chest tube removal  [name], MS4   LOS: 3 days   [name], Medical Student 07/20/2023, 6:48 AM

## 2023-07-20 NOTE — Progress Notes (Signed)
Chest tube removed VSS patient on bedrest for 1 hour monitor on  sequence every 15 minutes. Bed in lowest position with the call light in reach safety ensured. Current plan of care in progress

## 2023-07-20 NOTE — Progress Notes (Addendum)
      [address]       [place] [address]             [phone]         Subjective: Patient states she had some pain last night but this improved with pain medication. She denies shortness of breath.  Objective: Vital signs in last 24 hours: Temp:  [97.6 F (36.4 C)-98.7 F (37.1 C)] 97.6 F (36.4 C) (10/20 0816) Pulse Rate:  [63-86] 63 (10/20 0816) Cardiac Rhythm: Normal sinus rhythm (10/19 2151) Resp:  [13-20] 20 (10/20 0816) BP: (132-162)/(76-91) 162/91 (10/20 0816) SpO2:  [92 %-96 %] 96 % (10/20 0816) Weight:  [66.4 kg] 66.4 kg (10/20 0447)  Hemodynamic parameters for last 24 hours:    Intake/Output from previous day: 10/19 0701 - 10/20 0700 In: 840 [P.O.:840] Out: 0  Intake/Output this shift: No intake/output data recorded.  General appearance: alert, cooperative, and no distress Neurologic: intact Heart: regular rate and rhythm, S1, S2 normal, no murmur, click, rub or gallop Lungs: clear to auscultation bilaterally Abdomen: soft, non-tender; bowel sounds normal; no masses,  no organomegaly Extremities: edema 1+ Wound: Clean and dry dressing in place  Lab Results: Recent Labs    07/18/23 0518 07/20/23 0321  WBC 8.9 5.8  HGB 12.7 12.9  HCT 39.4 39.2  PLT 142* 135*   BMET:  Recent Labs    07/17/23 1145 07/17/23 1151 07/18/23 0518  NA 136 139 138  K 4.4 4.4 3.8  CL 106 105 102  CO2 22  --  25  GLUCOSE 98 97 96  BUN 16 21 17   CREATININE 0.77 0.80 0.84  CALCIUM 8.6*  --  9.2    PT/INR: No results for input(s): "LABPROT", "INR" in the last 72 hours. ABG    Component Value Date/Time   PHART 7.324 (L) 02/13/2023 1544   HCO3 21.6 02/13/2023 1544   TCO2 24 07/17/2023 1151   ACIDBASEDEF 4.0 (H) 02/13/2023 1544   O2SAT 81 02/13/2023 1544   CBG (last 3)  No results for input(s): "GLUCAP" in the last 72 hours.  Assessment/Plan: S/P  Neuro: Some pain overnight, now controlled. Pain control per primary service   CV: Stable  vital signs. NSR-SB. HTN, BP control per primary service.    Pulm: Saturating well on RA this AM. CXR with stable small right apical pneumothorax after 24 hour clamping trial. No rush of air when chest tube was unclamped. Will d/c chest tube.   Dispo: D/C chest tube and get follow up CXR this afternoon. If stable follow up CXR, pt is stable for discharge from CT surgery perspective.   LOS: 3 days    Jenny Reichmann, PA-C 07/20/2023  Patient seen and examined, agree with above Dc chest tube  Viviann Spare C. Dorris Fetch, MD [facility] [phone]

## 2023-07-21 ENCOUNTER — Other Ambulatory Visit: Payer: Self-pay | Admitting: Cardiology

## 2023-07-21 DIAGNOSIS — I4891 Unspecified atrial fibrillation: Secondary | ICD-10-CM

## 2023-07-21 DIAGNOSIS — I493 Ventricular premature depolarization: Secondary | ICD-10-CM

## 2023-07-21 DIAGNOSIS — J9311 Primary spontaneous pneumothorax: Secondary | ICD-10-CM | POA: Diagnosis not present

## 2023-07-21 DIAGNOSIS — J939 Pneumothorax, unspecified: Secondary | ICD-10-CM | POA: Diagnosis not present

## 2023-07-21 DIAGNOSIS — R002 Palpitations: Secondary | ICD-10-CM

## 2023-07-21 NOTE — Progress Notes (Addendum)
      [address]       [place] [address]             [phone]         Subjective: Patient awoken from sleep when entering the room. No complaints this AM.  Objective: Vital signs in last 24 hours: Temp:  [97.4 F (36.3 C)-97.8 F (36.6 C)] 97.8 F (36.6 C) (10/21 0340) Pulse Rate:  [58-63] 58 (10/21 0340) Cardiac Rhythm: Normal sinus rhythm;Heart block (10/20 1900) Resp:  [18-20] 18 (10/21 0340) BP: (117-162)/(65-91) 117/73 (10/21 0340) SpO2:  [93 %-96 %] 93 % (10/21 0340) Weight:  [66 kg] 66 kg (10/21 0112)  Hemodynamic parameters for last 24 hours:    Intake/Output from previous day: No intake/output data recorded. Intake/Output this shift: No intake/output data recorded.  General appearance: alert, cooperative, and no distress Neurologic: intact Heart: regular rate-sinus bradycardia, regular rhythm Lungs: clear to auscultation bilaterally Abdomen: soft, non-tender; bowel sounds normal; no masses,  no organomegaly Extremities: edema trace Wound: Clean and dry dressing in place  Lab Results: Recent Labs    07/20/23 0321  WBC 5.8  HGB 12.9  HCT 39.2  PLT 135*   BMET: No results for input(s): "NA", "K", "CL", "CO2", "GLUCOSE", "BUN", "CREATININE", "CALCIUM" in the last 72 hours.  PT/INR: No results for input(s): "LABPROT", "INR" in the last 72 hours. ABG    Component Value Date/Time   PHART 7.324 (L) 02/13/2023 1544   HCO3 21.6 02/13/2023 1544   TCO2 24 07/17/2023 1151   ACIDBASEDEF 4.0 (H) 02/13/2023 1544   O2SAT 81 02/13/2023 1544   CBG (last 3)  No results for input(s): "GLUCAP" in the last 72 hours.  Assessment/Plan: S/P   Neuro: Pain controlled this AM   CV: Stable vital signs. NSR-SB. No further afib.    Pulm: Saturating well on RA this AM. CT removed yesterday, follow up CXR without pneumothorax.     Dispo: Patient is stable for discharge from CT surgery perspective   LOS: 4 days    Jenny Reichmann,  PA-C 07/21/2023   Agree with above Please call with questions  Corliss Skains

## 2023-07-21 NOTE — Plan of Care (Signed)
  Problem: Clinical Measurements: Goal: Ability to maintain clinical measurements within normal limits will improve Outcome: Progressing   Problem: Clinical Measurements: Goal: Will remain free from infection Outcome: Progressing   Problem: Clinical Measurements: Goal: Respiratory complications will improve Outcome: Progressing   Problem: Clinical Measurements: Goal: Cardiovascular complication will be avoided Outcome: Progressing   

## 2023-07-21 NOTE — Discharge Summary (Signed)
Name: Tanya Leon MRN: [id number] DOB: 1941-03-21 82 y.o. PCP: [name], MD  Date of Admission: 07/17/2023 11:19 AM Date of Discharge: 07/21/2023 11:50 AM Attending Physician: Dr. Heide Spark  Discharge Diagnosis: Principal Problem:   Pneumothorax on right    Discharge Medications: Allergies as of 07/21/2023       Reactions   Crab [shellfish Allergy] Swelling, Other (See Comments)   Soft-shelled crab   Doxycycline Rash        Medication List     STOP taking these medications    amoxicillin 500 MG capsule Commonly known as: AMOXIL   traMADol 50 MG tablet Commonly known as: ULTRAM       TAKE these medications    Advanced Calcium/D/Magnesium Tabs Take 1 tablet by mouth 2 (two) times daily. includes boron and betaine   ascorbic acid 1000 MG tablet Commonly known as: VITAMIN C Take 1,000 mg by mouth daily.   aspirin-acetaminophen-caffeine 250-250-65 MG tablet Commonly known as: EXCEDRIN MIGRAINE Take 2 tablets by mouth every 6 (six) hours as needed for headache.   betamethasone (augmented) 0.05 % lotion Commonly known as: DIPROLENE Apply 1 Application topically as needed (itchy ears).   Biotin 5000 MCG Caps Take 5,000 mcg by mouth daily.   calcium carbonate 750 MG chewable tablet Commonly known as: TUMS EX Chew 1 tablet by mouth daily as needed for heartburn.   carboxymethylcellulose 0.5 % Soln Commonly known as: REFRESH PLUS Place 1 drop into both eyes 3 (three) times daily as needed (Dry eyes).   clobetasol 0.05 % topical foam Commonly known as: OLUX Apply 1 Application topically once a week. What changed: Another medication with the same name was removed. Continue taking this medication, and follow the directions you see here.   cycloSPORINE 0.05 % ophthalmic emulsion Commonly known as: RESTASIS Place 1 drop into both eyes 2 (two) times daily.   DULoxetine 60 MG capsule Commonly known as: CYMBALTA Take 60 mg by mouth daily.    estradiol 0.1 MG/24HR patch Commonly known as: VIVELLE-DOT Place 1 patch onto the skin See admin instructions. Apply a new patch to the skin on Mondays and Fridays   EVENING PRIMROSE OIL PO Take 1,300 mg by mouth 2 (two) times daily.   fexofenadine 180 MG tablet Commonly known as: ALLEGRA Take 180 mg by mouth at bedtime.   fluocinonide 0.05 % external solution Commonly known as: LIDEX Apply 1 application  topically once a week. Applied to scalp after shampooing   fluticasone 50 MCG/ACT nasal spray Commonly known as: FLONASE SPRAY 2 SPRAYS INTO EACH NOSTRIL EVERY DAY What changed: See the new instructions.   Lutein 20 MG Tabs Take 20 mg by mouth 2 (two) times daily.   multivitamin with minerals Tabs tablet Take 1 tablet by mouth daily.   olopatadine 0.1 % ophthalmic solution Commonly known as: PATANOL INSTILL 1 DROP INTO BOTH EYES TWICE A DAY What changed:  when to take this reasons to take this   Omega 3 1000 MG Caps Take 1,000 mg by mouth 2 (two) times daily.   polyethylene glycol powder 17 GM/SCOOP powder Commonly known as: GLYCOLAX/MIRALAX Take 17 g by mouth daily.   pregabalin 25 MG capsule Commonly known as: LYRICA Take 1 capsule (25 mg total) by mouth 2 (two) times daily. TAKE 1 CAPSULE IN THE MORNING AND TAKE 1 CAPSULE IN THE EVENING What changed:  how much to take when to take this additional instructions   PRESCRIPTION MEDICATION Apply 1 Application topically  at bedtime. Azelaic acid 15%, metronidazole 1%, ivermectin 1%   PRESERVISION AREDS 2 PO Take 1 tablet by mouth 2 (two) times daily.   tiZANidine 2 MG tablet Commonly known as: ZANAFLEX TAKE 1 TABLET (2 MG TOTAL) BY MOUTH AT BEDTIME AS NEEDED. What changed: when to take this   Vitamin D3 50 MCG (2000 UT) Tabs Take 2,000 Units by mouth every evening.               Durable Medical Equipment  (From admission, onward)           Start     Ordered   07/20/23 1241  For home use  only DME oxygen  Once       Question Answer Comment  Length of Need 6 Months   Mode or (Route) Nasal cannula   Liters per Minute 2   Oxygen delivery system Gas      07/20/23 1240            Disposition and follow-up:   Tanya Leon was discharged from [facility] in Stable condition.  At the hospital follow up visit please address:  1.  Follow-up:  *Large R pneumothorax without mediastinal shift *Obstructive sleep apnea -s/p chest tube placement and repeat CXR with no PTX -Ensure stable vital signs  -Ensure no BiPAP for 4 weeks post discharge -Ensure follow up with patient's pulmonologist  *Demand Atrial fibrillation -No DOAC -Repeat EKG and review cardiac monitor -Ensure Follow up at Dr. Hazle Coca office -If atrial fibrillation, consider anticoagulation  *Mobility -Ensure HH OT and PT  *Moderate MR *Severe tricuspid regurgitation -Ensure follow up with cardiology. No Mitral stenosis seen on TTE on 01/2023  *Thrombocytopenia -Repeat CBC at follow up   Medication Changes None  Follow-up Appointments: 11/05 - Cardiology 11/2023 - pulmonology; will need a sooner appointment  Hospital Course by problem list: 82 year old female with PMH of arthritis, severe MR, OSA on BiPAP, and depression who woke up with back pain, shortness of breath, and palpitations this morning. She endorses chronic dyspnea on exertion but her shortness of breath was much more severe than she had in the past. She has been told she had PVCs in the past but has not had such a sustained episode of heart palpitations before. Patient denied having any severe distress, diaphoresis or chest pain at the onset of symptoms. Patient checked her heart rate and blood pressure when her symptoms started. Her heart rate was in 100s and is usually 50-60s at baseline. Her SBP was low in the in the 80s when she checked this morning. Patient went to a local clinic and EMS was called. Blood pressure  was initially 86/65 with EMS and later improved to 118/88. She was also found to be in new onset afib RVR. Chest CT and CXR showed large pneumothorax of the right lung and chest tube was successfully placed in the ED.   Large Right Pneumothorax without Mediastinal Shift Initial Chest CT and CXR showed a large right pneumothorax with expansion of lung following chest tube placement in the ED. Confirmed with repeat CXR. Patient was using 2L nasal cannula as needed and is saturating well on room air now. Chest tube followed by CT surg team; Chest tube clamped on 10/19 without worsening of symptoms. Chest tube removed 10/20 and repeat CXR without repeat pneumothorax. A small right pleural effusion was noted on imaging.  Obstructive Sleep Apnea Patient uses BiPAP machine at home. Patient was using 2L [place]  as needed. Patient now currently saturating well on room air.  Pulm recommends holding BiPAP for 4 weeks as patient is on high home settings. No supplemental O2 ordered at discharge as patient did not qualify  New Onset Afib with RVR Atrial Flutter EKG 11:42 showed atrial flutter and LAFB. Patient received one dose Lopressor in the ED and converted into sinus rhythm. CHADS-VASC Score is 3 given age and her gender. Considered AC in this pt but her arrhythmia could be provoked by the pneumothorax. Patient remained in sinus rhythm for the remainder of the hospitalization. Will follow up with Dr. Allyson Sabal, her cardiologist, and be sent home with a cardiac monitor. Of note, patient with mitral and tricuspid regurgitations on prior TTEs, no valvular atrial fibrillation criteria met.  Moderate Mitral Regurgitation Severe Tricuspid Regurgitation Patient is following Dr. Allyson Sabal for Cardiology for her 4-5 year history of valvular disease. She endorses chronic dyspnea on exertion. Last Echo 01/2023.    Thrombocytopenia Platelet count at 130-140s. Monitor at follow up   Chronic Conditions: Arthritis-Tylenol and  Tramadol PRN Depression- Home Cymbalta Post Menopausal Symptoms- Home Estrogen patch MF Muscle Spasms- Home Lyrica and Tizanidine Glaucoma- Home Eye Drops  Discharge Subjective: Feeling well, no changes in her ambulation. Feels stronger. Denies lightheadedness, chest pain, new shortness of breath. Has not needed O2 supplementation overnight. No flutter or palpitations.   Discharge Exam:   Blood pressure 124/72, pulse 65, temperature 98 F (36.7 C), temperature source Oral, resp. rate (!) 22, height 5\' 1"  (1.549 m), weight 66 kg, SpO2 93%.  Constitutional:Well-appearing woman sitting in examination chair, in no acute distress HENT: normocephalic atraumatic, mucous membranes moist Cardiovascular: regular rate and rhythm Pulmonary/Chest: normal work of breathing on room air, lungs clear to auscultation bilaterally. No crackles  Abdominal: soft, non-tender, non-distended.  Neurological: alert & oriented x 3 MSK: no gross abnormalities. No pitting edema Skin: warm and dry Psych: Normal mood and affect  Pertinent Labs, Studies, and Procedures:     Latest Ref Rng & Units 07/20/2023    3:21 AM 07/18/2023    5:18 AM 07/17/2023   11:51 AM  CBC  WBC 4.0 - 10.5 K/uL 5.8  8.9    Hemoglobin 12.0 - 15.0 g/dL 16.1  09.6  04.5   Hematocrit 36.0 - 46.0 % 39.2  39.4  42.0   Platelets 150 - 400 K/uL 135  142         Latest Ref Rng & Units 07/18/2023    5:18 AM 07/17/2023   11:51 AM 07/17/2023   11:45 AM  CMP  Glucose 70 - 99 mg/dL 96  97  98   BUN 8 - 23 mg/dL 17  21  16    Creatinine 0.44 - 1.00 mg/dL 4.09  8.11  9.14   Sodium 135 - 145 mmol/L 138  139  136   Potassium 3.5 - 5.1 mmol/L 3.8  4.4  4.4   Chloride 98 - 111 mmol/L 102  105  106   CO2 22 - 32 mmol/L 25   22   Calcium 8.9 - 10.3 mg/dL 9.2   8.6   Total Protein 6.5 - 8.1 g/dL   6.1   Total Bilirubin 0.3 - 1.2 mg/dL   1.0   Alkaline Phos 38 - 126 U/L   48   AST 15 - 41 U/L   30   ALT 0 - 44 U/L   21     DG CHEST PORT 1  VIEW  Result Date: 07/18/2023  CLINICAL DATA:  Right pneumothorax follow-up. EXAM: PORTABLE CHEST 1 VIEW COMPARISON:  Chest x-ray from yesterday. FINDINGS: Unchanged right-sided chest tube. Residual pneumothorax. Minimal bibasilar atelectasis. No consolidation or pleural effusion. Unchanged cardiomegaly. No acute osseous abnormality. IMPRESSION: 1. Unchanged right-sided chest tube with no residual pneumothorax. Electronically Signed   By: Obie Dredge M.D.   On: 07/18/2023 14:36   DG Chest Port 1 View  Result Date: 07/17/2023 CLINICAL DATA:  Post chest tube placement EXAM: PORTABLE CHEST 1 VIEW COMPARISON:  Radiographs earlier today FINDINGS: Interval placement of right chest tube. Right pneumothorax no longer visualized. Right basilar atelectasis. No pleural effusion or pneumothorax. No displaced rib fractures stable cardiomediastinal silhouette. Aortic atherosclerotic calcification. IMPRESSION: Interval placement of right chest tube with resolution of right pneumothorax. Electronically Signed   By: Minerva Fester M.D.   On: 07/17/2023 15:06   DG Chest Port 1 View  Result Date: 07/17/2023 CLINICAL DATA:  Shortness of breath and palpitations EXAM: PORTABLE CHEST 1 VIEW COMPARISON:  CTA chest 02/08/2042 p.m. on 07/17/2023 subsequent radiograph 2:36 p.m. on 10/17 FINDINGS: Large right pneumothorax without definite mediastinal shift. On subsequent radiographic 2:36 p.m. after chest placement this has resolved. Atelectasis or infiltrates lower lungs no pleural effusion. Cardiomegaly. Aortic atherosclerotic calcification. No displaced rib fractures. IMPRESSION: Large right pneumothorax without definite mediastinal shift. On subsequent radiographic 2:36 p.m. after chest placement, this has resolved. Electronically Signed   By: Minerva Fester M.D.   On: 07/17/2023 15:05   CT Angio Chest PE W and/or Wo Contrast  Result Date: 07/17/2023 CLINICAL DATA:  High probability pulmonary embolism. Irregular  heartbeat. EXAM: CT ANGIOGRAPHY CHEST WITH CONTRAST TECHNIQUE: Multidetector CT imaging of the chest was performed using the standard protocol during bolus administration of intravenous contrast. Multiplanar CT image reconstructions and MIPs were obtained to evaluate the vascular anatomy. RADIATION DOSE REDUCTION: This exam was performed according to the departmental dose-optimization program which includes automated exposure control, adjustment of the mA and/or kV according to patient size and/or use of iterative reconstruction technique. CONTRAST:  60mL OMNIPAQUE IOHEXOL 350 MG/ML SOLN COMPARISON:  None Available. FINDINGS: Cardiovascular: No filling defects within the pulmonary arteries to suggest acute pulmonary embolism. No significant vascular findings. Normal heart size. No pericardial effusion. Mediastinum/Nodes: No axillary or supraclavicular adenopathy. No mediastinal or hilar adenopathy. No pericardial fluid. Esophagus normal. Lungs/Pleura: Large RIGHT pneumothorax noted. No clear mediastinal shift. Atelectasis of the collapsed RIGHT middle lobe. LEFT lung clear. Upper Abdomen: Limited view of the liver, kidneys, pancreas are unremarkable. Normal adrenal glands. Musculoskeletal: No aggressive osseous lesion. Review of the MIP images confirms the above findings. IMPRESSION: 1. Large RIGHT pneumothorax. Follow-up chest radiograph at 2:33 hours demonstrates placement RIGHT chest tube with expansion of the RIGHT lung. 2. No evidence acute pulmonary embolism. Electronically Signed   By: Genevive Bi M.D.   On: 07/17/2023 15:01      Signed: Morene Crocker, MD [facility] Internal [facility] - PGY2 Pager: [phone] 07/21/2023, 11:50 AM    Please contact the on call pager after 5 pm and on weekends at [phone].

## 2023-07-21 NOTE — Discharge Instructions (Addendum)
Hello Tanya Leon,   we admitted you for the large right pneumothorax. While in the hospital, we treated you with a chest tube, which has now been removed. The follow up imaging confirms resolution of the pneumothorax. We recommend that you do not use you BiPAP machine for 4 weeks because of the pneumothorax and that you follow-up with your pulmonologist before restarting.  You were also found to be in atrial fibrillation by EMS, which is an abnormal rhythm of the heart. In your case, this may have been because of the stress of the pneumothorax.  Often, patients need to be on anticoagulation. In your case, we will choose to monitor your heart with a device that will be sent to your home. This will be followed up with the cardiologist and if needed, you will be able to discuss anticoagulation. We recommend that you follow-up with Dr. Allyson Sabal after wearing the monitor to see if you will need any anticoagulation.  Make sure you make the following appointments: - your primary care provider in the next 1-2 weeks - Pulmonology in ~4 weeks - Cardiology in about 4 weeks  Please seek medical attention if you experience: bluish color of the skin, chest tightness, lightheadedness, fainting, abnormal breathing or changes in your heart rate.   It was a pleasure caring for you, Tanya Leon, MS4 Morene Crocker, MD Internal Medicine [facility]

## 2023-07-21 NOTE — TOC Transition Note (Signed)
Transition of Care (TOC) - CM/SW Discharge Note Donn Pierini RN, BSN Transitions of Care Unit 4E- RN Case Manager See Treatment Team for direct phone #   Patient Details  Name: Tanya Leon MRN: [id number] Date of Birth: Nov 15, 1940  Transition of Care [facility]) CM/SW Contact:  Darrold Span, RN Phone Number: 07/21/2023, 12:39 PM   Clinical Narrative:    Pt stable for transition home today, noted order placed for home 02- no qualifying note found- per bedside RN- pt on RA this am. Discussed with MD, who stated they wanted pt to have for night-time use- explained that pt would need to have over-night oximetry done for qualification needs- provider to discontinue 02 order- not needed at this time.   Order placed for HHPT and DME- RW.  CM spoke with pt and spouse at bedside- discussed HH and DME needs- pt politely declines RW- voiced she does not need it. Pt reluctantly agreeable to [facility]- however voiced she would like to use the in-house therapy at [facility].  CM will fax order to [facility].   Address, phone # and PCP all confirmed, Spouse to transport home.   Call made to [facility] and spoke with Lawson Fiscal in the [employer] there. Order for HHPT faxed to [facility]- at [phone]- they will follow up with pt on return to [facility] for therapy needs.   No further TOC needs noted.    Final next level of care: Home w Home Health Services Barriers to Discharge: No Barriers Identified   Patient Goals and CMS Choice CMS Medicare.gov Compare Post Acute Care list provided to:: Patient Choice offered to / list presented to : Patient  Discharge Placement                 Home w/ HH (in house f/u)        Discharge Plan and Services Additional resources added to the After Visit Summary for     Discharge Planning Services: CM Consult Post Acute Care Choice: Home Health, Durable Medical Equipment          DME Arranged: Walker rolling, Patient  refused services DME Agency: NA       HH Arranged: PT HH Agency: Other - See comment ([facility] [employer]) Date [facility] Agency Contacted: 07/21/23 Time HH Agency Contacted: 1238 Representative spoke with at [facility] Agency: Lawson Fiscal  Social Determinants of Health (SDOH) Interventions SDOH Screenings   Food Insecurity: No Food Insecurity (07/17/2023)  Housing: Low Risk  (07/17/2023)  Transportation Needs: No Transportation Needs (07/17/2023)  Utilities: Not At Risk (07/17/2023)  Depression (PHQ2-9): Low Risk  (12/23/2018)  Financial Resource Strain: Low Risk  (07/09/2020)   Received from [facility] visits prior to 11/30/2022., [facility] visits prior to 11/30/2022.  Physical Activity: Unknown (07/09/2020)   Received from [facility] visits prior to 11/30/2022., [facility] visits prior to 11/30/2022.  Social Connections: Unknown (07/11/2022)   Received [facility], [facility]  Stress: No Stress Concern Present (07/09/2020)   [facility] from [facility] visits prior to 11/30/2022., [facility] visits prior to 11/30/2022.  Tobacco Use: Low Risk  (07/17/2023)  Health Literacy: Unknown (07/11/2022)   Received from [facility]     Readmission Risk Interventions    07/21/2023   12:39 PM  Readmission Risk Prevention Plan  Post Dischage  Appt Complete  Medication Screening Complete  Transportation Screening Complete

## 2023-07-21 NOTE — Progress Notes (Signed)
Patient given discharge instructions. PIV removed. Telemetry box removed, CCMD notified. Patient taken to vehicle in wheelchair by staff.  Tanya Dearcos L Anzal Bartnick, RN  

## 2023-07-21 NOTE — Progress Notes (Signed)
Physical Therapy Treatment Patient Details Name: Tanya Leon MRN: [id number] DOB: 03/01/1941 Today's Date: 07/21/2023   History of Present Illness Pt is an 82 yo female who presented to local clinic with increased SOB, back pain, hypotension, palpitations, and tachycardia. EMS was called and brought pt to [facility] ED on 10/17 where pt was found to have new onset afib RVR and chest CT and CXR showed large pneumothorax of the right lung. Chest tube was successfully placed in the ED. PMH of chronic dyspnea on exertion, arthritis, severe MR, OSA on BiPAP, PVCs, and depression.    PT Comments  Pt progressing well towards all goals. Pt continues with noted DOE/SOB with ambulation however was unable to get a pulse ox reading while ambulating. Pt with short step height and length, near shuffle gait pattern without AD. Educated pt on the benefits of the rollator for energy conservation, improvement in posture and gait kinematics, and to increase ambulation distance. Acute PT to cont to follow.    If plan is discharge home, recommend the following: A little help with walking and/or transfers;A little help with bathing/dressing/bathroom;Assistance with cooking/housework;Help with stairs or ramp for entrance;Assist for transportation   Can travel by private Psychologist, clinical (4 wheels)    Recommendations for Other Services       Precautions / Restrictions Precautions Precautions: Fall Restrictions Weight Bearing Restrictions: No     Mobility  Bed Mobility               General bed mobility comments: Pt sitting in recliner at beginning and end of session    Transfers Overall transfer level: Needs assistance Equipment used: None   Sit to Stand: Contact guard assist           General transfer comment: pushed up from armrests, increased time, wide base of support    Ambulation/Gait Ambulation/Gait assistance: Contact guard assist Gait Distance  (Feet): 200 Feet Assistive device: None Gait Pattern/deviations: Step-through pattern, Decreased stride length, Trunk flexed Gait velocity: dec     General Gait Details: pt deferred use of RW this date stating "i don't use one at home". Pt with noted SOB, 2/4 DOE requiring 1 standing rest break. Unable to get a reading from the pulse ox during amb, once seated after amb pt's SpO2 95%. Pt with short step height and lenght, near shuffle gait pattern. pt educated on benefits of RW/rollator for improved posture, gait kinematics, and to permit increased ambulation tolerance however pt continue to defer   Stairs Stairs: Yes Stairs assistance: Contact guard assist Stair Management: Two rails, Alternating pattern, One rail Left, Forwards Number of Stairs: 12 General stair comments: pt ascending alternating using L hand rail, descending alternating using bilat handrails   Wheelchair Mobility     Tilt Bed    Modified Rankin (Stroke Patients Only)       Balance Overall balance assessment: Needs assistance Sitting-balance support: No upper extremity supported, Single extremity supported, Feet supported Sitting balance-Leahy Scale: Good     Standing balance support: During functional activity, Bilateral upper extremity supported, Single extremity supported Standing balance-Leahy Scale: Fair                              Cognition Arousal: Alert Behavior During Therapy: WFL for tasks assessed/performed Overall Cognitive Status: Within Functional Limits for tasks assessed  General Comments: AAOx4 and pleasant throughout session. Pt demonstrates good safety awareness and insight into deficits.        Exercises      General Comments General comments (skin integrity, edema, etc.): Pt with noted SOB during activity however unable to get pulse ox reading, pt states this to be baseline, SpO2 95% on RA upon sitting s/p amb       Pertinent Vitals/Pain Pain Assessment Pain Assessment: No/denies pain    Home Living                          Prior Function            PT Goals (current goals can now be found in the care plan section) Acute Rehab PT Goals Patient Stated Goal: to go home PT Goal Formulation: With patient Time For Goal Achievement: 08/01/23 Potential to Achieve Goals: Good    Frequency    Min 1X/week      PT Plan      Co-evaluation              AM-PAC PT "6 Clicks" Mobility   Outcome Measure  Help needed turning from your back to your side while in a flat bed without using bedrails?: A Little Help needed moving from lying on your back to sitting on the side of a flat bed without using bedrails?: A Little Help needed moving to and from a bed to a chair (including a wheelchair)?: A Little Help needed standing up from a chair using your arms (e.g., wheelchair or bedside chair)?: A Little Help needed to walk in hospital room?: A Little Help needed climbing 3-5 steps with a railing? : A Little 6 Click Score: 18    End of Session   Activity Tolerance: Patient tolerated treatment well Patient left: in chair;with call bell/phone within reach Nurse Communication: Mobility status PT Visit Diagnosis: Unsteadiness on feet (R26.81);Muscle weakness (generalized) (M62.81)     Time: 1610-9604 PT Time Calculation (min) (ACUTE ONLY): 16 min  Charges:    $Gait Training: 8-22 mins PT General Charges $$ ACUTE PT VISIT: 1 Visit                     Tanya Leon, PT, DPT Acute Rehabilitation Services Secure chat preferred Office #: [phone]    Tanya Leon 07/21/2023, 8:27 AM

## 2023-07-29 DIAGNOSIS — I493 Ventricular premature depolarization: Secondary | ICD-10-CM | POA: Diagnosis not present

## 2023-07-29 DIAGNOSIS — I4891 Unspecified atrial fibrillation: Secondary | ICD-10-CM

## 2023-07-29 DIAGNOSIS — R002 Palpitations: Secondary | ICD-10-CM

## 2023-08-05 ENCOUNTER — Ambulatory Visit: Payer: Medicare Other | Attending: Cardiovascular Disease | Admitting: Cardiovascular Disease

## 2023-08-05 ENCOUNTER — Encounter: Payer: Self-pay | Admitting: Cardiovascular Disease

## 2023-08-05 VITALS — BP 136/82 | HR 59 | Ht 61.0 in | Wt 145.8 lb

## 2023-08-05 DIAGNOSIS — I4891 Unspecified atrial fibrillation: Secondary | ICD-10-CM | POA: Diagnosis not present

## 2023-08-05 DIAGNOSIS — R002 Palpitations: Secondary | ICD-10-CM | POA: Insufficient documentation

## 2023-08-05 DIAGNOSIS — I480 Paroxysmal atrial fibrillation: Secondary | ICD-10-CM | POA: Diagnosis present

## 2023-08-05 DIAGNOSIS — I493 Ventricular premature depolarization: Secondary | ICD-10-CM | POA: Insufficient documentation

## 2023-08-05 DIAGNOSIS — I340 Nonrheumatic mitral (valve) insufficiency: Secondary | ICD-10-CM | POA: Diagnosis not present

## 2023-08-05 DIAGNOSIS — I272 Pulmonary hypertension, unspecified: Secondary | ICD-10-CM | POA: Insufficient documentation

## 2023-08-05 DIAGNOSIS — G4733 Obstructive sleep apnea (adult) (pediatric): Secondary | ICD-10-CM | POA: Insufficient documentation

## 2023-08-05 NOTE — Patient Instructions (Signed)
Medication Instructions:  No changes    *If you need a refill on your cardiac medications before your next appointment, please call your pharmacy*   Lab Work: None    If you have labs (blood work) drawn today and your tests are completely normal, you will receive your results only by: MyChart Message (if you have MyChart) OR A paper copy in the mail If you have any lab test that is abnormal or we need to change your treatment, we will call you to review the results.   Testing/Procedures: None    Follow-Up: At [facility], you and your health needs are our priority.  As part of our continuing mission to provide you with exceptional heart care, we have created designated Provider Care Teams.  These Care Teams include your primary Cardiologist (physician) and Advanced Practice Providers (APPs -  Physician Assistants and Nurse Practitioners) who all work together to provide you with the care you need, when you need it.  We recommend signing up for the patient portal called "MyChart".  Sign up information is provided on this After Visit Summary.  MyChart is used to connect with patients for Virtual Visits (Telemedicine).  Patients are able to view lab/test results, encounter notes, upcoming appointments, etc.  Non-urgent messages can be sent to your provider as well.   To learn more about what you can do with MyChart, go to [email or web address].    Your next appointment:   6 month(s)  The format for your next appointment:   In Person  Provider:   Nanetta Batty, MD    Other Instructions Please forward results of 30 day heart monitor to Dr. Renelda Mom office once the results are back.

## 2023-08-05 NOTE — Assessment & Plan Note (Signed)
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  She had a TEE performed 02/13/2023 that revealed normal LV systolic function with the above valvular abnormality.  I did right left heart cath on her revealing normal coronary arteries.  I did review for her to Dr. Leafy Ro who suggested that she would need mitral valve replacement and tricuspid valve repair.  Given the fact that she was asymptomatic he was elected to treat her conservatively.

## 2023-08-05 NOTE — Progress Notes (Signed)
08/05/2023 Tanya Leon   Aug 09, 1941  [id number]  Primary Physician [name], MD Primary Cardiologist: Runell Gess MD Nicholes Calamity, [place]  HPI:  Tanya Leon is a 82 y.o.   thin-appearing married Caucasian female mother of 2, grandmother, 4 grandchildren was accompanied who is husband Mack Guise is a patient of mine as well.  I last saw her in the office 04/02/2023.Marland Kitchen She was she has no cardiac risk factors other than her mother died of a myocardial infarction at age 69. She has never had a heart attack or stroke. She denies chest pain or shortness of breath. She has noticed palpitations over the last month with some occasional dizziness. EKG performed by her PCP revealed PVCs as did her EKG today. She was drinking 2 cups of coffee a day which she has changed to decaf. Thyroid function tests were normal.      Because of palpitations she did have a monitor which failed to show PVCs.  She says that her palpitations have resolved since I last saw her.  She had a 2D echo performed 07/27/2019 showed normal LV size and function, mild bileaflet prolapse with mild to moderate MR, and diastolic dysfunction with a RV systolic blood pressure wound which was mildly elevated at 35 mmHg.  She does exercise, walks up 3 flights of stairs at [facility] which is where she and her husband live does complain of some mild shortness of breath with with moderate exertion.    She apparently injured her ankle and no longer is playing pickle ball.   Since I saw her a year ago she has been diagnosed with severe obstructive sleep apnea and has been treated with BiPAP.  She feels clinically improved.  A  2D echo performed 08/02/2020 revealed normal LV systolic function, grade 1 diastolic dysfunction with only mild MR and posterior leaflet prolapse.  Her RV systolic pressure was 37 mmHg.       She is very active and is completely asymptomatic denying chest pain or shortness of breath.  She did have a 2D echo  performed 01/03/22 revealing normal LV size and function with severe mitral regurgitation and posterior leaflet prolapse with mild to moderate left atrial dilatation and stable mild pulmonary hypertension with an RV systolic pressure of 35 mmHg.   As result of this I referred her to Dr. Excell Seltzer for consideration of MitraClip being.  She had a right left heart cath that showed clean coronary arteries without evidence of pulm hypertension and normal filling pressures.  TEE performed Dr. Eden Emms revealed severe MR and TR with prolapse of the P2 and P3 leaflets as well as prolapse of the tricuspid leaflets.  Her LV size and function were normal.  She was then sent to Dr. Leafy Ro who felt that she was not a mitral valve repair candidate but rather atrial valve replacement and tricuspid valve repair.  She is very active and is relatively asymptomatic.  The consensus was watchful waiting and conservative therapy at this time.  Since I saw her 4 months ago she was admitted to the hospital on 07/17/2023 with acute onset of chest pain and shortness of breath.  Her chest CT showed a large right pneumothorax.  Chest tube was inserted and removed 4 days later with full expansion.  She did have a brief episode of PAF which resolved with administration of 1 dose of Lopressor.  She is currently wearing a 30-day Zio patch and her EKG today reveals  sinus rhythm.  Otherwise she is remained stable since I saw her.   Current Meds  Medication Sig   ascorbic acid (VITAMIN C) 1000 MG tablet Take 1,000 mg by mouth daily.   aspirin-acetaminophen-caffeine (EXCEDRIN MIGRAINE) 250-250-65 MG tablet Take 2 tablets by mouth every 6 (six) hours as needed for headache.    betamethasone, augmented, (DIPROLENE) 0.05 % lotion Apply 1 Application topically as needed (itchy ears).   Biotin 5000 MCG CAPS Take 5,000 mcg by mouth daily.   calcium carbonate (TUMS EX) 750 MG chewable tablet Chew 1 tablet by mouth daily as needed for heartburn.    carboxymethylcellulose (REFRESH PLUS) 0.5 % SOLN Place 1 drop into both eyes 3 (three) times daily as needed (Dry eyes).   Cholecalciferol (VITAMIN D3) 50 MCG (2000 UT) TABS Take 2,000 Units by mouth every evening.   clobetasol (OLUX) 0.05 % topical foam Apply 1 Application topically once a week.   cycloSPORINE (RESTASIS) 0.05 % ophthalmic emulsion Place 1 drop into both eyes 2 (two) times daily.    DULoxetine (CYMBALTA) 60 MG capsule Take 60 mg by mouth daily.   estradiol (VIVELLE-DOT) 0.1 MG/24HR patch Place 1 patch onto the skin See admin instructions. Apply a new patch to the skin on Mondays and Fridays   EVENING PRIMROSE OIL PO Take 1,300 mg by mouth 2 (two) times daily.   fexofenadine (ALLEGRA) 180 MG tablet Take 180 mg by mouth at bedtime.   fluocinonide (LIDEX) 0.05 % external solution Apply 1 application  topically once a week. Applied to scalp after shampooing   fluticasone (FLONASE) 50 MCG/ACT nasal spray SPRAY 2 SPRAYS INTO EACH NOSTRIL EVERY DAY (Patient taking differently: Place 1 spray into both nostrils daily.)   Lutein 20 MG TABS Take 20 mg by mouth 2 (two) times daily.    Multiple Minerals-Vitamins (ADVANCED CALCIUM/D/MAGNESIUM) TABS Take 1 tablet by mouth 2 (two) times daily. includes boron and betaine   Multiple Vitamin (MULTIVITAMIN WITH MINERALS) TABS tablet Take 1 tablet by mouth daily.   Multiple Vitamins-Minerals (PRESERVISION AREDS 2 PO) Take 1 tablet by mouth 2 (two) times daily.   olopatadine (PATANOL) 0.1 % ophthalmic solution INSTILL 1 DROP INTO BOTH EYES TWICE A DAY (Patient taking differently: Place 1 drop into both eyes daily as needed for allergies.)   Omega 3 1000 MG CAPS Take 1,000 mg by mouth 2 (two) times daily.   polyethylene glycol powder (GLYCOLAX/MIRALAX) powder Take 17 g by mouth daily.    pregabalin (LYRICA) 25 MG capsule Take 1 capsule (25 mg total) by mouth 2 (two) times daily. TAKE 1 CAPSULE IN THE MORNING AND TAKE 1 CAPSULE IN THE EVENING (Patient  taking differently: Take 25-50 mg by mouth See admin instructions. Take 25 mg by mouth in the morning and 50 mg at bedtime)   PRESCRIPTION MEDICATION Apply 1 Application topically at bedtime. Azelaic acid 15%, metronidazole 1%, ivermectin 1%   tiZANidine (ZANAFLEX) 2 MG tablet TAKE 1 TABLET (2 MG TOTAL) BY MOUTH AT BEDTIME AS NEEDED. (Patient taking differently: Take 2 mg by mouth at bedtime.)     Allergies  Allergen Reactions   Crab [Shellfish Allergy] Swelling and Other (See Comments)    Soft-shelled crab   Doxycycline Rash    Social History   Socioeconomic History   Marital status: Married    Spouse name: Not on file   Number of children: 2   Years of education: Not on file   Highest education level: Bachelor's degree (e.g., BA, AB, BS)  Occupational History   Occupation: cpa/retired    Associate Professor: Retired  Tobacco Use   Smoking status: Never   Smokeless tobacco: Never  Vaping Use   Vaping status: Never Used  Substance and Sexual Activity   Alcohol use: Yes    Alcohol/week: 7.0 standard drinks of alcohol    Types: 7 Glasses of wine per week    Comment: 1 glass wine with dinner most evenings   Drug use: No   Sexual activity: Yes    Birth control/protection: Post-menopausal  Other Topics Concern   Not on file  Social History Narrative   Married 54 years in 2015. 2 kids. 4 grandkids.       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 Previously [employer].    Social Determinants of Health   Financial Resource Strain: Low Risk  (07/09/2020)   Received from [facility] visits prior to 11/30/2022., [facility] visits prior to 11/30/2022.   Overall Financial Resource Strain (CARDIA)    Difficulty of Paying Living Expenses: Not hard at all  Food Insecurity: No Food Insecurity (07/17/2023)   Hunger Vital Sign    Worried About Running Out of Food in the Last Year: Never true    Ran Out of Food  in the Last Year: Never true  Transportation Needs: No Transportation Needs (07/17/2023)   PRAPARE - Administrator, Civil Service (Medical): No    Lack of Transportation (Non-Medical): No  Physical Activity: Unknown (07/09/2020)   Received from [facility] visits prior to 11/30/2022., [facility] visits prior to 11/30/2022.   Exercise Vital Sign    Days of Exercise per Week: 0 days    Minutes of Exercise per Session: Not on file  Stress: No Stress Concern Present (07/09/2020)   Received from [facility] visits prior to 11/30/2022., [facility] visits prior to 11/30/2022.   [employer] of Occupational Health - Occupational Stress Questionnaire    Feeling of Stress : Not at all  Social Connections: Unknown (07/11/2022)   Received from [employer] [employer] [facility]   Family and [employer] Support    Help with Day-to-Day Activities: Not on file    Lonely or Isolated: Not on file  Intimate Partner Violence: Not At Risk (07/17/2023)   Humiliation, Afraid, Rape, and Kick questionnaire    Fear of Current or Ex-Partner: No    Emotionally Abused: No    Physically Abused: No    Sexually Abused: No     Review of Systems: General: negative for chills, fever, night sweats or weight changes.  Cardiovascular: negative for chest pain, dyspnea on exertion, edema, orthopnea, palpitations, paroxysmal nocturnal dyspnea or shortness of breath Dermatological: negative for rash Respiratory: negative for cough or wheezing Urologic: negative for hematuria Abdominal: negative for nausea, vomiting, diarrhea, bright red blood per rectum, melena, or hematemesis Neurologic: negative for visual changes, syncope, or dizziness All other systems reviewed and are otherwise negative except as noted above.    Blood pressure 136/82, pulse (!) 59, height 5\' 1"  (1.549 m), weight 145 lb 12.8 oz (66.1  kg), SpO2 96%.  General appearance: alert and no distress Neck: no adenopathy, no carotid bruit, no JVD, supple, symmetrical, trachea midline, and thyroid not enlarged, symmetric, no tenderness/mass/nodules Lungs: clear to auscultation bilaterally Heart: regular rate and rhythm, S1, S2 normal, no murmur, click,  rub or gallop Extremities: extremities normal, atraumatic, no cyanosis or edema Pulses: 2+ and symmetric Skin: Skin color, texture, turgor normal. No rashes or lesions Neurologic: Grossly normal  EKG EKG Interpretation Date/Time:  Tuesday August 05 2023 11:03:15 EST Ventricular Rate:  59 PR Interval:  212 QRS Duration:  100 QT Interval:  444 QTC Calculation: 439 R Axis:   -84  Text Interpretation: Sinus bradycardia with 1st degree A-V block Incomplete right bundle branch block Left anterior fascicular block Septal infarct , age undetermined When compared with ECG of 17-Jul-2023 11:42, PREVIOUS ECG IS PRESENT Confirmed by Nanetta Batty [phone]) on 08/05/2023 11:41:38 AM    ASSESSMENT AND PLAN:   Severe mitral regurgitation History of severe mitral regurgitation and 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  She had a TEE performed 02/13/2023 that revealed normal LV systolic function with the above valvular abnormality.  I did right left heart cath on her revealing normal coronary arteries.  I did review for her to Dr. Leafy Ro who suggested that she would need mitral valve replacement and tricuspid valve repair.  Given the fact that she was asymptomatic he was elected to treat her conservatively.  Obstructive sleep apnea History of obstructive sleep apnea on BiPAP     Runell Gess MD [facility], [facility] 08/05/2023 11:52 AM

## 2023-08-05 NOTE — Assessment & Plan Note (Signed)
History of obstructive sleep apnea on BiPAP 

## 2023-08-05 NOTE — Assessment & Plan Note (Signed)
History of PAF in the setting of right pneumothorax and chest tube placement converting to sinus rhythm after given 1 dose of Lopressor.  She was placed on a 30-day Zio patch.  She has had no recurrent symptoms.  Her EKG today revealed sinus rhythm.  At this point, I do not think she needs oral anticoagulation.  I suspect this was related to the stress of her pneumothorax.

## 2023-08-18 ENCOUNTER — Encounter: Payer: Self-pay | Admitting: Internal Medicine

## 2023-09-03 ENCOUNTER — Ambulatory Visit: Payer: Medicare Other | Attending: Cardiovascular Disease

## 2023-09-03 DIAGNOSIS — I493 Ventricular premature depolarization: Secondary | ICD-10-CM

## 2023-09-03 DIAGNOSIS — I4891 Unspecified atrial fibrillation: Secondary | ICD-10-CM

## 2023-09-03 DIAGNOSIS — R002 Palpitations: Secondary | ICD-10-CM

## 2023-09-05 ENCOUNTER — Telehealth: Payer: Self-pay | Admitting: Internal Medicine

## 2023-09-05 ENCOUNTER — Telehealth: Payer: Self-pay | Admitting: Cardiovascular Disease

## 2023-09-05 NOTE — Telephone Encounter (Signed)
Pt calling states she was supposed to have colon last July. Pt scheduled to see Dr. Rhea Belton 09/09/23 at 8:50am. Pt aware of appt.

## 2023-09-05 NOTE — Telephone Encounter (Signed)
Called and spoke to patient. Attempted to schedule appt with APP and she did not want to see an APP. Scheduled with Dr Allyson Sabal 12/6 at 10:00 am.

## 2023-09-05 NOTE — Telephone Encounter (Signed)
Inbound call from patient requesting to speak with nurse regarding a sooner appointment. Advised patient Dr. Rhea Belton is currently booked up and would not having anything available until Dr. Lauro Franklin March calendar opens up. Patient declined to be scheduled with an APP. Please advise, thank you.

## 2023-09-05 NOTE — Telephone Encounter (Signed)
Pt returning call to nurse about monitor results.

## 2023-09-09 ENCOUNTER — Encounter: Payer: Self-pay | Admitting: Internal Medicine

## 2023-09-09 ENCOUNTER — Ambulatory Visit (INDEPENDENT_AMBULATORY_CARE_PROVIDER_SITE_OTHER): Payer: Medicare Other | Admitting: Internal Medicine

## 2023-09-09 VITALS — BP 128/74 | HR 47 | Ht 61.0 in | Wt 146.2 lb

## 2023-09-09 DIAGNOSIS — Z860100 Personal history of colon polyps, unspecified: Secondary | ICD-10-CM | POA: Diagnosis not present

## 2023-09-09 DIAGNOSIS — Z791 Long term (current) use of non-steroidal anti-inflammatories (NSAID): Secondary | ICD-10-CM

## 2023-09-09 DIAGNOSIS — K219 Gastro-esophageal reflux disease without esophagitis: Secondary | ICD-10-CM

## 2023-09-09 DIAGNOSIS — R1013 Epigastric pain: Secondary | ICD-10-CM | POA: Diagnosis not present

## 2023-09-09 DIAGNOSIS — F1990 Other psychoactive substance use, unspecified, uncomplicated: Secondary | ICD-10-CM

## 2023-09-09 MED ORDER — PANTOPRAZOLE SODIUM 20 MG PO TBEC: 20.0000 mg | DELAYED_RELEASE_TABLET | Freq: Every day | ORAL | 2 refills | Status: DC

## 2023-09-09 NOTE — Progress Notes (Signed)
HPI: Discussed the use of AI scribe software for clinical note transcription with the patient, who gave verbal consent to proceed.  History of Present Illness   Tanya Leon is an 82 year old female with a history of multiple large flat sessile adenomas requiring piecemeal resection and eventual right hemicolectomy in July 2020, chronic constipation and indigestion who is here for follow-up.  She is here today with her husband.  She was last seen in July 2021 for her colonoscopy.    She is having an issue with an increase in indigestion symptoms over the past two to three years. The patient describes the discomfort as similar to heartburn, typically occurring postprandially, approximately three to four days a week. The patient manages the discomfort with Tums, which provides relief, and sometimes chooses to ignore the symptoms until she subsides.  The patient has been on a long-term regimen of Vitamin C and daily Excedrin for arthritis and occasional headaches. The patient reports no changes in the formulation or dosage of these medications. The patient also reports no issues with swallowing.  The patient's weight has remained stable, and bowel movements are regular with daily MiraLAX use. The patient remains relatively active but reports a decrease in activity due to difficulty walking, possibly related to arthritis and a mild case of scoliosis. The patient is currently undergoing physical therapy to improve mobility.  The patient has been on Lyrica and Cymbalta for an extended period, presumably for back pain and nerve-related issues. The patient also had a recent history of a collapsed lung in October of the current year, the cause of which remains undetermined. The patient underwent a chest tube insertion procedure for the right lung, which has since healed.  The patient had a colonoscopy approximately three and a half years ago, which was normal. However, the patient has a history of recurrent  polyps, some of which were larger and flat, making them harder to detect. The patient underwent surgery to remove a persistent flat polyp near the connection of the large and small intestine. The patient is currently considering whether to undergo another colonoscopy, weighing the potential risks and benefits. .  She did have a spontaneous pneumothorax in October requiring chest tube which has been removed.  It was queried whether this was related to her BiPAP for OSA.     Past Medical History:  Diagnosis Date   Allergy 1973   Seasonal   Arthritis    Blood transfusion without reported diagnosis 08/15/2008   Following hip replacement   Cancer (HCC)    hx of skin cancer    Cataract 1998   Cataract surgery both eyes   Colon polyps    adenomatous   Complication of anesthesia    " pt stated they gave me too much - kept having to be reminded to breathe    Constipation    past hx- no longer an issue    Depression    hx of    Diverticulosis    DIVERTICULOSIS, COLON 01/13/2009   Glaucoma 05-12-14   Headache    history of migraines   Heart murmur    benign, dagnosed around age 72,prophylactic antibiotic   Low back pain    Lumbar spondylolysis    Osteopenia    Pneumonia    hx of as a baby    Pulmonary hypertension (HCC)    monitored by cards Dr Allyson Sabal annually via echo    PVC's (premature ventricular contractions)    i have them periodically , Dr  Allyson Sabal had me cut back on caffeine and that helped    Rectocele    Sleep apnea    wears bi-pap, no oxygen    Tubular adenoma of colon    Wears hearing aid in both ears     Past Surgical History:  Procedure Laterality Date   ABDOMINAL HERNIA REPAIR Right    incisional   ABDOMINAL HYSTERECTOMY     with ovaries   BACK SURGERY  2009   L4-L5 with rods   BIOPSY  11/13/2018   Procedure: BIOPSY;  Surgeon: Beverley Fiedler, MD;  Location: [facility];  Service: Gastroenterology;;   BLADDER SUSPENSION N/A 04/09/2017   Procedure: TRANSVAGINAL  TAPE (TVT) PROCEDURE;  Surgeon: Carrington Clamp, MD;  Location: [facility];  Service: Gynecology;  Laterality: N/A;   CATARACT EXTRACTION, BILATERAL     cataract left lens replacement     2016   COLON SURGERY  2007   Rpr abscessed ruptured diverticuli   COLONOSCOPY     COLONOSCOPY N/A 08/29/2017   Procedure: COLONOSCOPY;  Surgeon: Beverley Fiedler, MD;  Location: [facility];  Service: Gastroenterology;  Laterality: N/A;   COLONOSCOPY WITH PROPOFOL N/A 11/13/2018   Procedure: COLONOSCOPY WITH PROPOFOL with Methylane Blue Injection;  Surgeon: Beverley Fiedler, MD;  Location: [facility];  Service: Gastroenterology;  Laterality: N/A;   CYSTOCELE REPAIR N/A 04/09/2017   Procedure: ANTERIOR REPAIR (CYSTOCELE);  Surgeon: Carrington Clamp, MD;  Location: [facility];  Service: Gynecology;  Laterality: N/A;   CYSTOSCOPY N/A 04/09/2017   Procedure: CYSTOSCOPY;  Surgeon: Carrington Clamp, MD;  Location: [facility];  Service: Gynecology;  Laterality: N/A;   EYE MUSCLE SURGERY     FRACTURE SURGERY  11-13-17   L wrist-open reduction internal fixation   GLAUCOMA SURGERY Left 05/12/14   HERNIA REPAIR  05-28-11   Hernia result of 2007 colon surg   incisional ab  05/28/11   JOINT REPLACEMENT     right hip replacement    LAPAROSCOPIC RIGHT HEMI COLECTOMY Right 04/30/2019   Procedure: LAPAROSCOPIC  RIGHT HEMI COLECTOMY;  Surgeon: Andria Meuse, MD;  Location: [facility];  Service: General;  Laterality: Right;   LUMBAR FUSION     L4-L5   PARTIAL KNEE ARTHROPLASTY Right    PARTIAL KNEE ARTHROPLASTY Left    perforated deverticular abscess/laporotomy and colostomy     depression after this   POLYPECTOMY N/A 08/29/2017   Procedure: POLYPECTOMY;  Surgeon: Beverley Fiedler, MD;  Location: [facility];  Service: Gastroenterology;  Laterality: N/A;   POLYPECTOMY  11/13/2018   Procedure: POLYPECTOMY;  Surgeon: Beverley Fiedler, MD;  Location: [facility];  Service: Gastroenterology;;   POLYPECTOMY     RECTOCELE REPAIR   01/13/2020   RIGHT/LEFT HEART CATH AND CORONARY ANGIOGRAPHY N/A 02/13/2023   Procedure: RIGHT/LEFT HEART CATH AND CORONARY ANGIOGRAPHY;  Surgeon: Tonny Bollman, MD;  Location: [facility];  Service: Cardiovascular;  Laterality: N/A;   SPINE SURGERY  02-16-08   Fusion L4L5   stribismus eye surgery Right    take down of colostomy     TEE WITHOUT CARDIOVERSION N/A 02/13/2023   Procedure: TRANSESOPHAGEAL ECHOCARDIOGRAM;  Surgeon: Wendall Stade, MD;  Location: [facility] INVASIVE CV LAB;  Service: Cardiovascular;  Laterality: N/A;   tonsillectomly     TONSILLECTOMY     TOTAL HIP ARTHROPLASTY Right    TOTAL KNEE REVISION Left 11/24/2015   Procedure: Conversion from left lateral unicompartmental knee arthroplasty to left total knee arthroplasty;  Surgeon: Kathryne Hitch,  MD;  Location: [facility];  Service: Orthopedics;  Laterality: Left;   TUBAL LIGATION  1976   WRIST SURGERY Right 03/27/2020    Outpatient Medications Prior to Visit  Medication Sig Dispense Refill   ascorbic acid (VITAMIN C) 1000 MG tablet Take 1,000 mg by mouth daily.     aspirin-acetaminophen-caffeine (EXCEDRIN MIGRAINE) 250-250-65 MG tablet Take 2 tablets by mouth every 6 (six) hours as needed for headache.      betamethasone, augmented, (DIPROLENE) 0.05 % lotion Apply 1 Application topically as needed (itchy ears).     Biotin 5000 MCG CAPS Take 5,000 mcg by mouth daily.     calcium carbonate (TUMS EX) 750 MG chewable tablet Chew 1 tablet by mouth daily as needed for heartburn.     carboxymethylcellulose (REFRESH PLUS) 0.5 % SOLN Place 1 drop into both eyes 3 (three) times daily as needed (Dry eyes).     Cholecalciferol (VITAMIN D3) 50 MCG (2000 UT) TABS Take 2,000 Units by mouth every evening.     clobetasol (OLUX) 0.05 % topical foam Apply 1 Application topically once a week.     cycloSPORINE (RESTASIS) 0.05 % ophthalmic emulsion Place 1 drop into both eyes 2 (two) times daily.      DULoxetine (CYMBALTA) 60 MG capsule  Take 60 mg by mouth daily.     estradiol (VIVELLE-DOT) 0.1 MG/24HR patch Place 1 patch onto the skin See admin instructions. Apply a new patch to the skin on Mondays and Fridays     EVENING PRIMROSE OIL PO Take 1,300 mg by mouth 2 (two) times daily.     fexofenadine (ALLEGRA) 180 MG tablet Take 180 mg by mouth at bedtime.     fluocinonide (LIDEX) 0.05 % external solution Apply 1 application  topically once a week. Applied to scalp after shampooing     fluticasone (FLONASE) 50 MCG/ACT nasal spray SPRAY 2 SPRAYS INTO EACH NOSTRIL EVERY DAY (Patient taking differently: Place 1 spray into both nostrils daily.) 48 mL 1   Lutein 20 MG TABS Take 20 mg by mouth 2 (two) times daily.      Multiple Minerals-Vitamins (ADVANCED CALCIUM/D/MAGNESIUM) TABS Take 1 tablet by mouth 2 (two) times daily. includes boron and betaine     Multiple Vitamin (MULTIVITAMIN WITH MINERALS) TABS tablet Take 1 tablet by mouth daily.     Multiple Vitamins-Minerals (PRESERVISION AREDS 2 PO) Take 1 tablet by mouth 2 (two) times daily.     olopatadine (PATANOL) 0.1 % ophthalmic solution INSTILL 1 DROP INTO BOTH EYES TWICE A DAY (Patient taking differently: Place 1 drop into both eyes daily as needed for allergies.) 15 mL 3   Omega 3 1000 MG CAPS Take 1,000 mg by mouth 2 (two) times daily.     polyethylene glycol powder (GLYCOLAX/MIRALAX) powder Take 17 g by mouth daily.      pregabalin (LYRICA) 25 MG capsule Take 1 capsule (25 mg total) by mouth 2 (two) times daily. TAKE 1 CAPSULE IN THE MORNING AND TAKE 1 CAPSULE IN THE EVENING (Patient taking differently: Take 25-50 mg by mouth See admin instructions. Take 25 mg by mouth in the morning and 50 mg at bedtime) 180 capsule 1   tiZANidine (ZANAFLEX) 2 MG tablet TAKE 1 TABLET (2 MG TOTAL) BY MOUTH AT BEDTIME AS NEEDED. (Patient taking differently: Take 2 mg by mouth at bedtime.) 90 tablet 1   PRESCRIPTION MEDICATION Apply 1 Application topically at bedtime. Azelaic acid 15%, metronidazole  1%, ivermectin 1%     No  facility-administered medications prior to visit.    Allergies  Allergen Reactions   Crab [Shellfish Allergy] Swelling and Other (See Comments)    Soft-shelled crab   Doxycycline Rash    Family History  Problem Relation Age of Onset   Alzheimer's disease Father    Heart disease Father        mitral valve replaced. unknown reason   Breast cancer Mother        61   Colon polyps Mother    Diabetes Mother    Heart disease Mother 48       CHF, MI 25   Heart attack Mother        x 2   Arthritis Mother    Cancer Mother    Hearing loss Mother    Hypertension Mother    Prostate cancer Brother    Cancer Brother    Hearing loss Brother    Hypertension Brother    Asthma Paternal Uncle    Hypertension Sister    Colon cancer Neg Hx    Esophageal cancer Neg Hx    Rectal cancer Neg Hx    Stomach cancer Neg Hx     Social History   Tobacco Use   Smoking status: Never   Smokeless tobacco: Never  Vaping Use   Vaping status: Never Used  Substance Use Topics   Alcohol use: Yes    Alcohol/week: 7.0 standard drinks of alcohol    Types: 7 Glasses of wine per week    Comment: 1 glass wine with dinner most evenings   Drug use: No    ROS: As per history of present illness, otherwise negative  BP 128/74   Pulse (!) 47   Ht 5\' 1"  (1.549 m)   Wt 146 lb 4 oz (66.3 kg)   SpO2 93%   BMI 27.63 kg/m  Gen: awake, alert, NAD HEENT: anicteric  Ext: no c/c/e Neuro: nonfocal   RELEVANT LABS AND IMAGING: CBC    Component Value Date/Time   WBC 5.8 07/20/2023 0321   RBC 4.25 07/20/2023 0321   HGB 12.9 07/20/2023 0321   HGB 13.3 01/22/2016 0000   HCT 39.2 07/20/2023 0321   HCT 40.9 01/22/2016 0000   PLT 135 (L) 07/20/2023 0321   MCV 92.2 07/20/2023 0321   MCV 89 01/22/2016 0000   MCH 30.4 07/20/2023 0321   MCHC 32.9 07/20/2023 0321   RDW 13.6 07/20/2023 0321   RDW 14.8 01/22/2016 0000   LYMPHSABS 1.3 07/20/2023 0321   LYMPHSABS 1.7 01/22/2016 0000    MONOABS 0.5 07/20/2023 0321   EOSABS 0.2 07/20/2023 0321   EOSABS 0.1 01/22/2016 0000   BASOSABS 0.0 07/20/2023 0321   BASOSABS 0.0 01/22/2016 0000    CMP     Component Value Date/Time   NA 138 07/18/2023 0518   K 3.8 07/18/2023 0518   CL 102 07/18/2023 0518   CO2 25 07/18/2023 0518   GLUCOSE 96 07/18/2023 0518   BUN 17 07/18/2023 0518   CREATININE 0.84 07/18/2023 0518   CALCIUM 9.2 07/18/2023 0518   PROT 6.1 (L) 07/17/2023 1145   PROT 6.5 02/28/2016 1524   ALBUMIN 3.7 07/17/2023 1145   ALBUMIN 4.4 02/28/2016 1524   AST 30 07/17/2023 1145   ALT 21 07/17/2023 1145   ALKPHOS 48 07/17/2023 1145   BILITOT 1.0 07/17/2023 1145   BILITOT 0.3 02/28/2016 1524   GFRNONAA >60 07/18/2023 0518   GFRAA >60 05/02/2019 0444    Results   RADIOLOGY Chest X-ray:  Pneumothorax, right side (07/17/2023)  DIAGNOSTIC Colonoscopy: Completely normal (03/2020)      ASSESSMENT/PLAN:     Dyspepsia Increased frequency of indigestion, described as heartburn, occurring 3-4 days a week, usually postprandial. No dysphagia or regurgitation. Tums provides relief. Chronic use of Excedrin. -Start Pantoprazole 20mg  daily for gastric protection and symptom relief. -Patient to notify me if this fails to improve symptoms  Chronic Aspirin Use Daily use of Excedrin (aspirin formulation) for arthritis and occasional headaches. Discussed potential for gastritis and ulcer development. -Pantoprazole 20mg  daily for gastric protection.  Colonoscopy Follow-up History of recurrent polyps, last colonoscopy 3.5 years ago was normal. Patient is 82 years old, and risks/benefits of another colonoscopy were discussed. -Consult with patient's cardiologist and pulmonologist regarding risk/benefit of another colonoscopy. -Consider deferring colonoscopy based on consultation and patient preference.  Follow-up in 4-5 months to assess efficacy of Pantoprazole and discuss colonoscopy decision.      30 minutes total  spent today including patient facing time, coordination of care, reviewing medical history/procedures/pertinent radiology studies, and documentation of the encounter.   VW:UJWJX, Denny Levy, Md [address] [place],  [place] [address]

## 2023-09-09 NOTE — Patient Instructions (Signed)
We have sent the following medications to your pharmacy for you to pick up at your convenience: Pantoprazole   Follow up in 5-6 months. Office will contact you to schedule. If you have not heard from our office in 1-2 months to schedule, please contact our office at [phone] to schedule follow up appointment.   Discuss with your Cardiologist and Pulmonologist regarding risk of having another Colonoscopy.   _______________________________________________________  If your blood pressure at your visit was 140/90 or greater, please contact your primary care physician to follow up on this.  _______________________________________________________  If you are age 73 or older, your body mass index should be between 23-30. Your Body mass index is 27.63 kg/m. If this is out of the aforementioned range listed, please consider follow up with your Primary Care Provider.  If you are age 65 or younger, your body mass index should be between 19-25. Your Body mass index is 27.63 kg/m. If this is out of the aformentioned range listed, please consider follow up with your Primary Care Provider.   ________________________________________________________  The [facility] providers would like to encourage you to use [facility] to communicate with providers for non-urgent requests or questions.  Due to long hold times on the telephone, sending your provider a message by [facility] may be a faster and more efficient way to get a response.  Please allow 48 business hours for a response.  Please remember that this is for non-urgent requests.  _______________________________________________________  It was a pleasure to see you today!  Dr Erick Blinks

## 2023-09-10 ENCOUNTER — Encounter: Payer: Self-pay | Admitting: Cardiovascular Disease

## 2023-09-10 ENCOUNTER — Ambulatory Visit: Payer: Medicare Other | Attending: Cardiovascular Disease | Admitting: Cardiovascular Disease

## 2023-09-10 VITALS — BP 140/80 | HR 42 | Ht 61.0 in | Wt 145.6 lb

## 2023-09-10 DIAGNOSIS — R002 Palpitations: Secondary | ICD-10-CM | POA: Diagnosis present

## 2023-09-10 NOTE — Patient Instructions (Signed)
Medication Instructions:  Your physician recommends that you continue on your current medications as directed. Please refer to the Current Medication list given to you today.  *If you need a refill on your cardiac medications before your next appointment, please call your pharmacy*   Follow-Up: At [facility], you and your health needs are our priority.  As part of our continuing mission to provide you with exceptional heart care, we have created designated Provider Care Teams.  These Care Teams include your primary Cardiologist (physician) and Advanced Practice Providers (APPs -  Physician Assistants and Nurse Practitioners) who all work together to provide you with the care you need, when you need it.  We recommend signing up for the patient portal called "MyChart".  Sign up information is provided on this After Visit Summary.  MyChart is used to connect with patients for Virtual Visits (Telemedicine).  Patients are able to view lab/test results, encounter notes, upcoming appointments, etc.  Non-urgent messages can be sent to your provider as well.   To learn more about what you can do with MyChart, go to [email or web address].    Your next appointment:   6-7 month(s)  Provider:   Quay Burow, MD

## 2023-09-10 NOTE — Progress Notes (Signed)
Tanya Leon returns today accompanied by her husband Tanya Leon for review of her event monitor performed 09/04/2023 that showed occasional PVCs, more frequent PACs (11% burden).  Her resting heart rate is in the 40s.  She is on a beta-blocker.  She does not have symptoms referable to bradycardia.  She says that she has noticed slow gradual increase in dyspnea but this is not really affected her quality of life currently.  She does have severe MR and TR, function and normal coronary arteries by cath thought not to be a mitral valve repair candidate but rather replacement.  She was seen by Dr. Leafy Ro.  She is scheduled to have a 2D echo in May.  I will see her back in June in follow-up.  Runell Gess, M.D., FACP, [facility], [facility] [address] [place], [place]  [address]  [phone] 09/10/2023 10:44 AM

## 2023-09-22 ENCOUNTER — Ambulatory Visit: Payer: Medicare Other | Admitting: Cardiovascular Disease

## 2023-10-30 ENCOUNTER — Other Ambulatory Visit: Payer: Self-pay | Admitting: Internal Medicine

## 2023-11-07 ENCOUNTER — Telehealth: Payer: Self-pay | Admitting: Cardiovascular Disease

## 2023-11-07 NOTE — Telephone Encounter (Signed)
 Patient is requesting call back in regards to lab work.

## 2023-11-07 NOTE — Telephone Encounter (Signed)
 Called patient with no answer. Left message to call back. Will relay below message to patient>   Court Dorn PARAS, MD  Cv Div Nl [phone] minutes ago (3:06 PM)   She has had a PTX before requiring a chest tube. She needs to have this re addressed if it's a new finding. The mildly elevated BNP is prob a result of her severe MR resulting in mild fluid over load.

## 2023-11-07 NOTE — Telephone Encounter (Signed)
 Called and spoke to patient. Verified name and DOB. Patient called to inform Dr Court that she was seen by Pulmonary and had labs and a chest Xray done and both were abnormal.   Chest Xray shows 35% pneumothorax.   B-Type Natriuretic Peptide (BNP) <100 pg/mL 259 High

## 2023-11-07 NOTE — Telephone Encounter (Signed)
 Pt returning call

## 2023-11-07 NOTE — Telephone Encounter (Signed)
 Called and spoke to patient. Verified name and DOB.Below message relayed to patient per Dr Katheryne Pane. Patient stated she has a follow up with the Pulmonologist. No further questions at this time.

## 2024-01-02 ENCOUNTER — Telehealth: Payer: Self-pay | Admitting: Cardiovascular Disease

## 2024-01-02 NOTE — Telephone Encounter (Signed)
 Pavero, Christopher, RPH  You1 minute ago (11:50 AM)   Yes, ok to take   You  Cv Div Pharmd2 hours ago (9:26 AM)    Please advise Thank you   Patient identification verified by 2 forms. Marilynn Rail, RN    Called and spoke to patient  Informed patient okay to take prednisone  Patient verbalized understanding, no questions at this time

## 2024-01-02 NOTE — Telephone Encounter (Signed)
 Pt c/o medication issue:  1. Name of Medication: Prednisone  2. How are you currently taking this medication (dosage and times per day)?   3. Are you having a reaction (difficulty breathing--STAT)?   4. What is your medication issue? Patient said her Orthopedic prescribed this medicine. She wants to know if it will be alright for her to take this medicine?i

## 2024-01-25 ENCOUNTER — Other Ambulatory Visit: Payer: Self-pay | Admitting: Internal Medicine

## 2024-02-02 ENCOUNTER — Ambulatory Visit (HOSPITAL_COMMUNITY): Payer: Medicare Other | Attending: Cardiovascular Disease

## 2024-02-02 DIAGNOSIS — I340 Nonrheumatic mitral (valve) insufficiency: Secondary | ICD-10-CM | POA: Insufficient documentation

## 2024-02-02 DIAGNOSIS — I272 Pulmonary hypertension, unspecified: Secondary | ICD-10-CM | POA: Insufficient documentation

## 2024-02-02 LAB — ECHOCARDIOGRAM COMPLETE: S' Lateral: 2.22 cm

## 2024-02-06 ENCOUNTER — Other Ambulatory Visit: Payer: Self-pay | Admitting: Internal Medicine

## 2024-02-25 ENCOUNTER — Other Ambulatory Visit: Payer: Self-pay | Admitting: Internal Medicine

## 2024-03-08 ENCOUNTER — Encounter: Payer: Self-pay | Admitting: Cardiovascular Disease

## 2024-03-08 ENCOUNTER — Ambulatory Visit: Payer: Medicare Other | Attending: Cardiology | Admitting: Cardiovascular Disease

## 2024-03-08 VITALS — BP 148/90 | HR 60 | Ht 61.0 in | Wt 146.4 lb

## 2024-03-08 DIAGNOSIS — I493 Ventricular premature depolarization: Secondary | ICD-10-CM | POA: Insufficient documentation

## 2024-03-08 DIAGNOSIS — I340 Nonrheumatic mitral (valve) insufficiency: Secondary | ICD-10-CM | POA: Diagnosis present

## 2024-03-08 DIAGNOSIS — I272 Pulmonary hypertension, unspecified: Secondary | ICD-10-CM | POA: Insufficient documentation

## 2024-03-08 DIAGNOSIS — I480 Paroxysmal atrial fibrillation: Secondary | ICD-10-CM | POA: Diagnosis present

## 2024-03-08 MED ORDER — POTASSIUM CHLORIDE CRYS ER 10 MEQ PO TBCR: 10.0000 meq | EXTENDED_RELEASE_TABLET | Freq: Every day | ORAL | 3 refills | Status: AC

## 2024-03-08 MED ORDER — FUROSEMIDE 20 MG PO TABS: 20.0000 mg | ORAL_TABLET | ORAL | 3 refills | Status: AC

## 2024-03-08 NOTE — Assessment & Plan Note (Signed)
 History of palpitations in the past with event monitor performed 09/04/2023 showed occasional PVCs and more frequent PACs.  She was somewhat bradycardic and is not on a beta-blocker.

## 2024-03-08 NOTE — Assessment & Plan Note (Signed)
 Mild pulmonary hypertension by 2D echo performed 02/02/2024.

## 2024-03-08 NOTE — Assessment & Plan Note (Signed)
 Severe mitral regurgitation by echo performed 02/02/2024 and moderate tricuspid vegetation.  LV size and function were normal.  She did have a right left heart cath by Dr. Arlester Ladd revealing normal coronary arteries.  I did refer her to Dr. Honey Lusty who did not think she was a candidate for mitral valve repair but rather replacement with tricuspid valve repair.  She had a BMP ordered 4 months ago that was in the mid 200 range.  She has noticed some mild increase in dyspnea.  I am going to begin her on low-dose furosemide.

## 2024-03-08 NOTE — Progress Notes (Signed)
 03/08/2024 SULLIVAN BLASING   12/19/1940  [id number]  Primary Physician Hoover Luz, MD Primary Cardiologist: Avanell Leigh MD Bennye Bravo, [place]  HPI:  Tanya Leon is a 83 y.o.   thin-appearing married Caucasian female mother of 2, grandmother, 4 grandchildren was accompanied who is husband Gust Leghorn is a patient of mine as well.  I last saw her in the office 09/10/2023.Aaron Aas She was she has no cardiac risk factors other than her mother died of a myocardial infarction at age 69. She has never had a heart attack or stroke. She denies chest pain or shortness of breath. She has noticed palpitations over the last month with some occasional dizziness. EKG performed by her PCP revealed PVCs as did her EKG today. She was drinking 2 cups of coffee a day which she has changed to decaf. Thyroid  function tests were normal.      Because of palpitations she did have a monitor which failed to show PVCs.  She says that her palpitations have resolved since I last saw her.  She had a 2D echo performed 07/27/2019 showed normal LV size and function, mild bileaflet prolapse with mild to moderate MR, and diastolic dysfunction with a RV systolic blood pressure wound which was mildly elevated at 35 mmHg.  She does exercise, walks up 3 flights of stairs at [facility] which is where she and her husband live does complain of some mild shortness of breath with with moderate exertion.    She apparently injured her ankle and no longer is playing pickle ball.   She has been diagnosed with severe obstructive sleep apnea and has been treated with BiPAP.  She feels clinically improved.  A  2D echo performed 08/02/2020 revealed normal LV systolic function, grade 1 diastolic dysfunction with only mild MR and posterior leaflet prolapse.  Her RV systolic pressure was 37 mmHg.       She is very active and is completely asymptomatic denying chest pain or shortness of breath.  She did have a 2D echo performed 01/03/22 revealing  normal LV size and function with severe mitral regurgitation and posterior leaflet prolapse with mild to moderate left atrial dilatation and stable mild pulmonary hypertension with an RV systolic pressure of 35 mmHg.   As result of this I referred her to Dr. Arlester Ladd for consideration of MitraClip being.  She had a right left heart cath that showed clean coronary arteries without evidence of pulm hypertension and normal filling pressures.  TEE performed Dr. Stann Earnest revealed severe MR and TR with prolapse of the P2 and P3 leaflets as well as prolapse of the tricuspid leaflets.  Her LV size and function were normal.  She was then sent to Dr. Honey Lusty who felt that she was not a mitral valve repair candidate but rather atrial valve replacement and tricuspid valve repair.  She is very active and is relatively asymptomatic.  The consensus was watchful waiting and conservative therapy at this time.   She was admitted to the hospital on 07/17/2023 with acute onset of chest pain and shortness of breath.  Her chest CT showed a large right pneumothorax.  Chest tube was inserted and removed 4 days later with full expansion.  She did have a brief episode of PAF which resolved with administration of 1 dose of Lopressor .  She wore a 30-day Zio patch and her EKG today reveals sinus rhythm.  Since I saw her 6 months ago she did have a 2D  echo performed 02/02/2024 revealed normal LV size and function, grade 1 diastolic dysfunction with moderate to severe MR and moderate TR.  She did have a BMP ordered by her pulmonologist 4 months ago that was not in the mid 200 range.  She does complain of mildly increased dyspnea.  Current Meds  Medication Sig   ascorbic acid (VITAMIN C) 1000 MG tablet Take 1,000 mg by mouth daily.   aspirin -acetaminophen -caffeine  (EXCEDRIN  MIGRAINE) 250-250-65 MG tablet Take 2 tablets by mouth every 6 (six) hours as needed for headache.    betamethasone, augmented, (DIPROLENE) 0.05 % lotion Apply 1  Application topically as needed (itchy ears).   Biotin 5000 MCG CAPS Take 5,000 mcg by mouth daily.   calcium  carbonate (TUMS EX) 750 MG chewable tablet Chew 1 tablet by mouth daily as needed for heartburn.   carboxymethylcellulose (REFRESH PLUS) 0.5 % SOLN Place 1 drop into both eyes 3 (three) times daily as needed (Dry eyes).   Cholecalciferol  (VITAMIN D3) 50 MCG (2000 UT) TABS Take 2,000 Units by mouth every evening.   clobetasol (OLUX) 0.05 % topical foam Apply 1 Application topically once a week.   cycloSPORINE  (RESTASIS ) 0.05 % ophthalmic emulsion Place 1 drop into both eyes 2 (two) times daily.    DULoxetine  (CYMBALTA ) 60 MG capsule Take 60 mg by mouth daily.   estradiol  (VIVELLE -DOT) 0.1 MG/24HR patch Place 1 patch onto the skin See admin instructions. Apply a new patch to the skin on Mondays and Fridays   EVENING PRIMROSE OIL PO Take 1,300 mg by mouth 2 (two) times daily.   fexofenadine (ALLEGRA) 180 MG tablet Take 180 mg by mouth at bedtime.   fluticasone  (FLONASE ) 50 MCG/ACT nasal spray SPRAY 2 SPRAYS INTO EACH NOSTRIL EVERY DAY (Patient taking differently: Place 1 spray into both nostrils daily.)   Lutein  20 MG TABS Take 20 mg by mouth 2 (two) times daily.    Multiple Minerals-Vitamins (ADVANCED CALCIUM /D/MAGNESIUM ) TABS Take 1 tablet by mouth 2 (two) times daily. includes boron and betaine   Multiple Vitamin (MULTIVITAMIN WITH MINERALS) TABS tablet Take 1 tablet by mouth daily.   Multiple Vitamins-Minerals (PRESERVISION AREDS 2 PO) Take 1 tablet by mouth 2 (two) times daily.   Omega 3 1000 MG CAPS Take 1,000 mg by mouth 2 (two) times daily.   pantoprazole  (PROTONIX ) 20 MG tablet Take 1 tablet (20 mg total) by mouth daily.   polyethylene glycol powder (GLYCOLAX /MIRALAX ) powder Take 17 g by mouth daily.    pregabalin  (LYRICA ) 25 MG capsule Take 1 capsule (25 mg total) by mouth 2 (two) times daily. TAKE 1 CAPSULE IN THE MORNING AND TAKE 1 CAPSULE IN THE EVENING (Patient taking  differently: Take 25-50 mg by mouth See admin instructions. Take 25 mg by mouth in the morning and 50 mg at bedtime)   tiZANidine  (ZANAFLEX ) 2 MG tablet TAKE 1 TABLET (2 MG TOTAL) BY MOUTH AT BEDTIME AS NEEDED. (Patient taking differently: Take 2 mg by mouth at bedtime.)     Allergies  Allergen Reactions   Crab [Shellfish Allergy] Swelling and Other (See Comments)    Soft-shelled crab   Doxycycline Rash    Social History   Socioeconomic History   Marital status: Married    Spouse name: Not on file   Number of children: 2   Years of education: Not on file   Highest education level: Bachelor's degree (e.g., BA, AB, BS)  Occupational History   Occupation: cpa/retired    Associate Professor: Retired  Tobacco Use  Smoking status: Never   Smokeless tobacco: Never  Vaping Use   Vaping status: Never Used  Substance and Sexual Activity   Alcohol  use: Yes    Alcohol /week: 7.0 standard drinks of alcohol     Types: 7 Glasses of wine per week    Comment: 1 glass wine with dinner most evenings   Drug use: No   Sexual activity: Yes    Birth control/protection: Post-menopausal  Other Topics Concern   Not on file  Social History Narrative   Married 54 years in 2015. 2 kids. 4 grandkids.       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 Previously [employer].    Social Drivers of Corporate investment banker Strain: Low Risk  (01/01/2024)   Received from [facility]   Overall Financial Resource Strain (CARDIA)    Difficulty of Paying Living Expenses: Not hard at all  Food Insecurity: No Food Insecurity (01/01/2024)   [facility]   Hunger Vital Sign    Worried About Running Out of Food in the Last Year: Never true    Ran Out of Food in the Last Year: Never true  Transportation Needs: No Transportation Needs (01/01/2024)   Received from [facility] - Transportation    Lack of Transportation (Medical): No    Lack of  Transportation (Non-Medical): No  Physical Activity: Insufficiently Active (01/01/2024)   [place] [facility]   Exercise Vital Sign    Days of Exercise per Week: 3 days    Minutes of Exercise per Session: 10 min  Stress: No Stress Concern Present (01/01/2024)   Received from [facility] - Occupational Stress Questionnaire    Feeling of Stress : Not at all  Social Connections: Socially Integrated (01/01/2024)   Received from [facility]   Social Network    How would you rate your social network (family, work, friends)?: Good participation with social networks  Intimate Partner Violence: Not At Risk (01/01/2024)   Received from [facility]   HITS    Over the last 12 months how often did your partner physically hurt you?: Never    Over the last 12 months how often did your partner insult you or talk down to you?: Never    Over the last 12 months how often did your partner threaten you with physical harm?: Never    Over the last 12 months how often did your partner scream or curse at you?: Never     Review of Systems: General: negative for chills, fever, night sweats or weight changes.  Cardiovascular: negative for chest pain, dyspnea on exertion, edema, orthopnea, palpitations, paroxysmal nocturnal dyspnea or shortness of breath Dermatological: negative for rash Respiratory: negative for cough or wheezing Urologic: negative for hematuria Abdominal: negative for nausea, vomiting, diarrhea, bright red blood per rectum, melena, or hematemesis Neurologic: negative for visual changes, syncope, or dizziness All other systems reviewed and are otherwise negative except as noted above.    Blood pressure (!) 148/90, pulse 60, height 5\' 1"  (1.549 m), weight 146 lb 6.4 oz (66.4 kg), SpO2 98%.  General appearance: alert and no distress Neck: no adenopathy, no carotid bruit, no JVD, supple, symmetrical, trachea midline, and thyroid  not enlarged,  symmetric, no tenderness/mass/nodules Lungs: clear to auscultation bilaterally Heart: regular rate and rhythm, S1, S2 normal, no murmur, click, rub or gallop Extremities: extremities normal, atraumatic, no  cyanosis or edema Pulses: 2+ and symmetric Skin: Skin color, texture, turgor normal. No rashes or lesions Neurologic: Grossly normal  EKG EKG Interpretation Date/Time:  Monday March 08 2024 14:44:25 EDT Ventricular Rate:  69 PR Interval:  218 QRS Duration:  102 QT Interval:  430 QTC Calculation: 460 R Axis:   -70  Text Interpretation: Sinus rhythm with marked sinus arrhythmia with 1st degree A-V block Left anterior fascicular block Anterior infarct (cited on or before 05-Aug-2023) When compared with ECG of 05-Aug-2023 11:03, Incomplete right bundle branch block is no longer Present Questionable change in initial forces of Anteroseptal leads Confirmed by Lauro Portal [id number]) on 03/08/2024 3:04:29 PM    ASSESSMENT AND PLAN:   PVC's (premature ventricular contractions) History of palpitations in the past with event monitor performed 09/04/2023 showed occasional PVCs and more frequent PACs.  She was somewhat bradycardic and is not on a beta-blocker.  Pulmonary hypertension, unspecified (HCC) Mild pulmonary hypertension by 2D echo performed 02/02/2024.  Severe mitral regurgitation Severe mitral regurgitation by echo performed 02/02/2024 and moderate tricuspid vegetation.  LV size and function were normal.  She did have a right left heart cath by Dr. Arlester Ladd revealing normal coronary arteries.  I did refer her to Dr. Honey Lusty who did not think she was a candidate for mitral valve repair but rather replacement with tricuspid valve repair.  She had a BMP ordered 4 months ago that was in the mid 200 range.  She has noticed some mild increase in dyspnea.  I am going to begin her on low-dose furosemide.     Avanell Leigh MD FACP,FACC,FAHA, [facility] 03/08/2024 3:20 PM

## 2024-03-08 NOTE — Patient Instructions (Signed)
 Medication Instructions:  Your physician has recommended you make the following change in your medication:   -Start furosemide (lasix) 20mg  once every other day.  -Start potassium chloride 10meq daily.  *If you need a refill on your cardiac medications before your next appointment, please call your pharmacy*  Lab Work: Your physician recommends that you return for lab work in: 10-14 days for BMET  If you have labs (blood work) drawn today and your tests are completely normal, you will receive your results only by: MyChart Message (if you have MyChart) OR A paper copy in the mail If you have any lab test that is abnormal or we need to change your treatment, we will call you to review the results.   Follow-Up: At [facility], you and your health needs are our priority.  As part of our continuing mission to provide you with exceptional heart care, our providers are all part of one team.  This team includes your primary Cardiologist (physician) and Advanced Practice Providers or APPs (Physician Assistants and Nurse Practitioners) who all work together to provide you with the care you need, when you need it.  Your next appointment:   3 month(s)  Provider:   Lauro Portal, MD    We recommend signing up for the patient portal called "MyChart".  Sign up information is provided on this After Visit Summary.  MyChart is used to connect with patients for Virtual Visits (Telemedicine).  Patients are able to view lab/test results, encounter notes, upcoming appointments, etc.  Non-urgent messages can be sent to your provider as well.   To learn more about what you can do with MyChart, go to [email or web address].

## 2024-03-25 ENCOUNTER — Encounter: Payer: Self-pay | Admitting: Cardiovascular Disease

## 2024-06-08 ENCOUNTER — Encounter: Payer: Self-pay | Admitting: Cardiovascular Disease

## 2024-06-08 ENCOUNTER — Ambulatory Visit: Attending: Cardiovascular Disease | Admitting: Cardiovascular Disease

## 2024-06-08 VITALS — BP 140/82 | HR 68 | Ht 61.0 in | Wt 148.9 lb

## 2024-06-08 DIAGNOSIS — I340 Nonrheumatic mitral (valve) insufficiency: Secondary | ICD-10-CM | POA: Insufficient documentation

## 2024-06-08 NOTE — Progress Notes (Signed)
 06/08/2024 ALICIANNA Leon   07-19-1941  [id number]  Primary Physician Feliciano Devoria LABOR, MD Primary Cardiologist: Dorn JINNY Lesches MD [place] [place], [place]  HPI:  Tanya Leon is a 83 y.o.   thin-appearing married Caucasian female mother of 2, grandmother, 4 grandchildren was accompanied who is husband Tanya Leon is a patient of mine as well.  I last saw her in the office 03/08/2024.Tanya Leon She was she has no cardiac risk factors other than her mother died of a myocardial infarction at age 73. She has never had a heart attack or stroke. She denies chest pain or shortness of breath. She has noticed palpitations over the last month with some occasional dizziness. EKG performed by her PCP revealed PVCs as did her EKG today. She was drinking 2 cups of coffee a day which she has changed to decaf. Thyroid  function tests were normal.      Because of palpitations she did have a monitor which failed to show PVCs.  She says that her palpitations have resolved since I last saw her.  She had a 2D echo performed 07/27/2019 showed normal LV size and function, mild bileaflet prolapse with mild to moderate MR, and diastolic dysfunction with a RV systolic blood pressure wound which was mildly elevated at 35 mmHg.  She does exercise, walks up 3 flights of stairs at [facility] which is where she and her husband live does complain of some mild shortness of breath with with moderate exertion.    She apparently injured her ankle and no longer is playing pickle ball.   She has been diagnosed with severe obstructive sleep apnea and has been treated with BiPAP.  She feels clinically improved.  A  2D echo performed 08/02/2020 revealed normal LV systolic function, grade 1 diastolic dysfunction with only mild MR and posterior leaflet prolapse.  Her RV systolic pressure was 37 mmHg.       She is very active and is completely asymptomatic denying chest pain or shortness of breath.  She did have a 2D echo performed 01/03/22 revealing  normal LV size and function with severe mitral regurgitation and posterior leaflet prolapse with mild to moderate left atrial dilatation and stable mild pulmonary hypertension with an RV systolic pressure of 35 mmHg.   As result of this I referred her to Dr. Wonda for consideration of MitraClip being.  She had a right left heart cath that showed clean coronary arteries without evidence of pulm hypertension and normal filling pressures.  TEE performed Dr. Delford revealed severe MR and TR with prolapse of the P2 and P3 leaflets as well as prolapse of the tricuspid leaflets.  Her LV size and function were normal.  She was then sent to Dr. Maryjane who felt that she was not a mitral valve repair candidate but rather atrial valve replacement and tricuspid valve repair.  She is very active and is relatively asymptomatic.  The consensus was watchful waiting and conservative therapy at this time.   She was admitted to the hospital on 07/17/2023 with acute onset of chest pain and shortness of breath.  Her chest CT showed a large right pneumothorax.  Chest tube was inserted and removed 4 days later with full expansion.  She did have a brief episode of PAF which resolved with administration of 1 dose of Lopressor .  She wore a 30-day Zio patch and her EKG today reveals sinus rhythm.   Since I saw her in the office 3 months ago I  did begin her on furosemide  20 mg every other day which resulted in marked improvement in her dyspnea.  She does have severe MR with recent echo performed 02/02/2024 with severely dilated left atrium and normal LV systolic function.  Her BNP was 259 back in February.   Current Meds  Medication Sig   ascorbic acid (VITAMIN C) 1000 MG tablet Take 1,000 mg by mouth daily.   aspirin -acetaminophen -caffeine  (EXCEDRIN  MIGRAINE) 250-250-65 MG tablet Take 2 tablets by mouth every 6 (six) hours as needed for headache.    betamethasone, augmented, (DIPROLENE) 0.05 % lotion Apply 1 Application topically  as needed (itchy ears).   calcium  carbonate (TUMS EX) 750 MG chewable tablet Chew 1 tablet by mouth daily as needed for heartburn.   carboxymethylcellulose (REFRESH PLUS) 0.5 % SOLN Place 1 drop into both eyes 3 (three) times daily as needed (Dry eyes).   Cholecalciferol  (VITAMIN D3) 50 MCG (2000 UT) TABS Take 2,000 Units by mouth every evening.   clobetasol (OLUX) 0.05 % topical foam Apply 1 Application topically once a week.   cycloSPORINE  (RESTASIS ) 0.05 % ophthalmic emulsion Place 1 drop into both eyes 2 (two) times daily.    DULoxetine  (CYMBALTA ) 60 MG capsule Take 60 mg by mouth daily.   estradiol  (VIVELLE -DOT) 0.1 MG/24HR patch Place 1 patch onto the skin See admin instructions. Apply a new patch to the skin on Mondays and Fridays   EVENING PRIMROSE OIL PO Take 1,300 mg by mouth 2 (two) times daily.   fexofenadine (ALLEGRA) 180 MG tablet Take 180 mg by mouth at bedtime.   fluticasone  (FLONASE ) 50 MCG/ACT nasal spray SPRAY 2 SPRAYS INTO EACH NOSTRIL EVERY DAY   furosemide  (LASIX ) 20 MG tablet Take 1 tablet (20 mg total) by mouth every other day.   Lutein  20 MG TABS Take 20 mg by mouth 2 (two) times daily.    Multiple Minerals-Vitamins (ADVANCED CALCIUM /D/MAGNESIUM ) TABS Take 1 tablet by mouth 2 (two) times daily. includes boron and betaine   Multiple Vitamins-Minerals (PRESERVISION AREDS 2 PO) Take 1 tablet by mouth 2 (two) times daily.   Omega 3 1000 MG CAPS Take 1,000 mg by mouth 2 (two) times daily.   pantoprazole  (PROTONIX ) 20 MG tablet Take 1 tablet (20 mg total) by mouth daily.   polyethylene glycol powder (GLYCOLAX /MIRALAX ) powder Take 17 g by mouth daily.    potassium chloride  (KLOR-CON  M) 10 MEQ tablet Take 1 tablet (10 mEq total) by mouth daily.   pregabalin  (LYRICA ) 25 MG capsule Take 1 capsule (25 mg total) by mouth 2 (two) times daily. TAKE 1 CAPSULE IN THE MORNING AND TAKE 1 CAPSULE IN THE EVENING (Patient taking differently: Take 25 mg by mouth 2 (two) times daily. TAKE 1  CAPSULE IN THE MORNING AND TAKE 1 CAPSULE AT NOON AND 2 CAPSULES IN THE EVENING.)   tiZANidine  (ZANAFLEX ) 2 MG tablet TAKE 1 TABLET (2 MG TOTAL) BY MOUTH AT BEDTIME AS NEEDED. (Patient taking differently: Take 2 mg by mouth at bedtime.)     Allergies  Allergen Reactions   Crab [Shellfish Allergy] Swelling and Other (See Comments)    Soft-shelled crab   Doxycycline Rash    Social History   Socioeconomic History   Marital status: Married    Spouse name: Not on file   Number of children: 2   Years of education: Not on file   Highest education level: Bachelor's degree (e.g., BA, AB, BS)  Occupational History   Occupation: cpa/retired    Associate Professor: Retired  Tobacco Use   Smoking status: Never   Smokeless tobacco: Never  Vaping Use   Vaping status: Never Used  Substance and Sexual Activity   Alcohol  use: Yes    Alcohol /week: 7.0 standard drinks of alcohol     Types: 7 Glasses of wine per week    Comment: 1 glass wine with dinner most evenings   Drug use: No   Sexual activity: Yes    Birth control/protection: Post-menopausal  Other Topics Concern   Not on file  Social History Narrative   Married 54 years in 2015. 2 kids. 4 grandkids.       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 Previously [employer].    Social Drivers of Corporate investment banker Strain: Low Risk  (01/01/2024)   Received from [facility]   Overall Financial Resource Strain (CARDIA)    Difficulty of Paying Living Expenses: Not hard at all  Food Insecurity: No Food Insecurity (01/01/2024)   Received from [facility]   Hunger Vital Sign    Within the past 12 months, you worried that your food would run out before you got the money to buy more.: Never true    Within the past 12 months, the food you bought just didn't last and you didn't have money to get more.: Never true  Transportation Needs: No Transportation Needs (01/01/2024)   Received from [facility] - Transportation    Lack of Transportation (Medical): No    Lack of Transportation (Non-Medical): No  Physical Activity: Insufficiently Active (01/01/2024)   [facility]   Exercise Vital Sign    On average, how many days per week do you engage in moderate to strenuous exercise (like a brisk walk)?: 3 days    On average, how many minutes do you engage in exercise at this level?: 10 min  Stress: No Stress Concern Present (01/01/2024)   Received from [facility] - Occupational Stress Questionnaire    Feeling of Stress : Not at all  Social Connections: Socially Integrated (01/01/2024)   Received from [facility]   Social Network    How would you rate your social network (family, work, friends)?: Good participation with social networks  Intimate Partner Violence: Not At Risk (01/01/2024)   Received from [facility]   HITS    Over the last 12 months how often did your partner physically hurt you?: Never    Over the last 12 months how often did your partner insult you or talk down to you?: Never    Over the last 12 months how often did your partner threaten you with physical harm?: Never    Over the last 12 months how often did your partner scream or curse at you?: Never     Review of Systems: General: negative for chills, fever, night sweats or weight changes.  Cardiovascular: negative for chest pain, dyspnea on exertion, edema, orthopnea, palpitations, paroxysmal nocturnal dyspnea or shortness of breath Dermatological: negative for rash Respiratory: negative for cough or wheezing Urologic: negative for hematuria Abdominal: negative for nausea, vomiting, diarrhea, bright red blood per rectum, melena, or hematemesis Neurologic: negative for visual changes, syncope, or dizziness All other systems reviewed and are otherwise negative except as noted above.    Blood pressure (!) 140/82, pulse 68, height 5' 1 (1.549 m), weight  148 lb 14.4 oz (67.5 kg), SpO2 98%.  General appearance: alert and no distress Neck: no adenopathy, no carotid bruit, no JVD, supple, symmetrical, trachea midline, and thyroid  not enlarged, symmetric, no tenderness/mass/nodules Lungs: clear to auscultation bilaterally Heart: regular rate and rhythm, S1, S2 normal, no murmur, click, rub or gallop Extremities: extremities normal, atraumatic, no cyanosis or edema Pulses: 2+ and symmetric Skin: Skin color, texture, turgor normal. No rashes or lesions Neurologic: Grossly normal  EKG not performed today      ASSESSMENT AND PLAN:   Severe mitral regurgitation Ms. Riles returns today for follow-up of her severe mitral regurgitation.  I last saw her in the office 03/08/2024.  She had a BNP performed in February that was 259 suggesting mild volume overload.  I began her on furosemide  20 mg every other day which completely resolved her symptoms of dyspnea.     Dorn DOROTHA Lesches MD FACP,FACC,FAHA, [facility] 06/08/2024 10:30 AM

## 2024-06-08 NOTE — Assessment & Plan Note (Signed)
 Tanya Leon returns today for follow-up of her severe mitral regurgitation.  I last saw her in the office 03/08/2024.  She had a BNP performed in February that was 259 suggesting mild volume overload.  I began her on furosemide  20 mg every other day which completely resolved her symptoms of dyspnea.

## 2024-06-08 NOTE — Patient Instructions (Signed)
 Medication Instructions:  Your physician recommends that you continue on your current medications as directed. Please refer to the Current Medication list given to you today.  *If you need a refill on your cardiac medications before your next appointment, please call your pharmacy*   Follow-Up: At [facility], you and your health needs are our priority.  As part of our continuing mission to provide you with exceptional heart care, our providers are all part of one team.  This team includes your primary Cardiologist (physician) and Advanced Practice Providers or APPs (Physician Assistants and Nurse Practitioners) who all work together to provide you with the care you need, when you need it.  Your next appointment:   6 month(s)  Provider:   Dorn Lesches, MD

## 2024-08-17 ENCOUNTER — Telehealth: Payer: Self-pay | Admitting: Internal Medicine

## 2024-08-17 NOTE — Telephone Encounter (Signed)
 Pt states she has been having issues with regurgitation and is afraid of possible aspiration. Pt scheduled to see Alan Coombs PA 08/19/24 at 8:40am, pt aware of appt.

## 2024-08-17 NOTE — Telephone Encounter (Signed)
 Patient states she has been having regurgitation severely at night. States she does not believe she can wait until January to be seen. Please advise, thank you

## 2024-08-18 NOTE — Progress Notes (Unsigned)
 08/19/2024 Tanya Leon [id number] 24-May-1941  Referring provider: Feliciano Devoria LABOR, MD Primary GI doctor: Dr. Albertus  ASSESSMENT AND PLAN:  GERD worsening with episode of acid reflux, nocturnal Denies dysphagia, melena, weight loss, AB pain Chronic Excedrin  BID, once glass of wine with dinner nightly,  2012 CTAP W anterior abdominal wall defect, large amount of stool in the distended colon suggesting constipation otherwise unremarkable unremarkable stomach duodenum, gallbladder liver On pantoprazole  20 mg daily, on tums daily - Increased pantoprazole  to 20 mg twice daily for 2-3 months. - Consider famotidine at night if symptoms persist. - Provided Reflux Gourmet samples. - Advised to avoid eating 2-3 hours before bed. - Recommended reducing wine intake and taking Excedrin  with food. - Instructed to elevate the head of the bed by 3 inches. - no alarm symptoms, can consider EGD if symptoms worsen or do not improve, has follow up with Dr. Albertus in Jan  Chronic constipation Denies constipation  Thrombocytopenia intermittently since 2020 with unremarkable liver function 2012 CTAP W normal liver, spleen 04/28/2024 platelets 140 10/15/2023 platelets 122 - order AB US  to evaluate liver/spleen - if abnormal, consider elastography/autoimmune/work up  Personal history of flat sessile adenomatous polyps Status post right hemicolectomy July 2020 No recall colonoscopy due to age  Severe mitral regurgitation with prolapse 01/28/2023 cardiac catheterization normal coronary arteries, moderate MR 01/2024 EF 60-65%, grade 1, elevated elevated pulmonary artery pressure, moderate to severe mitral stenosis On lasix  every other day with improvement of SOB   Patient Care Team: Feliciano Devoria LABOR, MD as PCP - General (Family Medicine) Court Dorn PARAS, MD as PCP - Cardiology (Cardiology) Cleatus Collar, MD as Consulting Physician (Ophthalmology) Elner Arley LABOR, MD as Consulting  Physician (Ophthalmology) Sarrah Browning, MD as Consulting Physician (Obstetrics and Gynecology) Court Dorn PARAS, MD as Consulting Physician (Cardiology) Pyrtle, Gordy HERO, MD as Consulting Physician (Gastroenterology) Robinson Pao, MD as Consulting Physician (Dermatology) Vernetta Lonni GRADE, MD as Consulting Physician (Orthopedic Surgery) Gershon Donnice SAUNDERS, DPM as Consulting Physician (Podiatry) Carolee Acosta as Consulting Physician (Dentistry) Thaddeus Locus, MD as Consulting Physician (Otolaryngology)  HISTORY OF PRESENT ILLNESS: 83 y.o. female with a past medical history listed below presents for evaluation of GERD.   Last seen in the office by Dr. Albertus 09/09/2023 for chronic constipation and reflux.  Discussed the use of AI scribe software for clinical note transcription with the patient, who gave verbal consent to proceed.  History of Present Illness   Tanya Leon is an 83 year old female with gastroesophageal reflux disease who presents with episodes of nighttime reflux.  She has experienced two significant episodes of nighttime reflux over the past few months. The first episode occurred two to three months ago after consuming Coke, resulting in nighttime reflux and burning in her throat, which resolved after she discontinued Coke. The second episode happened recently after a delayed dinner that included fried food, hamburger, and red wine, leading to waking up with choking and burning reflux. She managed this by eating a slice of bread and sleeping in a chair before returning to bed without further issues.  She occasionally wakes up with a burning sensation in her throat in the morning, which resolves during the day. No difficulty swallowing, food getting stuck, regurgitation, dark stools, weight loss, or abdominal pain. Her current medication regimen includes pantoprazole  20 mg once daily in the morning. She also takes Excedrin  twice a day.  Her social history includes regular  consumption of wine, typically one glass  with dinner most evenings. She has recently had her Lyrica  dose increased for leg pain, which she attributes to nerve pain, and has been started on a small dose of Lasix  every other day by another physician.  She has a history of a cardiac catheterization and echocardiogram, which are monitored annually. She reports no significant shortness of breath since starting Lasix , except when walking extensively. She has a history of low platelets, which have been stable over time, and a past CT scan in 2012 showed fatty liver and a large amount of stool.        She  reports that she has never smoked. She has never used smokeless tobacco. She reports current alcohol  use of about 7.0 standard drinks of alcohol  per week. She reports that she does not use drugs.  RELEVANT GI HISTORY, IMAGING AND LABS: Results   LABS Platelets (PLT): Low  RADIOLOGY CT abdomen and pelvis (2012): Fatty liver, large amount of stool  DIAGNOSTIC Cardiac catheterization: Moderate findings      CBC    Component Value Date/Time   WBC 5.8 07/20/2023 0321   RBC 4.25 07/20/2023 0321   HGB 12.9 07/20/2023 0321   HGB 13.3 01/22/2016 0000   HCT 39.2 07/20/2023 0321   HCT 40.9 01/22/2016 0000   PLT 135 (L) 07/20/2023 0321   MCV 92.2 07/20/2023 0321   MCV 89 01/22/2016 0000   MCH 30.4 07/20/2023 0321   MCHC 32.9 07/20/2023 0321   RDW 13.6 07/20/2023 0321   RDW 14.8 01/22/2016 0000   LYMPHSABS 1.3 07/20/2023 0321   LYMPHSABS 1.7 01/22/2016 0000   MONOABS 0.5 07/20/2023 0321   EOSABS 0.2 07/20/2023 0321   EOSABS 0.1 01/22/2016 0000   BASOSABS 0.0 07/20/2023 0321   BASOSABS 0.0 01/22/2016 0000   No results for input(s): HGB in the last 8760 hours.  CMP     Component Value Date/Time   NA 138 07/18/2023 0518   K 3.8 07/18/2023 0518   CL 102 07/18/2023 0518   CO2 25 07/18/2023 0518   GLUCOSE 96 07/18/2023 0518   BUN 17 07/18/2023 0518   CREATININE 0.84 07/18/2023 0518    CALCIUM  9.2 07/18/2023 0518   PROT 6.1 (L) 07/17/2023 1145   PROT 6.5 02/28/2016 1524   ALBUMIN 3.7 07/17/2023 1145   ALBUMIN 4.4 02/28/2016 1524   AST 30 07/17/2023 1145   ALT 21 07/17/2023 1145   ALKPHOS 48 07/17/2023 1145   BILITOT 1.0 07/17/2023 1145   BILITOT 0.3 02/28/2016 1524   GFRNONAA >60 07/18/2023 0518   GFRAA >60 05/02/2019 0444      Latest Ref Rng & Units 07/17/2023   11:45 AM 04/27/2019    9:38 AM 07/01/2018   10:11 AM  Hepatic Function  Total Protein 6.5 - 8.1 g/dL 6.1  6.4  7.1   Albumin 3.5 - 5.0 g/dL 3.7  4.1  4.3   AST 15 - 41 U/L 30  18  15    ALT 0 - 44 U/L 21  17  12    Alk Phosphatase 38 - 126 U/L 48  58  55   Total Bilirubin 0.3 - 1.2 mg/dL 1.0  0.6  1.0       Current Medications:   Current Outpatient Medications (Endocrine & Metabolic):    estradiol  (VIVELLE -DOT) 0.1 MG/24HR patch, Place 1 patch onto the skin See admin instructions. Apply a new patch to the skin on Mondays and Fridays  Current Outpatient Medications (Cardiovascular):    furosemide  (LASIX ) 20 MG  tablet, Take 1 tablet (20 mg total) by mouth every other day.  Current Outpatient Medications (Respiratory):    fexofenadine (ALLEGRA) 180 MG tablet, Take 180 mg by mouth at bedtime.   fluticasone  (FLONASE ) 50 MCG/ACT nasal spray, SPRAY 2 SPRAYS INTO EACH NOSTRIL EVERY DAY  Current Outpatient Medications (Analgesics):    aspirin -acetaminophen -caffeine  (EXCEDRIN  MIGRAINE) 250-250-65 MG tablet, Take 2 tablets by mouth every 6 (six) hours as needed for headache.    Current Outpatient Medications (Other):    ascorbic acid (VITAMIN C) 1000 MG tablet, Take 1,000 mg by mouth daily.   betamethasone, augmented, (DIPROLENE) 0.05 % lotion, Apply 1 Application topically as needed (itchy ears).   Biotin 5000 MCG CAPS, Take 5,000 mcg by mouth daily.   calcium  carbonate (TUMS EX) 750 MG chewable tablet, Chew 1 tablet by mouth daily as needed for heartburn.   carboxymethylcellulose (REFRESH PLUS) 0.5 %  SOLN, Place 1 drop into both eyes 3 (three) times daily as needed (Dry eyes).   Cholecalciferol  (VITAMIN D3) 50 MCG (2000 UT) TABS, Take 2,000 Units by mouth every evening.   clobetasol (OLUX) 0.05 % topical foam, Apply 1 Application topically once a week.   cycloSPORINE  (RESTASIS ) 0.05 % ophthalmic emulsion, Place 1 drop into both eyes 2 (two) times daily.    DULoxetine  (CYMBALTA ) 60 MG capsule, Take 60 mg by mouth daily.   EVENING PRIMROSE OIL PO, Take 1,300 mg by mouth 2 (two) times daily.   Lutein  20 MG TABS, Take 20 mg by mouth 2 (two) times daily.    Multiple Minerals-Vitamins (ADVANCED CALCIUM /D/MAGNESIUM ) TABS, Take 1 tablet by mouth 2 (two) times daily. includes boron and betaine   Multiple Vitamins-Minerals (PRESERVISION AREDS 2 PO), Take 1 tablet by mouth 2 (two) times daily.   Omega 3 1000 MG CAPS, Take 1,000 mg by mouth 2 (two) times daily.   polyethylene glycol powder (GLYCOLAX /MIRALAX ) powder, Take 17 g by mouth daily.    potassium chloride  (KLOR-CON  M) 10 MEQ tablet, Take 1 tablet (10 mEq total) by mouth daily.   pregabalin  (LYRICA ) 25 MG capsule, Take 1 capsule (25 mg total) by mouth 2 (two) times daily. TAKE 1 CAPSULE IN THE MORNING AND TAKE 1 CAPSULE IN THE EVENING (Patient taking differently: Take 50 mg by mouth 2 (two) times daily. TAKE 1 CAPSULE IN THE MORNING AND TAKE 2 CAPSULES AT BEDTIME.)   tiZANidine  (ZANAFLEX ) 2 MG tablet, TAKE 1 TABLET (2 MG TOTAL) BY MOUTH AT BEDTIME AS NEEDED. (Patient taking differently: Take 2 mg by mouth at bedtime.)   pantoprazole  (PROTONIX ) 20 MG tablet, Take 1 tablet (20 mg total) by mouth 2 (two) times daily before a meal.  Medical History:  Past Medical History:  Diagnosis Date   Allergy 1973   Seasonal   Arthritis    Blood transfusion without reported diagnosis 08/15/2008   Following hip replacement   Cancer (HCC)    hx of skin cancer    Cataract 1998   Cataract surgery both eyes   Colon polyps    adenomatous   Complication of  anesthesia     pt stated they gave me too much - kept having to be reminded to breathe    Constipation    past hx- no longer an issue    Depression    hx of    Diverticulosis    DIVERTICULOSIS, COLON 01/13/2009   Glaucoma 05-12-14   Headache    history of migraines   Heart murmur    benign, dagnosed around  age 37,prophylactic antibiotic   Low back pain    Lumbar spondylolysis    Osteopenia    Pneumonia    hx of as a baby    Pulmonary hypertension (HCC)    monitored by cards Dr Court annually via echo    PVC's (premature ventricular contractions)    i have them periodically , Dr Court had me cut back on caffeine  and that helped    Rectocele    Sleep apnea    wears bi-pap, no oxygen    Tubular adenoma of colon    Wears hearing aid in both ears    Allergies:  Allergies  Allergen Reactions   Crab [Shellfish Allergy] Swelling and Other (See Comments)    Soft-shelled crab   Doxycycline Rash     Surgical History:  She  has a past surgical history that includes tonsillectomly; Abdominal hysterectomy; perforated deverticular abscess/laporotomy and colostomy; take down of colostomy; Lumbar fusion; Abdominal hernia repair (Right); incisional ab (05/28/11); Partial knee arthroplasty (Right); Eye muscle surgery; Partial knee arthroplasty (Left); stribismus eye surgery (Right); Glaucoma surgery (Left, 05/12/14); cataract left lens replacement; Joint replacement; Total knee revision (Left, 11/24/2015); Total hip arthroplasty (Right); Back surgery (2009); Tonsillectomy; Cataract extraction, bilateral; Colonoscopy; Bladder suspension (N/A, 04/09/2017); Cystoscopy (N/A, 04/09/2017); Cystocele repair (N/A, 04/09/2017); Colonoscopy (N/A, 08/29/2017); polypectomy (N/A, 08/29/2017); Spine surgery (02-16-08); Fracture surgery (11-13-17); Tubal ligation (1976); Hernia repair (05-28-11); Colonoscopy with propofol  (N/A, 11/13/2018); biopsy (11/13/2018); polypectomy (11/13/2018); Laparoscopic right hemi colectomy  (Right, 04/30/2019); Colon surgery (2007); Polypectomy; Wrist surgery (Right, 03/27/2020); Rectocele repair (01/13/2020); TEE without cardioversion (N/A, 02/13/2023); and RIGHT/LEFT HEART CATH AND CORONARY ANGIOGRAPHY (N/A, 02/13/2023). Family History:  Her family history includes Alzheimer's disease in her father; Arthritis in her mother; Asthma in her paternal uncle; Breast cancer in her mother; Cancer in her brother and mother; Colon polyps in her mother; Diabetes in her mother; Hearing loss in her brother and mother; Heart attack in her mother; Heart disease in her father; Heart disease (age of onset: 77) in her mother; Hypertension in her brother, mother, and sister; Prostate cancer in her brother.  REVIEW OF SYSTEMS  : All other systems reviewed and negative except where noted in the History of Present Illness.  PHYSICAL EXAM: BP 118/78   Pulse 69   Ht 5' 1 (1.549 m)   Wt 147 lb (66.7 kg)   SpO2 98%   BMI 27.78 kg/m  Physical Exam   GENERAL APPEARANCE: Well nourished, in no apparent distress. HEENT: No cervical lymphadenopathy, unremarkable thyroid , sclerae anicteric, conjunctiva pink. RESPIRATORY: Respiratory effort normal, breath sounds clear and equal bilaterally without rales, rhonchi, or wheezing. CARDIO: Regular rate and rhythm with no murmurs, rubs, or gallops, peripheral pulses intact. ABDOMEN: Soft, non-distended, active bowel sounds in all four quadrants, non-tender to palpation, no rebound, no mass appreciated. RECTAL: Declines. MUSCULOSKELETAL: Full range of motion, normal gait, without edema. SKIN: Dry, intact without rashes or lesions. No jaundice. NEURO: Alert, oriented, no focal deficits. PSYCH: Cooperative, normal mood and affect.      Alan JONELLE Coombs, PA-C 10:32 AM

## 2024-08-19 ENCOUNTER — Ambulatory Visit (INDEPENDENT_AMBULATORY_CARE_PROVIDER_SITE_OTHER): Admitting: Physician Assistant

## 2024-08-19 ENCOUNTER — Other Ambulatory Visit: Payer: Self-pay | Admitting: Physician Assistant

## 2024-08-19 ENCOUNTER — Encounter: Payer: Self-pay | Admitting: Physician Assistant

## 2024-08-19 VITALS — BP 118/78 | HR 69 | Ht 61.0 in | Wt 147.0 lb

## 2024-08-19 DIAGNOSIS — Z860100 Personal history of colon polyps, unspecified: Secondary | ICD-10-CM | POA: Diagnosis not present

## 2024-08-19 DIAGNOSIS — D696 Thrombocytopenia, unspecified: Secondary | ICD-10-CM

## 2024-08-19 DIAGNOSIS — F1990 Other psychoactive substance use, unspecified, uncomplicated: Secondary | ICD-10-CM | POA: Diagnosis not present

## 2024-08-19 DIAGNOSIS — K219 Gastro-esophageal reflux disease without esophagitis: Secondary | ICD-10-CM

## 2024-08-19 MED ORDER — PANTOPRAZOLE SODIUM 20 MG PO TBEC: 20.0000 mg | DELAYED_RELEASE_TABLET | Freq: Two times a day (BID) | ORAL | 0 refills | Status: DC

## 2024-08-19 NOTE — Patient Instructions (Signed)
 You have been scheduled for an abdominal ultrasound at [facility] on Monday, 08-30-24 at 9:00am. Please arrive 15 minutes prior to your appointment for registration. Make certain not to have anything to eat or drink 6 hours prior to your appointment. Should you need to reschedule your appointment, please contact radiology at [phone]. This test typically takes about 30 minutes to perform.   We have sent the following medications to your pharmacy for you to pick up at your convenience: Protonix   Please take your proton pump inhibitor medication, pantoprazole  20 mg twice a day for 2-3 months, then go back to once a day in the morning. Please take this medication 30 minutes to 1 hour before meals- this makes it more effective.  After you go back to once a day you can do over the counter pepcid 20 mg at night Avoid spicy and acidic foods Avoid fatty foods Limit your intake of coffee, tea, alcohol , and carbonated drinks Work to maintain a healthy weight Keep the head of the bed elevated at least 3 inches with blocks or a wedge pillow if you are having any nighttime symptoms Stay upright for 2 hours after eating Avoid meals and snacks three to four hours before bedtime  Reflux Gourmet Rescue - we are giving you Samples today  It is an ALGINATE THERAPY which is the only intervention that works to safeguard the esophagus by creating a protective barrier that actually stops reflux from happening. -The general directions for use are as stated on the packaging: Take 1 teaspoon (5 ml), or more as needed or as directed by your physician, after meals and before bed. -These general directions address the most common times for reflux to occur, but our Rescue products may be taken anytime. Some individuals may take our product preemptively, when they know they will suffer from reflux, or as needed - when discomfort arises. (If taken around food, it should be consumed last.) -You do not have to take  1 teaspoon (5 ml) of the product. While one teaspoon (5ml) may be the perfect average amount to relieve reflux suffering in some, others may require more or less. You may adjust the amount of Mint Chocolate Rescue and Vanilla Caramel Rescue to the lowest amount necessary to meet your individual needs to improve your quality of life. -You may dilute the product if it is too viscous for you to consume. Keep in mind, however, that the thickness of the product was formulated to provide optimal coating and protection of your throat and esophagus. Though diluting the product is possible, it may reduce the protective function and/or length of action. -This can be used in conjunction with reflux medications and lifestyle changes.  100% ALL-NATURAL  Paraben FREE, glycerin FREE, & potassium FREE  Made entirely from all-natural ingredients considered safe for children and during pregnancy  No known side effects  All-natural flavor Gluten FREE  Allergen FREE  Vegan  Can find more information here: nameseizer.co.nz  You have a follow up appointment with Dr. Albertus on 10-13-24 at 8:50 am. Please come 15 minutes early for registration. IF you need to reschedule please call us  as soon as possible at [phone].  Thank you for entrusting me with your care and for choosing [facility], Alan Coombs, P.A.-C   _______________________________________________________  If your blood pressure at your visit was 140/90 or greater, please contact your primary care physician to follow up on this.  _______________________________________________________  If you are age 69 or older, your body mass  index should be between 23-30. Your Body mass index is 27.78 kg/m. If this is out of the aforementioned range listed, please consider follow up with your Primary Care Provider.  If you are age 66 or younger, your body mass index should be between 19-25. Your Body mass  index is 27.78 kg/m. If this is out of the aformentioned range listed, please consider follow up with your Primary Care Provider.   ________________________________________________________  The [facility] providers would like to encourage you to use MYCHART to communicate with providers for non-urgent requests or questions.  Due to long hold times on the telephone, sending your provider a message by [facility] may be a faster and more efficient way to get a response.  Please allow 48 business hours for a response.  Please remember that this is for non-urgent requests.  _______________________________________________________  [facility] is using a team-based approach to care.  Your team is made up of your doctor and two to three APPS. Our APPS (Nurse Practitioners and Physician Assistants) work with your physician to ensure care continuity for you. They are fully qualified to address your health concerns and develop a treatment plan. They communicate directly with your gastroenterologist to care for you. Seeing the Advanced Practice Practitioners on your physician's team can help you by facilitating care more promptly, often allowing for earlier appointments, access to diagnostic testing, procedures, and other specialty referrals.

## 2024-08-20 ENCOUNTER — Telehealth: Payer: Self-pay | Admitting: Physician Assistant

## 2024-08-20 NOTE — Telephone Encounter (Signed)
 Ok to just sent in 40mg  Pantoprazole  for once a day ?(in light of insurance not paying for twice a day)

## 2024-08-20 NOTE — Telephone Encounter (Signed)
 Patient called stating insurance will not cover pantoprazole  twice a day but will cover 40 mg. Requesting prescription change. Please advise, thank you

## 2024-08-23 MED ORDER — PANTOPRAZOLE SODIUM 40 MG PO TBEC: 40.0000 mg | DELAYED_RELEASE_TABLET | Freq: Every day | ORAL | 1 refills | Status: AC

## 2024-08-23 NOTE — Addendum Note (Signed)
 Addended by: Ayona Yniguez K on: 08/23/2024 09:18 AM   Modules accepted: Orders

## 2024-08-23 NOTE — Telephone Encounter (Signed)
 Sent in once a day Pantoprazole  40mg 

## 2024-08-30 ENCOUNTER — Ambulatory Visit

## 2024-08-30 DIAGNOSIS — K219 Gastro-esophageal reflux disease without esophagitis: Secondary | ICD-10-CM

## 2024-08-30 DIAGNOSIS — D696 Thrombocytopenia, unspecified: Secondary | ICD-10-CM

## 2024-09-05 ENCOUNTER — Ambulatory Visit: Payer: Self-pay | Admitting: Physician Assistant

## 2024-09-06 ENCOUNTER — Telehealth (HOSPITAL_BASED_OUTPATIENT_CLINIC_OR_DEPARTMENT_OTHER): Payer: Self-pay

## 2024-09-06 ENCOUNTER — Telehealth: Payer: Self-pay

## 2024-09-06 NOTE — Telephone Encounter (Signed)
   Pre-operative Risk Assessment    Patient Name: Tanya Leon  DOB: 02-05-1941 MRN: [id number]   Date of last office visit: 06/08/24 with Dr. Court Date of next office visit: 12/06/24 with Dr. Court   Request for Surgical Clearance    Procedure:  Left eye strabismus  Date of Surgery:  Clearance 10/08/24                                 Surgeon:  Dr. Homer Surgeon's Group or Practice Name:  [facility] Phone number:  [phone] Fax number:  [phone]   Type of Clearance Requested:   - Medical    Type of Anesthesia:  Not Indicated   Additional requests/questions:    [name]   09/06/2024, 11:53 AM

## 2024-09-06 NOTE — Telephone Encounter (Signed)
Patient has been scheduled for televisit.

## 2024-09-06 NOTE — Telephone Encounter (Signed)
 Patient has been scheduled for televisit and consent is done     Patient Consent for Virtual Visit         Tanya Leon has provided verbal consent on 09/06/2024 for a virtual visit (video or telephone).   CONSENT FOR VIRTUAL VISIT FOR:  Tanya Leon  By participating in this virtual visit I agree to the following:  I hereby voluntarily request, consent and authorize [facility] and its employed or contracted physicians, physician assistants, nurse practitioners or other licensed health care professionals (the Practitioner), to provide me with telemedicine health care services (the "Services) as deemed necessary by the treating Practitioner. I acknowledge and consent to receive the Services by the Practitioner via telemedicine. I understand that the telemedicine visit will involve communicating with the Practitioner through live audiovisual communication technology and the disclosure of certain medical information by electronic transmission. I acknowledge that I have been given the opportunity to request an in-person assessment or other available alternative prior to the telemedicine visit and am voluntarily participating in the telemedicine visit.  I understand that I have the right to withhold or withdraw my consent to the use of telemedicine in the course of my care at any time, without affecting my right to future care or treatment, and that the Practitioner or I may terminate the telemedicine visit at any time. I understand that I have the right to inspect all information obtained and/or recorded in the course of the telemedicine visit and may receive copies of available information for a reasonable fee.  I understand that some of the potential risks of receiving the Services via telemedicine include:  Delay or interruption in medical evaluation due to technological equipment failure or disruption; Information transmitted may not be sufficient (e.g. poor resolution of images) to  allow for appropriate medical decision making by the Practitioner; and/or  In rare instances, security protocols could fail, causing a breach of personal health information.  Furthermore, I acknowledge that it is my responsibility to provide information about my medical history, conditions and care that is complete and accurate to the best of my ability. I acknowledge that Practitioner's advice, recommendations, and/or decision may be based on factors not within their control, such as incomplete or inaccurate data provided by me or distortions of diagnostic images or specimens that may result from electronic transmissions. I understand that the practice of medicine is not an exact science and that Practitioner makes no warranties or guarantees regarding treatment outcomes. I acknowledge that a copy of this consent can be made available to me via my patient portal [facility] MyChart), or I can request a printed copy by calling the office of [facility].    I understand that my insurance will be billed for this visit.   I have read or had this consent read to me. I understand the contents of this consent, which adequately explains the benefits and risks of the Services being provided via telemedicine.  I have been provided ample opportunity to ask questions regarding this consent and the Services and have had my questions answered to my satisfaction. I give my informed consent for the services to be provided through the use of telemedicine in my medical care

## 2024-09-06 NOTE — Telephone Encounter (Signed)
 Primary Cardiologist:Jonathan Court, MD   Preoperative team, please contact this patient and set up a phone call appointment for further preoperative risk assessment. Please obtain consent and complete medication review. Thank you for your help.   I confirm that guidance regarding antiplatelet and oral anticoagulation therapy has been completed and, if necessary, noted below (none requested).   I also confirmed the patient resides in the state of [place] . As per [facility] Board telemedicine laws, the patient must reside in the state in which the provider is licensed.   Tanya EMERSON Bane, NP-C  09/06/2024, 12:31 PM [address] [place], [place] [address] Office [phone] Fax [phone]

## 2024-09-28 ENCOUNTER — Ambulatory Visit: Admitting: Physician Assistant

## 2024-09-28 DIAGNOSIS — Z01810 Encounter for preprocedural cardiovascular examination: Secondary | ICD-10-CM | POA: Diagnosis not present

## 2024-09-28 NOTE — Progress Notes (Signed)
 "   Virtual Visit via Telephone Note   Because of Tanya Leon co-morbid illnesses, she is at least at moderate risk for complications without adequate follow up.  This format is felt to be most appropriate for this patient at this time.  Due to technical limitations with video connection (technology), today's appointment will be conducted as an audio only telehealth visit, and Tanya Leon verbally agreed to proceed in this manner.   All issues noted in this document were discussed and addressed.  No physical exam could be performed with this format.  Evaluation Performed:  Preoperative cardiovascular risk assessment _____________   Date:  09/28/2024   Patient ID:  Tanya Leon, DOB 06-07-1941, MRN [id number] Patient Location:  Home Provider location:   Office  Primary Care Provider:  Feliciano Devoria LABOR, MD Primary Cardiologist:  Dorn Lesches, MD  Chief Complaint / Patient Profile   83 y.o. y/o female with a h/o severe mitral regurgitation, depression, hypertension who is pending left eye strabismus on January 9th and presents today for telephonic preoperative cardiovascular risk assessment.  History of Present Illness    Tanya Leon is a 83 y.o. female who presents via audio/video conferencing for a telehealth visit today.  Pt was last seen in cardiology clinic on 06/08/24 by Dr. Lesches.  At that time Tanya Leon was doing well.  The patient is now pending procedure as outlined above. Since her last visit, she tells me that she has not had any chest pain or shortness of breath.  She does have some pain in her left leg which makes things more difficult.  However, she does surpass 4 METS on the DASI.  She thinks she may have a pinched nerve in her left leg.  No issues with stairs.  No medications indicated as needing held.  Past Medical History    Past Medical History:  Diagnosis Date   Allergy 1973   Seasonal   Arthritis    Blood transfusion without reported diagnosis  08/15/2008   Following hip replacement   Cancer (HCC)    hx of skin cancer    Cataract 1998   Cataract surgery both eyes   Colon polyps    adenomatous   Complication of anesthesia     pt stated they gave me too much - kept having to be reminded to breathe    Constipation    past hx- no longer an issue    Depression    hx of    Diverticulosis    DIVERTICULOSIS, COLON 01/13/2009   Glaucoma 05-12-14   Headache    history of migraines   Heart murmur    benign, dagnosed around age 1,prophylactic antibiotic   Low back pain    Lumbar spondylolysis    Osteopenia    Pneumonia    hx of as a baby    Pulmonary hypertension (HCC)    monitored by cards Dr Lesches annually via echo    PVC's (premature ventricular contractions)    i have them periodically , Dr Lesches had me cut back on caffeine  and that helped    Rectocele    Sleep apnea    wears bi-pap, no oxygen    Tubular adenoma of colon    Wears hearing aid in both ears    Past Surgical History:  Procedure Laterality Date   ABDOMINAL HERNIA REPAIR Right    incisional   ABDOMINAL HYSTERECTOMY     with ovaries   BACK SURGERY  2009   L4-L5 with rods   BIOPSY  11/13/2018   Procedure: BIOPSY;  Surgeon: Albertus Gordy HERO, MD;  Location: [facility];  Service: Gastroenterology;;   BLADDER SUSPENSION N/A 04/09/2017   Procedure: TRANSVAGINAL TAPE (TVT) PROCEDURE;  Surgeon: Sarrah Browning, MD;  Location: [facility];  Service: Gynecology;  Laterality: N/A;   CATARACT EXTRACTION, BILATERAL     cataract left lens replacement     2016   COLON SURGERY  2007   Rpr abscessed ruptured diverticuli   COLONOSCOPY     COLONOSCOPY N/A 08/29/2017   Procedure: COLONOSCOPY;  Surgeon: Albertus Gordy HERO, MD;  Location: [facility];  Service: Gastroenterology;  Laterality: N/A;   COLONOSCOPY WITH PROPOFOL  N/A 11/13/2018   Procedure: COLONOSCOPY WITH PROPOFOL  with Methylane Blue Injection;  Surgeon: Albertus Gordy HERO, MD;  Location: [facility];  Service:  Gastroenterology;  Laterality: N/A;   CYSTOCELE REPAIR N/A 04/09/2017   Procedure: ANTERIOR REPAIR (CYSTOCELE);  Surgeon: Sarrah Browning, MD;  Location: [facility];  Service: Gynecology;  Laterality: N/A;   CYSTOSCOPY N/A 04/09/2017   Procedure: CYSTOSCOPY;  Surgeon: Sarrah Browning, MD;  Location: [facility];  Service: Gynecology;  Laterality: N/A;   EYE MUSCLE SURGERY     FRACTURE SURGERY  11-13-17   L wrist-open reduction internal fixation   GLAUCOMA SURGERY Left 05/12/14   HERNIA REPAIR  05-28-11   Hernia result of 2007 colon surg   incisional ab  05/28/11   JOINT REPLACEMENT     right hip replacement    LAPAROSCOPIC RIGHT HEMI COLECTOMY Right 04/30/2019   Procedure: LAPAROSCOPIC  RIGHT HEMI COLECTOMY;  Surgeon: Teresa Lonni HERO, MD;  Location: [facility];  Service: General;  Laterality: Right;   LUMBAR FUSION     L4-L5   PARTIAL KNEE ARTHROPLASTY Right    PARTIAL KNEE ARTHROPLASTY Left    perforated deverticular abscess/laporotomy and colostomy     depression after this   POLYPECTOMY N/A 08/29/2017   Procedure: POLYPECTOMY;  Surgeon: Albertus Gordy HERO, MD;  Location: [facility];  Service: Gastroenterology;  Laterality: N/A;   POLYPECTOMY  11/13/2018   Procedure: POLYPECTOMY;  Surgeon: Albertus Gordy HERO, MD;  Location: [facility];  Service: Gastroenterology;;   POLYPECTOMY     RECTOCELE REPAIR  01/13/2020   RIGHT/LEFT HEART CATH AND CORONARY ANGIOGRAPHY N/A 02/13/2023   Procedure: RIGHT/LEFT HEART CATH AND CORONARY ANGIOGRAPHY;  Surgeon: Wonda Sharper, MD;  [facility];  Service: Cardiovascular;  Laterality: N/A;   SPINE SURGERY  02-16-08   Fusion L4L5   stribismus eye surgery Right    take down of colostomy     TEE WITHOUT CARDIOVERSION N/A 02/13/2023   Procedure: TRANSESOPHAGEAL ECHOCARDIOGRAM;  Surgeon: Delford Maude BROCKS, MD;  [facility] INV[facility] CV LAB;  Service: Cardiovascular;  Laterality: N/A;   tonsillectomly     TONSILLECTOMY     TOTAL HIP ARTHROPLASTY Right     TOTAL KNEE REVISION Left 11/24/2015   Procedure: Conversion from left lateral unicompartmental knee arthroplasty to left total knee arthroplasty;  Surgeon: Lonni CINDERELLA Poli, MD;  Location: [facility];  Service: Orthopedics;  Laterality: Left;   TUBAL LIGATION  1976   WRIST SURGERY Right 03/27/2020    Allergies  Allergies[1]  Home Medications    Prior to Admission medications  Medication Sig Start Date End Date Taking? Authorizing Provider  ascorbic acid (VITAMIN C) 1000 MG tablet Take 1,000 mg by mouth daily. 12/11/11   [provider]  aspirin -acetaminophen -caffeine  (EXCEDRIN  MIGRAINE) 250-250-65 MG tablet Take 2  tablets by mouth every 6 (six) hours as needed for headache.     [provider]  betamethasone, augmented, (DIPROLENE) 0.05 % lotion Apply 1 Application topically as needed (itchy ears). 11/29/22   [provider]  Biotin 5000 MCG CAPS Take 5,000 mcg by mouth daily.    [provider]  calcium  carbonate (TUMS EX) 750 MG chewable tablet Chew 1 tablet by mouth daily as needed for heartburn.    [provider]  carboxymethylcellulose (REFRESH PLUS) 0.5 % SOLN Place 1 drop into both eyes 3 (three) times daily as needed (Dry eyes).    [provider]  Cholecalciferol  (VITAMIN D3) 50 MCG (2000 UT) TABS Take 2,000 Units by mouth every evening.    [provider]  clobetasol (OLUX) 0.05 % topical foam Apply 1 Application topically once a week.    [provider]  cycloSPORINE  (RESTASIS ) 0.05 % ophthalmic emulsion Place 1 drop into both eyes 2 (two) times daily.     [provider]  DULoxetine  (CYMBALTA ) 60 MG capsule Take 60 mg by mouth daily.    [provider]  estradiol  (VIVELLE -DOT) 0.1 MG/24HR patch Place 1 patch onto the skin See admin instructions. Apply a new patch to the skin on Mondays and Fridays    [provider]  EVENING PRIMROSE OIL PO Take 1,300 mg by mouth 2 (two) times daily.     [provider]  fexofenadine (ALLEGRA) 180 MG tablet Take 180 mg by mouth at bedtime.    [provider]  fluticasone  (FLONASE ) 50 MCG/ACT nasal spray SPRAY 2 SPRAYS INTO EACH NOSTRIL EVERY DAY 05/18/20   Katrinka Garnette KIDD, MD  furosemide  (LASIX ) 20 MG tablet Take 1 tablet (20 mg total) by mouth every other day. 03/08/24   Court Dorn PARAS, MD  Lutein  20 MG TABS Take 20 mg by mouth 2 (two) times daily.     [provider]  Multiple Minerals-Vitamins (ADVANCED CALCIUM /D/MAGNESIUM ) TABS Take 1 tablet by mouth 2 (two) times daily. includes boron and betaine    [provider]  Multiple Vitamins-Minerals (PRESERVISION AREDS 2 PO) Take 1 tablet by mouth 2 (two) times daily.    [provider]  Omega 3 1000 MG CAPS Take 1,000 mg by mouth 2 (two) times daily.    [provider]  pantoprazole  (PROTONIX ) 20 MG tablet Take 1 tablet (20 mg total) by mouth 2 (two) times daily before a meal. 08/19/24   Craig Alan SAUNDERS, PA-C  pantoprazole  (PROTONIX ) 40 MG tablet Take 1 tablet (40 mg total) by mouth daily. 08/23/24   Craig Alan SAUNDERS, PA-C  polyethylene glycol powder (GLYCOLAX /MIRALAX ) powder Take 17 g by mouth daily.     [provider]  potassium chloride  (KLOR-CON  M) 10 MEQ tablet Take 1 tablet (10 mEq total) by mouth daily. 03/08/24   Court Dorn PARAS, MD  pregabalin  (LYRICA ) 25 MG capsule Take 1 capsule (25 mg total) by mouth 2 (two) times daily. TAKE 1 CAPSULE IN THE MORNING AND TAKE 1 CAPSULE IN THE EVENING Patient taking differently: Take 50 mg by mouth 2 (two) times daily. TAKE 1 CAPSULE IN THE MORNING AND TAKE 2 CAPSULES AT BEDTIME. 01/26/19   Katrinka Garnette KIDD, MD  tiZANidine  (ZANAFLEX ) 2 MG tablet TAKE 1 TABLET (2 MG TOTAL) BY MOUTH AT BEDTIME AS NEEDED. Patient taking differently: Take 2 mg by mouth at bedtime. 05/18/20   Katrinka Garnette KIDD, MD    Physical Exam    Vital Signs:  Tanya Leon does not have vital signs available for  review today.  Given telephonic nature of communication, physical exam is limited. AAOx3. NAD. Normal affect.  Speech and respirations are unlabored.  Accessory Clinical Findings    None  Assessment & Plan    1.  Preoperative Cardiovascular Risk Assessment:  Tanya Leon perioperative risk of a major cardiac event is 0.4% according to the Revised Cardiac Risk Index (RCRI).  Therefore, she is at low risk for perioperative complications.   Her functional capacity is good at 5.07 METs according to the [facility] Activity Status Index (DASI). Recommendations: According to ACC/AHA guidelines, no further cardiovascular testing needed.  The patient may proceed to surgery at acceptable risk.    The patient was advised that if she develops new symptoms prior to surgery to contact our office to arrange for a follow-up visit, and she verbalized understanding.   A copy of this note will be routed to requesting surgeon.  Time:   Today, I have spent 10 minutes with the patient with telehealth technology discussing medical history, symptoms, and management plan.     Tanya LOISE Fabry, PA-C  09/28/2024, 8:53 AM      [1]  Allergies Allergen Reactions   Crab [Shellfish Allergy] Swelling and Other (See Comments)    Soft-shelled crab   Doxycycline Rash   "

## 2024-10-13 ENCOUNTER — Ambulatory Visit: Admitting: Internal Medicine

## 2024-12-06 ENCOUNTER — Ambulatory Visit: Admitting: Cardiovascular Disease

## 2025-01-20 ENCOUNTER — Ambulatory Visit: Admitting: Internal Medicine
